# Patient Record
Sex: Female | Born: 1935 | Race: White | Hispanic: No | Smoking: Never smoker
Health system: Southern US, Community
[De-identification: ages and names within clinical notes are randomized; demographics above are authoritative.]

## PROBLEM LIST (undated history)

## (undated) DIAGNOSIS — R32 Unspecified urinary incontinence: Secondary | ICD-10-CM

## (undated) DIAGNOSIS — E039 Hypothyroidism, unspecified: Secondary | ICD-10-CM

## (undated) DIAGNOSIS — S0230XA Fracture of orbital floor, unspecified side, initial encounter for closed fracture: Secondary | ICD-10-CM

## (undated) DIAGNOSIS — T7840XA Allergy, unspecified, initial encounter: Secondary | ICD-10-CM

## (undated) DIAGNOSIS — Z8619 Personal history of other infectious and parasitic diseases: Secondary | ICD-10-CM

## (undated) DIAGNOSIS — M899 Disorder of bone, unspecified: Secondary | ICD-10-CM

## (undated) DIAGNOSIS — R011 Cardiac murmur, unspecified: Secondary | ICD-10-CM

## (undated) DIAGNOSIS — E538 Deficiency of other specified B group vitamins: Secondary | ICD-10-CM

## (undated) DIAGNOSIS — I059 Rheumatic mitral valve disease, unspecified: Secondary | ICD-10-CM

## (undated) DIAGNOSIS — H269 Unspecified cataract: Secondary | ICD-10-CM

## (undated) DIAGNOSIS — E162 Hypoglycemia, unspecified: Secondary | ICD-10-CM

## (undated) DIAGNOSIS — E785 Hyperlipidemia, unspecified: Secondary | ICD-10-CM

## (undated) DIAGNOSIS — F329 Major depressive disorder, single episode, unspecified: Secondary | ICD-10-CM

## (undated) DIAGNOSIS — L409 Psoriasis, unspecified: Secondary | ICD-10-CM

## (undated) DIAGNOSIS — K219 Gastro-esophageal reflux disease without esophagitis: Secondary | ICD-10-CM

## (undated) DIAGNOSIS — D649 Anemia, unspecified: Secondary | ICD-10-CM

## (undated) DIAGNOSIS — G454 Transient global amnesia: Secondary | ICD-10-CM

## (undated) DIAGNOSIS — I749 Embolism and thrombosis of unspecified artery: Secondary | ICD-10-CM

## (undated) DIAGNOSIS — E559 Vitamin D deficiency, unspecified: Secondary | ICD-10-CM

## (undated) DIAGNOSIS — F32A Depression, unspecified: Secondary | ICD-10-CM

## (undated) DIAGNOSIS — I639 Cerebral infarction, unspecified: Secondary | ICD-10-CM

## (undated) DIAGNOSIS — M7989 Other specified soft tissue disorders: Secondary | ICD-10-CM

## (undated) DIAGNOSIS — M1990 Unspecified osteoarthritis, unspecified site: Secondary | ICD-10-CM

## (undated) DIAGNOSIS — M797 Fibromyalgia: Secondary | ICD-10-CM

## (undated) DIAGNOSIS — D126 Benign neoplasm of colon, unspecified: Secondary | ICD-10-CM

## (undated) DIAGNOSIS — Z8719 Personal history of other diseases of the digestive system: Secondary | ICD-10-CM

## (undated) DIAGNOSIS — M949 Disorder of cartilage, unspecified: Secondary | ICD-10-CM

## (undated) DIAGNOSIS — K589 Irritable bowel syndrome without diarrhea: Secondary | ICD-10-CM

## (undated) HISTORY — PX: EYE SURGERY: SHX253

## (undated) HISTORY — DX: Hypothyroidism, unspecified: E03.9

## (undated) HISTORY — DX: Hyperlipidemia, unspecified: E78.5

## (undated) HISTORY — DX: Fibromyalgia: M79.7

## (undated) HISTORY — DX: Fracture of orbital floor, unspecified side, initial encounter for closed fracture: S02.30XA

## (undated) HISTORY — DX: Gastro-esophageal reflux disease without esophagitis: K21.9

## (undated) HISTORY — DX: Unspecified urinary incontinence: R32

## (undated) HISTORY — DX: Embolism and thrombosis of unspecified artery: I74.9

## (undated) HISTORY — DX: Personal history of other infectious and parasitic diseases: Z86.19

## (undated) HISTORY — DX: Vitamin D deficiency, unspecified: E55.9

## (undated) HISTORY — DX: Deficiency of other specified B group vitamins: E53.8

## (undated) HISTORY — PX: TONSILLECTOMY: SUR1361

## (undated) HISTORY — PX: MOUTH SURGERY: SHX715

## (undated) HISTORY — PX: APPENDECTOMY: SHX54

## (undated) HISTORY — DX: Disorder of bone, unspecified: M89.9

## (undated) HISTORY — DX: Disorder of cartilage, unspecified: M94.9

## (undated) HISTORY — PX: TYMPANOPLASTY: SHX33

## (undated) HISTORY — DX: Unspecified cataract: H26.9

## (undated) HISTORY — PX: JOINT REPLACEMENT: SHX530

## (undated) HISTORY — DX: Cardiac murmur, unspecified: R01.1

## (undated) HISTORY — DX: Benign neoplasm of colon, unspecified: D12.6

## (undated) HISTORY — DX: Allergy, unspecified, initial encounter: T78.40XA

## (undated) HISTORY — DX: Unspecified osteoarthritis, unspecified site: M19.90

## (undated) HISTORY — PX: SPINE SURGERY: SHX786

## (undated) HISTORY — DX: Irritable bowel syndrome, unspecified: K58.9

## (undated) HISTORY — PX: CERVICAL DISC SURGERY: SHX588

---

## 1968-11-26 HISTORY — PX: ABDOMINAL HYSTERECTOMY: SHX81

## 1993-11-26 HISTORY — PX: BREAST BIOPSY: SHX20

## 2000-02-15 ENCOUNTER — Encounter: Admission: RE | Admit: 2000-02-15 | Discharge: 2000-02-15 | Payer: Self-pay | Admitting: Family Medicine

## 2000-02-15 ENCOUNTER — Encounter: Payer: Self-pay | Admitting: Family Medicine

## 2000-12-27 HISTORY — PX: COLONOSCOPY: SHX174

## 2001-01-17 ENCOUNTER — Other Ambulatory Visit: Admission: RE | Admit: 2001-01-17 | Discharge: 2001-01-17 | Payer: Self-pay | Admitting: Gastroenterology

## 2001-01-17 ENCOUNTER — Encounter: Payer: Self-pay | Admitting: Gastroenterology

## 2001-01-17 ENCOUNTER — Encounter (INDEPENDENT_AMBULATORY_CARE_PROVIDER_SITE_OTHER): Payer: Self-pay | Admitting: Specialist

## 2001-02-14 ENCOUNTER — Ambulatory Visit (HOSPITAL_COMMUNITY): Admission: RE | Admit: 2001-02-14 | Discharge: 2001-02-14 | Payer: Self-pay | Admitting: Gastroenterology

## 2001-02-14 ENCOUNTER — Encounter: Payer: Self-pay | Admitting: Gastroenterology

## 2001-02-17 ENCOUNTER — Encounter: Admission: RE | Admit: 2001-02-17 | Discharge: 2001-02-17 | Payer: Self-pay | Admitting: Family Medicine

## 2001-02-17 ENCOUNTER — Encounter: Payer: Self-pay | Admitting: Family Medicine

## 2001-08-26 HISTORY — PX: US TRANSVAGINAL PELVIC MODIFIED: HXRAD721

## 2001-09-02 ENCOUNTER — Encounter: Admission: RE | Admit: 2001-09-02 | Discharge: 2001-09-02 | Payer: Self-pay | Admitting: Family Medicine

## 2001-09-02 ENCOUNTER — Encounter: Payer: Self-pay | Admitting: Family Medicine

## 2001-12-25 ENCOUNTER — Other Ambulatory Visit: Admission: RE | Admit: 2001-12-25 | Discharge: 2001-12-25 | Payer: Self-pay | Admitting: Family Medicine

## 2002-01-07 ENCOUNTER — Encounter: Payer: Self-pay | Admitting: Gastroenterology

## 2002-01-20 ENCOUNTER — Encounter: Payer: Self-pay | Admitting: Family Medicine

## 2002-01-20 ENCOUNTER — Encounter: Admission: RE | Admit: 2002-01-20 | Discharge: 2002-01-20 | Payer: Self-pay | Admitting: Family Medicine

## 2002-01-28 ENCOUNTER — Encounter: Payer: Self-pay | Admitting: Gastroenterology

## 2002-01-28 ENCOUNTER — Ambulatory Visit (HOSPITAL_COMMUNITY): Admission: RE | Admit: 2002-01-28 | Discharge: 2002-01-28 | Payer: Self-pay | Admitting: Gastroenterology

## 2002-04-28 ENCOUNTER — Encounter: Admission: RE | Admit: 2002-04-28 | Discharge: 2002-04-28 | Payer: Self-pay | Admitting: Family Medicine

## 2002-04-28 ENCOUNTER — Encounter: Payer: Self-pay | Admitting: Family Medicine

## 2002-10-20 ENCOUNTER — Encounter: Payer: Self-pay | Admitting: Family Medicine

## 2002-10-20 ENCOUNTER — Encounter: Admission: RE | Admit: 2002-10-20 | Discharge: 2002-10-20 | Payer: Self-pay | Admitting: Family Medicine

## 2002-11-26 HISTORY — PX: CHOLECYSTECTOMY: SHX55

## 2003-06-09 ENCOUNTER — Encounter: Payer: Self-pay | Admitting: Family Medicine

## 2003-06-09 ENCOUNTER — Encounter: Admission: RE | Admit: 2003-06-09 | Discharge: 2003-06-09 | Payer: Self-pay | Admitting: Family Medicine

## 2003-07-02 ENCOUNTER — Encounter: Payer: Self-pay | Admitting: Gastroenterology

## 2003-07-02 ENCOUNTER — Ambulatory Visit (HOSPITAL_COMMUNITY): Admission: RE | Admit: 2003-07-02 | Discharge: 2003-07-02 | Payer: Self-pay | Admitting: Gastroenterology

## 2003-07-06 ENCOUNTER — Ambulatory Visit (HOSPITAL_COMMUNITY): Admission: RE | Admit: 2003-07-06 | Discharge: 2003-07-06 | Payer: Self-pay | Admitting: Gastroenterology

## 2003-07-06 ENCOUNTER — Encounter: Payer: Self-pay | Admitting: Gastroenterology

## 2003-07-14 ENCOUNTER — Encounter: Payer: Self-pay | Admitting: General Surgery

## 2003-07-16 ENCOUNTER — Ambulatory Visit (HOSPITAL_COMMUNITY): Admission: RE | Admit: 2003-07-16 | Discharge: 2003-07-17 | Payer: Self-pay | Admitting: General Surgery

## 2003-07-16 ENCOUNTER — Encounter (INDEPENDENT_AMBULATORY_CARE_PROVIDER_SITE_OTHER): Payer: Self-pay | Admitting: *Deleted

## 2003-07-16 ENCOUNTER — Encounter: Payer: Self-pay | Admitting: General Surgery

## 2004-03-26 HISTORY — PX: OTHER SURGICAL HISTORY: SHX169

## 2004-03-27 ENCOUNTER — Observation Stay (HOSPITAL_COMMUNITY): Admission: RE | Admit: 2004-03-27 | Discharge: 2004-03-28 | Payer: Self-pay | Admitting: Urology

## 2004-06-23 ENCOUNTER — Encounter: Admission: RE | Admit: 2004-06-23 | Discharge: 2004-06-23 | Payer: Self-pay | Admitting: Family Medicine

## 2005-01-25 ENCOUNTER — Ambulatory Visit: Payer: Self-pay | Admitting: Family Medicine

## 2005-01-30 ENCOUNTER — Ambulatory Visit: Payer: Self-pay | Admitting: Family Medicine

## 2005-01-30 ENCOUNTER — Encounter: Admission: RE | Admit: 2005-01-30 | Discharge: 2005-01-30 | Payer: Self-pay | Admitting: Family Medicine

## 2005-02-12 ENCOUNTER — Ambulatory Visit (HOSPITAL_COMMUNITY): Admission: RE | Admit: 2005-02-12 | Discharge: 2005-02-12 | Payer: Self-pay | Admitting: Gastroenterology

## 2005-02-13 ENCOUNTER — Ambulatory Visit: Payer: Self-pay | Admitting: Family Medicine

## 2005-02-15 ENCOUNTER — Ambulatory Visit (HOSPITAL_COMMUNITY): Admission: RE | Admit: 2005-02-15 | Discharge: 2005-02-15 | Payer: Self-pay | Admitting: Gastroenterology

## 2005-05-22 ENCOUNTER — Ambulatory Visit: Payer: Self-pay | Admitting: Family Medicine

## 2005-05-25 ENCOUNTER — Ambulatory Visit: Payer: Self-pay | Admitting: Family Medicine

## 2005-06-08 ENCOUNTER — Ambulatory Visit (HOSPITAL_COMMUNITY): Admission: RE | Admit: 2005-06-08 | Discharge: 2005-06-08 | Payer: Self-pay | Admitting: Family Medicine

## 2005-06-08 ENCOUNTER — Ambulatory Visit: Payer: Self-pay | Admitting: Family Medicine

## 2005-07-03 ENCOUNTER — Encounter: Admission: RE | Admit: 2005-07-03 | Discharge: 2005-07-03 | Payer: Self-pay | Admitting: Family Medicine

## 2005-08-10 ENCOUNTER — Ambulatory Visit: Payer: Self-pay | Admitting: Family Medicine

## 2005-08-15 ENCOUNTER — Ambulatory Visit: Payer: Self-pay | Admitting: Family Medicine

## 2005-09-12 ENCOUNTER — Ambulatory Visit: Payer: Self-pay | Admitting: Family Medicine

## 2005-10-26 ENCOUNTER — Ambulatory Visit: Payer: Self-pay | Admitting: Family Medicine

## 2005-11-14 ENCOUNTER — Ambulatory Visit: Payer: Self-pay | Admitting: Family Medicine

## 2006-02-26 ENCOUNTER — Encounter: Payer: Self-pay | Admitting: Family Medicine

## 2006-02-26 ENCOUNTER — Ambulatory Visit: Payer: Self-pay | Admitting: Family Medicine

## 2006-02-26 ENCOUNTER — Other Ambulatory Visit: Admission: RE | Admit: 2006-02-26 | Discharge: 2006-02-26 | Payer: Self-pay | Admitting: Family Medicine

## 2006-04-16 ENCOUNTER — Ambulatory Visit: Payer: Self-pay | Admitting: Family Medicine

## 2006-07-25 ENCOUNTER — Encounter: Admission: RE | Admit: 2006-07-25 | Discharge: 2006-07-25 | Payer: Self-pay | Admitting: Family Medicine

## 2006-09-10 ENCOUNTER — Ambulatory Visit: Payer: Self-pay | Admitting: Family Medicine

## 2007-02-10 ENCOUNTER — Ambulatory Visit: Payer: Self-pay | Admitting: Family Medicine

## 2007-03-17 ENCOUNTER — Ambulatory Visit: Payer: Self-pay | Admitting: Family Medicine

## 2007-03-17 LAB — CONVERTED CEMR LAB: Vit D, 1,25-Dihydroxy: 31 (ref 20–57)

## 2007-03-18 ENCOUNTER — Encounter: Payer: Self-pay | Admitting: Family Medicine

## 2007-03-18 LAB — CONVERTED CEMR LAB
AST: 19 units/L (ref 0–37)
Albumin: 3.8 g/dL (ref 3.5–5.2)
BUN: 16 mg/dL (ref 6–23)
Basophils Absolute: 0 10*3/uL (ref 0.0–0.1)
Blood Glucose, Fasting: 73 mg/dL
Calcium: 8.9 mg/dL (ref 8.4–10.5)
Chloride: 104 meq/L (ref 96–112)
Creatinine, Ser: 0.8 mg/dL (ref 0.4–1.2)
Free T4: 1 ng/dL (ref 0.6–1.6)
GFR calc non Af Amer: 75 mL/min
Glucose, Bld: 73 mg/dL (ref 70–99)
Hemoglobin: 13.5 g/dL (ref 12.0–15.0)
Lymphocytes Relative: 36 % (ref 12.0–46.0)
MCHC: 35.2 g/dL (ref 30.0–36.0)
Monocytes Absolute: 3.4 10*3/uL — ABNORMAL HIGH (ref 0.2–0.7)
Neutrophils Relative %: 48 % (ref 43.0–77.0)
Platelets: 202 10*3/uL (ref 150–400)
Potassium: 4.1 meq/L (ref 3.5–5.1)
RDW: 11.9 % (ref 11.5–14.6)
Triglycerides: 126 mg/dL (ref 0–149)
VLDL: 25 mg/dL (ref 0–40)

## 2007-04-02 ENCOUNTER — Encounter: Payer: Self-pay | Admitting: Family Medicine

## 2007-07-29 ENCOUNTER — Encounter: Admission: RE | Admit: 2007-07-29 | Discharge: 2007-07-29 | Payer: Self-pay | Admitting: Family Medicine

## 2007-07-31 ENCOUNTER — Encounter (INDEPENDENT_AMBULATORY_CARE_PROVIDER_SITE_OTHER): Payer: Self-pay | Admitting: *Deleted

## 2007-08-04 ENCOUNTER — Encounter: Payer: Self-pay | Admitting: Family Medicine

## 2007-08-04 DIAGNOSIS — M1990 Unspecified osteoarthritis, unspecified site: Secondary | ICD-10-CM | POA: Insufficient documentation

## 2007-08-04 DIAGNOSIS — I749 Embolism and thrombosis of unspecified artery: Secondary | ICD-10-CM | POA: Insufficient documentation

## 2007-08-04 DIAGNOSIS — E039 Hypothyroidism, unspecified: Secondary | ICD-10-CM | POA: Insufficient documentation

## 2007-08-04 DIAGNOSIS — J45909 Unspecified asthma, uncomplicated: Secondary | ICD-10-CM | POA: Insufficient documentation

## 2007-08-04 DIAGNOSIS — IMO0001 Reserved for inherently not codable concepts without codable children: Secondary | ICD-10-CM | POA: Insufficient documentation

## 2007-08-04 DIAGNOSIS — R32 Unspecified urinary incontinence: Secondary | ICD-10-CM | POA: Insufficient documentation

## 2007-08-04 DIAGNOSIS — N952 Postmenopausal atrophic vaginitis: Secondary | ICD-10-CM | POA: Insufficient documentation

## 2007-08-04 DIAGNOSIS — E162 Hypoglycemia, unspecified: Secondary | ICD-10-CM | POA: Insufficient documentation

## 2007-08-04 DIAGNOSIS — K589 Irritable bowel syndrome without diarrhea: Secondary | ICD-10-CM | POA: Insufficient documentation

## 2007-08-06 ENCOUNTER — Ambulatory Visit: Payer: Self-pay | Admitting: Family Medicine

## 2007-09-03 ENCOUNTER — Telehealth: Payer: Self-pay | Admitting: Family Medicine

## 2007-10-06 ENCOUNTER — Telehealth: Payer: Self-pay | Admitting: Family Medicine

## 2007-10-10 ENCOUNTER — Ambulatory Visit: Payer: Self-pay | Admitting: Family Medicine

## 2007-10-24 ENCOUNTER — Ambulatory Visit: Payer: Self-pay | Admitting: Family Medicine

## 2007-11-06 ENCOUNTER — Telehealth: Payer: Self-pay | Admitting: Family Medicine

## 2008-01-21 ENCOUNTER — Telehealth: Payer: Self-pay | Admitting: Family Medicine

## 2008-01-22 ENCOUNTER — Telehealth: Payer: Self-pay | Admitting: Family Medicine

## 2008-02-20 ENCOUNTER — Encounter: Payer: Self-pay | Admitting: Family Medicine

## 2008-03-12 ENCOUNTER — Telehealth: Payer: Self-pay | Admitting: Family Medicine

## 2008-04-13 ENCOUNTER — Telehealth: Payer: Self-pay | Admitting: Family Medicine

## 2008-05-12 ENCOUNTER — Telehealth: Payer: Self-pay | Admitting: Family Medicine

## 2008-06-03 ENCOUNTER — Ambulatory Visit: Payer: Self-pay | Admitting: Family Medicine

## 2008-06-03 DIAGNOSIS — Z87898 Personal history of other specified conditions: Secondary | ICD-10-CM | POA: Insufficient documentation

## 2008-06-30 ENCOUNTER — Telehealth (INDEPENDENT_AMBULATORY_CARE_PROVIDER_SITE_OTHER): Payer: Self-pay | Admitting: *Deleted

## 2008-07-05 ENCOUNTER — Telehealth (INDEPENDENT_AMBULATORY_CARE_PROVIDER_SITE_OTHER): Payer: Self-pay | Admitting: *Deleted

## 2008-07-30 ENCOUNTER — Encounter (INDEPENDENT_AMBULATORY_CARE_PROVIDER_SITE_OTHER): Payer: Self-pay | Admitting: *Deleted

## 2008-08-25 ENCOUNTER — Encounter: Payer: Self-pay | Admitting: Family Medicine

## 2008-08-25 ENCOUNTER — Ambulatory Visit: Payer: Self-pay | Admitting: Family Medicine

## 2008-08-25 ENCOUNTER — Other Ambulatory Visit: Admission: RE | Admit: 2008-08-25 | Discharge: 2008-08-25 | Payer: Self-pay | Admitting: Family Medicine

## 2008-08-25 DIAGNOSIS — E785 Hyperlipidemia, unspecified: Secondary | ICD-10-CM | POA: Insufficient documentation

## 2008-08-25 DIAGNOSIS — M810 Age-related osteoporosis without current pathological fracture: Secondary | ICD-10-CM | POA: Insufficient documentation

## 2008-08-25 DIAGNOSIS — Z86010 Personal history of colonic polyps: Secondary | ICD-10-CM | POA: Insufficient documentation

## 2008-08-30 ENCOUNTER — Encounter: Admission: RE | Admit: 2008-08-30 | Discharge: 2008-08-30 | Payer: Self-pay | Admitting: Family Medicine

## 2008-08-31 LAB — CONVERTED CEMR LAB
ALT: 17 units/L (ref 0–35)
Alkaline Phosphatase: 60 units/L (ref 39–117)
Basophils Absolute: 0 10*3/uL (ref 0.0–0.1)
Basophils Relative: 0.4 % (ref 0.0–3.0)
Bilirubin, Direct: 0.1 mg/dL (ref 0.0–0.3)
CO2: 31 meq/L (ref 19–32)
Creatinine, Ser: 0.9 mg/dL (ref 0.4–1.2)
Direct LDL: 161.4 mg/dL
Eosinophils Relative: 1.9 % (ref 0.0–5.0)
GFR calc Af Amer: 79 mL/min
HDL: 57.5 mg/dL (ref >39.0)
Platelets: 197 10*3/uL (ref 150–400)
RBC: 4.07 M/uL (ref 3.87–5.11)
RDW: 11.9 % (ref 11.5–14.6)
Sodium: 143 meq/L (ref 135–145)
TSH: 1.61 microintl units/mL (ref 0.35–5.50)
Total Protein: 7.4 g/dL (ref 6.0–8.3)

## 2008-09-02 ENCOUNTER — Ambulatory Visit: Payer: Self-pay | Admitting: Family Medicine

## 2008-09-06 LAB — CONVERTED CEMR LAB
OCCULT 1: NEGATIVE
OCCULT 2: NEGATIVE
OCCULT 3: NEGATIVE

## 2008-09-21 ENCOUNTER — Encounter: Payer: Self-pay | Admitting: Family Medicine

## 2008-09-23 ENCOUNTER — Telehealth: Payer: Self-pay | Admitting: Family Medicine

## 2008-09-28 ENCOUNTER — Telehealth: Payer: Self-pay | Admitting: Family Medicine

## 2008-10-21 ENCOUNTER — Encounter: Payer: Self-pay | Admitting: Family Medicine

## 2008-10-29 ENCOUNTER — Ambulatory Visit: Payer: Self-pay | Admitting: Internal Medicine

## 2008-11-02 LAB — CONVERTED CEMR LAB
Albumin: 4.1 g/dL (ref 3.5–5.2)
Amylase: 72 units/L (ref 27–131)
Basophils Relative: 0.5 % (ref 0.0–3.0)
Eosinophils Absolute: 0.2 10*3/uL (ref 0.0–0.7)
Eosinophils Relative: 3.5 % (ref 0.0–5.0)
HCT: 38.9 % (ref 36.0–46.0)
Lipase: 28 units/L (ref 11.0–59.0)
Lymphocytes Relative: 41.3 % (ref 12.0–46.0)
MCHC: 34.6 g/dL (ref 30.0–36.0)
Monocytes Absolute: 0.4 10*3/uL (ref 0.1–1.0)
Neutro Abs: 2.9 10*3/uL (ref 1.4–7.7)
RBC: 4.04 M/uL (ref 3.87–5.11)
RDW: 11.7 % (ref 11.5–14.6)
Total Bilirubin: 0.7 mg/dL (ref 0.3–1.2)
Total Protein: 7.2 g/dL (ref 6.0–8.3)
WBC: 6 10*3/uL (ref 4.5–10.5)

## 2008-11-08 ENCOUNTER — Telehealth: Payer: Self-pay | Admitting: Family Medicine

## 2008-11-08 ENCOUNTER — Ambulatory Visit: Payer: Self-pay | Admitting: Family Medicine

## 2008-11-08 DIAGNOSIS — E559 Vitamin D deficiency, unspecified: Secondary | ICD-10-CM | POA: Insufficient documentation

## 2008-11-09 ENCOUNTER — Ambulatory Visit: Payer: Self-pay | Admitting: Gastroenterology

## 2008-11-09 ENCOUNTER — Encounter: Payer: Self-pay | Admitting: Family Medicine

## 2008-11-09 ENCOUNTER — Telehealth: Payer: Self-pay | Admitting: Gastroenterology

## 2008-11-09 ENCOUNTER — Encounter: Payer: Self-pay | Admitting: Gastroenterology

## 2008-11-09 LAB — CONVERTED CEMR LAB
CO2: 32 meq/L (ref 19–32)
Calcium: 8.9 mg/dL (ref 8.4–10.5)
Chloride: 102 meq/L (ref 96–112)

## 2008-11-12 ENCOUNTER — Ambulatory Visit (HOSPITAL_COMMUNITY): Admission: RE | Admit: 2008-11-12 | Discharge: 2008-11-12 | Payer: Self-pay | Admitting: Gastroenterology

## 2008-11-22 ENCOUNTER — Ambulatory Visit: Payer: Self-pay | Admitting: Gastroenterology

## 2008-11-22 ENCOUNTER — Ambulatory Visit (HOSPITAL_COMMUNITY): Admission: RE | Admit: 2008-11-22 | Discharge: 2008-11-22 | Payer: Self-pay | Admitting: Gastroenterology

## 2008-11-25 ENCOUNTER — Telehealth: Payer: Self-pay | Admitting: Family Medicine

## 2008-12-03 ENCOUNTER — Telehealth: Payer: Self-pay | Admitting: Family Medicine

## 2008-12-09 ENCOUNTER — Ambulatory Visit: Payer: Self-pay | Admitting: Gastroenterology

## 2009-02-01 ENCOUNTER — Telehealth: Payer: Self-pay | Admitting: Family Medicine

## 2009-02-14 ENCOUNTER — Encounter: Payer: Self-pay | Admitting: Family Medicine

## 2009-02-15 ENCOUNTER — Telehealth: Payer: Self-pay | Admitting: Family Medicine

## 2009-03-24 ENCOUNTER — Ambulatory Visit (HOSPITAL_BASED_OUTPATIENT_CLINIC_OR_DEPARTMENT_OTHER): Admission: RE | Admit: 2009-03-24 | Discharge: 2009-03-24 | Payer: Self-pay | Admitting: Urology

## 2009-04-06 ENCOUNTER — Encounter: Payer: Self-pay | Admitting: Family Medicine

## 2009-05-04 ENCOUNTER — Telehealth: Payer: Self-pay | Admitting: Family Medicine

## 2009-05-25 ENCOUNTER — Telehealth: Payer: Self-pay | Admitting: Family Medicine

## 2009-07-05 ENCOUNTER — Telehealth: Payer: Self-pay | Admitting: Family Medicine

## 2009-08-10 ENCOUNTER — Encounter: Payer: Self-pay | Admitting: Family Medicine

## 2009-08-29 ENCOUNTER — Ambulatory Visit: Payer: Self-pay | Admitting: Family Medicine

## 2009-08-29 DIAGNOSIS — R5383 Other fatigue: Secondary | ICD-10-CM

## 2009-08-29 DIAGNOSIS — R5381 Other malaise: Secondary | ICD-10-CM | POA: Insufficient documentation

## 2009-09-02 ENCOUNTER — Encounter: Admission: RE | Admit: 2009-09-02 | Discharge: 2009-09-02 | Payer: Self-pay | Admitting: Family Medicine

## 2009-09-02 ENCOUNTER — Encounter (INDEPENDENT_AMBULATORY_CARE_PROVIDER_SITE_OTHER): Payer: Self-pay | Admitting: *Deleted

## 2009-09-02 ENCOUNTER — Encounter: Payer: Self-pay | Admitting: Family Medicine

## 2009-09-02 LAB — CONVERTED CEMR LAB
AST: 20 units/L (ref 0–37)
Albumin: 3.8 g/dL (ref 3.5–5.2)
Basophils Relative: 0.1 % (ref 0.0–3.0)
Bilirubin, Direct: 0.1 mg/dL (ref 0.0–0.3)
CO2: 30 meq/L (ref 19–32)
Calcium: 8.6 mg/dL (ref 8.4–10.5)
Chloride: 109 meq/L (ref 96–112)
Eosinophils Absolute: 0.2 10*3/uL (ref 0.0–0.7)
Eosinophils Relative: 3.6 % (ref 0.0–5.0)
LDL Cholesterol: 106 mg/dL — ABNORMAL HIGH (ref 0–99)
Lymphocytes Relative: 53.2 % — ABNORMAL HIGH (ref 12.0–46.0)
MCHC: 33.1 g/dL (ref 30.0–36.0)
Neutro Abs: 2.3 10*3/uL (ref 1.4–7.7)
Neutrophils Relative %: 37 % — ABNORMAL LOW (ref 43.0–77.0)
Platelets: 167 10*3/uL (ref 150.0–400.0)
RBC: 3.78 M/uL — ABNORMAL LOW (ref 3.87–5.11)
Sodium: 141 meq/L (ref 135–145)
Total Bilirubin: 0.6 mg/dL (ref 0.3–1.2)
Total CHOL/HDL Ratio: 3
VLDL: 14.6 mg/dL (ref 0.0–40.0)
Vitamin B-12: 266 pg/mL (ref 211–911)

## 2009-09-05 ENCOUNTER — Encounter (INDEPENDENT_AMBULATORY_CARE_PROVIDER_SITE_OTHER): Payer: Self-pay | Admitting: *Deleted

## 2009-09-05 ENCOUNTER — Ambulatory Visit: Payer: Self-pay | Admitting: Family Medicine

## 2009-09-05 DIAGNOSIS — E538 Deficiency of other specified B group vitamins: Secondary | ICD-10-CM | POA: Insufficient documentation

## 2009-09-05 LAB — CONVERTED CEMR LAB: Vit D, 25-Hydroxy: 28 ng/mL — ABNORMAL LOW (ref 30–89)

## 2009-09-07 ENCOUNTER — Encounter: Payer: Self-pay | Admitting: Family Medicine

## 2009-09-08 ENCOUNTER — Encounter: Admission: RE | Admit: 2009-09-08 | Discharge: 2009-09-08 | Payer: Self-pay | Admitting: Family Medicine

## 2009-09-12 ENCOUNTER — Ambulatory Visit: Payer: Self-pay | Admitting: Family Medicine

## 2009-09-13 ENCOUNTER — Encounter (INDEPENDENT_AMBULATORY_CARE_PROVIDER_SITE_OTHER): Payer: Self-pay | Admitting: *Deleted

## 2009-09-19 ENCOUNTER — Ambulatory Visit: Payer: Self-pay | Admitting: Family Medicine

## 2009-09-27 ENCOUNTER — Ambulatory Visit: Payer: Self-pay | Admitting: Family Medicine

## 2009-09-29 ENCOUNTER — Encounter: Payer: Self-pay | Admitting: Family Medicine

## 2009-10-14 ENCOUNTER — Ambulatory Visit: Payer: Self-pay | Admitting: Family Medicine

## 2009-10-24 LAB — CONVERTED CEMR LAB: Vitamin B-12: 539 pg/mL (ref 211–911)

## 2009-10-31 ENCOUNTER — Telehealth: Payer: Self-pay | Admitting: Family Medicine

## 2009-11-01 ENCOUNTER — Ambulatory Visit: Payer: Self-pay | Admitting: Family Medicine

## 2009-11-03 ENCOUNTER — Telehealth: Payer: Self-pay | Admitting: Family Medicine

## 2009-11-22 ENCOUNTER — Ambulatory Visit: Payer: Self-pay | Admitting: Family Medicine

## 2009-12-12 ENCOUNTER — Telehealth: Payer: Self-pay | Admitting: Family Medicine

## 2009-12-23 ENCOUNTER — Ambulatory Visit: Payer: Self-pay | Admitting: Family Medicine

## 2010-01-13 ENCOUNTER — Telehealth: Payer: Self-pay | Admitting: Family Medicine

## 2010-01-23 ENCOUNTER — Encounter: Payer: Self-pay | Admitting: Family Medicine

## 2010-01-24 ENCOUNTER — Ambulatory Visit: Payer: Self-pay | Admitting: Family Medicine

## 2010-02-14 ENCOUNTER — Encounter: Payer: Self-pay | Admitting: Family Medicine

## 2010-02-24 ENCOUNTER — Ambulatory Visit: Payer: Self-pay | Admitting: Family Medicine

## 2010-03-06 ENCOUNTER — Telehealth: Payer: Self-pay | Admitting: Family Medicine

## 2010-03-28 ENCOUNTER — Ambulatory Visit: Payer: Self-pay | Admitting: Family Medicine

## 2010-04-12 ENCOUNTER — Ambulatory Visit: Payer: Self-pay | Admitting: Family Medicine

## 2010-04-12 DIAGNOSIS — R634 Abnormal weight loss: Secondary | ICD-10-CM | POA: Insufficient documentation

## 2010-04-13 LAB — CONVERTED CEMR LAB
ALT: 15 units/L (ref 0–35)
AST: 18 units/L (ref 0–37)
Albumin: 4.2 g/dL (ref 3.5–5.2)
Basophils Relative: 1 % (ref 0.0–3.0)
CO2: 30 meq/L (ref 19–32)
Eosinophils Absolute: 0.2 10*3/uL (ref 0.0–0.7)
Eosinophils Relative: 2.9 % (ref 0.0–5.0)
Lymphocytes Relative: 39.1 % (ref 12.0–46.0)
Lymphs Abs: 2.4 10*3/uL (ref 0.7–4.0)
MCHC: 34.4 g/dL (ref 30.0–36.0)
MCV: 98.1 fL (ref 78.0–100.0)
Monocytes Absolute: 0.2 10*3/uL (ref 0.1–1.0)
Monocytes Relative: 3.9 % (ref 3.0–12.0)
Neutro Abs: 3.2 10*3/uL (ref 1.4–7.7)
Neutrophils Relative %: 53.1 % (ref 43.0–77.0)
RBC: 4.07 M/uL (ref 3.87–5.11)
Total Protein: 6.9 g/dL (ref 6.0–8.3)
WBC: 6.1 10*3/uL (ref 4.5–10.5)

## 2010-04-14 ENCOUNTER — Ambulatory Visit: Payer: Self-pay | Admitting: Family Medicine

## 2010-05-09 ENCOUNTER — Ambulatory Visit: Payer: Self-pay | Admitting: Family Medicine

## 2010-05-09 ENCOUNTER — Encounter: Admission: RE | Admit: 2010-05-09 | Discharge: 2010-05-09 | Payer: Self-pay | Admitting: Family Medicine

## 2010-05-11 ENCOUNTER — Telehealth: Payer: Self-pay | Admitting: Family Medicine

## 2010-05-18 ENCOUNTER — Ambulatory Visit: Payer: Self-pay | Admitting: Gastroenterology

## 2010-06-08 ENCOUNTER — Ambulatory Visit: Payer: Self-pay | Admitting: Gastroenterology

## 2010-06-12 ENCOUNTER — Encounter: Payer: Self-pay | Admitting: Gastroenterology

## 2010-06-13 ENCOUNTER — Ambulatory Visit: Payer: Self-pay | Admitting: Family Medicine

## 2010-07-06 ENCOUNTER — Ambulatory Visit: Payer: Self-pay | Admitting: Family Medicine

## 2010-07-14 ENCOUNTER — Ambulatory Visit: Payer: Self-pay | Admitting: Family Medicine

## 2010-07-28 ENCOUNTER — Encounter: Payer: Self-pay | Admitting: Family Medicine

## 2010-08-03 ENCOUNTER — Encounter: Payer: Self-pay | Admitting: Family Medicine

## 2010-08-03 ENCOUNTER — Ambulatory Visit (HOSPITAL_COMMUNITY): Admission: RE | Admit: 2010-08-03 | Discharge: 2010-08-03 | Payer: Self-pay | Admitting: Obstetrics and Gynecology

## 2010-08-14 ENCOUNTER — Telehealth: Payer: Self-pay | Admitting: Family Medicine

## 2010-08-17 ENCOUNTER — Ambulatory Visit: Payer: Self-pay | Admitting: Family Medicine

## 2010-08-22 ENCOUNTER — Ambulatory Visit: Payer: Self-pay | Admitting: Family Medicine

## 2010-09-13 ENCOUNTER — Telehealth (INDEPENDENT_AMBULATORY_CARE_PROVIDER_SITE_OTHER): Payer: Self-pay | Admitting: *Deleted

## 2010-09-13 ENCOUNTER — Ambulatory Visit: Payer: Self-pay | Admitting: Family Medicine

## 2010-09-13 LAB — CONVERTED CEMR LAB
Basophils Absolute: 0 10*3/uL (ref 0.0–0.1)
Basophils Relative: 0.8 % (ref 0.0–3.0)
CO2: 31 meq/L (ref 19–32)
Chloride: 109 meq/L (ref 96–112)
Cholesterol: 193 mg/dL (ref 0–200)
Creatinine, Ser: 0.7 mg/dL (ref 0.4–1.2)
Glucose, Bld: 71 mg/dL (ref 70–99)
HDL: 63.4 mg/dL (ref >39.00)
LDL Cholesterol: 120 mg/dL — ABNORMAL HIGH (ref 0–99)
Lymphocytes Relative: 39.5 % (ref 12.0–46.0)
RDW: 12.8 % (ref 11.5–14.6)
Sodium: 145 meq/L (ref 135–145)
Total CHOL/HDL Ratio: 3
VLDL: 10 mg/dL (ref 0.0–40.0)

## 2010-09-14 LAB — CONVERTED CEMR LAB: Vit D, 25-Hydroxy: 37 ng/mL (ref 30–89)

## 2010-09-20 ENCOUNTER — Ambulatory Visit: Payer: Self-pay | Admitting: Family Medicine

## 2010-09-25 ENCOUNTER — Encounter: Admission: RE | Admit: 2010-09-25 | Discharge: 2010-09-25 | Payer: Self-pay | Admitting: Obstetrics and Gynecology

## 2010-10-09 ENCOUNTER — Telehealth: Payer: Self-pay | Admitting: Family Medicine

## 2010-10-24 ENCOUNTER — Telehealth: Payer: Self-pay | Admitting: Family Medicine

## 2010-11-08 ENCOUNTER — Telehealth: Payer: Self-pay | Admitting: Family Medicine

## 2010-12-17 ENCOUNTER — Encounter: Payer: Self-pay | Admitting: Pulmonary Disease

## 2010-12-18 ENCOUNTER — Telehealth: Payer: Self-pay | Admitting: Family Medicine

## 2010-12-28 NOTE — Assessment & Plan Note (Signed)
Summary: tower b12/rbh  Nurse Visit   Allergies: 1)  ! * Vitamin D 50,000 Iu 2)  * Detrol 3)  Pamelor 4)  Morphine 5)  * Alcohol  Medication Administration  Injection # 1:    Medication: Vit B12 1000 mcg    Diagnosis: B12 DEFICIENCY (ICD-266.2)    Route: IM    Site: L deltoid    Exp Date: 09/27/2011    Lot #: [id number]    Mfr: American Regent    Patient tolerated injection without complications    Given by: Linde Gillis CMA Duncan Dull) (Mar 28, 2010 11:36 AM)  Orders Added: 1)  Vit B12 1000 mcg [J3420] 2)  Admin of Therapeutic Inj  intramuscular or subcutaneous [19147]

## 2010-12-28 NOTE — Procedures (Signed)
Summary: Colonoscopy  Patient: Carla Kelly Note: All result statuses are Final unless otherwise noted.  Tests: (1) Colonoscopy (COL)   COL Colonoscopy           DONE     [facility]     [address]     [place], [place]  [address]           COLONOSCOPY PROCEDURE REPORT     PATIENT:  Carla Kelly, Carla Kelly  MR#:  [id number]     BIRTHDATE:  1933-01-19, 77 yrs. old  GENDER:  female     ENDOSCOPIST:  Judie Petit T. Russella Dar, MD, [facility]           PROCEDURE DATE:  06/08/2010     PROCEDURE:  Colonoscopy with snare polypectomy     ASA CLASS:  Class II     INDICATIONS:  1) hematochezia  2) weight loss  3) follow-up of     polyp, adenomatous polyp, 12/2000.     MEDICATIONS:   Fentanyl 125 mcg IV, Versed 12 mg IV     DESCRIPTION OF PROCEDURE:After the risks benefits and alternatives     of the procedure were thoroughly explained, informed consent was     obtained.  Digital rectal exam was performed and revealed no     abnormalities.  The LB [id number]-[id number] endoscope was introduced     through the anus and advanced to the cecum, which was identified     by both the appendix and ileocecal valve, limited by a tortuous     and redundant colon. Diffcult to reach cecum. he quality of the     prep was excellent, using MoviPrep. The instrument was then slowly     withdrawn as the colon was fully examined.     <<PROCEDUREIMAGES>>     FINDINGS:  A sessile polyp was found in the ascending colon. It     was 5 mm in size. Polyp was snared without cautery. Retrieval was     successful.  Two polyps were found in the proximal transverse     colon. They were 4 - 5 mm in size. Polyps were snared without     cautery. Retrieval was successful. This was otherwise a normal     examination of the colon. Retroflexed views in the rectum revealed     no abnormalities. The time to cecum =  11  minutes. The scope was     then withdrawn (time =  11  min) from the patient and the     procedure completed.        COMPLICATIONS:  None           ENDOSCOPIC IMPRESSION:     1) 5 mm sessile polyp in the ascending colon     2) 4 - 5 mm, two polyps in the proximal transverse colon           RECOMMENDATIONS:     1) Await pathology results     2) Repeat Colonoscopy in 5 years pending pathology review     3) If wt loss persists further evaluated for malabsoption and     nonGI causes of wt loss           Artisha Capri T. Russella Dar, MD, [facility]           CC: Judy Pimple, MD           n.     Rosalie DoctorJudie Petit T. Trygg Mantz at 06/08/2010 02:10 PM  Carla Kelly, Carla Kelly, [id number]  Note: An exclamation mark (!) indicates a result that was not dispersed into the flowsheet. Document Creation Date: 06/08/2010 2:11 PM _______________________________________________________________________  (1) Order result status: Final Collection or observation date-time: 06/08/2010 14:03 Requested date-time:  Receipt date-time:  Reported date-time:  Referring Physician:   Ordering Physician: Claudette Head [phone]) Specimen Source:  Source: Launa Grill Order Number: [id number] Lab site:   Appended Document: Colonoscopy     Procedures Next Due Date:    Colonoscopy: 05/2015

## 2010-12-28 NOTE — Progress Notes (Signed)
Summary: refill request for diazepam  Phone Note Refill Request Message from:  Fax from Pharmacy  Refills Requested: Medication #1:  DIAZEPAM 5 MG  TABS one by mouth three times a day as needed   Last Refilled: 09/30/2010 [facility] [address]  [phone].  Initial call taken by: Lowella Petties CMA, AAMA,  November 08, 2010 10:35 AM  Follow-up for Phone Call        I did 90 with 3 ref on 11/29?   Follow-up by: Judith Part MD,  November 08, 2010 11:22 AM  Additional Follow-up for Phone Call Additional follow up Details #1::        Spoke with Joni Reining at [facility] and they do have refills sent 09/2010.Lewanda Rife LPN  November 08, 2010 11:30 AM

## 2010-12-28 NOTE — Assessment & Plan Note (Signed)
Summary: FLU SHOT/JRR  Nurse Visit   Allergies: 1)  ! Detrol (Tolterodine Tartrate) 2)  ! Vitamin D (Ergocalciferol) (Ergocalciferol) 3)  Pamelor 4)  Morphine 5)  * Alcohol  Immunizations Administered:  Influenza Vaccine # 1:    Vaccine Type: Fluvax MCR    Site: left deltoid    Mfr: GlaxoSmithKline    Dose: 0.5 ml    Route: IM    Given by: Mervin Hack CMA (AAMA)    Exp. Date: 05/26/2011    Lot #: [id number]    VIS given: 06/20/10 version given August 22, 2010.  Flu Vaccine Consent Questions:    Do you have a history of severe allergic reactions to this vaccine? no    Any prior history of allergic reactions to egg and/or gelatin? no    Do you have a sensitivity to the preservative Thimersol? no    Do you have a past history of Guillan-Barre Syndrome? no    Do you currently have an acute febrile illness? no    Have you ever had a severe reaction to latex? no    Vaccine information given and explained to patient? yes    Are you currently pregnant? no  Orders Added: 1)  Influenza Vaccine MCR [00025]

## 2010-12-28 NOTE — Progress Notes (Signed)
Summary: hydrocodone  Phone Note Refill Request Message from:  Fax from Pharmacy on December 18, 2010 10:25 AM  Refills Requested: Medication #1:  HYDROCODONE-ACETAMINOPHEN 10-650 MG  TABS 1 by mouth two times a day as needed pain   Last Refilled: 11/08/2010 [facility] [phone]  Initial call taken by: Melody Comas,  December 18, 2010 10:25 AM  Follow-up for Phone Call        px written on EMR for call in  Follow-up by: Judith Part MD,  December 18, 2010 1:22 PM  Additional Follow-up for Phone Call Additional follow up Details #1::        [facility] as instructed. Lewanda Rife LPN  December 18, 2010 2:05 PM     Prescriptions: HYDROCODONE-ACETAMINOPHEN 10-650 MG  TABS (HYDROCODONE-ACETAMINOPHEN) 1 by mouth two times a day as needed pain  #60 x 0   Entered and Authorized by:   Judith Part MD   Signed by:   Lewanda Rife LPN on 13/24/4010   Method used:   Telephoned to ...       [facility] (retail)       [address]       [place], [place]  [address]       Ph: [phone] or [phone]       Fax: [phone]   RxID:   [id number]

## 2010-12-28 NOTE — Miscellaneous (Signed)
Summary: CONTROLLED SUBSTANCES CONTRACT  CONTROLLED [employer]ANCES CONTRACT   Imported By: Wilder Glade 07/12/2010 12:45:10  _____________________________________________________________________  External Attachment:    Type:   Image     Comment:   External Document

## 2010-12-28 NOTE — Letter (Signed)
Summary: Patient Notice- Polyp Results  [facility]  [address] [place], [place] [address]   Phone: [phone]  Fax: [phone]        June 12, 2010 MRN: [id number]    Carla Kelly [address] [place], [place]  [address]    Dear Ms. Hedglin,  I am pleased to inform you that the colon polyp(s) removed during your recent colonoscopy was (were) found to be benign (no cancer detected) upon pathologic examination.  I recommend you have a repeat colonoscopy examination in 5 years to look for recurrent polyps, as having colon polyps increases your risk for having recurrent polyps or even colon cancer in the future.  Should you develop new or worsening symptoms of abdominal pain, bowel habit changes or bleeding from the rectum or bowels, please schedule an evaluation with either your primary care physician or with me.  Continue treatment plan as outlined the day of your exam.  Please call us if you are having persistent problems or have questions about your condition that have not been fully answered at this time.  Sincerely,  Meryl Dare MD [facility]  This letter has been electronically signed by your physician.  Appended Document: Patient Notice- Polyp Results letter mailed.

## 2010-12-28 NOTE — Letter (Signed)
Summary: [facility]   Imported By: Lanelle Bal 02/28/2010 13:50:31  _____________________________________________________________________  External Attachment:    Type:   Image     Comment:   External Document

## 2010-12-28 NOTE — Progress Notes (Signed)
Summary: refill request for diazepam  Phone Note Refill Request Message from:  Fax from Pharmacy  Refills Requested: Medication #1:  DIAZEPAM 5 MG  TABS one by mouth three times a day prn   Last Refilled: 12/16/2009 [facility]axed request from [facility] [address], [phone].  Initial call taken by: Lowella Petties CMA,  March 06, 2010 8:22 AM  Follow-up for Phone Call        px written on EMR for call in  Follow-up by: Judith Part MD,  March 06, 2010 9:35 AM  Additional Follow-up for Phone Call Additional follow up Details #1::        Medication [facility]oned [facility]  [facility] as instructed. Lewanda Rife LPN  March 06, 2010 3:54 PM     New/Updated Medications: DIAZEPAM 5 MG  TABS (DIAZEPAM) one by mouth three times a day as needed Prescriptions: DIAZEPAM 5 MG  TABS (DIAZEPAM) one by mouth three times a day as needed  #90 x 3   Entered and Authorized by:   Judith Part MD   Signed by:   Lewanda Rife LPN on 56/21/3086   Method used:   Telephoned to ...       [facility]  [place] (retail)       [address]       [place], [place]  [address]       Ph: [phone] or [phone]       Fax: [phone]   RxID:   [id number]

## 2010-12-28 NOTE — Progress Notes (Signed)
Summary: refill request for albuterol  Phone Note Refill Request Message from:  Fax from Pharmacy  Refills Requested: Medication #1:  ALBUTEROL SULFATE 4 MG TABS 1 by mouth q 4 hours as needed   Last Refilled: 06/17/2009 Faxed request from [facility] [address].  Initial call taken by: Lowella Petties CMA,  December 12, 2009 3:22 PM  Follow-up for Phone Call        px written on EMR for call in  Follow-up by: Judith Part MD,  December 12, 2009 3:35 PM    New/Updated Medications: ALBUTEROL SULFATE 4 MG TABS (ALBUTEROL SULFATE) 1 by mouth q 4 hours as needed Prescriptions: ALBUTEROL SULFATE 4 MG TABS (ALBUTEROL SULFATE) 1 by mouth q 4 hours as needed  #120 x 5   Entered by:   Lowella Petties CMA   Authorized by:   Judith Part MD   Signed by:   Lowella Petties CMA on 12/12/2009   Method used:   Electronically to        [facility]  [place] (retail)       [address]       [place], [place]  [address]       Ph: [phone] or [phone]       Fax: [phone]   RxID:   [id number] ALBUTEROL SULFATE 4 MG TABS (ALBUTEROL SULFATE) 1 by mouth q 4 hours as needed  #120 x 5   Entered and Authorized by:   Judith Part MD   Signed by:   Judith Part MD on 12/12/2009   Method used:   Telephoned to ...       [facility]  [place] (retail)       [address]       [place], [place]  [address]       Ph: [phone] or [phone]       Fax: [phone]   RxID:   [id number]

## 2010-12-28 NOTE — Letter (Signed)
Summary: [facility] Instructions  [facility]  [address] [place], [place] [address]   Phone: [phone]  Fax: [phone]       Carla Kelly    1932/12/27    MRN: [id number]        Procedure Day /Date: Thursday July 14th, 2011     Arrival Time: 12:30pm     Procedure Time: 1:30pm     Location of Procedure:                    _x _   [facility]oscopy Center (4th Floor)                        PREPARATION FOR COLONOSCOPY WITH MOVIPREP   Starting 5 days prior to your procedure 06/03/10 do not eat nuts, seeds, popcorn, corn, beans, peas,  salads, or any raw vegetables.  Do not take any fiber supplements (e.g. Metamucil, Citrucel, and Benefiber).  THE DAY BEFORE YOUR PROCEDURE         DATE: 06/07/10  DAY: Wednesday  1.  Drink clear liquids the entire day-NO SOLID FOOD  2.  Do not drink anything colored red or purple.  Avoid juices with pulp.  No orange juice.  3.  Drink at least 64 oz. (8 glasses) of fluid/clear liquids during the day to prevent dehydration and help the prep work efficiently.  CLEAR LIQUIDS INCLUDE: Water Jello Ice Popsicles Tea (sugar ok, no milk/cream) Powdered fruit flavored drinks Coffee (sugar ok, no milk/cream) Gatorade Juice: apple, white grape, white cranberry  Lemonade Clear bullion, consomm, broth Carbonated beverages (any kind) Strained chicken noodle soup Hard Candy                             4.  In the morning, mix first dose of MoviPrep solution:    Empty 1 Pouch A and 1 Pouch B into the disposable container    Add lukewarm drinking water to the top line of the container. Mix to dissolve    Refrigerate (mixed solution should be used within 24 hrs)  5.  Begin drinking the prep at 5:00 p.m. The MoviPrep container is divided by 4 marks.   Every 15 minutes drink the solution down to the next mark (approximately 8 oz) until the full liter is complete.   6.  Follow completed prep with 16 oz of clear liquid of your choice  (Nothing red or purple).  Continue to drink clear liquids until bedtime.  7.  Before going to bed, mix second dose of MoviPrep solution:    Empty 1 Pouch A and 1 Pouch B into the disposable container    Add lukewarm drinking water to the top line of the container. Mix to dissolve    Refrigerate  THE DAY OF YOUR PROCEDURE      DATE: 06/08/10 DAY: Thursday  Beginning at 8:30 a.m. (5 hours before procedure):         1. Every 15 minutes, drink the solution down to the next mark (approx 8 oz) until the full liter is complete.  2. Follow completed prep with 16 oz. of clear liquid of your choice.    3. You may drink clear liquids until 11:30am (2 HOURS BEFORE PROCEDURE).   MEDICATION INSTRUCTIONS  Unless otherwise instructed, you should take regular prescription medications with a small sip of water   as early as possible the morning of your  procedure.         OTHER INSTRUCTIONS  You will need a responsible adult at least 75 years of age to accompany you and drive you home.   This person must remain in the waiting room during your procedure.  Wear loose fitting clothing that is easily removed.  Leave jewelry and other valuables at home.  However, you may wish to bring a book to read or  an iPod/MP3 player to listen to music as you wait for your procedure to start.  Remove all body piercing jewelry and leave at home.  Total time from sign-in until discharge is approximately 2-3 hours.  You should go home directly after your procedure and rest.  You can resume normal activities the  day after your procedure.  The day of your procedure you should not:   Drive   Make legal decisions   Operate machinery   Drink alcohol   Return to work  You will receive specific instructions about eating, activities and medications before you leave.    The above instructions have been reviewed and explained to me by   Estill Bamberg.     I fully understand and can verbalize these  instructions _____________________________ Date _________

## 2010-12-28 NOTE — Assessment & Plan Note (Signed)
Summary: B-12/TOWER/JRR  Nurse Visit   Allergies: 1)  ! Detrol (Tolterodine Tartrate) 2)  ! Vitamin D (Ergocalciferol) (Ergocalciferol) 3)  Pamelor 4)  Morphine 5)  * Alcohol  Medication Administration  Injection # 1:    Medication: Vit B12 1000 mcg    Diagnosis: B12 DEFICIENCY (ICD-266.2)    Route: IM    Site: L deltoid    Exp Date: 02/25/2012    Lot #: [id number]    Mfr: American Regent    Patient tolerated injection without complications    Given by: Lewanda Rife LPN (August 17, 2010 3:11 PM)  Orders Added: 1)  Admin of Therapeutic Inj  intramuscular or subcutaneous [96372] 2)  Vit B12 1000 mcg [J3420]

## 2010-12-28 NOTE — Assessment & Plan Note (Signed)
Summary: Gastroenterology  [place]  MR#:  [id number] Page #  [facility]   GASTROENTEROLOGY OFFICE NOTE  NAME:  Carla Kelly, Carla Kelly   OFFICE NO:  [id number]  DATE:  01/07/02   REQUESTING PHYSICIAN:  Dr. Roxy Manns  CHIEF COMPLAINT:  The patient is a 77 year old white female that I have previously seen for colonoscopy and colon polyps.  She did have a history of diarrhea, abdominal pain and bloating.  A descending colon polyp was removed.  Her colonoscopy was only complete to the proximal transverse colon.  Subsequent barium enema revealed a question of a polyp in the proximal transverse colon.  It was described as a 5 mm filling defect with features consistent with either a polyp or retained stool.  The remainder of the exam was felt to be unremarkable except for moderate redundancy of the colon and a high-riding cecum that extended across the midline to the left.  Some reflux into the terminal ileum was noted.  She relates the onset of right lower back pain and right inguinal region pain beginning after lifting her husband up from a fall in late December.  She has been using pain medications and heating pads with some improvement in her symptoms.  Her left lower back pain has improved more than her right lower quadrant pain.  There has been no change in bowel habits.  The pain is not related to any digestive function.  She specifically notes no melena, hematochezia, weight loss or change in stool caliber.   MEDICATIONS:  As listed on the chart, updated and reviewed.  PAST MEDICAL HISTORY:  Status post abdominal hysterectomy with a right oophorectomy.  Her left ovary remains.  History of irritable bowel syndrome.  Status post appendectomy at age 13.  Hypothyroidism.  History of depression.  History of asthma.    PHYSICAL EXAMINATION:  GENERAL:  In no acute distress.  VITAL SIGNS:  Weight 162 pounds.  Blood pressure 124/74.  Pulse 72 and regular.  HEENT:  Anicteric sclerae.  Oropharynx clear.  CHEST:   Clear to auscultation and percussion.  CARDIAC:  Regular rate and rhythm without murmurs.  ABDOMEN:  Soft with minimal right lower quadrant tenderness that extends into the right inguinal region.  The tenderness is located specifically at the appendectomy incision.  No hernias are appreciated.  Normoactive bowel sounds.  No palpable organomegaly or masses.  RECTAL:  Examination deferred.  BACK:  Exam reveals tenderness in the right lumbar paraspinal region.  No costovertebral angle tenderness.  ASSESSMENT/PLAN:  1.   Prior history of an adenomatous colon polyp.  Possible polyp in the proximal transverse colon noted on barium enema.  Given her prior difficulties with colonoscopy likely due to her redundant colon, we will proceed with an air contrasted barium enema.  If the filling defect persists, will need colonoscopy.   2.   Right-sided back pain and right lower quadrant abdominal pain with right inguinal pain.  These symptoms appear to be musculoskeletal in etiology and I defer further treatment to Dr. Milinda Antis.     Venita Lick. Russella Dar, M.D., F.A.C.G.  [id number] cc:  Dr. Milinda Antis D:  01/07/02; T:  ; Job # [id number]

## 2010-12-28 NOTE — Progress Notes (Signed)
----   Converted from flag ---- ---- 09/12/2010 9:51 PM, Judith Part MD wrote: please check lipid/ast/alt/renal/ cbc with diff/ tsh / vit d for 272, 244.9, gerd   ---- 09/12/2010 11:37 AM, Liane Comber CMA (AAMA) wrote: Lab orders please! Good Morning! This pt is scheduled for cpx labs tomorrow, which labs to draw and dx codes to use? Thanks Tasha ------------------------------

## 2010-12-28 NOTE — Consult Note (Signed)
Summary: [facility]   Imported By: Sherian Rein 08/12/2010 08:41:53  _____________________________________________________________________  External Attachment:    Type:   Image     Comment:   External Document

## 2010-12-28 NOTE — Assessment & Plan Note (Signed)
Summary: CPX/JRR   Vital Signs:  Patient profile:   77 year old female Height:      66 inches Weight:      135.50 pounds BMI:     21.95 Temp:     97.9 degrees F oral Pulse rate:   80 / minute Pulse rhythm:   regular BP sitting:   116 / 60  (left arm) Cuff size:   regular  Vitals Entered By: Lewanda Rife LPN (September 20, 2010 10:36 AM) CC: check up    History of Present Illness: here for check up of chronic med problems and also to disc health mt list   worse asthma this week  environmental and weather   wt is stable  good bp 116/60  thyroid-- tsh is theraputic feels like that is just fine  is on generic for synthroid   lipids up a bit with good hdl and LDL 120 from 106 last year  has been eating too much ice cream and eggs to try to gain weight  is gaining at home   partial hyst past pap 09 nl  saw Dr Vincente Poli to check ovary (did some pancreas tests? ) -- and they were fine  no symptoms and not sexually active  no more ovarian cysts    osteopenia stable 10/10 dexa ca and D-- has missed her vit D lately - since she is traveling  D level 37 now -- improving    colonosc 7/11- will have 5 y f/u for polyps  mam 10/10- has it already scheduled monday  self exam - no lumps  Td 04  flu shot utd ptx 09    Allergies: 1)  ! Detrol (Tolterodine Tartrate) 2)  ! Vitamin D (Ergocalciferol) (Ergocalciferol) 3)  Pamelor 4)  Morphine 5)  * Alcohol  Past History:  Past Medical History: Last updated: 05/18/2010 Asthma Hypothyroidism Osteoarthritis IBS fibromyalgia hx of herpes zoster  hyperlipidemia  Adenomatous Colon Polyps 12/2000  GYN -- Grewal Urol-- Patsi Sears  ortho- Magnus Ivan  gastro--[facility]  Past Surgical History: Last updated: 12/09/2008 Hysterectomy- fibroid, endometriosis (1970) 2 cervical disk repairs Colonoscopy- polyps, BE- polyps (12/2000) Recurrent shingles Pelvic US- small left ovarian cysts (08/2001) Dexa- normal (1999),  slight hip  osteopenia (12/2001) Dexa- no significant change (05/2003),  osteopenia (06/2005) Gallstones (06/2003),  Abd [place]- neg (06/2003) Cholecystectomy (07/2003) Bladder tack up (03/2004) Dexa- osteopenia- stable (06/2007) CT abd/pelvis 12/09-  mild biliary dilatation  Family History: Last updated: 12-05-2008 Father: deceased age 30- gunshot wound Mother: deceased- MI, several strokes Siblings: several- 1 brother with lupus, sister with benign brain tumor daughter asthma  brother with heart problems  Family History of Colon Cancer:1st cousin  Social History: Last updated: 08/29/2009 Marital Status: widowed Children: 4 Occupation: retired non smoker avoids alcohol- is allergic to it  Patient gets regular exercise. granson lives with her  Risk Factors: Exercise: yes (2008/12/05)  Risk Factors: Smoking Status: never (08/04/2007)  Review of Systems General:  Complains of fatigue; denies loss of appetite, malaise, and weight loss; from busy schedule  eating more so stopped loosing wt . Eyes:  Denies blurring and eye irritation. Resp:  Complains of cough and wheezing; denies pleuritic, shortness of breath, and sputum productive. GI:  Denies abdominal pain, bloody stools, and change in bowel habits. GU:  Denies dysuria and urinary frequency. MS:  Complains of joint pain; denies joint redness and joint swelling; knees and hip are bothering her with arthritis - shots i the past. Derm:  Denies itching, lesion(s),  poor wound healing, and rash. Neuro:  Denies numbness and tingling. Psych:  always very stressed with her family. Endo:  Denies cold intolerance, excessive thirst, excessive urination, and heat intolerance. Heme:  Denies abnormal bruising and bleeding.  Physical Exam  General:  Well-developed,well-nourished,in no acute distress; alert,appropriate and cooperative throughout examination Head:  normocephalic, atraumatic, and no abnormalities observed.   Eyes:  vision grossly  intact, pupils equal, pupils round, and pupils reactive to light.  no conjunctival pallor, injection or icterus  Ears:  R ear normal and L ear normal.   Nose:  no nasal discharge.   Mouth:  pharynx pink and moist.   Neck:  supple with full rom and no masses or thyromegally, no JVD or carotid bruit  Chest Wall:  No deformities, masses, or tenderness noted. Breasts:  No mass, nodules, thickening, tenderness, bulging, retraction, inflamation, nipple discharge or skin changes noted.   Lungs:  Normal respiratory effort, chest expands symmetrically. Lungs are clear to auscultation, no crackles or wheezes. no rhonchi or wheeze on forced exp pt is coughing a bit and clearing her throat  Heart:  Regular rate and rhythm; no murmurs, rubs,  or bruits. Abdomen:  Bowel sounds positive,abdomen soft and non-tender without masses, organomegaly or hernias noted. no renal bruits  Msk:  No deformity or scoliosis noted of thoracic or lumbar spine.   Pulses:  R and L carotid,radial,femoral,dorsalis pedis and posterior tibial pulses are full and equal bilaterally Extremities:  No clubbing, cyanosis, edema, or deformity noted with normal full range of motion of all joints.   Neurologic:  sensation intact to light touch, gait normal, and DTRs symmetrical and normal.  no tremor Skin:  Intact without suspicious lesions or rashes Cervical Nodes:  No lymphadenopathy noted Axillary Nodes:  No palpable lymphadenopathy Inguinal Nodes:  No significant adenopathy Psych:  normal affect, talkative and pleasant    Impression & Recommendations:  Problem # 1:  OTHER SCREENING MAMMOGRAM (ICD-V76.12) Assessment Comment Only pt has sched monday exam done- no abnormalities   Problem # 2:  UNSPECIFIED VITAMIN D DEFICIENCY (ICD-268.9) Assessment: Unchanged D is better- reminded not to skip doses even when traveling disc imp to bone and overall health  Problem # 3:  HYPOTHYROIDISM (ICD-244.9) Assessment: Unchanged  no  clinical changes (wt is normalizing) and tsh is theraputic no changes  Her updated medication list for this problem includes:    Synthroid 88 Mcg Tabs (Levothyroxine sodium) ..... One by mouth once daily  Labs Reviewed: TSH: 1.74 (09/13/2010)    HgBA1c: 5.4 (04/12/2010) Chol: 193 (09/13/2010)   HDL: 63.40 (09/13/2010)   LDL: 120 (09/13/2010)   TG: 50.0 (09/13/2010)  Orders: Prescription Created Electronically [phone])  Problem # 4:  OSTEOPENIA (ICD-733.90) Assessment: Unchanged this was stable last year  disc ca and D and exercise re check dexa in a year  Her updated medication list for this problem includes:    Calcium Carbonate 1500 Mg Tabs (Calcium carbonate) .Marland Kitchen... Take 1 tablet by mouth once a day  Problem # 5:  HYPERLIPIDEMIA (ICD-272.4) Assessment: Deteriorated  lipids are up wit effort to inc calories  disc limiting sat fats  will continue to monitor rev labs with pt   Labs Reviewed: SGOT: 16 (09/13/2010)   SGPT: 9 (09/13/2010)   HDL:63.40 (09/13/2010), 52.80 (08/29/2009)  LDL:120 (09/13/2010), 106 (08/29/2009)  Chol:193 (09/13/2010), 173 (08/29/2009)  Trig:50.0 (09/13/2010), 73.0 (08/29/2009)  Problem # 6:  ASTHMA (ICD-493.90) Assessment: Unchanged nl exam today may be coming down with a cold -  some post nasal drip will monitor utd meds Her updated medication list for this problem includes:    Albuterol Sulfate 4 Mg Tabs (Albuterol sulfate) .Marland Kitchen... 1 by mouth q 4 hours as needed    Ventolin Hfa 108 (90 Base) Mcg/act Aers (Albuterol sulfate) .Marland Kitchen... 2 puffs q 4 hours as needed  Complete Medication List: 1)  Albuterol Sulfate 4 Mg Tabs (Albuterol sulfate) .Marland Kitchen.. 1 by mouth q 4 hours as needed 2)  Synthroid 88 Mcg Tabs (Levothyroxine sodium) .... One by mouth once daily 3)  Diazepam 5 Mg Tabs (Diazepam) .... One by mouth three times a day as needed 4)  Hydrocodone-acetaminophen 10-650 Mg Tabs (Hydrocodone-acetaminophen) .Marland Kitchen.. 1 by mouth two times a day as needed pain 5)   Ventolin Hfa 108 (90 Base) Mcg/act Aers (Albuterol sulfate) .... 2 puffs q 4 hours as needed 6)  Calcium Carbonate 1500 Mg Tabs (Calcium carbonate) .... Take 1 tablet by mouth once a day 7)  Lasix 40 Mg Tabs (Furosemide) .Marland Kitchen.. 1 by mouth once daily as needed 8)  Multivitamins Tabs (Multiple vitamin) .... Otc as directed. 9)  Cyanocobalamin 1000 Mcg/ml Soln (Cyanocobalamin) .... Inject 1 ml once monthly 10)  Aspirin 325 Mg Tabs (Aspirin) .... One tablet by mouth daily as needed  Other Orders: Vit B12 1000 mcg (J3420) Admin of Therapeutic Inj  intramuscular or subcutaneous ([address])  Patient Instructions: 1)  try to work on healthy habits  2)  good job with weight gain - keep up your calories  3)  I hope your stress improves soon  4)  no change in medicines  5)  get your mammogram as planned  Prescriptions: HYDROCODONE-ACETAMINOPHEN 10-650 MG  TABS (HYDROCODONE-ACETAMINOPHEN) 1 by mouth two times a day as needed pain  #60 x 0   Entered and Authorized by:   Judith Part MD   Signed by:   Judith Part MD on 09/20/2010   Method used:   Print then Give to Patient   RxID:   [id number] SYNTHROID 88 MCG  TABS (LEVOTHYROXINE SODIUM) one by mouth once daily  #30 x 11   Entered and Authorized by:   Judith Part MD   Signed by:   Judith Part MD on 09/20/2010   Method used:   [facility]        [facility]  [place] [id number]       [address]       [place], [place]  [address]       Ph: [phone] or [phone]       Fax: [phone]   RxID:   [id number]    Medication Administration  Injection # 1:    Medication: Vit B12 1000 mcg    Diagnosis: B12 DEFICIENCY (ICD-266.2)    Route: IM    Site: R deltoid    Exp Date: 02/25/2012    Lot #: [id number]    Mfr: American Regent    Patient tolerated injection without complications    Given by: Lewanda Rife LPN (September 20, 2010 10:44 AM)  Orders Added: 1)  Vit B12 1000 mcg [J3420] 2)  Admin of Therapeutic Inj   intramuscular or subcutaneous [[address]] 3)  Est. Patient Level IV [33295] 4)  Prescription Created Electronically [J8841]    Current Allergies (reviewed today): ! DETROL (TOLTERODINE TARTRATE) ! VITAMIN D (ERGOCALCIFEROL) (ERGOCALCIFEROL) PAMELOR MORPHINE * ALCOHOL

## 2010-12-28 NOTE — Letter (Signed)
Summary: [name]: Immunoassay Fecal Occult Blood (iFOB) Order Form  [facility]  [address] [place], [place] [address]   Phone: [phone]  Fax: [phone]      [facility]: Immunoassay Fecal Occult Blood (iFOB) Order Form   Apr 12, 2010 MRN: [id number]   Carla Kelly 24-Nov-1933   Physicican Name:_____Tower ____________________  Diagnosis Code:____________V76.51______________      Judith Part MD

## 2010-12-28 NOTE — Progress Notes (Signed)
Summary: vicodin  Phone Note Refill Request Call back at Home Phone [phone] Message from:  Scriptline on August 14, 2010 8:46 AM  Refills Requested: Medication #1:  HYDROCODONE-ACETAMINOPHEN 10-650 MG  TABS 1 by mouth two times a day as needed pain   Supply Requested: 1 month   [facility]: [facility] [place] [phone]   Method Requested: Telephone to Pharmacy  Follow-up for Phone Call        px written on EMR for call in  Follow-up by: Judith Part MD,  August 14, 2010 10:14 AM    New/Updated Medications: HYDROCODONE-ACETAMINOPHEN 10-650 MG  TABS (HYDROCODONE-ACETAMINOPHEN) 1 by mouth two times a day as needed pain Prescriptions: HYDROCODONE-ACETAMINOPHEN 10-650 MG  TABS (HYDROCODONE-ACETAMINOPHEN) 1 by mouth two times a day as needed pain  #60 x 0   Entered and Authorized by:   Judith Part MD   Signed by:   Benny Lennert CMA (AAMA) on 08/14/2010   Method used:   Telephoned to ...       [facility]  [place]       [address]       [place], [place]  [address]       Ph: [phone] or [phone]       Fax: [phone]   RxID:   [id number]   Appended Document: vicodin rx called to pharmacy.Consuello Masse CMA

## 2010-12-28 NOTE — Assessment & Plan Note (Signed)
Summary: TOWER B12/RBH  Nurse Visit   Allergies: 1)  ! * Vitamin D 50,000 Iu 2)  * Detrol 3)  Pamelor 4)  Morphine 5)  * Alcohol  Medication Administration  Injection # 1:    Medication: Vit B12 1000 mcg    Diagnosis: B12 DEFICIENCY (ICD-266.2)    Route: IM    Site: R deltoid    Exp Date: 09/27/2011    Lot #: [id number]    Mfr: American Regent    Patient tolerated injection without complications    Given by: Lewanda Rife LPN (February 24, 5642 10:59 AM)  Orders Added: 1)  Admin of Therapeutic Inj  intramuscular or subcutaneous [[address]] 2)  Vit B12 1000 mcg [J3420]

## 2010-12-28 NOTE — Progress Notes (Signed)
Summary: hydrocodone  Phone Note Refill Request Message from:  Fax from Pharmacy on November 08, 2010 2:47 PM  Refills Requested: Medication #1:  HYDROCODONE-ACETAMINOPHEN 10-650 MG  TABS 1 by mouth two times a day as needed pain   Last Refilled: 09/30/2010 [facility] [phone].   Initial call taken by: Melody Comas,  November 08, 2010 2:48 PM  Follow-up for Phone Call        px written on EMR for call in  Follow-up by: Judith Part MD,  November 08, 2010 3:36 PM  Additional Follow-up for Phone Call Additional follow up Details #1::        [facility] as instructed. Lewanda Rife LPN  November 08, 2010 4:47 PM     New/Updated Medications: HYDROCODONE-ACETAMINOPHEN 10-650 MG  TABS (HYDROCODONE-ACETAMINOPHEN) 1 by mouth two times a day as needed pain Prescriptions: HYDROCODONE-ACETAMINOPHEN 10-650 MG  TABS (HYDROCODONE-ACETAMINOPHEN) 1 by mouth two times a day as needed pain  #60 x 0   Entered and Authorized by:   Judith Part MD   Signed by:   Lewanda Rife LPN on 69/62/9528   Method used:   Telephoned to ...       [facility]  [place]ail[facility]       [address]       [place], [place]  [address]       Ph: [phone] or [phone]       Fax: [phone]   RxID:   [id number]

## 2010-12-28 NOTE — Assessment & Plan Note (Signed)
Summary: DISCUSS WEIGHT LOSS   Vital Signs:  Patient profile:   77 year old female Height:      66 inches Weight:      134 pounds BMI:     21.71 Temp:     98.1 degrees F oral Pulse rate:   76 / minute Pulse rhythm:   regular BP sitting:   104 / 70  (left arm) Cuff size:   regular  Vitals Entered By: Lewanda Rife LPN (July 06, 2010 10:26 AM) CC: discuss weight loss   History of Present Illness: is here to continue disc of wt loss (see visit from may) wt is down 1 more lb since june (4 lb since may)  cxr neg stool card neg colonosc found several adenomatous polyps with f/u rec in 5 years  labs were all ok    hip is still hurting - spinal inj helped some - will be getting   cat died at 22 years old -- last week  ? feline leukemia   is eating more and more and drinking protien drinks too  stopped fish oil because she eats more fish  is no more active  got a little anxious when her cat died   GI doc per pt did not think she had malabsorbtion problems  no other symptoms at all   still has L ovary-- pain in that area looks like from her back  not on any estrogen at all at this point  has been over a year since she saw her   nl mam last oct   pt has fam hx of heart dz no cardiac symptoms at all- but she is worried that she needs eval at some point wants to figure out the wt loss first       Allergies: 1)  ! Detrol (Tolterodine Tartrate) 2)  ! Vitamin D (Ergocalciferol) (Ergocalciferol) 3)  Pamelor 4)  Morphine 5)  * Alcohol  Past History:  Past Medical History: Last updated: 05/18/2010 Asthma Hypothyroidism Osteoarthritis IBS fibromyalgia hx of herpes zoster  hyperlipidemia  Adenomatous Colon Polyps 12/2000  GYN -- Grewal Urol-- Patsi Sears  ortho- Magnus Ivan  gastro--[facility]  Past Surgical History: Last updated: 12/09/2008 Hysterectomy- fibroid, endometriosis (1970) 2 cervical disk repairs Colonoscopy- polyps, BE- polyps (12/2000) Recurrent  shingles Pelvic US- small left ovarian cysts (08/2001) Dexa- normal (1999),  slight hip osteopenia (12/2001) Dexa- no significant change (05/2003),  osteopenia (06/2005) Gallstones (06/2003),  Abd [place]- neg (06/2003) Cholecystectomy (07/2003) Bladder tack up (03/2004) Dexa- osteopenia- stable (06/2007) CT abd/pelvis 12/09-  mild biliary dilatation  Family History: Last updated: December 06, 2008 Father: deceased age 10- gunshot wound Mother: deceased- MI, several strokes Siblings: several- 1 brother with lupus, sister with benign brain tumor daughter asthma  brother with heart problems  Family History of Colon Cancer:1st cousin  Social History: Last updated: 08/29/2009 Marital Status: widowed Children: 4 Occupation: retired non smoker avoids alcohol- is allergic to it  Patient gets regular exercise. granson lives with her  Risk Factors: Exercise: yes (2008/12/06)  Risk Factors: Smoking Status: never (08/04/2007)  Review of Systems General:  Denies chills, fever, loss of appetite, and malaise. Eyes:  Denies blurring and eye irritation. CV:  Denies chest pain or discomfort and lightheadness. Resp:  Denies cough, shortness of breath, and wheezing. GI:  Denies abdominal pain, change in bowel habits, and indigestion. GU:  Denies abnormal vaginal bleeding, discharge, dysuria, hematuria, and urinary frequency. MS:  Complains of joint pain and stiffness; denies joint redness, joint swelling, cramps,  and muscle weakness. Derm:  Denies lesion(s), poor wound healing, and rash. Neuro:  Denies numbness and tingling. Psych:  Denies anxiety and depression. Endo:  Denies cold intolerance, excessive thirst, excessive urination, and heat intolerance. Heme:  Denies abnormal bruising and bleeding.  Physical Exam  General:  Well-developed,well-nourished,in no acute distress; alert,appropriate and cooperative throughout examination Head:  normocephalic, atraumatic, and no abnormalities observed.    Eyes:  vision grossly intact, pupils equal, pupils round, and pupils reactive to light.  no conjunctival pallor, injection or icterus  Nose:  no nasal discharge.   Mouth:  pharynx pink and moist.   Neck:  supple with full rom and no masses or thyromegally, no JVD or carotid bruit  Chest Wall:  No deformities, masses, or tenderness noted. Lungs:  Normal respiratory effort, chest expands symmetrically. Lungs are clear to auscultation, no crackles or wheezes. Heart:  Regular rate and rhythm; no murmurs, rubs,  or bruits. Abdomen:  Bowel sounds positive,abdomen soft and non-tender without masses, organomegaly or hernias noted. Msk:  no LS tenderness or cva tenderness full rom LS  some bilat perilumbar tenderness  nl rom hips  no acute joint changes Pulses:  R and L carotid,radial,femoral,dorsalis pedis and posterior tibial pulses are full and equal bilaterally Extremities:  No clubbing, cyanosis, edema, or deformity noted with normal full range of motion of all joints.   Neurologic:  sensation intact to light touch, gait normal, and DTRs symmetrical and normal.  no tremor Skin:  Intact without suspicious lesions or rashes Cervical Nodes:  No lymphadenopathy noted Inguinal Nodes:  No significant adenopathy Psych:  normal affect, talkative and pleasant    Impression & Recommendations:  Problem # 1:  WEIGHT LOSS, ABNORMAL (ICD-783.21) Assessment Unchanged  ongoing wt loss with neg workup so far--rev this in detail with pt today no signs of malabsorbtion and neg colonosc nl cxr and labs ref for yearly gyn eval ref to endo for further eval   Orders: Gynecologic Referral (Gyn) Endocrinology Referral (Endocrine)  Problem # 2:  CORONARY ARTERY DISEASE, FAMILY HX (ICD-V17.3) Assessment: Unchanged pt states her mother died of heart dz and this worries her  disc risk factors  when wt loss w/u is done would like to see cardiologist for general eval visit  will arrange that when above is  done  Complete Medication List: 1)  Albuterol Sulfate 4 Mg Tabs (Albuterol sulfate) .Marland Kitchen.. 1 by mouth q 4 hours as needed 2)  Synthroid 88 Mcg Tabs (Levothyroxine sodium) .... One by mouth once daily 3)  Diazepam 5 Mg Tabs (Diazepam) .... One by mouth three times a day as needed 4)  Hydrocodone-acetaminophen 10-650 Mg Tabs (Hydrocodone-acetaminophen) .Marland Kitchen.. 1 by mouth two times a day as needed pain 5)  Ventolin Hfa 108 (90 Base) Mcg/act Aers (Albuterol sulfate) .... 2 puffs q 4 hours as needed 6)  Fish Oil Oil (Fish oil) .... Take 1-2 caps by mouth once daily 7)  Calcium Carbonate 1500 Mg Tabs (Calcium carbonate) .... Take 1 tablet by mouth once a day 8)  Lasix 40 Mg Tabs (Furosemide) .Marland Kitchen.. 1 by mouth once daily as needed 9)  Vitamin B-1 250 Mg Tabs (Thiamine hcl) .... Otc as directed. 10)  Multivitamins Tabs (Multiple vitamin) .... Otc as directed. 11)  Cyanocobalamin 1000 Mcg/ml Soln (Cyanocobalamin) .... Inject 1 ml once monthly 12)  Vitamin B-12 500 Mcg Tabs (Cyanocobalamin) .... Take 1 tablet by mouth once a day  Patient Instructions: 1)  we will do gyn referral and endocrine  referral at check out  2)  no change in medicine  3)  continue your regular meals and meal supplements   Current Allergies (reviewed today): ! DETROL (TOLTERODINE TARTRATE) ! VITAMIN D (ERGOCALCIFEROL) (ERGOCALCIFEROL) PAMELOR MORPHINE * ALCOHOL

## 2010-12-28 NOTE — Letter (Signed)
Summary: [facility]   Imported By: Lanelle Bal 01/26/2010 12:07:46  _____________________________________________________________________  External Attachment:    Type:   Image     Comment:   External Document

## 2010-12-28 NOTE — Progress Notes (Signed)
Summary: refill request for vicodin  Phone Note Refill Request Message from:  Fax from Pharmacy  Refills Requested: Medication #1:  HYDROCODONE-ACETAMINOPHEN 10-650 MG  TABS 1 by mouth two times a day as needed pain   Last Refilled: 03/13/2010 [facility] [address], [phone].  Initial call taken by: Lowella Petties CMA,  May 11, 2010 11:30 AM  Follow-up for Phone Call        px written on EMR for call in  Follow-up by: Judith Part MD,  May 11, 2010 12:57 PM  Additional Follow-up for Phone Call Additional follow up Details #1::        [facility]. Lewanda Rife LPN  May 11, 2010 1:08 PM     New/Updated Medications: HYDROCODONE-ACETAMINOPHEN 10-650 MG  TABS (HYDROCODONE-ACETAMINOPHEN) 1 by mouth two times a day as needed pain Prescriptions: HYDROCODONE-ACETAMINOPHEN 10-650 MG  TABS (HYDROCODONE-ACETAMINOPHEN) 1 by mouth two times a day as needed pain  #60 x 1   Entered and Authorized by:   Judith Part MD   Signed by:   Lewanda Rife LPN on 45/40/9811   Method used:   Telephoned to ...       [facility]  [place] (retail)       [address]       [place], [place]  [address]       Ph: [phone] or [phone]       Fax: [phone]   RxID:   [id number]

## 2010-12-28 NOTE — Assessment & Plan Note (Signed)
Summary: B-12 shot  Nurse Visit   Allergies: 1)  ! * Vitamin D 50,000 Iu 2)  * Detrol 3)  Pamelor 4)  Morphine 5)  * Alcohol  Medication Administration  Injection # 1:    Medication: Vit B12 1000 mcg    Diagnosis: B12 DEFICIENCY (ICD-266.2)    Route: IM    Site: L deltoid    Exp Date: 08/27/2011    Lot #: [id number]    Mfr: American Regent    Patient tolerated injection without complications    Given by: Lewanda Rife LPN (January 24, 4097 12:22 PM)  Orders Added: 1)  Vit B12 1000 mcg [J3420] 2)  Admin of Therapeutic Inj  intramuscular or subcutaneous [11914]

## 2010-12-28 NOTE — Progress Notes (Signed)
Summary: diazepam   Phone Note Refill Request Message from:  Fax from Pharmacy on October 24, 2010 8:53 AM  Refills Requested: Medication #1:  DIAZEPAM 5 MG  TABS one by mouth three times a day as needed   Last Refilled: 08/10/2010 [facility] [phone].   Initial call taken by: Melody Comas,  October 24, 2010 8:53 AM  Follow-up for Phone Call        px written on EMR for call in  Follow-up by: Judith Part MD,  October 24, 2010 10:00 AM  Additional Follow-up for Phone Call Additional follow up Details #1::        Medication phoned to [facility]  pharmacy as instructed. Lewanda Rife LPN  October 24, 2010 10:13 AM     New/Updated Medications: DIAZEPAM 5 MG  TABS (DIAZEPAM) one by mouth three times a day as needed Prescriptions: DIAZEPAM 5 MG  TABS (DIAZEPAM) one by mouth three times a day as needed  #90 x 3   Entered and Authorized by:   Judith Part MD   Signed by:   Lewanda Rife LPN on 45/40/9811   Method used:   Telephoned to ...       [facility] [facility]ail)       [address]       [place], [place]  [address]       Ph: [phone] or [phone]       Fax: [phone]   RxID:   [id number]

## 2010-12-28 NOTE — Assessment & Plan Note (Signed)
Summary: b12 shot/dlo  Nurse Visit   Allergies: 1)  ! * Vitamin D 50,000 Iu 2)  * Detrol 3)  Pamelor 4)  Morphine 5)  * Alcohol  Medication Administration  Injection # 1:    Medication: Vit B12 1000 mcg    Diagnosis: B12 DEFICIENCY (ICD-266.2)    Route: IM    Site: R deltoid    Exp Date: 08/2011    Lot #: [id number]    Mfr: American Regent    Patient tolerated injection without complications    Given by: Lowella Petties CMA (December 23, 2009 11:31 AM)  Orders Added: 1)  Vit B12 1000 mcg [J3420] 2)  Admin of Therapeutic Inj  intramuscular or subcutaneous [[id number]]   Medication Administration  Injection # 1:    Medication: Vit B12 1000 mcg    Diagnosis: B12 DEFICIENCY (ICD-266.2)    Route: IM    Site: R deltoid    Exp Date: 08/2011    Lot #: [id number]    Mfr: American Regent    Patient tolerated injection without complications    Given by: Lowella Petties CMA (December 23, 2009 11:31 AM)  Orders Added: 1)  Vit B12 1000 mcg [J3420] 2)  Admin of Therapeutic Inj  intramuscular or subcutaneous [[id number]]

## 2010-12-28 NOTE — Assessment & Plan Note (Signed)
Summary: weight loss & hx of colon polyps/yf   History of Present Illness Visit Type: consult Primary GI MD: Elie Goody MD [facility] Primary Provider: Roxy Manns, MD Requesting Provider: Roxy Manns, MD Chief Complaint: pt has lost 23 pounds since last September.  She has had 3 episodes of blood in her stool in last 10 months, 2 episodes occured with diarrhea History of Present Illness:   This is a 77 year old female, who relates a steady weight loss since September 2010. She notes a 23 pound weight loss, which has been gradual and without a change in appetite or diet.  She has had 3 episodes of small amounts of bright red blood with bowel movements. Over the past 10 months 2 episodes occurred with the transient episodes of diarrhea. She has history of adenomatous colon polyps diagnosed in February 2002, and did not return for a recommended surveillance colonoscopy in 2007. She was also apparently seen by Dr. Matthias Hughs who recommended a colonoscopy in 01/2008, but she did not schedule. She underwent a CT scan in December 2009 for right upper quadrant pain that showed mild chronic intra-and extrahepatic biliary dilatation post cholecystectomy.  A recent chemistry panel, CBC, TSH, iFOB and chest x-ray were all unremarkable.   GI Review of Systems    Reports weight loss.      Denies abdominal pain, acid reflux, belching, bloating, chest pain, dysphagia with liquids, dysphagia with solids, heartburn, loss of appetite, nausea, vomiting, vomiting blood, and  weight gain.      Reports rectal bleeding.     Denies anal fissure, black tarry stools, change in bowel habit, constipation, diarrhea, diverticulosis, fecal incontinence, heme positive stool, hemorrhoids, irritable bowel syndrome, jaundice, light color stool, liver problems, and  rectal pain.   Current Medications (verified): 1)  Albuterol Sulfate 4 Mg Tabs (Albuterol Sulfate) .Marland Kitchen.. 1 By Mouth Q 4 Hours As Needed 2)  Synthroid 88 Mcg  Tabs  (Levothyroxine Sodium) .... One By Mouth Once Daily 3)  Diazepam 5 Mg  Tabs (Diazepam) .... One By Mouth Three Times A Day As Needed 4)  Hydrocodone-Acetaminophen 10-650 Mg  Tabs (Hydrocodone-Acetaminophen) .Marland Kitchen.. 1 By Mouth Two Times A Day As Needed Pain 5)  Ventolin Hfa 108 (90 Base) Mcg/act  Aers (Albuterol Sulfate) .... 2 Puffs Q 4 Hours As Needed 6)  Fish Oil  Oil (Fish Oil) .... Take 1-2 Caps By Mouth Once Daily 7)  Calcium Carbonate 1500 Mg Tabs (Calcium Carbonate) .... Take 1 Tablet By Mouth Once A Day 8)  Lasix 40 Mg Tabs (Furosemide) .Marland Kitchen.. 1 By Mouth Once Daily As Needed 9)  Vitamin B-1 250 Mg  Tabs (Thiamine Hcl) .... Otc As Directed. 10)  Multivitamins   Tabs (Multiple Vitamin) .... Otc As Directed. 11)  Cyanocobalamin 1000 Mcg/ml Soln (Cyanocobalamin) .... Inject 1 Ml Once Monthly 12)  Vitamin B-12 500 Mcg Tabs (Cyanocobalamin) .... Take 1 Tablet By Mouth Once A Day  Allergies: 1)  ! Detrol (Tolterodine Tartrate) 2)  ! Vitamin D (Ergocalciferol) (Ergocalciferol) 3)  Pamelor 4)  Morphine 5)  * Alcohol  Past History:  Past Medical History: Asthma Hypothyroidism Osteoarthritis IBS fibromyalgia hx of herpes zoster  hyperlipidemia  Adenomatous Colon Polyps 12/2000  GYN -- Grewal Urol-- Patsi Sears  ortho- Magnus Ivan  gastro--[facility]  Past Surgical History: Reviewed history from 12/09/2008 and no changes required. Hysterectomy- fibroid, endometriosis (1970) 2 cervical disk repairs Colonoscopy- polyps, BE- polyps (12/2000) Recurrent shingles Pelvic US- small left ovarian cysts (08/2001) Dexa- normal (1999),  slight hip  osteopenia (12/2001) Dexa- no significant change (05/2003),  osteopenia (06/2005) Gallstones (06/2003),  Abd [place]- neg (06/2003) Cholecystectomy (07/2003) Bladder tack up (03/2004) Dexa- osteopenia- stable (06/2007) CT abd/pelvis 12/09-  mild biliary dilatation  Family History: Reviewed history from 11/09/2008 and no changes required. Father: deceased  age 31- gunshot wound Mother: deceased- MI, several strokes Siblings: several- 1 brother with lupus, sister with benign brain tumor daughter asthma  brother with heart problems  Family History of Colon Cancer:1st cousin  Social History: Reviewed history from 08/29/2009 and no changes required. Marital Status: widowed Children: 4 Occupation: retired non smoker avoids alcohol- is allergic to it  Patient gets regular exercise. granson lives with her  Review of Systems       The pertinent positives and negatives are noted as above and in the HPI. All other ROS were reviewed and were negative.   Vital Signs:  Patient profile:   77 year old female Height:      66 inches Weight:      135 pounds BMI:     21.87 Pulse rate:   88 / minute Pulse rhythm:   regular BP sitting:   110 / 66  (left arm) Cuff size:   regular  Vitals Entered By: Francee Piccolo CMA Duncan Dull) (May 18, 2010 3:07 PM)  Physical Exam  General:  Well developed, well nourished, no acute distress. Head:  Normocephalic and atraumatic. Eyes:  PERRLA, no icterus. Ears:  Normal auditory acuity. Mouth:  No deformity or lesions, dentition normal. Neck:  Supple; no masses or thyromegaly. Lungs:  Clear throughout to auscultation. Heart:  Regular rate and rhythm; no murmurs, rubs,  or bruits. Abdomen:  Soft, nontender and nondistended. No masses, hepatosplenomegaly or hernias noted. Normal bowel sounds. Rectal:  deferred until time of colonoscopy.   Msk:  Symmetrical with no gross deformities. Normal posture. Pulses:  Normal pulses noted. Extremities:  No clubbing, cyanosis, edema or deformities noted. Neurologic:  Alert and  oriented x4;  grossly normal neurologically. Cervical Nodes:  No significant cervical adenopathy. Inguinal Nodes:  No significant inguinal adenopathy. Psych:  Alert and cooperative. Normal mood and affect.  Impression & Recommendations:  Problem # 1:  WEIGHT LOSS, ABNORMAL  (ICD-783.21) Steady weight loss without a clear etiology. Rule out occult neoplasm, malabsorption. Prior history of adenomatous colon polyps and intermittent small-volume hematochezia. Will proceed with colonoscopy for further evaluation. The risks, benefits and alternatives to colonoscopy with possible biopsy and possible polypectomy were discussed with the patient and they consent to proceed. The procedure will be scheduled electively.  Orders: Colonoscopy (Colon)  Problem # 2:  BLOOD IN STOOL (ICD-578.1) See problem #1. Orders: Colonoscopy (Colon)  Problem # 3:  COLONIC POLYPS, HX OF (ICD-V12.72) History of adenomatous colon polyps. Prior colonoscopy completed to the transverse colon, and barium enema required.  Patient Instructions: 1)  Pick up your prep from your pharmacy.  2)  Colonoscopy brochure given.  3)  Copy sent to : Roxy Manns, MD 4)  The medication list was reviewed and reconciled.  All changed / newly prescribed medications were explained.  A complete medication list was provided to the patient / caregiver.  Prescriptions: MOVIPREP 100 GM  SOLR (PEG-KCL-NACL-NASULF-NA ASC-C) As per prep instructions.  #1 x 0   Entered by:   Christie Nottingham CMA (AAMA)   Authorized by:   Meryl Dare MD [facility]   Signed by:   Meryl Dare MD [facility] on 05/18/2010   Method used:   [facility] ([facility]ail)       [address]       [place], [place]  [address]       Ph: [phone] or [phone]       Fax: [phone]   RxID:   [id number]

## 2010-12-28 NOTE — Assessment & Plan Note (Signed)
Summary: B-12 INJ/TOWER/CLE  Nurse Visit   Allergies: 1)  ! * Vitamin D 50,000 Iu 2)  * Detrol 3)  Pamelor 4)  Morphine 5)  * Alcohol  Medication Administration  Injection # 1:    Medication: Vit B12 1000 mcg    Diagnosis: B12 DEFICIENCY (ICD-266.2)    Route: IM    Site: R deltoid    Exp Date: 10/2011    Lot #: [id number]    Mfr: American Regent    Patient tolerated injection without complications    Given by: Lowella Petties CMA (May 09, 2010 9:51 AM)  Orders Added: 1)  Vit B12 1000 mcg [J3420] 2)  Admin of Therapeutic Inj  intramuscular or subcutaneous [[id number]]   Medication Administration  Injection # 1:    Medication: Vit B12 1000 mcg    Diagnosis: B12 DEFICIENCY (ICD-266.2)    Route: IM    Site: R deltoid    Exp Date: 10/2011    Lot #: [id number]    Mfr: American Regent    Patient tolerated injection without complications    Given by: Lowella Petties CMA (May 09, 2010 9:51 AM)  Orders Added: 1)  Vit B12 1000 mcg [J3420] 2)  Admin of Therapeutic Inj  intramuscular or subcutaneous [[id number]]

## 2010-12-28 NOTE — Assessment & Plan Note (Signed)
Summary: B-12  Nurse Visit   Allergies: 1)  ! Detrol (Tolterodine Tartrate) 2)  ! Vitamin D (Ergocalciferol) (Ergocalciferol) 3)  Pamelor 4)  Morphine 5)  * Alcohol  Medication Administration  Injection # 1:    Medication: Vit B12 1000 mcg    Diagnosis: B12 DEFICIENCY (ICD-266.2)    Route: IM    Site: L deltoid    Exp Date: 10/2011    Lot #: [id number]    Mfr: American Regent    Patient tolerated injection without complications    Given by: Lowella Petties CMA (June 13, 2010 1:08 PM)  Orders Added: 1)  Vit B12 1000 mcg [J3420] 2)  Admin of Therapeutic Inj  intramuscular or subcutaneous [96372]   Medication Administration  Injection # 1:    Medication: Vit B12 1000 mcg    Diagnosis: B12 DEFICIENCY (ICD-266.2)    Route: IM    Site: L deltoid    Exp Date: 10/2011    Lot #: [id number]    Mfr: American Regent    Patient tolerated injection without complications    Given by: Lowella Petties CMA (June 13, 2010 1:08 PM)  Orders Added: 1)  Vit B12 1000 mcg [J3420] 2)  Admin of Therapeutic Inj  intramuscular or subcutaneous [[id number]]

## 2010-12-28 NOTE — Assessment & Plan Note (Signed)
Summary: WEIGHT LOSS/DLO   Vital Signs:  Patient profile:   77 year old female Height:      66 inches Weight:      139.25 pounds BMI:     22.56 Temp:     98.1 degrees F oral Pulse rate:   80 / minute Pulse rhythm:   regular BP sitting:   120 / 70  (left arm) Cuff size:   regular  Vitals Entered By: Lewanda Rife LPN (Apr 12, 2010 3:29 PM) CC: weight loss   History of Present Illness: here for wt loss wt is down 19 lb from last visit today top wt was 160 in 09- now is 139  has not been trying to loose wt at all - in fact eats very well   appetite- is fine  in 1971-- lost a lot of wt with nl eating and never new why  had low sugar at that time  is worried about DM now   is feeling a little dizzy at times  this is how she felt back in 1970s  is more thirsty than usual  drinks constantly  so also urinates a lot -- also incontinence issues    no inc in exercise   thyroid stable in fall - 10/10  B12 level nl in jan   colonosc 02 - did not want to do another-- wants to wait on that   grandaughter has DM1 daughter has gestational DM   is having back/ disc problems causing hip pain  did spinal epidural in march- much better now    Allergies: 1)  ! * Vitamin D 50,000 Iu 2)  * Detrol 3)  Pamelor 4)  Morphine 5)  * Alcohol  Past History:  Past Medical History: Last updated: 08/29/2009 Asthma Hypothyroidism Osteoarthritis IBS fibromyalgia hx of herpes zoster  hyperlipidemia   GYN -- Grewal Urol-- Patsi Sears  orthoMagnus Ivan   Past Surgical History: Last updated: 12/09/2008 Hysterectomy- fibroid, endometriosis (1970) 2 cervical disk repairs Colonoscopy- polyps, BE- polyps (12/2000) Recurrent shingles Pelvic US- small left ovarian cysts (08/2001) Dexa- normal (1999),  slight hip osteopenia (12/2001) Dexa- no significant change (05/2003),  osteopenia (06/2005) Gallstones (06/2003),  Abd [place]- neg (06/2003) Cholecystectomy (07/2003) Bladder tack up  (03/2004) Dexa- osteopenia- stable (06/2007) CT abd/pelvis 12/09-  mild biliary dilatation  Family History: Last updated: Nov 14, 2008 Father: deceased age 21- gunshot wound Mother: deceased- MI, several strokes Siblings: several- 1 brother with lupus, sister with benign brain tumor daughter asthma  brother with heart problems  Family History of Colon Cancer:1st cousin  Social History: Last updated: 08/29/2009 Marital Status: widowed Children: 4 Occupation: retired non smoker avoids alcohol- is allergic to it  Patient gets regular exercise. granson lives with her  Risk Factors: Exercise: yes (11-14-2008)  Risk Factors: Smoking Status: never (08/04/2007)  Review of Systems General:  Complains of fatigue; denies fever, loss of appetite, and malaise. Eyes:  Denies blurring and eye irritation. CV:  Denies chest pain or discomfort, lightheadness, palpitations, and shortness of breath with exertion. Resp:  Denies cough, shortness of breath, and wheezing. GI:  Denies abdominal pain, diarrhea, and indigestion. GU:  Denies abnormal vaginal bleeding, hematuria, and urinary frequency. MS:  Complains of low back pain; denies joint redness, joint swelling, muscle aches, and cramps. Derm:  Denies itching, lesion(s), poor wound healing, and rash. Neuro:  Denies numbness and tingling. Psych:  Denies anxiety and depression. Endo:  Denies excessive thirst and excessive urination. Heme:  Denies abnormal bruising and bleeding.  Physical Exam  General:  Well-developed,well-nourished,in no acute distress; alert,appropriate and cooperative throughout examination Head:  normocephalic, atraumatic, and no abnormalities observed.   Eyes:  vision grossly intact, pupils equal, pupils round, and pupils reactive to light.  no conjunctival pallor, injection or icterus  Mouth:  pharynx pink and moist.   Neck:  supple with full rom and no masses or thyromegally, no JVD or carotid bruit  Chest Wall:  No  deformities, masses, or tenderness noted. Lungs:  Normal respiratory effort, chest expands symmetrically. Lungs are clear to auscultation, no crackles or wheezes. Heart:  Regular rate and rhythm; no murmurs, rubs,  or bruits. Abdomen:  Bowel sounds positive,abdomen soft and non-tender without masses, organomegaly or hernias noted. Msk:  No deformity or scoliosis noted of thoracic or lumbar spine.  no acute joint changes good rom spine  Pulses:  R and L carotid,radial,femoral,dorsalis pedis and posterior tibial pulses are full and equal bilaterally Extremities:  No clubbing, cyanosis, edema, or deformity noted with normal full range of motion of all joints.   Neurologic:  sensation intact to light touch, gait normal, and DTRs symmetrical and normal.   Skin:  Intact without suspicious lesions or rashes Cervical Nodes:  No lymphadenopathy noted Axillary Nodes:  No palpable lymphadenopathy Inguinal Nodes:  No significant adenopathy Psych:  normal affect, talkative and pleasant    Impression & Recommendations:  Problem # 1:  WEIGHT LOSS, ABNORMAL (ICD-783.21) Assessment New  without particular reason or change in appetite or food intake or exercise  19 lb since fall  pt is resistant to colonosc- given stool card  labs today - some thirst and urination- disc r/o DM  disc imp of regular meals with protien  Orders: Venipuncture (16109) TLB-Renal Function Panel (80069-RENAL) TLB-CBC Platelet - w/Differential (85025-CBCD) TLB-Hepatic/Liver Function Pnl (80076-HEPATIC) TLB-TSH (Thyroid Stimulating Hormone) (84443-TSH) TLB-A1C / Hgb A1C (Glycohemoglobin) (83036-A1C)  Problem # 2:  FATIGUE (ICD-780.79) Assessment: Unchanged see above - mild  ? if related  lab today Orders: Venipuncture (60454) TLB-Renal Function Panel (80069-RENAL) TLB-CBC Platelet - w/Differential (85025-CBCD) TLB-Hepatic/Liver Function Pnl (80076-HEPATIC) TLB-TSH (Thyroid Stimulating Hormone) (84443-TSH)  Problem  # 3:  PERSONAL HX COLONIC POLYPS (ICD-V12.72) Assessment: Unchanged stool card given if no cause given for wt loss -may need to consider colonosc   Problem # 4:  Hx of HYPOGLYCEMIA (ICD-251.2) Assessment: Improved in past - not lately  wants to check for DM check sugar and AIC  Orders: Venipuncture (09811) TLB-Renal Function Panel (80069-RENAL) TLB-CBC Platelet - w/Differential (85025-CBCD) TLB-Hepatic/Liver Function Pnl (80076-HEPATIC) TLB-TSH (Thyroid Stimulating Hormone) (84443-TSH) TLB-A1C / Hgb A1C (Glycohemoglobin) (83036-A1C)  Problem # 5:  HYPOTHYROIDISM (ICD-244.9) Assessment: Unchanged  stable tsh in fall re check in light of wt loss no changes in dose lately Her updated medication list for this problem includes:    Synthroid 88 Mcg Tabs (Levothyroxine sodium) ..... One by mouth once daily  Orders: Venipuncture (91478) TLB-Renal Function Panel (80069-RENAL) TLB-CBC Platelet - w/Differential (85025-CBCD) TLB-Hepatic/Liver Function Pnl (80076-HEPATIC) TLB-TSH (Thyroid Stimulating Hormone) (84443-TSH)  Labs Reviewed: TSH: 1.55 (08/29/2009)    Chol: 173 (08/29/2009)   HDL: 52.80 (08/29/2009)   LDL: 106 (08/29/2009)   TG: 73.0 (08/29/2009)  Complete Medication List: 1)  Albuterol Sulfate 4 Mg Tabs (Albuterol sulfate) .Marland Kitchen.. 1 by mouth q 4 hours as needed 2)  Synthroid 88 Mcg Tabs (Levothyroxine sodium) .... One by mouth once daily 3)  Diazepam 5 Mg Tabs (Diazepam) .... One by mouth three times a day as needed 4)  Hydrocodone-acetaminophen 10-650  Mg Tabs (Hydrocodone-acetaminophen) .Marland Kitchen.. 1 by mouth two times a day as needed pain 5)  Ventolin Hfa 108 (90 Base) Mcg/act Aers (Albuterol sulfate) .... 2 puffs q 4 hours as needed 6)  Fish Oil Caps  .... Daily 7)  Calcium  .... Daily 8)  Lasix 40 Mg Tabs (Furosemide) .Marland Kitchen.. 1 by mouth once daily as needed 9)  Vitamin B-1 250 Mg Tabs (Thiamine hcl) .... Otc as directed. 10)  Multivitamins Tabs (Multiple vitamin) .... Otc as  directed.  Patient Instructions: 1)  we are doing labs today for weight loss 2)  try to eat regular meals with protien  3)  also do stool card please  4)  will make further plan when labsreturn   Current Allergies (reviewed today): ! * VITAMIN D 50,000 IU * DETROL PAMELOR MORPHINE * ALCOHOL

## 2010-12-28 NOTE — Progress Notes (Signed)
Summary: Rx Hydrocodone  Phone Note Refill Request Call back at [phone] Message [facility]:  [facility] on January 13, 2010 10:45 AM  Refills Requested: Medication #1:  HYDROCODONE-ACETAMINOPHEN 10-650 MG  TABS 1 by mouth two times a day as needed pain   Last Refilled: 12/12/2009 Received faxed refill request, please advise   Method Requested: Telephone to Pharmacy Initial call taken by: Linde Gillis CMA Duncan Dull),  January 13, 2010 10:45 AM  Follow-up for Phone Call        px written on EMR for call in  Follow-up by: Judith Part MD,  January 13, 2010 11:23 AM  Additional Follow-up for Phone Call Additional follow up Details #1::        Medication phoned to pharmacy.  Additional Follow-up by: Delilah Shan CMA Duncan Dull),  January 13, 2010 12:41 PM    New/Updated Medications: HYDROCODONE-ACETAMINOPHEN 10-650 MG  TABS (HYDROCODONE-ACETAMINOPHEN) 1 by mouth two times a day as needed pain Prescriptions: HYDROCODONE-ACETAMINOPHEN 10-650 MG  TABS (HYDROCODONE-ACETAMINOPHEN) 1 by mouth two times a day as needed pain  #60 x 1   Entered and Authorized by:   Judith Part MD   Signed by:   Delilah Shan CMA (AAMA) on 01/13/2010   Method used:   Telephoned to ...       [facility] [facility]ail)       [address]       [place], [place]  [address]       Ph: [phone] or [phone]       Fax: [phone]   RxID:   [id number]

## 2010-12-28 NOTE — Assessment & Plan Note (Signed)
Summary: B-12  Nurse Visit   Allergies: 1)  ! Detrol (Tolterodine Tartrate) 2)  ! Vitamin D (Ergocalciferol) (Ergocalciferol) 3)  Pamelor 4)  Morphine 5)  * Alcohol  Medication Administration  Injection # 1:    Medication: Vit B12 1000 mcg    Diagnosis: B12 DEFICIENCY (ICD-266.2)    Route: IM    Site: R deltoid    Exp Date: 10/27/2011    Lot #: [id number]    Mfr: American Regent    Patient tolerated injection without complications    Given by: Sydell Axon LPN (July 14, 2010 10:57 AM)  Orders Added: 1)  Vit B12 1000 mcg [J3420] 2)  Admin of Therapeutic Inj  intramuscular or subcutaneous [96372]   Medication Administration  Injection # 1:    Medication: Vit B12 1000 mcg    Diagnosis: B12 DEFICIENCY (ICD-266.2)    Route: IM    Site: R deltoid    Exp Date: 10/27/2011    Lot #: [id number]    Mfr: American Regent    Patient tolerated injection without complications    Given by: Sydell Axon LPN (July 14, 2010 10:57 AM)  Orders Added: 1)  Vit B12 1000 mcg [J3420] 2)  Admin of Therapeutic Inj  intramuscular or subcutaneous [08657]

## 2010-12-28 NOTE — Progress Notes (Signed)
Summary: wants brand only synthroid  Phone Note Call from Patient   Caller: Patient Call For: Carla Kelly Action Taken: Patient advised to go to ER Summary of Call: Pt states she was switched to generic synthroid about 2 months ago and has noticed that her hair is falling out and she has been having hot flashes.  She wants [place] to change her script at [facility] [address] [place] [facility] for brand name only. Initial call taken by: Lowella Petties CMA, AAMA,  October 09, 2010 3:49 PM  Follow-up for Phone Call        that is fine with me px written on EMR for call in  Follow-up by: Carla Kelly,  October 09, 2010 4:09 PM   New Allergies: * GENERIC SYNTHROID New/Updated Medications: SYNTHROID 88 MCG TABS (LEVOTHYROXINE SODIUM) BRAND NAME ONLY Take 1 tablet by mouth once a day New Allergies: * GENERIC SYNTHROIDPrescriptions: SYNTHROID 88 MCG TABS (LEVOTHYROXINE SODIUM) BRAND NAME ONLY Take 1 tablet by mouth once a day  #30 x 12   Entered by:   Delilah Shan CMA (AAMA)   Authorized by:   Carla Kelly   Signed by:   Delilah Shan CMA (AAMA) on 10/09/2010   Method used:   Electronically [facility]        [facility] (retail)       [address]       [place], [place]  [address]       Ph: [phone] or [phone]       Fax: [phone]   RxID:   [id number]

## 2011-01-22 ENCOUNTER — Telehealth: Payer: Self-pay | Admitting: Family Medicine

## 2011-01-24 ENCOUNTER — Telehealth: Payer: Self-pay | Admitting: Family Medicine

## 2011-02-01 NOTE — Progress Notes (Signed)
Summary: Albuterol Sulfate 4mg   Phone Note Refill Request Call back at [phone] Message from:  [facility] on January 24, 2011 4:48 PM  Refills Requested: Medication #1:  ALBUTEROL SULFATE 4 MG TABS 1 by mouth q 4 hours as needed   Last Refilled: 09/19/2010 [facility] request refill on Albuteral sulfate 4mg  tabs Last refill 09/19/10.Please advise.    Method Requested: Electronic Initial call taken by: Lewanda Rife LPN,  January 24, 2011 4:48 PM  Follow-up for Phone Call        px written on EMR for call in  Follow-up by: Judith Part MD,  January 24, 2011 5:16 PM  Additional Follow-up for Phone Call Additional follow up Details #1::        med sent electronically to [facility] as instructed.Lewanda Rife LPN  January 24, 2011 5:21 PM     New/Updated Medications: ALBUTEROL SULFATE 4 MG TABS (ALBUTEROL SULFATE) 1 by mouth q 4 hours as needed Prescriptions: ALBUTEROL SULFATE 4 MG TABS (ALBUTEROL SULFATE) 1 by mouth q 4 hours as needed  #120 x 5   Entered by:   Lewanda Rife LPN   Authorized by:   Judith Part MD   Signed by:   Lewanda Rife LPN on 11/91/4782   Method used:   Electronically to        [facility] (retail)       [address]       [place], [place]  [address]       Ph: [phone] or [phone]       Fax: [phone]   RxID:   [id number] ALBUTEROL SULFATE 4 MG TABS (ALBUTEROL SULFATE) 1 by mouth q 4 hours as needed  #120 x 5   Entered and Authorized by:   Judith Part MD   Signed by:   Judith Part MD on 01/24/2011   Method used:   Telephoned to ...       [facility] (retail)       [address]       [place], [place]  [address]       Ph: [phone] or [phone]       Fax: [phone]   RxID:   [id number]

## 2011-02-01 NOTE — Progress Notes (Signed)
Summary: hydrocodone  Phone Note Refill Request Message from:  Fax from Pharmacy on January 22, 2011 10:29 AM  Refills Requested: Medication #1:  HYDROCODONE-ACETAMINOPHEN 10-650 MG  TABS 1 by mouth two times a day as needed pain   Last Refilled: 12/18/2010 [facility]fill request [facility] [phone]  Initial call taken by: Melody Comas,  January 22, 2011 10:30 AM  Follow-up for Phone Call        px written on EMR for call in  Follow-up by: Judith Part MD,  January 22, 2011 10:34 AM  Additional Follow-up for Phone Call Additional follow up Details #1::        [facility], advised pt. Additional Follow-up by: Lowella Petties CMA, AAMA,  January 22, 2011 11:42 AM    Prescriptions: HYDROCODONE-ACETAMINOPHEN 10-650 MG  TABS (HYDROCODONE-ACETAMINOPHEN) 1 by mouth two times a day as needed pain  #60 x 3   Entered and Authorized by:   Judith Part MD   Signed by:   Judith Part MD on 01/22/2011   Method used:   Telephoned to ...       [facility] (retail)       [address]       [place], [place]  [address]       Ph: [phone] or [phone]       Fax: [phone]   RxID:   [id number]

## 2011-03-07 LAB — POCT I-STAT 4, (NA,K, GLUC, HGB,HCT)
Glucose, Bld: 85 mg/dL (ref 70–99)
HCT: 41 % (ref 36.0–46.0)
Hemoglobin: 13.9 g/dL (ref 12.0–15.0)

## 2011-04-10 NOTE — Assessment & Plan Note (Signed)
[facility]                                 ON-CALL NOTE   NAME:Carla Kelly, Carla Kelly                           MRN:          [id number]  DATE:01/20/2008                            DOB:          1933/07/13    Phone number [phone].   PRIMARY:  Dr. Milinda Antis.   SUBJECTIVE:  Carla Kelly states that she swallowed a small piece of a  plastic container for some type of vegetables.  She states that she did  not have any pain with it going down, but does have an upset stomach  now.  She is unsure what to do   ASSESSMENT/PLAN:  Suggested watchful waiting.  If she has severe  abdominal pain go to the emergency room.  I instructed her to drink lots  of fluids but not to use any laxatives or anything to try to make move  through her faster.  Per her description, item is fairly small and will  likely pass without any difficulties.     Kerby Nora, MD  Electronically Signed    AB/MedQ  DD: 01/20/2008  DT: 01/21/2008  Job #: [id number]

## 2011-04-10 NOTE — Op Note (Signed)
Carla Kelly, Carla Kelly                  ACCOUNT NO.:  [id number]   MEDICAL RECORD NO.:  [id number]          PATIENT TYPE:  AMB   LOCATION:  [facility]                         FACILITY:  [facility]   PHYSICIAN:  Sigmund I. Patsi Sears, M.D.DATE OF BIRTH:  11-12-1933   DATE OF PROCEDURE:  03/24/2009  DATE OF DISCHARGE:                               OPERATIVE REPORT   PREOPERATIVE DIAGNOSIS:  Intrinsic sphincter deficiency with urinary  incontinence.   POSTOPERATIVE DIAGNOSIS:  Intrinsic sphincter deficiency with urinary  incontinence plus vaginal epithelial tag.   OPERATIONS:  Vaginal inspection, exam under anesthesia, excision of  vaginal tag, injection of urethra with Macroplastique.   SURGEON:  Sigmund I. Patsi Sears, M.D.   ANESTHESIA:  General LMA.   PREPARATION:  After appropriate preanesthesia, the patient was brought  to the operating room and placed on the operating room table in dorsal  supine position where general LMA anesthesia was introduced.  She was  then replaced in dorsal lithotomy position where the pubis was prepped  with Betadine solution and draped in the usual fashion.   REVIEW OF HISTORY:  [name] is a 77 year old female, with 2-3 pad  incontinence per day.  She is status post Mentor transobturator sling in  2005, and noticed gradual restart of incontinence in 2006.  She believes  she has a vaginal bulge.  The patient has urodynamics showing a leak  point pressure of 56 cm of water.  She is now for exam under anesthesia,  possible cystocele repair and Macroplastique injection.   PROCEDURE IN DETAIL:  Vaginal inspection was accomplished and it shows  hypertrophic vascularized tissue in the left vaginal anterior vaginal  floor.  This is excised, and the base is cauterized.  Remaining  inspection reveals that there is no evidence of extrusion of the Mentor  transobturator tape.  She has good pelvic floor support.  I do not see a  cystocele, other then a small grade 1  relaxation of the posterior  bladder base.  There is no enterocele or rectocele noted.  She does have  a significant atrophic vaginitis.  Following this, cystoscope was  placed, and the bladder appears within normal limits with no evidence of  bladder stone, tumor or diverticular formation.  Because of the  patient's low leak point pressure and incontinence her urethra is  injected with Macroplastique, two vials.  There is excellent closure of  the urethral submucosa.  The patient was then awakened and taken to the  recovery room in good condition.      Sigmund I. Patsi Sears, M.D.  Electronically Signed    SIT/MEDQ  D:  03/24/2009  T:  03/24/2009  Job:  [id number]

## 2011-04-13 NOTE — Op Note (Signed)
NAMEOLUWATOSIN, BRACY                            ACCOUNT NO.:  [id number]   MEDICAL RECORD NO.:  [id number]                   PATIENT TYPE:  OIB   LOCATION:  5707                                 FACILITY:  [facility]   PHYSICIAN:  Jimmye Norman III, M.D.               DATE OF BIRTH:  1933-08-11   DATE OF PROCEDURE:  07/16/2003  DATE OF DISCHARGE:                                 OPERATIVE REPORT   PREOPERATIVE DIAGNOSIS:  Symptomatic cholelithiasis and dilated intrahepatic  ducts.   POSTOPERATIVE DIAGNOSIS:  Symptomatic cholelithiasis and dilated  intrahepatic ducts, with possible left hepatic ductal stricture.   PROCEDURE:  Laparoscopic cholecystectomy with intraoperative cholangiogram.   SURGEON:  Jimmye Norman, M.D.   ASSISTANT:  Magnus Ivan, R.N.F.A.   ANESTHESIA:  General endotracheal.   ESTIMATED BLOOD LOSS:  Less than 30 mL.   COMPLICATIONS:  None.   CONDITION:  Good.   INDICATIONS FOR OPERATION:  The patient is a 77 year old female who has  abdominal pain and known stones from her KUB and also ultrasound, also has  dilated intrahepatic ducts but normal liver function tests.  She comes in  for elective laparoscopic cholecystectomy with cholangiogram.   FINDINGS:  The patient has some abnormal scarring above the gallbladder bed  on initial examination.  The gallbladder itself was not acutely inflamed,  but there were several adhesions of omentum and periduodenal tissue around  the inferior and posterior portion of the liver.  Cholangiogram showed  initially no flow into the duodenum; however, up on a delay it emptied well  without Glucagon administration.  There was no evidence of a common duct  stone.  At the intake of farthest-most left intrahepatic biliary duct, there  appeared to be a stricture as it entered into an accessory left hepatic  duct. There was no other further stricturing, and the proximal portion of  that appeared to be somewhat strictured.  It is unknown  what the etiology  is, and no direct visualization of that was possible at the time of surgery.   INDICATION FOR OPERATION:  The patient is a 77 year old with abdominal pain  and known stones, who comes in for an elective laparoscopic cholecystectomy.   OPERATION:  The patient was taken to the operating room and placed on the  table in the supine position.  After an adequate endotracheal anesthetic was  administered, she was prepped and draped in the usual sterile manner  exposing the midline and the right upper quadrant.   Because the patient had previous surgery including a hysterectomy and  appendectomy, it was elected to do an open Hasson entrance.  We made a  supraumbilical curvilinear incision using a #11 blade, taken down to the  midline fascia.  We grasped this fascia with a Kocher clamp and then with  two Kocher clamps attached to the fascia, we were able to make a small  entrance into the fascia using a #11 blade and then subsequently enlarge it  using a Kelly clamp, which we were able to push bluntly into the peritoneal  cavity.  We could use our finger to dissect away any attachments and then  subsequently put in a pursestring suture of 0 Vicryl, which allowed [place] to  secure the Hasson cannula, which was subsequently passed into the peritoneal  cavity with minimal difficulty.  With the Hasson in place we insufflated the  abdomen up to a maximum intra-abdominal pressure of 15 mmHg.  This was done  without difficulty.  We then were able to pass the laparoscope with the  attached camera and light source through the Hasson cannula into the  peritoneal cavity and observed two right costal margin 5 mm cannulas being  passed into the peritoneal cavity under direct vision.  A subxiphoid 11/12  mm cannula was passed also into the peritoneal cavity.  We could immediately  see the scarring of the pericholecystic hepatic tissue just above the  gallbladder fossa.  A picture of this is left  on the chart.   We grasped the gallbladder using a ratcheted grasper through the lateralmost  5 mm cannula and then subsequently dissected out the peritoneal attachments  to the posterior lateral and posterior inferior aspect of the liver.  This  was done with minimal difficulty and then subsequently we retracted the  gallbladder toward the anterior abdominal wall and dissected out the  peritoneum overlying the triangle of Calot and the hepatoduodenal triangle.  This allowed exposure of the cystic duct and cystic artery.  The artery  appeared to course directly and parallel to the cystic duct.  We endoclipped  it proximally and distally and then transected it, dissecting out the cystic  duct.  Once we had the cystic duct adequately dissected out, we placed a  clip along the gallbladder side, then made a cholecystodochotomy using the  endoscopic scissors.  The bile came out of this under pressure but appeared  to be clear bile, and no stones were with it.  We subsequently passed a  Reddick catheter into the cholecystodochotomy, securing it in place with an  Endoclip.  It was through this that we did a cholangiogram, which was  described previously.  We subsequently removed the cholangiocatheter,  endoclipped the distal portion of the cystic duct with triple Endoclips, and  then subsequently transected the duct.  We then dissected the gallbladder  out of its bed with minimal difficulty and no entrance into the gallbladder  using an Endo-Hook and also a spatula dissector.  Electrocautery was used to  attain adequate hemostasis.  Once we had it completely freed, we used an  EndoCatch bag to pull it out through the supraumbilical fascial site and  then closed the site using the pursestring suture which had been left in  place.  We irrigated with 2 L of warm saline solution in the gallbladder  fossa.  It was clear at the time of closure.  We aspirated above the liver edge.  We removed all  cannulas and cut off the gas.   The skin was closed using a running subcuticular stitch of 4-0 Vicryl at all  sites and 0.25% Marcaine with epinephrine was injected at all sites.  Sterile dressings were applied.  All needle counts, sponge counts, and  instrument counts were correct.  Kathrin Ruddy, M.D.    JW/MEDQ  D:  07/16/2003  T:  07/17/2003  Job:  [id number]   cc:   Bernette Redbird, M.D.  [address]  [place], [place] [address]  Fax: [phone]   Idamae Schuller A. Milinda Antis, M.D. [facility]

## 2011-04-13 NOTE — Op Note (Signed)
Carla Kelly, Carla Kelly                            ACCOUNT NO.:  [id number]   MEDICAL RECORD NO.:  [id number]                   PATIENT TYPE:  OBV   LOCATION:  0369                                 FACILITY:  [facility]   PHYSICIAN:  Sigmund I. Patsi Sears, M.D.         DATE OF BIRTH:  [birth year]   DATE OF PROCEDURE:  03/27/2004  DATE OF DISCHARGE:                                 OPERATIVE REPORT   PREOPERATIVE DIAGNOSIS:  Urinary incontinence.   POSTOPERATIVE DIAGNOSIS:  Urinary incontinence.   OPERATION:  Mentor Transvaginal obturator sling.   SURGEON:  Sigmund I. Patsi Sears, M.D.   ASSISTANT:  Terri Piedra, N.P.   REVIEW OF HISTORY:  This patient is a 78 year old female with a history of  anterior vaginal vault repair in 1989 for incontinence, and status post  hysterectomy in 1970.  The patient complains of urinary leakage, with  cough/sneeze, and without awareness.  She wears 4 pads a day, and wears a  pad at nighttime to prevent leakage.  She is para 4,3,1.  She has a Valsalva  leak point pressure measured at approximately 0 with 300 mL capacity and a  stable bladder.   The patient originally went to have a possible left oophorectomy, but is  scheduled today for pubovaginal sling only.   DESCRIPTION OF PROCEDURE:  After appropriate preoperative anesthesia, the  patient was brought to the operating room and placed on the operating table  in the dorsal supine position where general endotracheal anesthesia was  introduced.  She was then replaced in the dorsal lithotomy position where  the pubis was prepped with Betadine solution and draped in the usual  fashion.   PROCEDURE IN DETAIL:  In the dorsal lithotomy position, evaluation showed  that the patient had moderate to severe thinning of the anterior vaginal  wall, with scarring within from previous anterior vaginal vault repair.  Six  mL of Marcaine 0.25% plain was injected into the anterior pubovaginal and  cervical arch tissue,  and a 2 cm incision is made in the midline over the  urethra.  The subcutaneous tissue is then dissected bilaterally to the level  of the retropubic space.  Two separate stab wounds are then made 5 cm  lateral to the clitoris, and using the transobturator instrumentation, the  transobturator tape was placed.  Cystoscopy revealed no evidence of bladder  interference.  The bladder was redrained, and the wound was closed in two  full layers with 3-0 Vicryl interrupted sutures.  The patient was given 15  mg of IV Toradol at the end of the case.  She had previously been given a  B&O suppository.  She was awakened and taken to the recovery room in good  condition.  Packing was placed, with estrogen.  The patient was awakened and  taken to the recovery room in good condition.  Sigmund I. Patsi Sears, M.D.   SIT/MEDQ  D:  03/27/2004  T:  03/27/2004  Job:  [id number]   cc:   Marne A. Milinda Antis, M.D. [facility]

## 2011-06-11 ENCOUNTER — Other Ambulatory Visit: Payer: Self-pay | Admitting: *Deleted

## 2011-06-11 MED ORDER — HYDROCODONE-ACETAMINOPHEN 10-650 MG PO TABS: 1.0000 | ORAL_TABLET | Freq: Two times a day (BID) | ORAL | Status: AC

## 2011-06-11 NOTE — Telephone Encounter (Signed)
Px written for call in   

## 2011-06-11 NOTE — Telephone Encounter (Signed)
Medication phoned to [facility] as instructed.  

## 2011-07-18 ENCOUNTER — Other Ambulatory Visit: Payer: Self-pay

## 2011-07-18 MED ORDER — HYDROCODONE-ACETAMINOPHEN 10-650 MG PO TABS: 1.0000 | ORAL_TABLET | Freq: Two times a day (BID) | ORAL | Status: DC | PRN

## 2011-07-18 NOTE — Telephone Encounter (Signed)
[facility] faxed refill request for Hydrocodone-Acetaminophen 10-650 mg taking 1 tablet by mouth twice daily as needed for pain. Unable to reach pt but pharmacist thinks pt is out of med. Since Dr Milinda Antis is out of office and is not on computer today please advise.

## 2011-07-18 NOTE — Telephone Encounter (Signed)
Medication phoned to [facility] as instructed.  

## 2011-07-18 NOTE — Telephone Encounter (Signed)
Please call in and have further rxs come through Dr. Milinda Antis.  Thanks.

## 2011-07-24 ENCOUNTER — Other Ambulatory Visit: Payer: Self-pay

## 2011-07-24 MED ORDER — DIAZEPAM 5 MG PO TABS: 5.0000 mg | ORAL_TABLET | Freq: Three times a day (TID) | ORAL | Status: DC | PRN

## 2011-07-24 NOTE — Telephone Encounter (Signed)
[facility]ed refill request for Diazepam 5mg  quantity requested #90. Please advise. Pt already scheduled for CPX with Dr Milinda Antis on 09/24/11.

## 2011-07-24 NOTE — Telephone Encounter (Signed)
Medication phoned to [facility] as instructed.  

## 2011-07-24 NOTE — Telephone Encounter (Signed)
Px written for call in   

## 2011-08-06 ENCOUNTER — Other Ambulatory Visit: Payer: Self-pay

## 2011-08-06 MED ORDER — HYDROCODONE-ACETAMINOPHEN 10-650 MG PO TABS: 1.0000 | ORAL_TABLET | Freq: Two times a day (BID) | ORAL | Status: DC | PRN

## 2011-08-06 NOTE — Telephone Encounter (Signed)
[facility] faxed refill request Hydrocodone Acetaminophen 10-650 mg. Spoke with pt and she has enough med to last til end of week.Please advise.

## 2011-08-06 NOTE — Telephone Encounter (Signed)
Medication phoned to [facility] as instructed.  

## 2011-08-22 ENCOUNTER — Other Ambulatory Visit: Payer: Self-pay | Admitting: Family Medicine

## 2011-08-22 DIAGNOSIS — Z1231 Encounter for screening mammogram for malignant neoplasm of breast: Secondary | ICD-10-CM

## 2011-08-30 ENCOUNTER — Ambulatory Visit (INDEPENDENT_AMBULATORY_CARE_PROVIDER_SITE_OTHER): Payer: Medicare Other

## 2011-08-30 DIAGNOSIS — Z23 Encounter for immunization: Secondary | ICD-10-CM

## 2011-08-31 LAB — CREATININE, SERUM
Creatinine, Ser: 0.71 mg/dL (ref 0.4–1.2)
GFR calc non Af Amer: >60 mL/min (ref >60)

## 2011-09-10 ENCOUNTER — Other Ambulatory Visit: Payer: Self-pay | Admitting: *Deleted

## 2011-09-10 MED ORDER — HYDROCODONE-ACETAMINOPHEN 10-650 MG PO TABS: 1.0000 | ORAL_TABLET | Freq: Two times a day (BID) | ORAL | Status: DC | PRN

## 2011-09-10 NOTE — Telephone Encounter (Signed)
Last refill 08/06/2011. 

## 2011-09-10 NOTE — Telephone Encounter (Signed)
Medication phoned to [facility] as instructed.  

## 2011-09-10 NOTE — Telephone Encounter (Signed)
Px written for call in   

## 2011-09-11 ENCOUNTER — Other Ambulatory Visit: Payer: Self-pay

## 2011-09-11 NOTE — Telephone Encounter (Signed)
I think I did this yesterday

## 2011-09-11 NOTE — Telephone Encounter (Signed)
[facility] faxed refill request for Hydrocodone APAP 10/650 mg. Last filled 08/06/11. Pt already has CPX scheduled 09/24/11.Please advise.

## 2011-09-17 ENCOUNTER — Other Ambulatory Visit (INDEPENDENT_AMBULATORY_CARE_PROVIDER_SITE_OTHER): Payer: Medicare Other

## 2011-09-17 ENCOUNTER — Telehealth: Payer: Self-pay | Admitting: Family Medicine

## 2011-09-17 DIAGNOSIS — E559 Vitamin D deficiency, unspecified: Secondary | ICD-10-CM

## 2011-09-17 DIAGNOSIS — E538 Deficiency of other specified B group vitamins: Secondary | ICD-10-CM

## 2011-09-17 DIAGNOSIS — R5383 Other fatigue: Secondary | ICD-10-CM

## 2011-09-17 DIAGNOSIS — M949 Disorder of cartilage, unspecified: Secondary | ICD-10-CM

## 2011-09-17 DIAGNOSIS — R5381 Other malaise: Secondary | ICD-10-CM

## 2011-09-17 DIAGNOSIS — E785 Hyperlipidemia, unspecified: Secondary | ICD-10-CM

## 2011-09-17 DIAGNOSIS — E039 Hypothyroidism, unspecified: Secondary | ICD-10-CM

## 2011-09-17 DIAGNOSIS — M899 Disorder of bone, unspecified: Secondary | ICD-10-CM

## 2011-09-17 DIAGNOSIS — Z87898 Personal history of other specified conditions: Secondary | ICD-10-CM

## 2011-09-17 LAB — CBC WITH DIFFERENTIAL/PLATELET
Basophils Absolute: 0 10*3/uL (ref 0.0–0.1)
Basophils Relative: 0.7 % (ref 0.0–3.0)
Eosinophils Absolute: 0.2 10*3/uL (ref 0.0–0.7)
MCHC: 33.9 g/dL (ref 30.0–36.0)
MCV: 96.2 fl (ref 78.0–100.0)
Monocytes Absolute: 0.3 10*3/uL (ref 0.1–1.0)
Neutro Abs: 1.6 10*3/uL (ref 1.4–7.7)
Neutrophils Relative %: 33.2 % — ABNORMAL LOW (ref 43.0–77.0)
RBC: 3.71 Mil/uL — ABNORMAL LOW (ref 3.87–5.11)
RDW: 13.3 % (ref 11.5–14.6)

## 2011-09-17 LAB — LIPID PANEL
HDL: 73.6 mg/dL (ref >39.00)
Total CHOL/HDL Ratio: 3
Triglycerides: 69 mg/dL (ref 0.0–149.0)

## 2011-09-17 LAB — COMPREHENSIVE METABOLIC PANEL
ALT: 18 U/L (ref 0–35)
AST: 26 U/L (ref 0–37)
Alkaline Phosphatase: 56 U/L (ref 39–117)
BUN: 15 mg/dL (ref 6–23)
Creatinine, Ser: 0.9 mg/dL (ref 0.4–1.2)
Potassium: 4 mEq/L (ref 3.5–5.1)

## 2011-09-17 LAB — TSH: TSH: 1.15 u[IU]/mL (ref 0.35–5.50)

## 2011-09-17 NOTE — Telephone Encounter (Signed)
Message copied by Judy Pimple on Mon Sep 17, 2011  7:39 AM ------      Message from: Carla Kelly      Created: Wed Sep 12, 2011  8:05 AM      Regarding: Cpx labs Mon 10/22       Please order  future cpx labs for pt's upcomming lab appt.      Thanks      Rodney Booze

## 2011-09-21 ENCOUNTER — Encounter: Payer: Self-pay | Admitting: Family Medicine

## 2011-09-24 ENCOUNTER — Ambulatory Visit (INDEPENDENT_AMBULATORY_CARE_PROVIDER_SITE_OTHER): Payer: Medicare Other | Admitting: Family Medicine

## 2011-09-24 ENCOUNTER — Encounter: Payer: Self-pay | Admitting: Family Medicine

## 2011-09-24 VITALS — BP 118/60 | HR 76 | Temp 98.0°F | Ht 67.0 in | Wt 147.0 lb

## 2011-09-24 DIAGNOSIS — M899 Disorder of bone, unspecified: Secondary | ICD-10-CM

## 2011-09-24 DIAGNOSIS — M949 Disorder of cartilage, unspecified: Secondary | ICD-10-CM

## 2011-09-24 DIAGNOSIS — E039 Hypothyroidism, unspecified: Secondary | ICD-10-CM

## 2011-09-24 DIAGNOSIS — E538 Deficiency of other specified B group vitamins: Secondary | ICD-10-CM

## 2011-09-24 DIAGNOSIS — E785 Hyperlipidemia, unspecified: Secondary | ICD-10-CM

## 2011-09-24 DIAGNOSIS — E162 Hypoglycemia, unspecified: Secondary | ICD-10-CM

## 2011-09-24 DIAGNOSIS — E559 Vitamin D deficiency, unspecified: Secondary | ICD-10-CM

## 2011-09-24 MED ORDER — LEVOTHYROXINE SODIUM 88 MCG PO TABS: 88.0000 ug | ORAL_TABLET | Freq: Every day | ORAL | Status: DC

## 2011-09-24 MED ORDER — DIAZEPAM 5 MG PO TABS: 5.0000 mg | ORAL_TABLET | Freq: Three times a day (TID) | ORAL | Status: DC | PRN

## 2011-09-24 NOTE — Patient Instructions (Signed)
If you are interested in a shingles vaccine - please check about insurance coverage and then call us for a px to send to your pharmacy We will schedule your bone density test at check out  Continue calcium and vitamin d and exercise for bone health  I'm glad your weight is up No change in medicines

## 2011-09-24 NOTE — Progress Notes (Signed)
Subjective:    Patient ID: Carla Kelly, female    DOB: 12/15/1932, 77 y.o.   MRN: [id number]  HPI Here for annual check up of chronic medical problems and to review health mt list Has been doing pretty well overall  She is working a bit now  Her family moved out -- finally back at home and more peaceful  bp 110/60 - good  Wt is up 12 lb- which is great with bmi of 23 Is feeling better about that -- is eating 5 times per day and supplementing with boost   Tot hyst in past- still has her L ovary  Hx of atrophic vag change No gyn symptoms  Has chronic incontinence problems - and sees urologist for that (Dr Patsi Sears)  Lipids- LDL 128 with good HDL Lab Results  Component Value Date   CHOL 212* 09/17/2011   CHOL 193 09/13/2010   CHOL 173 08/29/2009   Lab Results  Component Value Date   HDL 73.60 09/17/2011   HDL 63.40 09/13/2010   HDL 52.80 08/29/2009   Lab Results  Component Value Date   LDLCALC 120* 09/13/2010   LDLCALC 106* 08/29/2009   Lab Results  Component Value Date   TRIG 69.0 09/17/2011   TRIG 50.0 09/13/2010   TRIG 73.0 08/29/2009   Lab Results  Component Value Date   CHOLHDL 3 09/17/2011   CHOLHDL 3 09/13/2010   CHOLHDL 3 08/29/2009   Lab Results  Component Value Date   LDLDIRECT 128.3 09/17/2011   LDLDIRECT 161.4 08/25/2008   LDLDIRECT 134.8 03/18/2007   overall improved-is eating healthy foods and avoiding sat fats (despite eating more )    B12 level is theraputic 367 Energy level is better with supplementation  Zoster status- has had shingles several times  She has not checked her ins coverage yet    Td 04  colonosc 7/11 - due 5 years for polyps  Pneumovax 09 Had her flu shot  Mam 11/11 normal - has it set up already Self exam - no lumps or changes   Osteopenia Ca and D dexa 10/10-- due for her 2 year check and wants to set that up - would like to go to the breast center  Vit D tx at 47  No fractures or pain  No loss of  ht  Hypothyroid-no recent dose change Lab Results  Component Value Date   TSH 1.15 09/17/2011   theraputic and with no clinical changes   Patient Active Problem List  Diagnoses  . HYPOTHYROIDISM  . HYPOGLYCEMIA  . B12 DEFICIENCY  . UNSPECIFIED VITAMIN D DEFICIENCY  . HYPERLIPIDEMIA  . EMBOLISM  . ASTHMA  . IRRITABLE BOWEL SYNDROME  . VAGINITIS, ATROPHIC  . Osteoarth NOS-Unspec  . FIBROMYALGIA  . OSTEOPENIA  . FATIGUE  . WEIGHT LOSS, ABNORMAL  . URINARY INCONTINENCE  . Personal History of Colonic Polyps  . HERPES ZOSTER, HX OF   Past Medical History  Diagnosis Date  . Asthma   . Hypothyroidism   . Osteoarthritis   . IBS (irritable bowel syndrome)   . Fibromyalgia   . History of herpes zoster   . HLD (hyperlipidemia)   . Adenomatous colon polyp   . Other B-complex deficiencies   . Osteoarthrosis, unspecified whether generalized or localized, unspecified site   . Embolism and thrombosis of unspecified site   . Disorder of bone and cartilage, unspecified   . Unspecified vitamin D deficiency   . Unspecified urinary incontinence    Past  Surgical History  Procedure Date  . Abdominal hysterectomy 1970    fibroids; endometriosis  . Cervical disc surgery     x 2  . Colonoscopy 2/02    polyps, BE-polyps  . US transvaginal pelvic modified 10/02    small left ovarian cyst  . Cholecystectomy 9/04  . Bladder tack 5/05   History  Substance Use Topics  . Smoking status: Never Smoker   . Smokeless tobacco: Never Used  . Alcohol Use: No     "is allergic"   Family History  Problem Relation Age of Onset  . Heart attack Mother   . Stroke Mother     several  . Lupus Brother   . Other Sister     benign brain tumor  . Asthma Daughter   . Other Brother     heart problems  . Colon cancer      1st cousin   Allergies  Allergen Reactions  . Ergocalciferol     REACTION: GI side eff  . Morphine     REACTION: ? reaction  . Nortriptyline Hcl     REACTION: ?  reaction  . Tolterodine Tartrate     REACTION: ? reaction   Current Outpatient Prescriptions on File Prior to Visit  Medication Sig Dispense Refill  . albuterol (PROVENTIL HFA;VENTOLIN HFA) 108 (90 BASE) MCG/ACT inhaler Inhale 2 puffs into the lungs every 4 (four) hours as needed.        Marland Kitchen albuterol (PROVENTIL) 4 MG tablet Take 4 mg by mouth every 4 (four) hours as needed.        Marland Kitchen aspirin 325 MG tablet Take 325 mg by mouth daily as needed.        . Calcium 1500 MG tablet Take 1,500 mg by mouth daily.        . furosemide (LASIX) 40 MG tablet Take 40 mg by mouth daily as needed.        Marland Kitchen HYDROcodone-acetaminophen (LORCET) 10-650 MG per tablet Take 1 tablet by mouth 2 (two) times daily as needed.  60 tablet  3  . Multiple Vitamin (MULTIVITAMIN) tablet Take 1 tablet by mouth daily.        Marland Kitchen DISCONTD: levothyroxine (SYNTHROID, LEVOTHROID) 88 MCG tablet Take 88 mcg by mouth daily.            Review of Systems Review of Systems  Constitutional: Negative for fever, appetite change, fatigue and unexpected weight change.  Eyes: Negative for pain and visual disturbance.  Respiratory: Negative for cough and shortness of breath.   Cardiovascular: Negative for cp or palpitations    Gastrointestinal: Negative for nausea, diarrhea and constipation.  Genitourinary: Negative for urgency and frequency.  Skin: Negative for pallor or rash   Neurological: Negative for weakness, light-headedness, numbness and headaches.  Hematological: Negative for adenopathy. Does not bruise/bleed easily.  Psychiatric/Behavioral: Negative for dysphoric mood. The patient is not nervous/anxious.  - she does stay very stressed         Objective:   Physical Exam  Constitutional: She appears well-developed and well-nourished. No distress.  HENT:  Head: Normocephalic and atraumatic.  Right Ear: External ear normal.  Left Ear: External ear normal.  Nose: Nose normal.  Mouth/Throat: Oropharynx is clear and moist.  Eyes:  Conjunctivae and EOM are normal. Pupils are equal, round, and reactive to light. No scleral icterus.  Neck: Normal range of motion. Neck supple. No JVD present. Carotid bruit is not present. No thyromegaly present.  Cardiovascular: Normal rate, regular  rhythm, normal heart sounds and intact distal pulses.  Exam reveals no gallop.   Pulmonary/Chest: Effort normal and breath sounds normal. No respiratory distress. She has no wheezes.  Abdominal: Soft. Bowel sounds are normal. She exhibits no distension, no abdominal bruit and no mass. There is no tenderness.  Genitourinary: No breast swelling, tenderness, discharge or bleeding.  Musculoskeletal: Normal range of motion. She exhibits no edema and no tenderness.  Lymphadenopathy:    She has no cervical adenopathy.  Neurological: She is alert. She has normal reflexes. No cranial nerve deficit. Coordination normal.  Skin: Skin is warm and dry. No rash noted. No erythema. No pallor.  Psychiatric: She has a normal mood and affect.          Assessment & Plan:

## 2011-09-24 NOTE — Assessment & Plan Note (Signed)
Level in nl range with supplementation  Better energy level

## 2011-09-24 NOTE — Assessment & Plan Note (Signed)
theraputic tsh and no clinical changes No change in dose Refilled

## 2011-09-24 NOTE — Assessment & Plan Note (Signed)
No problems now with more frequent meals

## 2011-09-24 NOTE — Assessment & Plan Note (Signed)
Stable with diet control Disc goals for cholesterol Rev low sat fat diet

## 2011-09-24 NOTE — Assessment & Plan Note (Signed)
Nl D level with current supplementation Disc imp of that to bone and general health

## 2011-09-24 NOTE — Assessment & Plan Note (Signed)
Due for 2 y dexa Will schedule that Vit D level is ok  Rev ca and D, exercise and safety

## 2011-09-27 ENCOUNTER — Ambulatory Visit
Admission: RE | Admit: 2011-09-27 | Discharge: 2011-09-27 | Disposition: A | Discharge status: Home / Self Care | Payer: Medicare Other | Source: Ambulatory Visit | Attending: Family Medicine | Admitting: Family Medicine

## 2011-09-27 DIAGNOSIS — Z1231 Encounter for screening mammogram for malignant neoplasm of breast: Secondary | ICD-10-CM

## 2011-09-27 DIAGNOSIS — M899 Disorder of bone, unspecified: Secondary | ICD-10-CM

## 2011-09-27 DIAGNOSIS — M949 Disorder of cartilage, unspecified: Secondary | ICD-10-CM

## 2011-10-04 ENCOUNTER — Encounter: Payer: Self-pay | Admitting: *Deleted

## 2011-10-16 ENCOUNTER — Encounter: Payer: Self-pay | Admitting: Family Medicine

## 2011-12-04 ENCOUNTER — Other Ambulatory Visit: Payer: Self-pay | Admitting: *Deleted

## 2011-12-04 MED ORDER — DIAZEPAM 5 MG PO TABS: 5.0000 mg | ORAL_TABLET | Freq: Three times a day (TID) | ORAL | Status: DC | PRN

## 2011-12-04 NOTE — Telephone Encounter (Signed)
Px written for call in   

## 2011-12-04 NOTE — Telephone Encounter (Signed)
Medication phoned to [facility] as instructed.  

## 2011-12-04 NOTE — Telephone Encounter (Signed)
[facility] faxed refill request Diazepam 5 mg. Last filled 09/27/11. Pt last seen 09-24-11.

## 2011-12-14 ENCOUNTER — Ambulatory Visit (INDEPENDENT_AMBULATORY_CARE_PROVIDER_SITE_OTHER): Payer: Medicare Other | Admitting: Family Medicine

## 2011-12-14 ENCOUNTER — Encounter: Payer: Self-pay | Admitting: Family Medicine

## 2011-12-14 VITALS — BP 140/62 | HR 80 | Temp 97.7°F | Wt 150.2 lb

## 2011-12-14 DIAGNOSIS — M25519 Pain in unspecified shoulder: Secondary | ICD-10-CM

## 2011-12-14 DIAGNOSIS — M25511 Pain in right shoulder: Secondary | ICD-10-CM | POA: Insufficient documentation

## 2011-12-14 MED ORDER — MELOXICAM 15 MG PO TABS: 15.0000 mg | ORAL_TABLET | Freq: Every day | ORAL | Status: DC

## 2011-12-14 NOTE — Patient Instructions (Addendum)
Ice your shoulder for 10 minutes at a time  Do the finger crawl on the wall exercise to keep it from stiffening up  Try mobic daily for 2 weeks maximum- if GI upset , stop it  If in 1-2 weeks you are worse or not improved , call to get appt with Dr Patsy Lager here

## 2011-12-14 NOTE — Assessment & Plan Note (Signed)
Suspect bursitis- less likely rotator cuff tear rom is acutally fair Will tx conservatively with passive rom exercise and also mobic if tolerated If not imp 1-2 wk will f/u for appt with sports med to consider ? Injection if it would help

## 2011-12-14 NOTE — Progress Notes (Signed)
Subjective:    Patient ID: Carla Kelly, female    DOB: [birth year], 78 y.o.   MRN: [id number]  HPI R shoulder is hurting - since end of December Nagging and worse and avoiding use of it  Suspects bursitis  Anterior and lateral  Really hurts to internally rotate and pain - to abduct over 90 deg Dull pain  Shoots down to middle 3 fingers   (hx of ruptured disc in neck)   ? Injury-- had carried some heavy boxes and putting up shelves   Can take nsaids  No numbness or weakness   Patient Active Problem List  Diagnoses  . HYPOTHYROIDISM  . HYPOGLYCEMIA  . B12 DEFICIENCY  . UNSPECIFIED VITAMIN D DEFICIENCY  . HYPERLIPIDEMIA  . EMBOLISM  . ASTHMA  . IRRITABLE BOWEL SYNDROME  . VAGINITIS, ATROPHIC  . Osteoarth NOS-Unspec  . FIBROMYALGIA  . OSTEOPENIA  . FATIGUE  . URINARY INCONTINENCE  . Personal History of Colonic Polyps  . HERPES ZOSTER, HX OF  . Right shoulder pain   Past Medical History  Diagnosis Date  . Asthma   . Hypothyroidism   . Osteoarthritis   . IBS (irritable bowel syndrome)   . Fibromyalgia   . History of herpes zoster   . HLD (hyperlipidemia)   . Adenomatous colon polyp   . Other B-complex deficiencies   . Osteoarthrosis, unspecified whether generalized or localized, unspecified site   . Embolism and thrombosis of unspecified site   . Disorder of bone and cartilage, unspecified   . Unspecified vitamin D deficiency   . Unspecified urinary incontinence    Past Surgical History  Procedure Date  . Abdominal hysterectomy 1970    fibroids; endometriosis  . Cervical disc surgery     x 2  . Colonoscopy 2/02    polyps, BE-polyps  . US transvaginal pelvic modified 10/02    small left ovarian cyst  . Cholecystectomy 9/04  . Bladder tack 5/05   History  Substance Use Topics  . Smoking status: Never Smoker   . Smokeless tobacco: Never Used  . Alcohol Use: No     "is allergic"   Family History  Problem Relation Age of Onset  . Heart attack Mother    . Stroke Mother     several  . Lupus Brother   . Other Sister     benign brain tumor  . Asthma Daughter   . Other Brother     heart problems  . Colon cancer      1st cousin   Allergies  Allergen Reactions  . Ergocalciferol     REACTION: GI side eff  . Morphine     REACTION: ? reaction  . Nortriptyline Hcl     REACTION: ? reaction  . Tolterodine Tartrate     REACTION: ? reaction   Current Outpatient Prescriptions on File Prior to Visit  Medication Sig Dispense Refill  . albuterol (PROVENTIL HFA;VENTOLIN HFA) 108 (90 BASE) MCG/ACT inhaler Inhale 2 puffs into the lungs every 4 (four) hours as needed.        Marland Kitchen albuterol (PROVENTIL) 4 MG tablet Take 4 mg by mouth every 4 (four) hours as needed.        Marland Kitchen aspirin 325 MG tablet Take 325 mg by mouth daily as needed.        . Calcium 1500 MG tablet Take 1,500 mg by mouth daily.        . diazepam (VALIUM) 5 MG tablet Take 1 tablet (  5 mg total) by mouth 3 (three) times daily as needed.  90 tablet  0  . diclofenac (FLECTOR) 1.3 % PTCH Place 1 patch onto the skin as needed.        . furosemide (LASIX) 40 MG tablet Take 40 mg by mouth daily as needed.        Marland Kitchen HYDROcodone-acetaminophen (LORCET) 10-650 MG per tablet Take 1 tablet by mouth 2 (two) times daily as needed.  60 tablet  3  . levothyroxine (SYNTHROID, LEVOTHROID) 88 MCG tablet Take 1 tablet (88 mcg total) by mouth daily.  30 tablet  11  . Multiple Vitamin (MULTIVITAMIN) tablet Take 1 tablet by mouth daily.            Review of Systems Review of Systems  Constitutional: Negative for fever, appetite change, fatigue and unexpected weight change.  Eyes: Negative for pain and visual disturbance.  Respiratory: Negative for cough and shortness of breath.   Cardiovascular: Negative for cp or palpitations    Gastrointestinal: Negative for nausea, diarrhea and constipation.  Genitourinary: Negative for urgency and frequency.  Skin: Negative for pallor or rash   MSK pos for shoulder  pain  Neurological: Negative for weakness, light-headedness, numbness and headaches.  Hematological: Negative for adenopathy. Does not bruise/bleed easily.  Psychiatric/Behavioral: Negative for dysphoric mood. The patient is not nervous/anxious.          Objective:   Physical Exam  Constitutional: She appears well-developed and well-nourished. No distress.  Eyes: Conjunctivae and EOM are normal. Pupils are equal, round, and reactive to light.  Neck: Normal range of motion. Neck supple. No JVD present. No thyromegaly present.  Cardiovascular: Normal rate and regular rhythm.   Pulmonary/Chest: Effort normal and breath sounds normal.  Musculoskeletal: She exhibits tenderness. She exhibits no edema.       Right shoulder: She exhibits decreased range of motion, tenderness and bony tenderness. She exhibits no swelling, no effusion, no crepitus, no deformity, no spasm, normal pulse and normal strength.       Pain to abduct over 90 deg Pos hawking/ neer tests  Pain on ext rotation  Fair rom  Tender over acromion  Lymphadenopathy:    She has no cervical adenopathy.  Neurological: She is alert. She has normal strength and normal reflexes. No sensory deficit.  Skin: Skin is warm and dry. No erythema.  Psychiatric: She has a normal mood and affect.          Assessment & Plan:

## 2012-01-09 ENCOUNTER — Other Ambulatory Visit: Payer: Self-pay | Admitting: Family Medicine

## 2012-01-09 NOTE — Telephone Encounter (Signed)
Will refill electronically  

## 2012-02-04 ENCOUNTER — Other Ambulatory Visit: Payer: Self-pay | Admitting: *Deleted

## 2012-02-04 MED ORDER — DIAZEPAM 5 MG PO TABS: 5.0000 mg | ORAL_TABLET | Freq: Three times a day (TID) | ORAL | Status: DC | PRN

## 2012-02-04 NOTE — Telephone Encounter (Signed)
Px written for call in   

## 2012-02-04 NOTE — Telephone Encounter (Signed)
Received faxed refill request from pharmacy for Diazepam. Is it okay to refill medication?

## 2012-02-05 NOTE — Telephone Encounter (Signed)
Medication phoned to [facility] as instructed.  

## 2012-02-08 ENCOUNTER — Other Ambulatory Visit: Payer: Self-pay

## 2012-02-08 MED ORDER — HYDROCODONE-ACETAMINOPHEN 10-650 MG PO TABS: 1.0000 | ORAL_TABLET | Freq: Two times a day (BID) | ORAL | Status: DC | PRN

## 2012-02-08 NOTE — Telephone Encounter (Signed)
Medication phoned to [facility] pharmacy as instructed.  

## 2012-02-08 NOTE — Telephone Encounter (Signed)
Px written for call in   

## 2012-02-08 NOTE — Telephone Encounter (Signed)
[facility] faxed refill Hydrocodone APAP 10-650 mg. Pt last seen 12/14/11.Please advise.

## 2012-02-12 ENCOUNTER — Other Ambulatory Visit: Payer: Self-pay | Admitting: *Deleted

## 2012-02-12 MED ORDER — ALBUTEROL SULFATE 4 MG PO TABS: 4.0000 mg | ORAL_TABLET | ORAL | Status: DC | PRN

## 2012-03-11 ENCOUNTER — Encounter: Payer: Self-pay | Admitting: Family Medicine

## 2012-03-11 ENCOUNTER — Ambulatory Visit (INDEPENDENT_AMBULATORY_CARE_PROVIDER_SITE_OTHER): Payer: Medicare Other | Admitting: Family Medicine

## 2012-03-11 VITALS — BP 122/70 | HR 86 | Temp 97.7°F | Ht 66.75 in | Wt 152.2 lb

## 2012-03-11 DIAGNOSIS — R609 Edema, unspecified: Secondary | ICD-10-CM

## 2012-03-11 DIAGNOSIS — R600 Localized edema: Secondary | ICD-10-CM | POA: Insufficient documentation

## 2012-03-11 DIAGNOSIS — R32 Unspecified urinary incontinence: Secondary | ICD-10-CM

## 2012-03-11 MED ORDER — ESTRADIOL 0.1 MG/GM VA CREA: TOPICAL_CREAM | VAGINAL | Status: DC

## 2012-03-11 NOTE — Progress Notes (Signed)
Subjective:    Patient ID: Carla Kelly, female    DOB: [birth year], 78 y.o.   MRN: [id number]  HPI Here to disc urinary incontinence and swelling  She also had question about sugar balance in her body  She falls asleep easily after eating something sugary  And her ears ring after that  Does have hx of hypoglycemia   Lab Results  Component Value Date   TSH 1.15 09/17/2011     Chemistry      Component Value Date/Time   NA 144 09/17/2011 0858   K 4.0 09/17/2011 0858   CL 106 09/17/2011 0858   CO2 30 09/17/2011 0858   BUN 15 09/17/2011 0858   CREATININE 0.9 09/17/2011 0858      Component Value Date/Time   CALCIUM 9.0 09/17/2011 0858   ALKPHOS 56 09/17/2011 0858   AST 26 09/17/2011 0858   ALT 18 09/17/2011 0858   BILITOT 0.6 09/17/2011 0858      bp is ok  Wt is stable  Urinary incontinence Had bladder tack in 05- , and then something done since then (Dr Patsi Sears) , and also had microplastique ? Injected -- only worked for 2 weeks  She never followed up because she had other issues since then  Has both stress and urge- leaks all the time  Hx of atrophic vaginitis hyst in past  On lasix  No symptoms of uti   Joint swelling  Uses flector patch- only when knee flares up-not all the time  No other nsaid products  Last week when it got hot - her ankles and feet swell  No sob or cp or PND or orthopnea  Has support hose if she needs them   Patient Active Problem List  Diagnoses  . HYPOTHYROIDISM  . HYPOGLYCEMIA  . B12 DEFICIENCY  . UNSPECIFIED VITAMIN D DEFICIENCY  . HYPERLIPIDEMIA  . EMBOLISM  . ASTHMA  . IRRITABLE BOWEL SYNDROME  . VAGINITIS, ATROPHIC  . Osteoarth NOS-Unspec  . FIBROMYALGIA  . OSTEOPENIA  . FATIGUE  . URINARY INCONTINENCE  . Personal History of Colonic Polyps  . HERPES ZOSTER, HX OF  . Right shoulder pain  . Pedal edema   Past Medical History  Diagnosis Date  . Asthma   . Hypothyroidism   . Osteoarthritis   . IBS (irritable bowel  syndrome)   . Fibromyalgia   . History of herpes zoster   . HLD (hyperlipidemia)   . Adenomatous colon polyp   . Other B-complex deficiencies   . Osteoarthrosis, unspecified whether generalized or localized, unspecified site   . Embolism and thrombosis of unspecified site   . Disorder of bone and cartilage, unspecified   . Unspecified vitamin D deficiency   . Unspecified urinary incontinence    Past Surgical History  Procedure Date  . Abdominal hysterectomy 1970    fibroids; endometriosis  . Cervical disc surgery     x 2  . Colonoscopy 2/02    polyps, BE-polyps  . US transvaginal pelvic modified 10/02    small left ovarian cyst  . Cholecystectomy 9/04  . Bladder tack 5/05   History  Substance Use Topics  . Smoking status: Never Smoker   . Smokeless tobacco: Never Used  . Alcohol Use: No     "is allergic"   Family History  Problem Relation Age of Onset  . Heart attack Mother   . Stroke Mother     several  . Lupus Brother   . Other Sister  benign brain tumor  . Asthma Daughter   . Other Brother     heart problems  . Colon cancer      1st cousin   Allergies  Allergen Reactions  . Ergocalciferol     REACTION: GI side eff  . Morphine     REACTION: ? reaction  . Nortriptyline Hcl     REACTION: ? reaction  . Tolterodine Tartrate     REACTION: ? reaction   Current Outpatient Prescriptions on File Prior to Visit  Medication Sig Dispense Refill  . albuterol (PROVENTIL HFA;VENTOLIN HFA) 108 (90 BASE) MCG/ACT inhaler Inhale 2 puffs into the lungs every 4 (four) hours as needed.        Marland Kitchen albuterol (PROVENTIL) 4 MG tablet Take 1 tablet (4 mg total) by mouth every 4 (four) hours as needed.  120 tablet  5  . aspirin 325 MG tablet Take 325 mg by mouth daily as needed.        . Calcium 1500 MG tablet Take 1,500 mg by mouth daily.        . diazepam (VALIUM) 5 MG tablet Take 1 tablet (5 mg total) by mouth 3 (three) times daily as needed.  90 tablet  0  . diclofenac  (FLECTOR) 1.3 % PTCH Place 1 patch onto the skin as needed.        . furosemide (LASIX) 40 MG tablet Take 40 mg by mouth daily as needed.        Marland Kitchen HYDROcodone-acetaminophen (LORCET) 10-650 MG per tablet Take 1 tablet by mouth 2 (two) times daily as needed.  60 tablet  3  . levothyroxine (SYNTHROID, LEVOTHROID) 88 MCG tablet Take 1 tablet (88 mcg total) by mouth daily.  30 tablet  11  . Multiple Vitamin (MULTIVITAMIN) tablet Take 1 tablet by mouth daily.             Review of Systems Review of Systems  Constitutional: Negative for fever, appetite change, fatigue and unexpected weight change.  Eyes: Negative for pain and visual disturbance.  Respiratory: Negative for cough and shortness of breath.  no PND or orthopnea pos for pedal edema  Cardiovascular: Negative for cp or palpitations    Gastrointestinal: Negative for nausea, diarrhea and constipation.  Genitourinary:pos for urgency and frequency. and incontinence , neg for blood in urine or dysuria  Skin: Negative for pallor or rash   Neurological: Negative for weakness, light-headedness, numbness and headaches.  Hematological: Negative for adenopathy. Does not bruise/bleed easily.  Psychiatric/Behavioral: Negative for dysphoric mood. The patient is not nervous/anxious.          Objective:   Physical Exam  Constitutional: She appears well-developed and well-nourished. No distress.  HENT:  Head: Normocephalic and atraumatic.  Mouth/Throat: Oropharynx is clear and moist.  Eyes: Conjunctivae and EOM are normal. Pupils are equal, round, and reactive to light. No scleral icterus.  Neck: Normal range of motion. Neck supple. No JVD present. Carotid bruit is not present. No thyromegaly present.  Cardiovascular: Normal rate, regular rhythm, normal heart sounds and intact distal pulses.  Exam reveals no gallop.   Pulmonary/Chest: Effort normal and breath sounds normal. No respiratory distress. She has no wheezes.       No crackles     Abdominal: Soft. Bowel sounds are normal. She exhibits no distension and no abdominal bruit.  Musculoskeletal: She exhibits edema. She exhibits no tenderness.       Trace edema tops of feet and ankles  No tenderness No color  change  Feet are warm and well perfused  Lymphadenopathy:    She has no cervical adenopathy.  Neurological: She is alert. She has normal reflexes. No cranial nerve deficit. She exhibits normal muscle tone. Coordination normal.  Skin: Skin is warm and dry. No rash noted. No erythema. No pallor.  Psychiatric: She has a normal mood and affect.          Assessment & Plan:

## 2012-03-11 NOTE — Patient Instructions (Signed)
Use the estrace cream on urethral area - small amount daily  We will refer you to urology at check out  Use lasix when needed Wear support stockings when needed also  Update me if no improvement  Avoid salt as much as possible  Elevate legs when sitting when able to

## 2012-03-11 NOTE — Assessment & Plan Note (Signed)
Ongoing/ dependent/ worse in hot weather from venous insuff  No cardiac red flags Will use lasix prn and supp hose  See avs for lifestyle change Continue to follow

## 2012-03-11 NOTE — Assessment & Plan Note (Signed)
No imp with several surg/ proceedures Will try estrace cream daily  urol ref for f/u  Enc kegel exercises No s/s infx

## 2012-04-07 ENCOUNTER — Other Ambulatory Visit: Payer: Self-pay | Admitting: *Deleted

## 2012-04-07 MED ORDER — FUROSEMIDE 40 MG PO TABS: 40.0000 mg | ORAL_TABLET | Freq: Every day | ORAL | Status: DC | PRN

## 2012-04-07 MED ORDER — DIAZEPAM 5 MG PO TABS: 5.0000 mg | ORAL_TABLET | Freq: Three times a day (TID) | ORAL | Status: DC | PRN

## 2012-04-07 NOTE — Telephone Encounter (Signed)
Faxed refill request   

## 2012-04-07 NOTE — Telephone Encounter (Signed)
Px written for call in   

## 2012-04-07 NOTE — Telephone Encounter (Signed)
[facility]

## 2012-06-09 ENCOUNTER — Other Ambulatory Visit: Payer: Self-pay

## 2012-06-09 MED ORDER — DIAZEPAM 5 MG PO TABS: 5.0000 mg | ORAL_TABLET | Freq: Three times a day (TID) | ORAL | Status: DC | PRN

## 2012-06-09 NOTE — Telephone Encounter (Signed)
Px written for call in   

## 2012-06-09 NOTE — Telephone Encounter (Signed)
Ok to refill 

## 2012-06-10 NOTE — Telephone Encounter (Signed)
Medication phoned to pharmacy.  

## 2012-06-30 ENCOUNTER — Other Ambulatory Visit: Payer: Self-pay | Admitting: *Deleted

## 2012-06-30 MED ORDER — HYDROCODONE-ACETAMINOPHEN 10-650 MG PO TABS: 1.0000 | ORAL_TABLET | Freq: Two times a day (BID) | ORAL | Status: DC | PRN

## 2012-06-30 NOTE — Telephone Encounter (Signed)
Px written for call in   

## 2012-06-30 NOTE — Telephone Encounter (Signed)
Rx called to pharmacy as instructed. 

## 2012-08-01 ENCOUNTER — Telehealth: Payer: Self-pay

## 2012-08-01 MED ORDER — FUROSEMIDE 40 MG PO TABS: 40.0000 mg | ORAL_TABLET | Freq: Two times a day (BID) | ORAL | Status: DC | PRN

## 2012-08-01 NOTE — Telephone Encounter (Signed)
Pt has refills on Furosemide but when gets warmer weather pt has to take furosemide 40 mg one tab twice a day due to leg swelling. Pt request new rx Furosemide with twice a day instructions sent to [facility].Please advise.

## 2012-08-01 NOTE — Telephone Encounter (Signed)
Will refill electronically  With bid dosing her K may go down - so need to be careful about that  F/u with me in 1-3 weeks to check on swelling and check her potassium level thankd

## 2012-08-01 NOTE — Telephone Encounter (Signed)
Patient notified

## 2012-08-19 ENCOUNTER — Encounter: Payer: Self-pay | Admitting: Family Medicine

## 2012-08-19 ENCOUNTER — Ambulatory Visit (INDEPENDENT_AMBULATORY_CARE_PROVIDER_SITE_OTHER): Payer: Medicare Other | Admitting: Family Medicine

## 2012-08-19 VITALS — BP 122/62 | HR 80 | Temp 98.1°F | Ht 67.5 in | Wt 147.2 lb

## 2012-08-19 DIAGNOSIS — Z23 Encounter for immunization: Secondary | ICD-10-CM

## 2012-08-19 DIAGNOSIS — R600 Localized edema: Secondary | ICD-10-CM

## 2012-08-19 DIAGNOSIS — Z1231 Encounter for screening mammogram for malignant neoplasm of breast: Secondary | ICD-10-CM | POA: Insufficient documentation

## 2012-08-19 DIAGNOSIS — R609 Edema, unspecified: Secondary | ICD-10-CM

## 2012-08-19 LAB — BASIC METABOLIC PANEL
BUN: 18 mg/dL (ref 6–23)
Chloride: 99 mEq/L (ref 96–112)
GFR: 62.04 mL/min (ref >60.00)
Potassium: 4.3 mEq/L (ref 3.5–5.1)
Sodium: 145 mEq/L (ref 135–145)

## 2012-08-19 NOTE — Progress Notes (Signed)
Subjective:    Patient ID: Carla Kelly, female    DOB: 25-May-1933, 78 y.o.   MRN: [id number]  HPI Here for f/u of chronic health problems  Is working now just 2 days , and prn depending on her family's schedule  Feeling ok overall   Last visit dies urinary issues Started estrace cream and ref to urology Estrace cream does help  Sees Dr Patsi Sears put her on a new med - new one myrbetriq 52- glad this is helping  Has a new great grand baby- excited   Wt is down 5 lb and bmi is 22  Flu shot- will get that today   mammo due in nov-needs [facility] No lumps on self exam  Edema- still there- always worse on R leg (has worse veins in that time)  Lasix- is taking 2 a day off and on - it does help quite a bit on the days she needs it No tingling or numbness    Hypothyroid Lab Results  Component Value Date   TSH 1.15 09/17/2011     Patient Active Problem List  Diagnosis  . HYPOTHYROIDISM  . HYPOGLYCEMIA  . B12 DEFICIENCY  . UNSPECIFIED VITAMIN D DEFICIENCY  . HYPERLIPIDEMIA  . EMBOLISM  . ASTHMA  . IRRITABLE BOWEL SYNDROME  . VAGINITIS, ATROPHIC  . Osteoarth NOS-Unspec  . FIBROMYALGIA  . OSTEOPENIA  . FATIGUE  . URINARY INCONTINENCE  . Personal History of Colonic Polyps  . HERPES ZOSTER, HX OF  . Right shoulder pain  . Pedal edema   Past Medical History  Diagnosis Date  . Asthma   . Hypothyroidism   . Osteoarthritis   . IBS (irritable bowel syndrome)   . Fibromyalgia   . History of herpes zoster   . HLD (hyperlipidemia)   . Adenomatous colon polyp   . Other B-complex deficiencies   . Osteoarthrosis, unspecified whether generalized or localized, unspecified site   . Embolism and thrombosis of unspecified site   . Disorder of bone and cartilage, unspecified   . Unspecified vitamin D deficiency   . Unspecified urinary incontinence    Past Surgical History  Procedure Date  . Abdominal hysterectomy 1970    fibroids;  endometriosis  . Cervical disc surgery     x 2  . Colonoscopy 2/02    polyps, BE-polyps  . US transvaginal pelvic modified 10/02    small left ovarian cyst  . Cholecystectomy 9/04  . Bladder tack 5/05   History  Substance Use Topics  . Smoking status: Never Smoker   . Smokeless tobacco: Never Used  . Alcohol Use: No     "is allergic"   Family History  Problem Relation Age of Onset  . Heart attack Mother   . Stroke Mother     several  . Lupus Brother   . Other Sister     benign brain tumor  . Asthma Daughter   . Other Brother     heart problems  . Colon cancer      1st cousin   Allergies  Allergen Reactions  . Ergocalciferol     REACTION: GI side eff  . Morphine     REACTION: ? reaction  . Nortriptyline Hcl     REACTION: ? reaction  . Tolterodine Tartrate     REACTION: ? reaction   Current Outpatient Prescriptions on File Prior to Visit  Medication Sig Dispense Refill  . albuterol (PROVENTIL HFA;VENTOLIN HFA) 108 (90 BASE)  MCG/ACT inhaler Inhale 2 puffs into the lungs every 4 (four) hours as needed.        Marland Kitchen albuterol (PROVENTIL) 4 MG tablet Take 1 tablet (4 mg total) by mouth every 4 (four) hours as needed.  120 tablet  5  . aspirin 325 MG tablet Take 325 mg by mouth daily as needed.        . Calcium 1500 MG tablet Take 1,500 mg by mouth daily.        . cetirizine (ZYRTEC) 10 MG tablet Take 10 mg by mouth as needed.      . diazepam (VALIUM) 5 MG tablet Take 1 tablet (5 mg total) by mouth 3 (three) times daily as needed.  90 tablet  1  . diclofenac (FLECTOR) 1.3 % PTCH Place 1 patch onto the skin as needed.        Marland Kitchen estradiol (ESTRACE VAGINAL) 0.1 MG/GM vaginal cream Use dime sized amount on urethral area once daily  42.5 g  3  . furosemide (LASIX) 40 MG tablet Take 1 tablet (40 mg total) by mouth 2 (two) times daily as needed. For leg swelling  60 tablet  5  . HYDROcodone-acetaminophen (LORCET) 10-650 MG per tablet Take 1 tablet by mouth 2 (two) times daily as  needed.  60 tablet  3  . levothyroxine (SYNTHROID, LEVOTHROID) 88 MCG tablet Take 1 tablet (88 mcg total) by mouth daily.  30 tablet  11  . Multiple Vitamin (MULTIVITAMIN) tablet Take 1 tablet by mouth daily.        Marland Kitchen MYRBETRIQ 50 MG TB24 Take 1 capsule by mouth daily.         Review of Systems Review of Systems  Constitutional: Negative for fever, appetite change, fatigue and unexpected weight change.  Eyes: Negative for pain and visual disturbance.  Respiratory: Negative for cough and shortness of breath.   Cardiovascular: Negative for cp or palpitations   pos for pedal edema and varicose veins  Gastrointestinal: Negative for nausea, diarrhea and constipation.  Genitourinary: Negative for urgency and frequency.  Skin: Negative for pallor or rash   Neurological: Negative for weakness, light-headedness, numbness and headaches.  Hematological: Negative for adenopathy. Does not bruise/bleed easily.  Psychiatric/Behavioral: Negative for dysphoric mood. The patient is not nervous/anxious.         Objective:   Physical Exam  Constitutional: She appears well-developed and well-nourished. No distress.  HENT:  Head: Normocephalic and atraumatic.  Eyes: Conjunctivae normal and EOM are normal. Pupils are equal, round, and reactive to light. No scleral icterus.  Neck: Normal range of motion. Neck supple. No JVD present.  Cardiovascular: Normal rate and regular rhythm.  Exam reveals no gallop.   Pulmonary/Chest: Effort normal and breath sounds normal. No respiratory distress. She has no wheezes.       No crackles  Abdominal: Soft. Bowel sounds are normal. She exhibits no distension. There is no tenderness.  Musculoskeletal: She exhibits edema. She exhibits no tenderness.       Mild - trace edema in ankles Compressible varicosities noted  No tenderness or palp cords  Lymphadenopathy:    She has no cervical adenopathy.  Neurological: She is alert. She has normal reflexes.  Skin: Skin is warm  and dry. No rash noted. No erythema. No pallor.  Psychiatric: She has a normal mood and affect.          Assessment & Plan:

## 2012-08-19 NOTE — Assessment & Plan Note (Signed)
Doing better with prn lasix Lab today for basic panel  Recommend supp hose when able

## 2012-08-19 NOTE — Patient Instructions (Addendum)
Flu shot today  Will also check your electrolytes to make sure the lasix does not cause any problems  Stop up front to get your mammogram referral on the way out

## 2012-08-19 NOTE — Assessment & Plan Note (Signed)
Scheduled that today Will do breast exam at next visit

## 2012-08-20 ENCOUNTER — Encounter: Payer: Self-pay | Admitting: *Deleted

## 2012-10-01 ENCOUNTER — Ambulatory Visit: Payer: Medicare Other

## 2012-10-06 ENCOUNTER — Other Ambulatory Visit: Payer: Self-pay | Admitting: Family Medicine

## 2012-10-07 NOTE — Telephone Encounter (Signed)
Px written for call in   

## 2012-10-07 NOTE — Telephone Encounter (Signed)
Ok to refill 

## 2012-10-07 NOTE — Telephone Encounter (Signed)
Rx called in as prescribed 

## 2012-10-08 ENCOUNTER — Other Ambulatory Visit: Payer: Self-pay | Admitting: *Deleted

## 2012-10-08 MED ORDER — LEVOTHYROXINE SODIUM 88 MCG PO TABS: 88.0000 ug | ORAL_TABLET | Freq: Every day | ORAL | Status: DC

## 2012-10-08 NOTE — Telephone Encounter (Signed)
Per the refill protocol, I can't refill this med because pt has not had a tsh with the past year. Is it ok with you to refill med or does she need a f/u.

## 2012-10-08 NOTE — Telephone Encounter (Signed)
I will see her in jan- please refil until that appt, will get labs for that, thanks

## 2012-10-16 ENCOUNTER — Ambulatory Visit: Payer: Self-pay | Admitting: Ophthalmology

## 2012-10-26 HISTORY — PX: CATARACT EXTRACTION: SUR2

## 2012-10-28 ENCOUNTER — Ambulatory Visit: Payer: Self-pay | Admitting: Ophthalmology

## 2012-11-12 ENCOUNTER — Other Ambulatory Visit: Payer: Self-pay

## 2012-11-12 MED ORDER — HYDROCODONE-ACETAMINOPHEN 10-650 MG PO TABS: 1.0000 | ORAL_TABLET | Freq: Two times a day (BID) | ORAL | Status: DC | PRN

## 2012-11-12 NOTE — Telephone Encounter (Signed)
Pt left v/m request refill hydrocodone-apap to [facility]. Pt has enough med to last until 11/24/12 but pt does not want to run out due to holiday.Please advise. Pt request call back.

## 2012-11-12 NOTE — Telephone Encounter (Signed)
Rx called in as prescribed Left voicemail letting pt know Rx was called into pharm.

## 2012-11-12 NOTE — Telephone Encounter (Signed)
Px written for call in   

## 2012-12-01 ENCOUNTER — Ambulatory Visit
Admission: RE | Admit: 2012-12-01 | Discharge: 2012-12-01 | Disposition: A | Discharge status: Home / Self Care | Payer: Medicare Other | Source: Ambulatory Visit | Attending: Family Medicine | Admitting: Family Medicine

## 2012-12-01 DIAGNOSIS — Z1231 Encounter for screening mammogram for malignant neoplasm of breast: Secondary | ICD-10-CM

## 2012-12-02 ENCOUNTER — Encounter: Payer: Self-pay | Admitting: *Deleted

## 2012-12-03 ENCOUNTER — Telehealth: Payer: Self-pay | Admitting: Family Medicine

## 2012-12-03 DIAGNOSIS — R600 Localized edema: Secondary | ICD-10-CM

## 2012-12-03 DIAGNOSIS — E538 Deficiency of other specified B group vitamins: Secondary | ICD-10-CM

## 2012-12-03 DIAGNOSIS — E039 Hypothyroidism, unspecified: Secondary | ICD-10-CM

## 2012-12-03 DIAGNOSIS — E559 Vitamin D deficiency, unspecified: Secondary | ICD-10-CM

## 2012-12-03 DIAGNOSIS — R5381 Other malaise: Secondary | ICD-10-CM

## 2012-12-03 DIAGNOSIS — M899 Disorder of bone, unspecified: Secondary | ICD-10-CM

## 2012-12-03 DIAGNOSIS — M949 Disorder of cartilage, unspecified: Secondary | ICD-10-CM

## 2012-12-03 DIAGNOSIS — E785 Hyperlipidemia, unspecified: Secondary | ICD-10-CM

## 2012-12-03 NOTE — Telephone Encounter (Signed)
Message copied by Judy Pimple on Wed Dec 03, 2012  5:49 PM ------      Message from: Baldomero Lamy      Created: Tue Dec 02, 2012  1:09 PM      Regarding: Cpx labs Thurs 1/9       Please order  future cpx labs for pt's upcoming lab appt.      Thanks      Rodney Booze

## 2012-12-04 ENCOUNTER — Other Ambulatory Visit (INDEPENDENT_AMBULATORY_CARE_PROVIDER_SITE_OTHER): Payer: Medicare Other

## 2012-12-04 DIAGNOSIS — M899 Disorder of bone, unspecified: Secondary | ICD-10-CM

## 2012-12-04 DIAGNOSIS — R5381 Other malaise: Secondary | ICD-10-CM

## 2012-12-04 DIAGNOSIS — R5383 Other fatigue: Secondary | ICD-10-CM

## 2012-12-04 DIAGNOSIS — M949 Disorder of cartilage, unspecified: Secondary | ICD-10-CM

## 2012-12-04 DIAGNOSIS — E785 Hyperlipidemia, unspecified: Secondary | ICD-10-CM

## 2012-12-04 DIAGNOSIS — R609 Edema, unspecified: Secondary | ICD-10-CM

## 2012-12-04 DIAGNOSIS — E039 Hypothyroidism, unspecified: Secondary | ICD-10-CM

## 2012-12-04 DIAGNOSIS — R600 Localized edema: Secondary | ICD-10-CM

## 2012-12-04 DIAGNOSIS — E559 Vitamin D deficiency, unspecified: Secondary | ICD-10-CM

## 2012-12-04 DIAGNOSIS — E538 Deficiency of other specified B group vitamins: Secondary | ICD-10-CM

## 2012-12-04 LAB — VITAMIN B12: Vitamin B-12: 345 pg/mL (ref 211–911)

## 2012-12-04 LAB — COMPREHENSIVE METABOLIC PANEL
ALT: 19 U/L (ref 0–35)
AST: 26 U/L (ref 0–37)
Alkaline Phosphatase: 58 U/L (ref 39–117)
BUN: 16 mg/dL (ref 6–23)
Creatinine, Ser: 0.9 mg/dL (ref 0.4–1.2)
Total Bilirubin: 0.7 mg/dL (ref 0.3–1.2)

## 2012-12-04 LAB — CBC WITH DIFFERENTIAL/PLATELET
Basophils Absolute: 0 10*3/uL (ref 0.0–0.1)
Basophils Relative: 0.7 % (ref 0.0–3.0)
Eosinophils Absolute: 0.4 10*3/uL (ref 0.0–0.7)
Hemoglobin: 12.7 g/dL (ref 12.0–15.0)
Lymphocytes Relative: 52.1 % — ABNORMAL HIGH (ref 12.0–46.0)
MCHC: 33.3 g/dL (ref 30.0–36.0)
MCV: 93.9 fl (ref 78.0–100.0)
Monocytes Absolute: 0.4 10*3/uL (ref 0.1–1.0)
Neutro Abs: 1.9 10*3/uL (ref 1.4–7.7)
RBC: 4.07 Mil/uL (ref 3.87–5.11)
RDW: 12.7 % (ref 11.5–14.6)

## 2012-12-04 LAB — LIPID PANEL
HDL: 64.6 mg/dL (ref >39.00)
Total CHOL/HDL Ratio: 3
Triglycerides: 85 mg/dL (ref 0.0–149.0)
VLDL: 17 mg/dL (ref 0.0–40.0)

## 2012-12-04 LAB — TSH: TSH: 2.17 u[IU]/mL (ref 0.35–5.50)

## 2012-12-12 ENCOUNTER — Ambulatory Visit (INDEPENDENT_AMBULATORY_CARE_PROVIDER_SITE_OTHER): Payer: Medicare Other | Admitting: Family Medicine

## 2012-12-12 ENCOUNTER — Encounter: Payer: Self-pay | Admitting: Family Medicine

## 2012-12-12 VITALS — BP 122/62 | HR 86 | Temp 98.3°F | Ht 66.5 in | Wt 150.2 lb

## 2012-12-12 DIAGNOSIS — E785 Hyperlipidemia, unspecified: Secondary | ICD-10-CM

## 2012-12-12 DIAGNOSIS — Z23 Encounter for immunization: Secondary | ICD-10-CM

## 2012-12-12 DIAGNOSIS — E039 Hypothyroidism, unspecified: Secondary | ICD-10-CM

## 2012-12-12 DIAGNOSIS — E538 Deficiency of other specified B group vitamins: Secondary | ICD-10-CM

## 2012-12-12 DIAGNOSIS — E559 Vitamin D deficiency, unspecified: Secondary | ICD-10-CM

## 2012-12-12 DIAGNOSIS — M899 Disorder of bone, unspecified: Secondary | ICD-10-CM

## 2012-12-12 DIAGNOSIS — M949 Disorder of cartilage, unspecified: Secondary | ICD-10-CM

## 2012-12-12 MED ORDER — LEVOTHYROXINE SODIUM 88 MCG PO TABS: 88.0000 ug | ORAL_TABLET | Freq: Every day | ORAL | Status: DC

## 2012-12-12 NOTE — Assessment & Plan Note (Signed)
Hypothyroidism  Pt has no clinical changes No change in energy level/ hair or skin/ edema and no tremor Lab Results  Component Value Date   TSH 2.17 12/04/2012    Refilled med- DAW synthroid

## 2012-12-12 NOTE — Assessment & Plan Note (Signed)
D level 40 at current suppl - disc imp to bone and overall health

## 2012-12-12 NOTE — Assessment & Plan Note (Signed)
On current supplement - level is stable  Oral suppl

## 2012-12-12 NOTE — Progress Notes (Signed)
Subjective:    Patient ID: Carla Kelly, female    DOB: 01-12-1933, 79 y.o.   MRN: [id number]  HPI Here for check up of chronic medical conditions and to review health mt list   Doing pretty well overall Urinary incontinence still bothers her - sees Dr Patsi Sears and was on med - that she thought raised her bp - was not helping much  Estrace cream helps more than anything   Wt is up 3 lb with bmi of 23 Is eating a healthy diet and stays very active  She really watches her salt intake   Zoster status-not had the vaccine and is not ready for it   Td was 4/04 - wants to get that today    mammo 1/14- she has very dense breasts  Self exam-no lumps or changes  Had hysterectomy-no gyn symptoms at all   colonosc 7/11- 10 year recall  dexa 12/12- osteopenia  Ca and D D level is ok at 40 No falls or fractures in the past year     Hypothyroidism  Pt has no clinical changes No change in energy level/ hair or skin/ edema and no tremor Lab Results  Component Value Date   TSH 2.17 12/04/2012     Hyperlipidemia  Lab Results  Component Value Date   CHOL 202* 12/04/2012   CHOL 212* 09/17/2011   CHOL 193 09/13/2010   Lab Results  Component Value Date   HDL 64.60 12/04/2012   HDL 73.60 09/17/2011   HDL 63.40 09/13/2010   Lab Results  Component Value Date   LDLCALC 120* 09/13/2010   LDLCALC 106* 08/29/2009   Lab Results  Component Value Date   TRIG 85.0 12/04/2012   TRIG 69.0 09/17/2011   TRIG 50.0 09/13/2010   Lab Results  Component Value Date   CHOLHDL 3 12/04/2012   CHOLHDL 3 09/17/2011   CHOLHDL 3 09/13/2010   Lab Results  Component Value Date   LDLDIRECT 111.0 12/04/2012   LDLDIRECT 128.3 09/17/2011   LDLDIRECT 161.4 08/25/2008   pretty good overall  Eats healthy nuts and a very low fat diet   B 12 def- level is ok today Lab Results  Component Value Date   VITAMINB12 345 12/04/2012     Had cataract surgery in dec -went well   Nl mood   Patient Active Problem  List  Diagnosis  . HYPOTHYROIDISM  . HYPOGLYCEMIA  . B12 DEFICIENCY  . UNSPECIFIED VITAMIN D DEFICIENCY  . HYPERLIPIDEMIA  . EMBOLISM  . ASTHMA  . IRRITABLE BOWEL SYNDROME  . VAGINITIS, ATROPHIC  . Osteoarth NOS-Unspec  . FIBROMYALGIA  . OSTEOPENIA  . FATIGUE  . URINARY INCONTINENCE  . Personal History of Colonic Polyps  . HERPES ZOSTER, HX OF  . Right shoulder pain  . Pedal edema  . Other screening mammogram   Past Medical History  Diagnosis Date  . Asthma   . Hypothyroidism   . Osteoarthritis   . IBS (irritable bowel syndrome)   . Fibromyalgia   . History of herpes zoster   . HLD (hyperlipidemia)   . Adenomatous colon polyp   . Other B-complex deficiencies   . Osteoarthrosis, unspecified whether generalized or localized, unspecified site   . Embolism and thrombosis of unspecified site   . Disorder of bone and cartilage, unspecified   . Unspecified vitamin D deficiency   . Unspecified urinary incontinence    Past Surgical History  Procedure Date  . Abdominal hysterectomy 1970    fibroids;  endometriosis  . Cervical disc surgery     x 2  . Colonoscopy 2/02    polyps, BE-polyps  . US transvaginal pelvic modified 10/02    small left ovarian cyst  . Cholecystectomy 9/04  . Bladder tack 5/05   History  Substance Use Topics  . Smoking status: Never Smoker   . Smokeless tobacco: Never Used  . Alcohol Use: No     Comment: "is allergic"   Family History  Problem Relation Age of Onset  . Heart attack Mother   . Stroke Mother     several  . Lupus Brother   . Other Sister     benign brain tumor  . Asthma Daughter   . Other Brother     heart problems  . Colon cancer      1st cousin   Allergies  Allergen Reactions  . Ergocalciferol     REACTION: GI side eff  . Morphine     REACTION: ? reaction  . Nortriptyline Hcl     REACTION: ? reaction  . Tolterodine Tartrate     REACTION: ? reaction   Current Outpatient Prescriptions on File Prior to Visit    Medication Sig Dispense Refill  . albuterol (PROVENTIL HFA;VENTOLIN HFA) 108 (90 BASE) MCG/ACT inhaler Inhale 2 puffs into the lungs every 4 (four) hours as needed.        Marland Kitchen albuterol (PROVENTIL) 4 MG tablet Take 1 tablet (4 mg total) by mouth every 4 (four) hours as needed.  120 tablet  5  . aspirin 325 MG tablet Take 325 mg by mouth daily as needed.        . Calcium 1500 MG tablet Take 1,500 mg by mouth daily.        . cetirizine (ZYRTEC) 10 MG tablet Take 10 mg by mouth as needed.      . diazepam (VALIUM) 5 MG tablet TAKE 1 TABLET BY MOUTH 3 TIMES A DAY AS NEEDED  90 tablet  1  . diclofenac (FLECTOR) 1.3 % PTCH Place 1 patch onto the skin as needed.        Marland Kitchen estradiol (ESTRACE VAGINAL) 0.1 MG/GM vaginal cream Use dime sized amount on urethral area once daily  42.5 g  3  . furosemide (LASIX) 40 MG tablet Take 1 tablet (40 mg total) by mouth 2 (two) times daily as needed. For leg swelling  60 tablet  5  . HYDROcodone-acetaminophen (LORCET) 10-650 MG per tablet Take 1 tablet by mouth 2 (two) times daily as needed.  60 tablet  3  . levothyroxine (SYNTHROID, LEVOTHROID) 88 MCG tablet Take 1 tablet (88 mcg total) by mouth daily.  30 tablet  2  . Multiple Vitamin (MULTIVITAMIN) tablet Take 1 tablet by mouth daily.            Review of Systems Review of Systems  Constitutional: Negative for fever, appetite change, fatigue and unexpected weight change.  Eyes: Negative for pain and visual disturbance.  Respiratory: Negative for cough and shortness of breath.   Cardiovascular: Negative for cp or palpitations    Gastrointestinal: Negative for nausea, diarrhea and constipation.  Genitourinary: Negative for urgency and frequency.  Skin: Negative for pallor or rash   Neurological: Negative for weakness, light-headedness, numbness and headaches.  Hematological: Negative for adenopathy. Does not bruise/bleed easily.  Psychiatric/Behavioral: Negative for dysphoric mood. The patient is not  nervous/anxious.         Objective:   Physical Exam  Constitutional: She appears  well-developed and well-nourished. No distress.  HENT:  Head: Normocephalic and atraumatic.  Right Ear: External ear normal.  Left Ear: External ear normal.  Nose: Nose normal.  Mouth/Throat: Oropharynx is clear and moist. No oropharyngeal exudate.  Eyes: Conjunctivae normal and EOM are normal. Pupils are equal, round, and reactive to light. Right eye exhibits no discharge. Left eye exhibits no discharge. No scleral icterus.  Neck: Normal range of motion. Neck supple. No JVD present. Carotid bruit is not present. No thyromegaly present.  Cardiovascular: Normal rate, regular rhythm, normal heart sounds and intact distal pulses.  Exam reveals no gallop.   Pulmonary/Chest: Effort normal and breath sounds normal. No respiratory distress. She has no wheezes. She exhibits no tenderness.  Abdominal: Soft. Bowel sounds are normal. She exhibits no distension, no abdominal bruit and no mass. There is no tenderness.  Genitourinary: No breast swelling, tenderness, discharge or bleeding.       Breast exam: No mass, nodules, thickening, tenderness, bulging, retraction, inflamation, nipple discharge or skin changes noted.  No axillary or clavicular LA.  Chaperoned exam.    Musculoskeletal: She exhibits no edema and no tenderness.  Lymphadenopathy:    She has no cervical adenopathy.  Neurological: She is alert. She has normal reflexes. No cranial nerve deficit. She exhibits normal muscle tone. Coordination normal.  Skin: Skin is warm and dry. No rash noted. No erythema. No pallor.  Psychiatric: She has a normal mood and affect.          Assessment & Plan:

## 2012-12-12 NOTE — Assessment & Plan Note (Signed)
Disc goals for lipids and reasons to control them Rev labs with pt Rev low sat fat diet in detail  Stable to slt improved

## 2012-12-12 NOTE — Patient Instructions (Addendum)
Keep taking good care of yourself  Tetanus shot today  Keep taking calcium and vitamin D

## 2012-12-12 NOTE — Assessment & Plan Note (Signed)
Rev most recent dexa - from last year - will continue to obs No falls or new fx  On ca and D D level ok  Enc continued exercise

## 2013-01-09 ENCOUNTER — Other Ambulatory Visit: Payer: Self-pay | Admitting: Family Medicine

## 2013-01-13 ENCOUNTER — Telehealth: Payer: Self-pay

## 2013-01-13 NOTE — Telephone Encounter (Signed)
Left voicemail requesting pt to call office 

## 2013-01-13 NOTE — Telephone Encounter (Signed)
Thanks - let me know exactly how she is taking it - ie: 1/2 pill how often so I can arrange dosing of an alt med, thanks

## 2013-01-13 NOTE — Telephone Encounter (Signed)
Pt takes 1/2 pill 3-4 times daily, takes 1 whole pill if pain is unbearable but not often.

## 2013-01-13 NOTE — Telephone Encounter (Signed)
I changed it to hydrocodone apap 10-325 with inst to take 1/2 pill up to every 6 hours prn pain with 60 pills no refil to call in  Tell her it is just the tylenol component that has changed thanks

## 2013-01-13 NOTE — Telephone Encounter (Signed)
Pt left v/m pt received letter and [facility] said hydrocodone apap 10/650 is no longer available. Pt still has 2 weeks of med left. Pt said she breaks the 10/650 in half when she takes med. Pt wants to let Dr Milinda Antis know so she will have time to order substitute med to [facility].Please advise. Pt request call back.

## 2013-01-14 MED ORDER — HYDROCODONE-ACETAMINOPHEN 10-325 MG PO TABS: 0.5000 | ORAL_TABLET | Freq: Four times a day (QID) | ORAL | Status: DC | PRN

## 2013-01-14 NOTE — Telephone Encounter (Signed)
Rx called in as prescribed an pt notified

## 2013-01-29 ENCOUNTER — Other Ambulatory Visit: Payer: Self-pay | Admitting: Family Medicine

## 2013-01-31 NOTE — Telephone Encounter (Signed)
OK to refill

## 2013-02-01 NOTE — Telephone Encounter (Signed)
Px written for call in   

## 2013-02-02 NOTE — Telephone Encounter (Signed)
Rx called in as prescribed 

## 2013-02-16 ENCOUNTER — Ambulatory Visit (INDEPENDENT_AMBULATORY_CARE_PROVIDER_SITE_OTHER): Payer: Medicare Other | Admitting: Family Medicine

## 2013-02-16 ENCOUNTER — Encounter: Payer: Self-pay | Admitting: Family Medicine

## 2013-02-16 VITALS — BP 116/72 | HR 75 | Temp 98.1°F | Ht 66.5 in | Wt 153.0 lb

## 2013-02-16 DIAGNOSIS — L408 Other psoriasis: Secondary | ICD-10-CM

## 2013-02-16 DIAGNOSIS — L409 Psoriasis, unspecified: Secondary | ICD-10-CM | POA: Insufficient documentation

## 2013-02-16 MED ORDER — FLUOCINOLONE ACETONIDE 0.01 % EX OIL: TOPICAL_OIL | Freq: Three times a day (TID) | CUTANEOUS | Status: DC

## 2013-02-16 NOTE — Progress Notes (Signed)
Subjective:    Patient ID: Carla Kelly, female    DOB: [birth year], 79 y.o.   MRN: [id number]  HPI Here for her psoriasis  Mother and daughter had it  She herself had it when she was younger- had not had to deal with it in a while  When she was in her 27s-30s she was on methotrexate and it helped   Her fingernails pit also- that is normal for her   Itchy spot in her R side of lower scalp- started in the past several weeks  She put some cortisone cream on it   Is interested in derma smoothe- her daughter uses it   Patient Active Problem List  Diagnosis  . HYPOTHYROIDISM  . HYPOGLYCEMIA  . B12 DEFICIENCY  . UNSPECIFIED VITAMIN D DEFICIENCY  . HYPERLIPIDEMIA  . EMBOLISM  . ASTHMA  . IRRITABLE BOWEL SYNDROME  . VAGINITIS, ATROPHIC  . Osteoarth NOS-Unspec  . FIBROMYALGIA  . OSTEOPENIA  . URINARY INCONTINENCE  . Personal History of Colonic Polyps  . HERPES ZOSTER, HX OF  . Pedal edema  . Other screening mammogram   Past Medical History  Diagnosis Date  . Asthma   . Hypothyroidism   . Osteoarthritis   . IBS (irritable bowel syndrome)   . Fibromyalgia   . History of herpes zoster   . HLD (hyperlipidemia)   . Adenomatous colon polyp   . Other B-complex deficiencies   . Osteoarthrosis, unspecified whether generalized or localized, unspecified site   . Embolism and thrombosis of unspecified site   . Disorder of bone and cartilage, unspecified   . Unspecified vitamin D deficiency   . Unspecified urinary incontinence    Past Surgical History  Procedure Laterality Date  . Abdominal hysterectomy  1970    fibroids; endometriosis  . Cervical disc surgery      x 2  . Colonoscopy  2/02    polyps, BE-polyps  . US transvaginal pelvic modified  10/02    small left ovarian cyst  . Cholecystectomy  9/04  . Bladder tack  5/05  . Cataract extraction  12/13    Dr Druscilla Brownie   History  Substance Use Topics  . Smoking status: Never Smoker   . Smokeless tobacco: Never Used  .  Alcohol Use: No     Comment: "is allergic"   Family History  Problem Relation Age of Onset  . Heart attack Mother   . Stroke Mother     several  . Lupus Brother   . Other Sister     benign brain tumor  . Asthma Daughter   . Other Brother     heart problems  . Colon cancer      1st cousin   Allergies  Allergen Reactions  . Ergocalciferol     REACTION: GI side eff  . Morphine     REACTION: ? reaction  . Nortriptyline Hcl     REACTION: ? reaction  . Tolterodine Tartrate     REACTION: ? reaction   Current Outpatient Prescriptions on File Prior to Visit  Medication Sig Dispense Refill  . albuterol (PROVENTIL HFA;VENTOLIN HFA) 108 (90 BASE) MCG/ACT inhaler Inhale 2 puffs into the lungs every 4 (four) hours as needed.        Marland Kitchen albuterol (PROVENTIL) 4 MG tablet Take 1 tablet (4 mg total) by mouth every 4 (four) hours as needed.  120 tablet  5  . aspirin 325 MG tablet Take 325 mg by mouth daily as needed.        Marland Kitchen  Calcium 1500 MG tablet Take 1,500 mg by mouth daily.        . cetirizine (ZYRTEC) 10 MG tablet Take 10 mg by mouth as needed.      . diazepam (VALIUM) 5 MG tablet TAKE 1 TAB THREE TIMES DAILY AS NEEDED  90 tablet  1  . diclofenac (FLECTOR) 1.3 % PTCH Place 1 patch onto the skin as needed.        Marland Kitchen estradiol (ESTRACE VAGINAL) 0.1 MG/GM vaginal cream Use dime sized amount on urethral area once daily  42.5 g  3  . furosemide (LASIX) 40 MG tablet Take 1 tablet (40 mg total) by mouth 2 (two) times daily as needed. For leg swelling  60 tablet  5  . HYDROcodone-acetaminophen (NORCO) 10-325 MG per tablet Take 0.5 tablets by mouth every 6 (six) hours as needed for pain.  60 tablet  0  . levothyroxine (SYNTHROID, LEVOTHROID) 88 MCG tablet Take 1 tablet (88 mcg total) by mouth daily.  30 tablet  11  . Multiple Vitamin (MULTIVITAMIN) tablet Take 1 tablet by mouth daily.        Marland Kitchen SYNTHROID 88 MCG tablet TAKE 1 TABLET (88 MCG TOTAL) BY MOUTH DAILY.  30 tablet  5   No current  facility-administered medications on file prior to visit.     Review of Systems Review of Systems  Constitutional: Negative for fever, appetite change, fatigue and unexpected weight change.  Eyes: Negative for pain and visual disturbance.  Respiratory: Negative for cough and shortness of breath.   Cardiovascular: Negative for cp or palpitations    Gastrointestinal: Negative for nausea, diarrhea and constipation.  Genitourinary: Negative for urgency and frequency.  Skin: Negative for pallor and pos for rash   Neurological: Negative for weakness, light-headedness, numbness and headaches.  Hematological: Negative for adenopathy. Does not bruise/bleed easily.  Psychiatric/Behavioral: Negative for dysphoric mood. The patient is not nervous/anxious.         Objective:   Physical Exam  Constitutional: She appears well-developed and well-nourished. No distress.  HENT:  Head: Normocephalic and atraumatic.  Mouth/Throat: Oropharynx is clear and moist.  Eyes: Conjunctivae and EOM are normal. Pupils are equal, round, and reactive to light. No scleral icterus.  Neck: Normal range of motion. Neck supple.  Cardiovascular: Normal rate and regular rhythm.   Musculoskeletal: She exhibits no edema and no tenderness.  Lymphadenopathy:    She has no cervical adenopathy.  Neurological: She is alert.  Skin: Skin is warm and dry. Rash noted. There is erythema.  Erythematous raised plaque on L posterior scalp 2-3 cm in diameter  No signs of infection   Psychiatric: She has a normal mood and affect.          Assessment & Plan:

## 2013-02-16 NOTE — Assessment & Plan Note (Signed)
Mild -confined to small area of L post scalp/no arthritis  Will try derma smoothe oil- either locally 1-3 times daily or under shower cap for 4 hours  Pt will update me  She has been on MTX in past for this - but doubt that will be needed unless this becomes much worse  Given handout and inst not to scratch

## 2013-02-16 NOTE — Patient Instructions (Addendum)
Use the derma smoothe 1-3 times a day to affected area of psoriasis - and if area gets large you can instead apply to wetted scalp and cover with shower cap for 4 hours  Keep me posted if not improved  Try not to scratch

## 2013-02-20 ENCOUNTER — Other Ambulatory Visit: Payer: Self-pay | Admitting: Family Medicine

## 2013-02-20 NOTE — Telephone Encounter (Signed)
Ok to refill 

## 2013-02-20 NOTE — Telephone Encounter (Signed)
Rx called in as prescribed 

## 2013-02-20 NOTE — Telephone Encounter (Signed)
Px written for call in   

## 2013-03-16 ENCOUNTER — Encounter: Payer: Self-pay | Admitting: Family Medicine

## 2013-03-16 ENCOUNTER — Ambulatory Visit (INDEPENDENT_AMBULATORY_CARE_PROVIDER_SITE_OTHER): Payer: Medicare Other | Admitting: Family Medicine

## 2013-03-16 DIAGNOSIS — L270 Generalized skin eruption due to drugs and medicaments taken internally: Secondary | ICD-10-CM

## 2013-03-16 DIAGNOSIS — I10 Essential (primary) hypertension: Secondary | ICD-10-CM

## 2013-03-16 MED ORDER — MOMETASONE FUROATE 0.1 % EX CREA: TOPICAL_CREAM | Freq: Every day | CUTANEOUS | Status: DC

## 2013-03-16 MED ORDER — TRAMADOL HCL 50 MG PO TABS: 50.0000 mg | ORAL_TABLET | Freq: Three times a day (TID) | ORAL | Status: DC | PRN

## 2013-03-16 NOTE — Progress Notes (Signed)
Subjective:    Patient ID: Carla Kelly, female    DOB: [birth year], 79 y.o.   MRN: [id number]  HPI Here with a bad rash  Started over the weekend  On back and side- both sides of her body (not shingles) Is intensely itchy   Did not get into poison ivy that she knows of  Did mow the yard Her pain med did change -norco 10-325  Patient Active Problem List  Diagnosis  . HYPOTHYROIDISM  . HYPOGLYCEMIA  . B12 DEFICIENCY  . UNSPECIFIED VITAMIN D DEFICIENCY  . HYPERLIPIDEMIA  . EMBOLISM  . ASTHMA  . IRRITABLE BOWEL SYNDROME  . VAGINITIS, ATROPHIC  . Osteoarth NOS-Unspec  . FIBROMYALGIA  . OSTEOPENIA  . URINARY INCONTINENCE  . Personal History of Colonic Polyps  . HERPES ZOSTER, HX OF  . Pedal edema  . Other screening mammogram  . Psoriasis   Past Medical History  Diagnosis Date  . Asthma   . Hypothyroidism   . Osteoarthritis   . IBS (irritable bowel syndrome)   . Fibromyalgia   . History of herpes zoster   . HLD (hyperlipidemia)   . Adenomatous colon polyp   . Other B-complex deficiencies   . Osteoarthrosis, unspecified whether generalized or localized, unspecified site   . Embolism and thrombosis of unspecified site   . Disorder of bone and cartilage, unspecified   . Unspecified vitamin D deficiency   . Unspecified urinary incontinence    Past Surgical History  Procedure Laterality Date  . Abdominal hysterectomy  1970    fibroids; endometriosis  . Cervical disc surgery      x 2  . Colonoscopy  2/02    polyps, BE-polyps  . US transvaginal pelvic modified  10/02    small left ovarian cyst  . Cholecystectomy  9/04  . Bladder tack  5/05  . Cataract extraction  12/13    Dr Druscilla Brownie   History  Substance Use Topics  . Smoking status: Never Smoker   . Smokeless tobacco: Never Used  . Alcohol Use: No     Comment: "is allergic"   Family History  Problem Relation Age of Onset  . Heart attack Mother   . Stroke Mother     several  . Lupus Brother   . Other  Sister     benign brain tumor  . Asthma Daughter   . Other Brother     heart problems  . Colon cancer      1st cousin   Allergies  Allergen Reactions  . Ergocalciferol     REACTION: GI side eff  . Morphine     REACTION: ? reaction  . Nortriptyline Hcl     REACTION: ? reaction  . Tolterodine Tartrate     REACTION: ? reaction   Current Outpatient Prescriptions on File Prior to Visit  Medication Sig Dispense Refill  . albuterol (PROVENTIL HFA;VENTOLIN HFA) 108 (90 BASE) MCG/ACT inhaler Inhale 2 puffs into the lungs every 4 (four) hours as needed.        Marland Kitchen albuterol (PROVENTIL) 4 MG tablet Take 1 tablet (4 mg total) by mouth every 4 (four) hours as needed.  120 tablet  5  . aspirin 325 MG tablet Take 325 mg by mouth daily as needed.        . Calcium 1500 MG tablet Take 1,500 mg by mouth daily.        . cetirizine (ZYRTEC) 10 MG tablet Take 10 mg by mouth as needed.      Marland Kitchen  diazepam (VALIUM) 5 MG tablet TAKE 1 TAB THREE TIMES DAILY AS NEEDED  90 tablet  1  . diclofenac (FLECTOR) 1.3 % PTCH Place 1 patch onto the skin as needed.        Marland Kitchen estradiol (ESTRACE VAGINAL) 0.1 MG/GM vaginal cream Use dime sized amount on urethral area once daily  42.5 g  3  . fluocinolone (DERMA-SMOOTHE/FS SCALP) 0.01 % external oil Apply topically 3 (three) times daily.  120 mL  1  . furosemide (LASIX) 40 MG tablet Take 1 tablet (40 mg total) by mouth 2 (two) times daily as needed. For leg swelling  60 tablet  5  . HYDROcodone-acetaminophen (NORCO) 10-325 MG per tablet TAKE ONE-HALF TABLET BY MOUTH EVERY 6 HOURS AS NEEDED  60 tablet  3  . levothyroxine (SYNTHROID, LEVOTHROID) 88 MCG tablet Take 1 tablet (88 mcg total) by mouth daily.  30 tablet  11  . Multiple Vitamin (MULTIVITAMIN) tablet Take 1 tablet by mouth daily.        Marland Kitchen SYNTHROID 88 MCG tablet TAKE 1 TABLET (88 MCG TOTAL) BY MOUTH DAILY.  30 tablet  5   No current facility-administered medications on file prior to visit.      Review of  Systems Review of Systems  Constitutional: Negative for fever, appetite change, fatigue and unexpected weight change.  Eyes: Negative for pain and visual disturbance.  Respiratory: Negative for cough and shortness of breath.   Cardiovascular: Negative for cp or palpitations    Gastrointestinal: Negative for nausea, diarrhea and constipation.  Genitourinary: Negative for urgency and frequency.  Skin: Negative for pallor and pos for itching/ rash neg for tick bite or other insect bite  MSK neg for joint swelling  Neurological: Negative for weakness, light-headedness, numbness and headaches.  Hematological: Negative for adenopathy. Does not bruise/bleed easily.  Psychiatric/Behavioral: Negative for dysphoric mood. The patient is not nervous/anxious.         Objective:   Physical Exam  Constitutional: She appears well-developed and well-nourished. No distress.  HENT:  Head: Normocephalic and atraumatic.  Mouth/Throat: Oropharynx is clear and moist.  Eyes: Conjunctivae and EOM are normal. Pupils are equal, round, and reactive to light. Right eye exhibits no discharge. Left eye exhibits no discharge. No scleral icterus.  Neck: Normal range of motion. Neck supple.  Cardiovascular: Normal rate and regular rhythm.   Pulmonary/Chest: Effort normal and breath sounds normal. She has no wheezes.  Musculoskeletal: She exhibits no edema.  Lymphadenopathy:    She has no cervical adenopathy.  Neurological: She is alert. She has normal reflexes.  Skin: Skin is warm and dry. Rash noted. There is erythema.  Patchy erythematous rash that is maculopapular over upper 1/2 of back and R side of neck Large area of whelp over R forearm   Few excoriations noted   Psychiatric: She has a normal mood and affect.          Assessment & Plan:

## 2013-03-16 NOTE — Patient Instructions (Addendum)
You may be allergic to norco   (but I do want you to look for poison ivy or oak in your yard)  Hold norco  and let's see if this gets better  Try tramadol for pain instead  - watch out for sedation or dizziness  Keep cool (hot conditions make itch worse) Take zyrtec 10 mg every day - antihistamine for itch - over the counter  If not improved in several days- call so we can start some prednisone  In the meantime- cortisone cream over the counter is fine If you develop any shortness of breath or wheezing - seek care immediately and let us know

## 2013-03-16 NOTE — Assessment & Plan Note (Signed)
Suspect norco is the cause - but pt is going to double check her yard for poison ivy or oak as well  Will hold norco Try tramadol instead for pain Elocon cream prn  Zyrtec Call if no improvement in several days

## 2013-03-18 ENCOUNTER — Telehealth: Payer: Self-pay

## 2013-03-18 MED ORDER — PREDNISONE 10 MG PO TABS: ORAL_TABLET | ORAL | Status: DC

## 2013-03-18 NOTE — Telephone Encounter (Signed)
Pt notified Rx sent to pharmacy and to let us know if no improvement so Dr. Milinda Antis can refer her to derm.

## 2013-03-18 NOTE — Telephone Encounter (Signed)
I sent prednisone to the pharmacy If no improvement will ref to derm

## 2013-03-18 NOTE — Telephone Encounter (Signed)
Pt seen 03/16/13 and rash is no better but has not spread anymore since seen; pt did stop Norco and has been using cortisone cream. No SOB or wheezing. Pt request prednisone called to [facility] and pt request call back when sent to pharmacy.Please advise.

## 2013-04-10 ENCOUNTER — Other Ambulatory Visit: Payer: Self-pay | Admitting: Family Medicine

## 2013-04-23 ENCOUNTER — Other Ambulatory Visit: Payer: Self-pay | Admitting: Family Medicine

## 2013-05-12 ENCOUNTER — Other Ambulatory Visit: Payer: Self-pay | Admitting: Family Medicine

## 2013-05-13 NOTE — Telephone Encounter (Signed)
Electronic refill request, please advise  

## 2013-05-13 NOTE — Telephone Encounter (Signed)
Px written for call in   

## 2013-05-13 NOTE — Telephone Encounter (Signed)
Rx called in as prescribed 

## 2013-05-26 ENCOUNTER — Other Ambulatory Visit: Payer: Self-pay | Admitting: Family Medicine

## 2013-05-26 NOTE — Telephone Encounter (Signed)
Electronic refill request, please advise  

## 2013-05-26 NOTE — Telephone Encounter (Signed)
Please give her 3 refills on each

## 2013-05-27 NOTE — Telephone Encounter (Signed)
done

## 2013-06-04 ENCOUNTER — Encounter: Payer: Self-pay | Admitting: *Deleted

## 2013-06-05 ENCOUNTER — Ambulatory Visit (INDEPENDENT_AMBULATORY_CARE_PROVIDER_SITE_OTHER): Payer: Self-pay | Admitting: Family Medicine

## 2013-06-05 ENCOUNTER — Encounter: Payer: Self-pay | Admitting: Family Medicine

## 2013-06-05 ENCOUNTER — Ambulatory Visit: Payer: Medicare Other | Admitting: Family Medicine

## 2013-06-05 VITALS — BP 110/60 | HR 102 | Temp 98.1°F | Ht 66.5 in | Wt 152.8 lb

## 2013-06-05 DIAGNOSIS — L408 Other psoriasis: Secondary | ICD-10-CM

## 2013-06-05 DIAGNOSIS — L409 Psoriasis, unspecified: Secondary | ICD-10-CM

## 2013-06-05 MED ORDER — MOMETASONE FUROATE 0.1 % EX CREA: TOPICAL_CREAM | Freq: Every day | CUTANEOUS | Status: DC

## 2013-06-05 NOTE — Assessment & Plan Note (Signed)
Improvement on limbs but stubborn on lower scalp Adv to continue dermasmoothe and try sulfate free shampoo Also elocon prn  Ref to derm for further eval and tx  Pt on mtx in the past-wishes to avoid that if possible

## 2013-06-05 NOTE — Patient Instructions (Addendum)
We will do a dermatologist referral when you check out  Continue current medicines

## 2013-06-05 NOTE — Progress Notes (Signed)
  Subjective:    Patient ID: Carla Kelly, female    DOB: 03/15/1933, 79 y.o.   MRN: [id number]  HPI Here for psoriasis  Thinks she may need derm ref and wants to see Dr Margo Aye  Some areas on body are imp- derma smoothe oil - and wrapped arms in saran One small area on her L lower leg and r shoulder  Area on scalp is worse - and is itchy , has atarax for that   Used to see Dr Orson Aloe   No particular triggers Was better for 4 years   Washes hair with pantene - is alcohol based and she will change to sulfate free shampoo   Takes tramadol 1-2 per day - is working ok for chronic pain  She has to update controlled tox today  occ hydrocodone- but not for a while    Review of Systems Review of Systems  Constitutional: Negative for fever, appetite change, fatigue and unexpected weight change.  Eyes: Negative for pain and visual disturbance.  Respiratory: Negative for cough and shortness of breath.   Cardiovascular: Negative for cp or palpitations    Gastrointestinal: Negative for nausea, diarrhea and constipation.  Genitourinary: Negative for urgency and frequency.  Skin: Negative for pallor and pos for rash that sometimes itches Neurological: Negative for weakness, light-headedness, numbness and headaches.  Hematological: Negative for adenopathy. Does not bruise/bleed easily.  Psychiatric/Behavioral: Negative for dysphoric mood. The patient is not nervous/anxious.         Objective:   Physical Exam  Constitutional: She appears well-developed and well-nourished. No distress.  HENT:  Head: Normocephalic and atraumatic.  Right Ear: External ear normal.  Left Ear: External ear normal.  Nose: Nose normal.  Mouth/Throat: Oropharynx is clear and moist.  Eyes: Conjunctivae and EOM are normal. Pupils are equal, round, and reactive to light. Right eye exhibits no discharge. Left eye exhibits no discharge. No scleral icterus.  Neck: Normal range of motion. Neck supple. Carotid bruit is  not present.  Cardiovascular: Normal rate and regular rhythm.   Musculoskeletal: She exhibits no edema.  Lymphadenopathy:    She has no cervical adenopathy.  Neurological: She is alert.  Skin: Skin is warm and dry. Rash noted. There is erythema. No pallor.  Very small patches of erythema on arms that are improved  Post scalp- some flaking plaques without excoriation as well as papules  Psychiatric: She has a normal mood and affect.          Assessment & Plan:

## 2013-06-17 ENCOUNTER — Encounter: Payer: Self-pay | Admitting: Family Medicine

## 2013-07-10 ENCOUNTER — Other Ambulatory Visit: Payer: Self-pay | Admitting: Family Medicine

## 2013-07-29 ENCOUNTER — Telehealth: Payer: Self-pay | Admitting: Family Medicine

## 2013-07-29 NOTE — Telephone Encounter (Signed)
Pt left vm asking if Dr. Milinda Antis thought it was ok for her to take 1/2 tab hydrocodone PRN for knee pain.  She said that a few months back, she broke out into a rash and it was thought that she was allergic to the medication but she is not.

## 2013-07-29 NOTE — Telephone Encounter (Signed)
If she is 100% sure she is not allergic to it and feels she really needs it -that is ok with me -do not mix with other pain medications  Please remove from allergy list

## 2013-07-30 NOTE — Telephone Encounter (Signed)
Pt notified of Dr. Royden Purl recommendations, med removed from allergy list

## 2013-08-28 ENCOUNTER — Other Ambulatory Visit: Payer: Self-pay | Admitting: Family Medicine

## 2013-08-28 NOTE — Telephone Encounter (Signed)
Px written for call in   

## 2013-08-28 NOTE — Telephone Encounter (Signed)
Rx called in as prescribed 

## 2013-08-28 NOTE — Telephone Encounter (Signed)
Electronic refill request, please advise  

## 2013-09-01 ENCOUNTER — Ambulatory Visit (INDEPENDENT_AMBULATORY_CARE_PROVIDER_SITE_OTHER): Payer: Medicare Other

## 2013-09-01 DIAGNOSIS — Z23 Encounter for immunization: Secondary | ICD-10-CM

## 2013-11-06 ENCOUNTER — Other Ambulatory Visit: Payer: Self-pay

## 2013-11-06 DIAGNOSIS — Z1231 Encounter for screening mammogram for malignant neoplasm of breast: Secondary | ICD-10-CM

## 2013-12-07 ENCOUNTER — Telehealth: Payer: Self-pay | Admitting: Family Medicine

## 2013-12-07 DIAGNOSIS — E559 Vitamin D deficiency, unspecified: Secondary | ICD-10-CM

## 2013-12-07 DIAGNOSIS — M949 Disorder of cartilage, unspecified: Secondary | ICD-10-CM

## 2013-12-07 DIAGNOSIS — E039 Hypothyroidism, unspecified: Secondary | ICD-10-CM

## 2013-12-07 DIAGNOSIS — E785 Hyperlipidemia, unspecified: Secondary | ICD-10-CM

## 2013-12-07 DIAGNOSIS — Z0000 Encounter for general adult medical examination without abnormal findings: Secondary | ICD-10-CM | POA: Insufficient documentation

## 2013-12-07 DIAGNOSIS — E538 Deficiency of other specified B group vitamins: Secondary | ICD-10-CM

## 2013-12-07 DIAGNOSIS — M899 Disorder of bone, unspecified: Secondary | ICD-10-CM

## 2013-12-07 NOTE — Telephone Encounter (Signed)
Message copied by Abner Greenspan on Mon Dec 07, 2013  7:28 PM ------      Message from: Ellamae Sia      Created: Fri Nov 27, 2013 10:00 AM      Regarding: Lab orders for Tuesday, 1.13.15       Patient is scheduled for CPX labs, please order future labs, Thanks , Terri       ------

## 2013-12-08 ENCOUNTER — Other Ambulatory Visit (INDEPENDENT_AMBULATORY_CARE_PROVIDER_SITE_OTHER): Payer: Medicare Other

## 2013-12-08 DIAGNOSIS — E538 Deficiency of other specified B group vitamins: Secondary | ICD-10-CM

## 2013-12-08 DIAGNOSIS — M899 Disorder of bone, unspecified: Secondary | ICD-10-CM

## 2013-12-08 DIAGNOSIS — E785 Hyperlipidemia, unspecified: Secondary | ICD-10-CM

## 2013-12-08 DIAGNOSIS — M949 Disorder of cartilage, unspecified: Secondary | ICD-10-CM

## 2013-12-08 DIAGNOSIS — E559 Vitamin D deficiency, unspecified: Secondary | ICD-10-CM

## 2013-12-08 DIAGNOSIS — E162 Hypoglycemia, unspecified: Secondary | ICD-10-CM

## 2013-12-08 DIAGNOSIS — Z0000 Encounter for general adult medical examination without abnormal findings: Secondary | ICD-10-CM

## 2013-12-08 DIAGNOSIS — E039 Hypothyroidism, unspecified: Secondary | ICD-10-CM

## 2013-12-08 LAB — COMPREHENSIVE METABOLIC PANEL
ALBUMIN: 3.8 g/dL (ref 3.5–5.2)
ALK PHOS: 63 U/L (ref 39–117)
ALT: 13 U/L (ref 0–35)
AST: 17 U/L (ref 0–37)
BUN: 11 mg/dL (ref 6–23)
CALCIUM: 8.9 mg/dL (ref 8.4–10.5)
CHLORIDE: 109 meq/L (ref 96–112)
CO2: 29 mEq/L (ref 19–32)
Creatinine, Ser: 0.8 mg/dL (ref 0.4–1.2)
GFR: 73.56 mL/min (ref >60.00)
Glucose, Bld: 78 mg/dL (ref 70–99)
POTASSIUM: 3.8 meq/L (ref 3.5–5.1)
SODIUM: 143 meq/L (ref 135–145)
Total Bilirubin: 0.4 mg/dL (ref 0.3–1.2)
Total Protein: 6.8 g/dL (ref 6.0–8.3)

## 2013-12-08 LAB — VITAMIN B12: Vitamin B-12: 476 pg/mL (ref 211–911)

## 2013-12-08 LAB — CBC WITH DIFFERENTIAL/PLATELET
BASOS PCT: 0.4 % (ref 0.0–3.0)
Basophils Absolute: 0 10*3/uL (ref 0.0–0.1)
EOS ABS: 0.4 10*3/uL (ref 0.0–0.7)
Eosinophils Relative: 7.3 % — ABNORMAL HIGH (ref 0.0–5.0)
HEMATOCRIT: 38.3 % (ref 36.0–46.0)
HEMOGLOBIN: 12.7 g/dL (ref 12.0–15.0)
LYMPHS PCT: 54.5 % — AB (ref 12.0–46.0)
Lymphs Abs: 3.2 10*3/uL (ref 0.7–4.0)
MCHC: 33.2 g/dL (ref 30.0–36.0)
MCV: 92.4 fl (ref 78.0–100.0)
Monocytes Absolute: 0.3 10*3/uL (ref 0.1–1.0)
Monocytes Relative: 4.9 % (ref 3.0–12.0)
Neutro Abs: 1.9 10*3/uL (ref 1.4–7.7)
Neutrophils Relative %: 32.9 % — ABNORMAL LOW (ref 43.0–77.0)
Platelets: 201 10*3/uL (ref 150.0–400.0)
RBC: 4.14 Mil/uL (ref 3.87–5.11)
RDW: 13.2 % (ref 11.5–14.6)
WBC: 5.9 10*3/uL (ref 4.5–10.5)

## 2013-12-08 LAB — LIPID PANEL
Cholesterol: 234 mg/dL — ABNORMAL HIGH (ref 0–200)
HDL: 66.7 mg/dL (ref >39.00)
TRIGLYCERIDES: 86 mg/dL (ref 0.0–149.0)
Total CHOL/HDL Ratio: 4
VLDL: 17.2 mg/dL (ref 0.0–40.0)

## 2013-12-08 LAB — TSH: TSH: 1.97 u[IU]/mL (ref 0.35–5.50)

## 2013-12-08 LAB — LDL CHOLESTEROL, DIRECT: LDL DIRECT: 149.5 mg/dL

## 2013-12-09 LAB — VITAMIN D 25 HYDROXY (VIT D DEFICIENCY, FRACTURES): VIT D 25 HYDROXY: 43 ng/mL (ref 30–89)

## 2013-12-10 ENCOUNTER — Ambulatory Visit
Admission: RE | Admit: 2013-12-10 | Discharge: 2013-12-10 | Disposition: A | Discharge status: Home / Self Care | Payer: Medicare Other | Source: Ambulatory Visit

## 2013-12-10 DIAGNOSIS — Z1231 Encounter for screening mammogram for malignant neoplasm of breast: Secondary | ICD-10-CM

## 2013-12-15 ENCOUNTER — Ambulatory Visit (INDEPENDENT_AMBULATORY_CARE_PROVIDER_SITE_OTHER): Payer: Medicare Other | Admitting: Family Medicine

## 2013-12-15 ENCOUNTER — Encounter: Payer: Self-pay | Admitting: Family Medicine

## 2013-12-15 ENCOUNTER — Other Ambulatory Visit: Payer: Self-pay | Admitting: Family Medicine

## 2013-12-15 VITALS — BP 130/80 | HR 72 | Temp 98.4°F | Ht 66.75 in | Wt 157.5 lb

## 2013-12-15 DIAGNOSIS — M899 Disorder of bone, unspecified: Secondary | ICD-10-CM

## 2013-12-15 DIAGNOSIS — M949 Disorder of cartilage, unspecified: Secondary | ICD-10-CM

## 2013-12-15 DIAGNOSIS — E559 Vitamin D deficiency, unspecified: Secondary | ICD-10-CM

## 2013-12-15 DIAGNOSIS — E785 Hyperlipidemia, unspecified: Secondary | ICD-10-CM

## 2013-12-15 DIAGNOSIS — E039 Hypothyroidism, unspecified: Secondary | ICD-10-CM

## 2013-12-15 DIAGNOSIS — E538 Deficiency of other specified B group vitamins: Secondary | ICD-10-CM

## 2013-12-15 DIAGNOSIS — Z0000 Encounter for general adult medical examination without abnormal findings: Secondary | ICD-10-CM

## 2013-12-15 MED ORDER — SYNTHROID 88 MCG PO TABS: 88.0000 ug | ORAL_TABLET | Freq: Every day | ORAL | Status: DC

## 2013-12-15 NOTE — Patient Instructions (Signed)
Blood pressure is better on 2nd check today  Keep discussing options for psoriasis with your dermatologist  Take care of yourself Avoid red meat/ fried foods/ egg yolks/ fatty breakfast meats/ butter, cheese and high fat dairy/ and shellfish   Get back to exercise    Fat and Cholesterol Control Diet Fat and cholesterol levels in your blood and organs are influenced by your diet. High levels of fat and cholesterol may lead to diseases of the heart, small and large blood vessels, gallbladder, liver, and pancreas. CONTROLLING FAT AND CHOLESTEROL WITH DIET Although exercise and lifestyle factors are important, your diet is key. That is because certain foods are known to raise cholesterol and others to lower it. The goal is to balance foods for their effect on cholesterol and more importantly, to replace saturated and trans fat with other types of fat, such as monounsaturated fat, polyunsaturated fat, and omega-3 fatty acids. On average, a person should consume no more than 15 to 17 g of saturated fat daily. Saturated and trans fats are considered "bad" fats, and they will raise LDL cholesterol. Saturated fats are primarily found in animal products such as meats, butter, and cream. However, that does not mean you need to give up all your favorite foods. Today, there are good tasting, low-fat, low-cholesterol substitutes for most of the things you like to eat. Choose low-fat or nonfat alternatives. Choose round or loin cuts of red meat. These types of cuts are lowest in fat and cholesterol. Chicken (without the skin), fish, veal, and ground [place] breast are great choices. Eliminate fatty meats, such as hot dogs and salami. Even shellfish have little or no saturated fat. Have a 3 oz (85 g) portion when you eat lean meat, poultry, or fish. Trans fats are also called "partially hydrogenated oils." They are oils that have been scientifically manipulated so that they are solid at room temperature resulting in a  longer shelf life and improved taste and texture of foods in which they are added. Trans fats are found in stick margarine, some tub margarines, cookies, crackers, and baked goods.  When baking and cooking, oils are a great substitute for butter. The monounsaturated oils are especially beneficial since it is believed they lower LDL and raise HDL. The oils you should avoid entirely are saturated tropical oils, such as coconut and palm.  Remember to eat a lot from food groups that are naturally free of saturated and trans fat, including fish, fruit, vegetables, beans, grains (barley, rice, couscous, bulgur wheat), and pasta (without cream sauces).  IDENTIFYING FOODS THAT LOWER FAT AND CHOLESTEROL  Soluble fiber may lower your cholesterol. This type of fiber is found in fruits such as apples, vegetables such as broccoli, potatoes, and carrots, legumes such as beans, peas, and lentils, and grains such as barley. Foods fortified with plant sterols (phytosterol) may also lower cholesterol. You should eat at least 2 g per day of these foods for a cholesterol lowering effect.  Read package labels to identify low-saturated fats, trans fat free, and low-fat foods at the supermarket. Select cheeses that have only 2 to 3 g saturated fat per ounce. Use a heart-healthy tub margarine that is free of trans fats or partially hydrogenated oil. When buying baked goods (cookies, crackers), avoid partially hydrogenated oils. Breads and muffins should be made from whole grains (whole-wheat or whole oat flour, instead of "flour" or "enriched flour"). Buy non-creamy canned soups with reduced salt and no added fats.  FOOD PREPARATION TECHNIQUES  Never  deep-fry. If you must fry, either stir-fry, which uses very little fat, or use non-stick cooking sprays. When possible, broil, bake, or roast meats, and steam vegetables. Instead of putting butter or margarine on vegetables, use lemon and herbs, applesauce, and cinnamon (for squash and  sweet potatoes). Use nonfat yogurt, salsa, and low-fat dressings for salads.  LOW-SATURATED FAT / LOW-FAT FOOD SUBSTITUTES Meats / Saturated Fat (g)  Avoid: Steak, marbled (3 oz/85 g) / 11 g  Choose: Steak, lean (3 oz/85 g) / 4 g  Avoid: Hamburger (3 oz/85 g) / 7 g  Choose: Hamburger, lean (3 oz/85 g) / 5 g  Avoid: Ham (3 oz/85 g) / 6 g  Choose: Ham, lean cut (3 oz/85 g) / 2.4 g  Avoid: Chicken, with skin, dark meat (3 oz/85 g) / 4 g  Choose: Chicken, skin removed, dark meat (3 oz/85 g) / 2 g  Avoid: Chicken, with skin, light meat (3 oz/85 g) / 2.5 g  Choose: Chicken, skin removed, light meat (3 oz/85 g) / 1 g Dairy / Saturated Fat (g)  Avoid: Whole milk (1 cup) / 5 g  Choose: Low-fat milk, 2% (1 cup) / 3 g  Choose: Low-fat milk, 1% (1 cup) / 1.5 g  Choose: Skim milk (1 cup) / 0.3 g  Avoid: Hard cheese (1 oz/28 g) / 6 g  Choose: Skim milk cheese (1 oz/28 g) / 2 to 3 g  Avoid: Cottage cheese, 4% fat (1 cup) / 6.5 g  Choose: Low-fat cottage cheese, 1% fat (1 cup) / 1.5 g  Avoid: Ice cream (1 cup) / 9 g  Choose: Sherbet (1 cup) / 2.5 g  Choose: Nonfat frozen yogurt (1 cup) / 0.3 g  Choose: Frozen fruit bar / trace  Avoid: Whipped cream (1 tbs) / 3.5 g  Choose: Nondairy whipped topping (1 tbs) / 1 g Condiments / Saturated Fat (g)  Avoid: Mayonnaise (1 tbs) / 2 g  Choose: Low-fat mayonnaise (1 tbs) / 1 g  Avoid: Butter (1 tbs) / 7 g  Choose: Extra light margarine (1 tbs) / 1 g  Avoid: Coconut oil (1 tbs) / 11.8 g  Choose: Olive oil (1 tbs) / 1.8 g  Choose: Corn oil (1 tbs) / 1.7 g  Choose: Safflower oil (1 tbs) / 1.2 g  Choose: Sunflower oil (1 tbs) / 1.4 g  Choose: Soybean oil (1 tbs) / 2.4 g  Choose: Canola oil (1 tbs) / 1 g Document Released: 11/12/2005 Document Revised: 03/09/2013 Document Reviewed: 05/03/2011 [employer] Patient Information 2014 [place], [place]. Fat and Cholesterol Control Diet Fat and cholesterol levels in your blood and  organs are influenced by your diet. High levels of fat and cholesterol may lead to diseases of the heart, small and large blood vessels, gallbladder, liver, and pancreas. CONTROLLING FAT AND CHOLESTEROL WITH DIET Although exercise and lifestyle factors are important, your diet is key. That is because certain foods are known to raise cholesterol and others to lower it. The goal is to balance foods for their effect on cholesterol and more importantly, to replace saturated and trans fat with other types of fat, such as monounsaturated fat, polyunsaturated fat, and omega-3 fatty acids. On average, a person should consume no more than 15 to 17 g of saturated fat daily. Saturated and trans fats are considered "bad" fats, and they will raise LDL cholesterol. Saturated fats are primarily found in animal products such as meats, butter, and cream. However, that does not mean you  need to give up all your favorite foods. Today, there are good tasting, low-fat, low-cholesterol substitutes for most of the things you like to eat. Choose low-fat or nonfat alternatives. Choose round or loin cuts of red meat. These types of cuts are lowest in fat and cholesterol. Chicken (without the skin), fish, veal, and ground [place] breast are great choices. Eliminate fatty meats, such as hot dogs and salami. Even shellfish have little or no saturated fat. Have a 3 oz (85 g) portion when you eat lean meat, poultry, or fish. Trans fats are also called "partially hydrogenated oils." They are oils that have been scientifically manipulated so that they are solid at room temperature resulting in a longer shelf life and improved taste and texture of foods in which they are added. Trans fats are found in stick margarine, some tub margarines, cookies, crackers, and baked goods.  When baking and cooking, oils are a great substitute for butter. The monounsaturated oils are especially beneficial since it is believed they lower LDL and raise HDL. The  oils you should avoid entirely are saturated tropical oils, such as coconut and palm.  Remember to eat a lot from food groups that are naturally free of saturated and trans fat, including fish, fruit, vegetables, beans, grains (barley, rice, couscous, bulgur wheat), and pasta (without cream sauces).  IDENTIFYING FOODS THAT LOWER FAT AND CHOLESTEROL  Soluble fiber may lower your cholesterol. This type of fiber is found in fruits such as apples, vegetables such as broccoli, potatoes, and carrots, legumes such as beans, peas, and lentils, and grains such as barley. Foods fortified with plant sterols (phytosterol) may also lower cholesterol. You should eat at least 2 g per day of these foods for a cholesterol lowering effect.  Read package labels to identify low-saturated fats, trans fat free, and low-fat foods at the supermarket. Select cheeses that have only 2 to 3 g saturated fat per ounce. Use a heart-healthy tub margarine that is free of trans fats or partially hydrogenated oil. When buying baked goods (cookies, crackers), avoid partially hydrogenated oils. Breads and muffins should be made from whole grains (whole-wheat or whole oat flour, instead of "flour" or "enriched flour"). Buy non-creamy canned soups with reduced salt and no added fats.  FOOD PREPARATION TECHNIQUES  Never deep-fry. If you must fry, either stir-fry, which uses very little fat, or use non-stick cooking sprays. When possible, broil, bake, or roast meats, and steam vegetables. Instead of putting butter or margarine on vegetables, use lemon and herbs, applesauce, and cinnamon (for squash and sweet potatoes). Use nonfat yogurt, salsa, and low-fat dressings for salads.  LOW-SATURATED FAT / LOW-FAT FOOD SUBSTITUTES Meats / Saturated Fat (g)  Avoid: Steak, marbled (3 oz/85 g) / 11 g  Choose: Steak, lean (3 oz/85 g) / 4 g  Avoid: Hamburger (3 oz/85 g) / 7 g  Choose: Hamburger, lean (3 oz/85 g) / 5 g  Avoid: Ham (3 oz/85 g) / 6  g  Choose: Ham, lean cut (3 oz/85 g) / 2.4 g  Avoid: Chicken, with skin, dark meat (3 oz/85 g) / 4 g  Choose: Chicken, skin removed, dark meat (3 oz/85 g) / 2 g  Avoid: Chicken, with skin, light meat (3 oz/85 g) / 2.5 g  Choose: Chicken, skin removed, light meat (3 oz/85 g) / 1 g Dairy / Saturated Fat (g)  Avoid: Whole milk (1 cup) / 5 g  Choose: Low-fat milk, 2% (1 cup) / 3 g  Choose: Low-fat milk,  1% (1 cup) / 1.5 g  Choose: Skim milk (1 cup) / 0.3 g  Avoid: Hard cheese (1 oz/28 g) / 6 g  Choose: Skim milk cheese (1 oz/28 g) / 2 to 3 g  Avoid: [place], 4% fat (1 cup) / 6.5 g  Choose: Low-fat cottage cheese, 1% fat (1 cup) / 1.5 g  Avoid: Ice cream (1 cup) / 9 g  Choose: Sherbet (1 cup) / 2.5 g  Choose: Nonfat frozen yogurt (1 cup) / 0.3 g  Choose: Frozen fruit bar / trace  Avoid: Whipped cream (1 tbs) / 3.5 g  Choose: Nondairy whipped topping (1 tbs) / 1 g Condiments / Saturated Fat (g)  Avoid: Mayonnaise (1 tbs) / 2 g  Choose: Low-fat mayonnaise (1 tbs) / 1 g  Avoid: Butter (1 tbs) / 7 g  Choose: Extra light margarine (1 tbs) / 1 g  Avoid: Coconut oil (1 tbs) / 11.8 g  Choose: Olive oil (1 tbs) / 1.8 g  Choose: Corn oil (1 tbs) / 1.7 g  Choose: Safflower oil (1 tbs) / 1.2 g  Choose: Sunflower oil (1 tbs) / 1.4 g  Choose: Soybean oil (1 tbs) / 2.4 g  Choose: Canola oil (1 tbs) / 1 g Document Released: 11/12/2005 Document Revised: 03/09/2013 Document Reviewed: 05/03/2011 [employer] Patient Information 2014 [place], [place].

## 2013-12-15 NOTE — Telephone Encounter (Signed)
Electronic refill request, please advise  

## 2013-12-15 NOTE — Progress Notes (Signed)
Pre-visit discussion using our clinic review tool. No additional management support is needed unless otherwise documented below in the visit note.  

## 2013-12-15 NOTE — Telephone Encounter (Signed)
I saw her today and she says she is no longer taking it -thanks

## 2013-12-15 NOTE — Progress Notes (Signed)
Subjective:    Patient ID: Carla Kelly, female    DOB: 1933/02/23, 80 y.o.   MRN: [id number]  HPI I have personally reviewed the Medicare Annual Wellness questionnaire and have noted 1. The patient's medical and social history 2. Their use of alcohol, tobacco or illicit drugs 3. Their current medications and supplements 4. The patient's functional ability including ADL's, fall risks, home safety risks and hearing or visual             impairment. 5. Diet and physical activities 6. Evidence for depression or mood disorders  The patients weight, height, BMI have been recorded in the chart and visual acuity is per eye clinic.  I have made referrals, counseling and provided education to the patient based review of the above and I have provided the pt with a written personalized care plan for preventive services.  Psoriasis is still giving her a lot of problems- makes her miserable  Sees dermatology  Never gets totally better   See scanned forms.  Routine anticipatory guidance given to patient.  See health maintenance. Flu vaccine 10/14 Shingles - not interested in vaccine/declines  PNA 9/04 vaccine Tetanus 1/14 vaccine  Colonoscopy 7/11 hx of polyps- pt thinks she grad to a 10 year recall  Breast cancer screening 1/15 mammogram-per pt it was normal Self exam -no lumps  Advance directive-she does not have a living will set up yet  Cognitive function addressed- see scanned forms- and if abnormal then additional documentation follows. -good memory overall   PMH and SH reviewed  Meds, vitals, and allergies reviewed.   ROS: See HPI.  Otherwise negative.    Wt is up 5 lb with bmi of 24-always higher after the holidays   bp is up today BP Readings from Last 3 Encounters:  12/15/13 156/82  06/05/13 110/60  03/16/13 124/68    Osteopenia  12/12 dexa D level is stable at 43  B12 def Lab Results  Component Value Date   VITAMINB12 476 12/08/2013   Hypothyroidism  Pt has no  clinical changes No change in energy level/ hair or skin/ edema and no tremor Lab Results  Component Value Date   TSH 1.97 12/08/2013     Hyperlipidemia Lab Results  Component Value Date   CHOL 234* 12/08/2013   CHOL 202* 12/04/2012   CHOL 212* 09/17/2011   Lab Results  Component Value Date   HDL 66.70 12/08/2013   HDL 64.60 12/04/2012   HDL 73.60 09/17/2011   Lab Results  Component Value Date   LDLCALC 120* 09/13/2010   LDLCALC 106* 08/29/2009   Lab Results  Component Value Date   TRIG 86.0 12/08/2013   TRIG 85.0 12/04/2012   TRIG 69.0 09/17/2011   Lab Results  Component Value Date   CHOLHDL 4 12/08/2013   CHOLHDL 3 12/04/2012   CHOLHDL 3 09/17/2011   Lab Results  Component Value Date   LDLDIRECT 149.5 12/08/2013   LDLDIRECT 111.0 12/04/2012   LDLDIRECT 128.3 09/17/2011   LDL is up - "from the holidays" Ate way too much fats / junk and sweets Knows what to watch   Lab Results  Component Value Date   WBC 5.9 12/08/2013   HGB 12.7 12/08/2013   HCT 38.3 12/08/2013   MCV 92.4 12/08/2013   PLT 201.0 12/08/2013      Patient Active Problem List   Diagnosis Date Noted  . Encounter for Medicare annual wellness exam 12/07/2013  . Allergic drug rash 03/16/2013  .  Psoriasis 02/16/2013  . Other screening mammogram 08/19/2012  . Pedal edema 03/11/2012  . B12 DEFICIENCY 09/05/2009  . UNSPECIFIED VITAMIN D DEFICIENCY 11/08/2008  . HYPERLIPIDEMIA 08/25/2008  . OSTEOPENIA 08/25/2008  . Personal History of Colonic Polyps 08/25/2008  . HERPES ZOSTER, HX OF 06/03/2008  . HYPOTHYROIDISM 08/04/2007  . HYPOGLYCEMIA 08/04/2007  . EMBOLISM 08/04/2007  . ASTHMA 08/04/2007  . IRRITABLE BOWEL SYNDROME 08/04/2007  . VAGINITIS, ATROPHIC 08/04/2007  . Osteoarth NOS-Unspec 08/04/2007  . FIBROMYALGIA 08/04/2007  . URINARY INCONTINENCE 08/04/2007   Past Medical History  Diagnosis Date  . Asthma   . Hypothyroidism   . Osteoarthritis   . IBS (irritable bowel syndrome)   . Fibromyalgia   .  History of herpes zoster   . HLD (hyperlipidemia)   . Adenomatous colon polyp   . Other B-complex deficiencies   . Osteoarthrosis, unspecified whether generalized or localized, unspecified site   . Embolism and thrombosis of unspecified site   . Disorder of bone and cartilage, unspecified   . Unspecified vitamin D deficiency   . Unspecified urinary incontinence    Past Surgical History  Procedure Laterality Date  . Abdominal hysterectomy  1970    fibroids; endometriosis  . Cervical disc surgery      x 2  . Colonoscopy  2/02    polyps, BE-polyps  . US transvaginal pelvic modified  10/02    small left ovarian cyst  . Cholecystectomy  9/04  . Bladder tack  5/05  . Cataract extraction  12/13    Dr George Ina   History  Substance Use Topics  . Smoking status: Never Smoker   . Smokeless tobacco: Never Used  . Alcohol Use: No     Comment: "is allergic"   Family History  Problem Relation Age of Onset  . Heart attack Mother   . Stroke Mother     several  . Lupus Brother   . Other Sister     benign brain tumor  . Asthma Daughter   . Other Brother     heart problems  . Colon cancer      1st cousin   Allergies  Allergen Reactions  . Ergocalciferol     REACTION: GI side eff  . Morphine     REACTION: ? reaction  . Nortriptyline Hcl     REACTION: ? reaction  . Tolterodine Tartrate     REACTION: ? reaction   Current Outpatient Prescriptions on File Prior to Visit  Medication Sig Dispense Refill  . albuterol (PROVENTIL HFA;VENTOLIN HFA) 108 (90 BASE) MCG/ACT inhaler Inhale 2 puffs into the lungs every 4 (four) hours as needed.        Marland Kitchen albuterol (PROVENTIL) 4 MG tablet TAKE 1 TABLET (4 MG TOTAL) BY MOUTH EVERY 4 (FOUR) HOURS AS NEEDED.  120 tablet  5  . aspirin 325 MG tablet Take 325 mg by mouth daily as needed.        . Calcium 1500 MG tablet Take 1,500 mg by mouth daily.        . cetirizine (ZYRTEC) 10 MG tablet Take 10 mg by mouth as needed.      . diazepam (VALIUM) 5  MG tablet TAKE 1 TABLET BY MOUTH 3 TIMES DAILY  90 tablet  1  . diclofenac (FLECTOR) 1.3 % PTCH Place 1 patch onto the skin as needed.        Marland Kitchen ESTRACE VAGINAL 0.1 MG/GM vaginal cream USE DIME SIZED AMOUNT ON URETHRAL AREA ONCE DAILY  42.5 g  3  . fluocinolone (DERMA-SMOOTHE/FS SCALP) 0.01 % external oil Apply topically 3 (three) times daily.  120 mL  1  . furosemide (LASIX) 40 MG tablet Take 1 tablet (40 mg total) by mouth 2 (two) times daily as needed. For leg swelling  60 tablet  5  . hydrocortisone 1 % ointment Apply topically as needed.      . hydrOXYzine (ATARAX/VISTARIL) 10 MG tablet Take 10 mg by mouth every 8 (eight) hours as needed for itching.      . mometasone (ELOCON) 0.1 % cream Apply topically daily. As needed  45 g  2  . Multiple Vitamin (MULTIVITAMIN) tablet Take 1 tablet by mouth daily.         No current facility-administered medications on file prior to visit.    Review of Systems Review of Systems  Constitutional: Negative for fever, appetite change, fatigue and unexpected weight change.  Eyes: Negative for pain and visual disturbance.  Respiratory: Negative for cough and shortness of breath.   Cardiovascular: Negative for cp or palpitations    Gastrointestinal: Negative for nausea, diarrhea and constipation.  Genitourinary: Negative for urgency and frequency.  Skin: Negative for pallor and pos for rash/ psoriasis Neurological: Negative for weakness, light-headedness, numbness and headaches.  Hematological: Negative for adenopathy. Does not bruise/bleed easily.  Psychiatric/Behavioral: Negative for dysphoric mood. The patient is not nervous/anxious.         Objective:   Physical Exam  Constitutional: She appears well-developed and well-nourished. No distress.  HENT:  Head: Normocephalic and atraumatic.  Right Ear: External ear normal.  Left Ear: External ear normal.  Mouth/Throat: Oropharynx is clear and moist.  Eyes: Conjunctivae and EOM are normal. Pupils are  equal, round, and reactive to light. No scleral icterus.  Neck: Normal range of motion. Neck supple. No JVD present. Carotid bruit is not present. No thyromegaly present.  Cardiovascular: Normal rate, regular rhythm, normal heart sounds and intact distal pulses.  Exam reveals no gallop.   Pulmonary/Chest: Effort normal and breath sounds normal. No respiratory distress. She has no wheezes. She exhibits no tenderness.  Abdominal: Soft. Bowel sounds are normal. She exhibits no distension, no abdominal bruit and no mass. There is no tenderness.  Genitourinary: No breast swelling, tenderness, discharge or bleeding.  Breast exam: No mass, nodules, thickening, tenderness, bulging, retraction, inflamation, nipple discharge or skin changes noted.  No axillary or clavicular LA.      Musculoskeletal: Normal range of motion. She exhibits no edema and no tenderness.  Lymphadenopathy:    She has no cervical adenopathy.  Neurological: She is alert. She has normal reflexes. No cranial nerve deficit. She exhibits normal muscle tone. Coordination normal.  Skin: Skin is warm and dry. Rash noted. There is erythema. No pallor.  Area of psoriasis post scalp and L arm today  Psychiatric: She has a normal mood and affect.          Assessment & Plan:

## 2013-12-16 NOTE — Assessment & Plan Note (Signed)
Hypothyroidism  Pt has no clinical changes No change in energy level/ hair or skin/ edema and no tremor Lab Results  Component Value Date   TSH 1.97 12/08/2013

## 2013-12-16 NOTE — Assessment & Plan Note (Signed)
Disc goals for lipids and reasons to control them LDL is up - per pt from holiday eating  Rev labs with pt Rev low sat fat diet in detail  Given a diet handout for cholesterol Will continue to monitor

## 2013-12-16 NOTE — Assessment & Plan Note (Signed)
Lab Results  Component Value Date   [id number] 12/08/2013   Ok on current regimen

## 2013-12-16 NOTE — Assessment & Plan Note (Signed)
Pt not ready to schedule f/u dexa yet No fragility fx Disc need for calcium/ vitamin D/ wt bearing exercise and bone density test every 2 y to monitor Disc safety/ fracture risk in detail

## 2013-12-16 NOTE — Assessment & Plan Note (Signed)
Reviewed health habits including diet and exercise and skin cancer prevention Reviewed appropriate screening tests for age  Also reviewed health mt list, fam hx and immunization status , as well as social and family history   See HPI Labs reviewed  

## 2013-12-16 NOTE — Assessment & Plan Note (Signed)
D level 53 with current supplementation  Disc imp to bone and overall health

## 2013-12-18 ENCOUNTER — Encounter: Payer: Self-pay | Admitting: Family Medicine

## 2014-01-08 ENCOUNTER — Other Ambulatory Visit: Payer: Self-pay | Admitting: Family Medicine

## 2014-01-08 MED ORDER — TRAMADOL HCL 50 MG PO TABS
50.0000 mg | ORAL_TABLET | Freq: Three times a day (TID) | ORAL | Status: DC | PRN
Start: 2014-01-08 — End: 2014-04-09

## 2014-01-08 NOTE — Telephone Encounter (Signed)
The sig on the valium it to take 1 tab TID prn. Called pt and she is taking 1 tab every night to help sleep and then a 2nd tab during the day as needed so it wasn't a 36month supply with her last refill.   Pt is also requesting a refill of tramadol, pt said she can't take codeine/hydrocodone anymore so is requesting that you refill her tramadol, please advise

## 2014-01-08 NOTE — Telephone Encounter (Signed)
Electronic refill request, please advise  

## 2014-01-08 NOTE — Telephone Encounter (Signed)
Is this req too early -- 89 with 1 refill 4 mo ago?

## 2014-01-08 NOTE — Telephone Encounter (Signed)
Px written for call in   

## 2014-01-09 ENCOUNTER — Other Ambulatory Visit: Payer: Self-pay | Admitting: Family Medicine

## 2014-01-11 NOTE — Telephone Encounter (Signed)
Rxs called in as prescribed  

## 2014-01-11 NOTE — Telephone Encounter (Signed)
Electronic refill request, please advise  

## 2014-01-11 NOTE — Telephone Encounter (Signed)
Chart says valium was refilled 3 d ago ? Please refill tramadol times one

## 2014-01-24 DIAGNOSIS — G454 Transient global amnesia: Secondary | ICD-10-CM

## 2014-01-24 HISTORY — DX: Transient global amnesia: G45.4

## 2014-02-20 ENCOUNTER — Emergency Department (HOSPITAL_COMMUNITY): Payer: Medicare Other

## 2014-02-20 ENCOUNTER — Encounter (HOSPITAL_COMMUNITY): Payer: Self-pay | Admitting: Emergency Medicine

## 2014-02-20 ENCOUNTER — Observation Stay (HOSPITAL_COMMUNITY)
Admission: EM | Admit: 2014-02-20 | Discharge: 2014-02-21 | Disposition: A | Discharge status: Home / Self Care | Payer: Medicare Other | Attending: Internal Medicine | Admitting: Internal Medicine

## 2014-02-20 DIAGNOSIS — E039 Hypothyroidism, unspecified: Secondary | ICD-10-CM | POA: Insufficient documentation

## 2014-02-20 DIAGNOSIS — R51 Headache: Secondary | ICD-10-CM | POA: Insufficient documentation

## 2014-02-20 DIAGNOSIS — R32 Unspecified urinary incontinence: Secondary | ICD-10-CM | POA: Insufficient documentation

## 2014-02-20 DIAGNOSIS — I639 Cerebral infarction, unspecified: Secondary | ICD-10-CM

## 2014-02-20 DIAGNOSIS — Z86718 Personal history of other venous thrombosis and embolism: Secondary | ICD-10-CM | POA: Insufficient documentation

## 2014-02-20 DIAGNOSIS — I6529 Occlusion and stenosis of unspecified carotid artery: Secondary | ICD-10-CM | POA: Insufficient documentation

## 2014-02-20 DIAGNOSIS — Z7982 Long term (current) use of aspirin: Secondary | ICD-10-CM | POA: Insufficient documentation

## 2014-02-20 DIAGNOSIS — R412 Retrograde amnesia: Secondary | ICD-10-CM

## 2014-02-20 DIAGNOSIS — G454 Transient global amnesia: Principal | ICD-10-CM | POA: Insufficient documentation

## 2014-02-20 DIAGNOSIS — R413 Other amnesia: Secondary | ICD-10-CM

## 2014-02-20 DIAGNOSIS — IMO0001 Reserved for inherently not codable concepts without codable children: Secondary | ICD-10-CM | POA: Insufficient documentation

## 2014-02-20 DIAGNOSIS — M1990 Unspecified osteoarthritis, unspecified site: Secondary | ICD-10-CM | POA: Insufficient documentation

## 2014-02-20 DIAGNOSIS — I658 Occlusion and stenosis of other precerebral arteries: Secondary | ICD-10-CM | POA: Insufficient documentation

## 2014-02-20 DIAGNOSIS — J45909 Unspecified asthma, uncomplicated: Secondary | ICD-10-CM | POA: Insufficient documentation

## 2014-02-20 DIAGNOSIS — E559 Vitamin D deficiency, unspecified: Secondary | ICD-10-CM | POA: Insufficient documentation

## 2014-02-20 DIAGNOSIS — E785 Hyperlipidemia, unspecified: Secondary | ICD-10-CM | POA: Insufficient documentation

## 2014-02-20 DIAGNOSIS — E162 Hypoglycemia, unspecified: Secondary | ICD-10-CM | POA: Insufficient documentation

## 2014-02-20 LAB — CBC WITH DIFFERENTIAL/PLATELET
BASOS PCT: 0 % (ref 0–1)
Basophils Absolute: 0 10*3/uL (ref 0.0–0.1)
EOS PCT: 3 % (ref 0–5)
Eosinophils Absolute: 0.2 10*3/uL (ref 0.0–0.7)
HCT: 37.9 % (ref 36.0–46.0)
Hemoglobin: 13.3 g/dL (ref 12.0–15.0)
Lymphocytes Relative: 46 % (ref 12–46)
Lymphs Abs: 2.3 10*3/uL (ref 0.7–4.0)
MCH: 31.7 pg (ref 26.0–34.0)
MCHC: 35.1 g/dL (ref 30.0–36.0)
MCV: 90.2 fL (ref 78.0–100.0)
Monocytes Absolute: 0.2 10*3/uL (ref 0.1–1.0)
Monocytes Relative: 4 % (ref 3–12)
Neutro Abs: 2.3 10*3/uL (ref 1.7–7.7)
Neutrophils Relative %: 47 % (ref 43–77)
PLATELETS: 159 10*3/uL (ref 150–400)
RBC: 4.2 MIL/uL (ref 3.87–5.11)
RDW: 12.5 % (ref 11.5–15.5)
WBC: 5 10*3/uL (ref 4.0–10.5)

## 2014-02-20 LAB — BASIC METABOLIC PANEL
BUN: 14 mg/dL (ref 6–23)
CO2: 26 mEq/L (ref 19–32)
Calcium: 9.4 mg/dL (ref 8.4–10.5)
Chloride: 101 mEq/L (ref 96–112)
Creatinine, Ser: 0.66 mg/dL (ref 0.50–1.10)
GFR calc Af Amer: >90 mL/min (ref >90)
GFR, EST NON AFRICAN AMERICAN: 82 mL/min — AB (ref >90)
Glucose, Bld: 82 mg/dL (ref 70–99)
Potassium: 4 mEq/L (ref 3.7–5.3)
SODIUM: 139 meq/L (ref 137–147)

## 2014-02-20 LAB — URINALYSIS, ROUTINE W REFLEX MICROSCOPIC
Bilirubin Urine: NEGATIVE
Glucose, UA: NEGATIVE mg/dL
Hgb urine dipstick: NEGATIVE
Ketones, ur: NEGATIVE mg/dL
Leukocytes, UA: NEGATIVE
Nitrite: NEGATIVE
Protein, ur: NEGATIVE mg/dL
Specific Gravity, Urine: 1.011 (ref 1.005–1.030)
UROBILINOGEN UA: 0.2 mg/dL (ref 0.0–1.0)
pH: 7.5 (ref 5.0–8.0)

## 2014-02-20 LAB — RAPID URINE DRUG SCREEN, HOSP PERFORMED
AMPHETAMINES: NOT DETECTED
Barbiturates: NOT DETECTED
Benzodiazepines: NOT DETECTED
Cocaine: NOT DETECTED
OPIATES: NOT DETECTED
TETRAHYDROCANNABINOL: NOT DETECTED

## 2014-02-20 LAB — GLUCOSE, CAPILLARY: GLUCOSE-CAPILLARY: 98 mg/dL (ref 70–99)

## 2014-02-20 MED ORDER — LEVOTHYROXINE SODIUM 88 MCG PO TABS
88.0000 ug | ORAL_TABLET | Freq: Every day | ORAL | Status: DC
  Administered 2014-02-21: 88 ug via ORAL
  Filled 2014-02-20 (×2): qty 1

## 2014-02-20 MED ORDER — DIAZEPAM 5 MG PO TABS
5.0000 mg | ORAL_TABLET | Freq: Every day | ORAL | Status: DC
  Administered 2014-02-20: 5 mg via ORAL
  Filled 2014-02-20: qty 1

## 2014-02-20 MED ORDER — ASPIRIN 325 MG PO TABS
325.0000 mg | ORAL_TABLET | Freq: Every day | ORAL | Status: DC
  Administered 2014-02-21: 325 mg via ORAL
  Filled 2014-02-20: qty 1

## 2014-02-20 MED ORDER — ASPIRIN 81 MG PO CHEW
324.0000 mg | CHEWABLE_TABLET | Freq: Once | ORAL | Status: AC
  Administered 2014-02-20: 324 mg via ORAL
  Filled 2014-02-20: qty 4

## 2014-02-20 MED ORDER — GADOBENATE DIMEGLUMINE 529 MG/ML IV SOLN
15.0000 mL | Freq: Once | INTRAVENOUS | Status: AC | PRN
  Administered 2014-02-20: 15 mL via INTRAVENOUS

## 2014-02-20 MED ORDER — CALCIUM CARBONATE 1250 (500 CA) MG PO TABS
3.0000 | ORAL_TABLET | Freq: Every day | ORAL | Status: DC
  Administered 2014-02-21: 1500 mg via ORAL
  Filled 2014-02-20: qty 3

## 2014-02-20 MED ORDER — FUROSEMIDE 40 MG PO TABS
40.0000 mg | ORAL_TABLET | Freq: Every day | ORAL | Status: DC | PRN
  Filled 2014-02-20: qty 1

## 2014-02-20 MED ORDER — HYDROXYZINE HCL 10 MG PO TABS
10.0000 mg | ORAL_TABLET | Freq: Three times a day (TID) | ORAL | Status: DC | PRN
  Filled 2014-02-20: qty 1

## 2014-02-20 MED ORDER — ENOXAPARIN SODIUM 40 MG/0.4ML ~~LOC~~ SOLN
40.0000 mg | SUBCUTANEOUS | Status: DC
  Administered 2014-02-20: 40 mg via SUBCUTANEOUS
  Filled 2014-02-20 (×2): qty 0.4

## 2014-02-20 MED ORDER — TRAMADOL HCL 50 MG PO TABS: 50.0000 mg | ORAL_TABLET | Freq: Three times a day (TID) | ORAL | Status: DC | PRN

## 2014-02-20 MED ORDER — ALBUTEROL SULFATE HFA 108 (90 BASE) MCG/ACT IN AERS: 2.0000 | INHALATION_SPRAY | RESPIRATORY_TRACT | Status: DC | PRN

## 2014-02-20 MED ORDER — ADULT MULTIVITAMIN W/MINERALS CH
1.0000 | ORAL_TABLET | Freq: Every day | ORAL | Status: DC
  Administered 2014-02-21: 1 via ORAL
  Filled 2014-02-20: qty 1

## 2014-02-20 MED ORDER — ESTRADIOL 0.1 MG/GM VA CREA
1.0000 | TOPICAL_CREAM | Freq: Every day | VAGINAL | Status: DC
  Filled 2014-02-20: qty 42.5

## 2014-02-20 MED ORDER — ONDANSETRON HCL 4 MG/2ML IJ SOLN
4.0000 mg | Freq: Once | INTRAMUSCULAR | Status: AC
  Administered 2014-02-20: 4 mg via INTRAVENOUS
  Filled 2014-02-20: qty 2

## 2014-02-20 MED ORDER — ALBUTEROL SULFATE (2.5 MG/3ML) 0.083% IN NEBU: 2.5000 mg | INHALATION_SOLUTION | RESPIRATORY_TRACT | Status: DC | PRN

## 2014-02-20 NOTE — H&P (Signed)
[facility] History and Physical  Carla Kelly [id number] DOB: 1933/05/04 DOA: 02/20/2014  Referring physician: EDP PCP: Loura Pardon, MD  Specialists:   Chief Complaint:  Loss of Memory  HPI: Carla Kelly is a 80 y.o. female who was sent to the ED via EMS after she had been confused and unable to recall where her car was or recognize people around her.  Her last memory was around 11 AM when she had driven herself to her hair appointment, and afterward she did not remember anything until after 2 pm.   Her beautician called her family when she began to behave differently, and her Grand-Daughter advised the beautician to call 911, because something was not right.   The patient was evaluated in the ED and underwent an MRI/MRA of the brain which was negative for acute findings.   At the time of my interview with the patient her memory before and after the 2 hour gap had returned.   She reports having frontal headaches more often in the past 2 months and taking increased Aspirin which does relieve the headache.  She also reports having a long history of hypoglycemia, however when EMS arrived her glucose level was found to be 98.   She denies ever having any similar episodes.      Review of Systems:  Constitutional: No Weight Loss, No Weight Gain, Night Sweats, Fevers, Chills, Fatigue, or Generalized Weakness HEENT:  +Headaches, Difficulty Swallowing,Tooth/Dental Problems,Sore Throat,  No Sneezing, Rhinitis, Ear Ache, Nasal Congestion, or Post Nasal Drip,  Cardio-vascular:  No Chest pain, Orthopnea, PND, Edema in lower extremities, Anasarca, Dizziness, Palpitations  Resp: No Dyspnea, No DOE, No Cough, No Hemoptysis, No Wheezing.    GI: No Heartburn, Indigestion, Abdominal Pain, Nausea, Vomiting, Diarrhea, Change in Bowel Habits,  Loss of Appetite  GU: No Dysuria, Change in Color of Urine, No Urgency or Frequency.  No flank pain.  Musculoskeletal:  +Joint Pain or  No Joint Swelling.  No  Decreased Range of Motion. No Back Pain.  Neurologic: No Syncope, No Seizures, Muscle Weakness, Paresthesia, Vision Disturbance or Loss, No Diplopia, No Vertigo, No Difficulty Walking,  Skin: No Rash or Lesions. Psych: No Change in Mood or Affect. No Depression or Anxiety. No Hallucinations   Past Medical History  Diagnosis Date  . Asthma   . Hypothyroidism   . Osteoarthritis   . IBS (irritable bowel syndrome)   . Fibromyalgia   . History of herpes zoster   . HLD (hyperlipidemia)   . Adenomatous colon polyp   . Other B-complex deficiencies   . Osteoarthrosis, unspecified whether generalized or localized, unspecified site   . Embolism and thrombosis of unspecified site   . Disorder of bone and cartilage, unspecified   . Unspecified vitamin D deficiency   . Unspecified urinary incontinence      Past Surgical History  Procedure Laterality Date  . Abdominal hysterectomy  1970    fibroids; endometriosis  . Cervical disc surgery      x 2  . Colonoscopy  2/02    polyps, BE-polyps  . US transvaginal pelvic modified  10/02    small left ovarian cyst  . Cholecystectomy  9/04  . Bladder tack  5/05  . Cataract extraction  12/13    Dr George Ina                               Right eye   Prior  to Admission medications   Medication Sig Start Date End Date Taking? Authorizing Provider  albuterol (PROVENTIL) 4 MG tablet Take 4 mg by mouth 3 (three) times daily.   Yes Historical Provider, MD  aspirin 325 MG tablet Take 325 mg by mouth daily.    Yes Historical Provider, MD  Calcium 1500 MG tablet Take 1,500 mg by mouth daily.     Yes Historical Provider, MD  clobetasol (TEMOVATE) 0.05 % external solution Apply 1 application topically daily as needed (for psoriasis).   Yes Historical Provider, MD  diazepam (VALIUM) 5 MG tablet Take 5 mg by mouth at bedtime.   Yes Historical Provider, MD  diclofenac (FLECTOR) 1.3 % PTCH Place 1 patch onto the skin as needed (for pain).    Yes Historical  Provider, MD  estradiol (ESTRACE) 0.1 MG/GM vaginal cream Place 1 Applicatorful vaginally at bedtime.   Yes Historical Provider, MD  fluocinolone (DERMA-SMOOTHE/FS SCALP) 0.01 % external oil Apply topically 3 (three) times daily. 02/16/13  Yes Abner Greenspan, MD  furosemide (LASIX) 40 MG tablet Take 40 mg by mouth daily as needed for edema.   Yes Historical Provider, MD  hydrOXYzine (ATARAX/VISTARIL) 10 MG tablet Take 10 mg by mouth every 8 (eight) hours as needed for itching.   Yes Historical Provider, MD  mometasone (ELOCON) 0.1 % ointment Apply 1 application topically daily as needed (for psoriasis).   Yes Historical Provider, MD  Multiple Vitamin (MULTIVITAMIN) tablet Take 1 tablet by mouth daily.     Yes Historical Provider, MD  SYNTHROID 88 MCG tablet Take 1 tablet (88 mcg total) by mouth daily before breakfast. 12/15/13  Yes Abner Greenspan, MD  traMADol (ULTRAM) 50 MG tablet Take 1 tablet (50 mg total) by mouth every 8 (eight) hours as needed. 01/08/14  Yes Abner Greenspan, MD  triamcinolone (KENALOG) topical spray Apply 1 g topically 2 (two) times daily as needed (for psoriasis).    Yes Historical Provider, MD  albuterol (PROVENTIL HFA;VENTOLIN HFA) 108 (90 BASE) MCG/ACT inhaler Inhale 2 puffs into the lungs every 4 (four) hours as needed.      Historical Provider, MD      Allergies  Allergen Reactions  . Alcohol-Sulfur [Sulfur] Shortness Of Breath and Swelling  . Ergocalciferol Other (See Comments)    REACTION: GI side effects  . Morphine Nausea And Vomiting  . Tolterodine Tartrate Other (See Comments)    REACTION: ? reaction  . Myrbetriq [Mirabegron] Other (See Comments)    unknown  . Nortriptyline Hcl Other (See Comments)    unknown     Social History:  reports that she has never smoked. She has never used smokeless tobacco. She reports that she does not drink alcohol or use illicit drugs.     Family History  Problem Relation Age of Onset  . Heart attack Mother   . Stroke  Mother     several  . Lupus Brother   . Other Sister     benign brain tumor  . Asthma Daughter   . Other Brother     heart problems  . Colon cancer      1st cousin       Physical Exam:  GEN: Pleasant Well Nourished and Well Developed  80 y.o.Caucasian  female  examined  and in no acute distress; cooperative with exam Filed Vitals:   02/20/14 1530 02/20/14 1545 02/20/14 1600 02/20/14 1819  BP: 166/61 174/78 160/52 145/74  Pulse: 79 78 75 77  Temp:  TempSrc:      Resp: 19 16 19 15   SpO2: 92% 99% 99% 100%   Blood pressure 145/74, pulse 77, temperature 98 F (36.7 C), temperature source Oral, resp. rate 15, SpO2 100.00%. PSYCH: She is alert and oriented x4; does not appear anxious does not appear depressed; affect is normal HEENT: Normocephalic and Atraumatic, Mucous membranes pink; PERRLA; EOM intact; Fundi:  Benign;  No scleral icterus, Nares: Patent, Oropharynx: Clear, Fair Dentition, Neck:  FROM, no cervical lymphadenopathy nor thyromegaly or carotid bruit; no JVD; Breasts:: Not examined CHEST WALL: No tenderness CHEST: Normal respiration, clear to auscultation bilaterally HEART: Regular rate and rhythm; no murmurs rubs or gallops BACK: No kyphosis or scoliosis; no CVA tenderness ABDOMEN: Positive Bowel Sounds, soft non-tender; no masses, no organomegaly. Rectal Exam: Not done EXTREMITIES: No cyanosis, clubbing or edema; no ulcerations. Genitalia: not examined PULSES: 2+ and symmetric SKIN: Normal hydration no rash or ulceration CNS:   Mental Status:  Alert, oriented, thought content appropriate. Speech fluent without evidence of aphasia. Able to follow 3 step commands without difficulty. In No obvious pain.  Cranial Nerves:  II - XII  INTACT Motor: Function Intact,  Strength 5/5 Throughout Tone and bulk:normal tone throughout; no atrophy noted  Sensory: Pinprick and light touch intact throughout, bilaterally  Deep Tendon Reflexes: 2+ and symmetric throughout   Plantars/ Babinski:  Normal   Cerebellar: Finger to nose with or without difficulty.  Gait: Observed, Steady  Vascular: pulses palpable throughout    Labs on Admission:  Basic Metabolic Panel:  Recent Labs Lab 02/20/14 1605  NA 139  K 4.0  CL 101  CO2 26  GLUCOSE 82  BUN 14  CREATININE 0.66  CALCIUM 9.4   Liver Function Tests: No results found for this basename: AST, ALT, ALKPHOS, BILITOT, PROT, ALBUMIN,  in the last 168 hours No results found for this basename: LIPASE, AMYLASE,  in the last 168 hours No results found for this basename: AMMONIA,  in the last 168 hours CBC:  Recent Labs Lab 02/20/14 1605  WBC 5.0  NEUTROABS 2.3  HGB 13.3  HCT 37.9  MCV 90.2  PLT 159   Cardiac Enzymes: No results found for this basename: CKTOTAL, CKMB, CKMBINDEX, TROPONINI,  in the last 168 hours  BNP (last 3 results) No results found for this basename: PROBNP,  in the last 8760 hours CBG: No results found for this basename: GLUCAP,  in the last 168 hours  Radiological Exams on Admission: Mr Shirlee Latch Wo Contrast  02/20/2014   CLINICAL DATA:  80 year old female with syncopal episode, left side neck pain, right side vision loss. Initial encounter.  EXAM: MRI HEAD WITHOUT AND WITH CONTRAST  MRA HEAD WITHOUT CONTRAST  MRA NECK WITHOUT AND WITH CONTRAST  TECHNIQUE: Multiplanar, multiecho pulse sequences of the brain and surrounding structures were obtained without and with intravenous contrast. Angiographic images of the Circle of Willis were obtained using MRA technique without intravenous contrast. Angiographic images of the neck were obtained using MRA technique without and with intravenous contrast. Carotid stenosis measurements (when applicable) are obtained utilizing NASCET criteria, using the distal internal carotid diameter as the denominator.  CONTRAST:  67mL MULTIHANCE GADOBENATE DIMEGLUMINE 529 MG/ML IV SOLN  COMPARISON:  None.  FINDINGS: MRI HEAD FINDINGS  Major intracranial  vascular flow voids are preserved, the distal left vertebral artery appear dominant.  No restricted diffusion to suggest acute infarction. No midline shift, mass effect, evidence of mass lesion, ventriculomegaly, extra-axial collection or acute intracranial  hemorrhage. Cervicomedullary junction and pituitary are within normal limits. Cerebral volume is within normal limits for age. Negative visualized cervical spine. Normal bone marrow signal. Pearline Cables and white matter signal is within normal limits for age throughout the brain. No abnormal enhancement identified.  Postoperative changes to the right globe. Visible internal auditory structures appear normal. Trace inferior right mastoid fluid. Negative visualized nasopharynx. Minor paranasal sinus mucosal thickening. Visualized scalp soft tissues are within normal limits.  MRA HEAD FINDINGS  Antegrade flow in the posterior circulation. Dominant distal left vertebral artery. Normal PICA origins. The distal right vertebral artery functionally terminates in PICA. The left supplies the basilar. Mildly tortuous but otherwise normal basilar artery. Normal SCA and PCA origins. Diminutive posterior communicating arteries. Normal bilateral PCA branches.  Antegrade flow in both ICA siphons. Mild irregularity of the cavernous ICAs compatible with atherosclerosis. No ICA siphon stenosis. Ophthalmic and posterior communicating artery origins are within normal limits. Patent carotid termini.  Normal ACA origins. Mildly dominant right ACA A1 segment. Normal anterior communicating artery. Prominent median artery of the corpus callosum. Visualized ACA branches are within normal limits. Normal MCA origins. Normal visualized bilateral MCA branches.  MRA NECK FINDINGS  Precontrast time-of-flight images demonstrate antegrade flow in both carotid and vertebral arteries throughout the neck. The left vertebral artery appears mildly dominant. Both carotid bifurcations appear normal.   Post-contrast MRA images reveal a 3 vessel arch configuration with normal great vessel origins.  Normal right CCA. Normal right carotid bifurcation. Normal cervical right ICA.  Normal left CCA. Widely patent left carotid bifurcation. There is mild irregularity and filling defect in the posterior left ICA bulb compatible with atherosclerosis, but nose stenosis results. Otherwise negative cervical left ICA.  No proximal subclavian artery stenosis. Both vertebral artery origins appear normal. The left vertebral artery is dominant throughout the neck. No cervical vertebral artery stenosis identified. The non dominant right vertebral artery appears to functionally terminate in PICA.  IMPRESSION: 1. No acute intracranial abnormality. Normal for age non contrast MRI appearance of the brain. 2. Normal for age MRA head and neck; mild left ICA bulb and bilateral ICA siphon atherosclerosis.   Electronically Signed   By: Lars Pinks M.D.   On: 02/20/2014 18:43   Mr Angiogram Neck W Wo Contrast  02/20/2014   CLINICAL DATA:  80 year old female with syncopal episode, left side neck pain, right side vision loss. Initial encounter.  EXAM: MRI HEAD WITHOUT AND WITH CONTRAST  MRA HEAD WITHOUT CONTRAST  MRA NECK WITHOUT AND WITH CONTRAST  TECHNIQUE: Multiplanar, multiecho pulse sequences of the brain and surrounding structures were obtained without and with intravenous contrast. Angiographic images of the Circle of Willis were obtained using MRA technique without intravenous contrast. Angiographic images of the neck were obtained using MRA technique without and with intravenous contrast. Carotid stenosis measurements (when applicable) are obtained utilizing NASCET criteria, using the distal internal carotid diameter as the denominator.  CONTRAST:  65mL MULTIHANCE GADOBENATE DIMEGLUMINE 529 MG/ML IV SOLN  COMPARISON:  None.  FINDINGS: MRI HEAD FINDINGS  Major intracranial vascular flow voids are preserved, the distal left vertebral  artery appear dominant.  No restricted diffusion to suggest acute infarction. No midline shift, mass effect, evidence of mass lesion, ventriculomegaly, extra-axial collection or acute intracranial hemorrhage. Cervicomedullary junction and pituitary are within normal limits. Cerebral volume is within normal limits for age. Negative visualized cervical spine. Normal bone marrow signal. Pearline Cables and white matter signal is within normal limits for age throughout the brain. No abnormal  enhancement identified.  Postoperative changes to the right globe. Visible internal auditory structures appear normal. Trace inferior right mastoid fluid. Negative visualized nasopharynx. Minor paranasal sinus mucosal thickening. Visualized scalp soft tissues are within normal limits.  MRA HEAD FINDINGS  Antegrade flow in the posterior circulation. Dominant distal left vertebral artery. Normal PICA origins. The distal right vertebral artery functionally terminates in PICA. The left supplies the basilar. Mildly tortuous but otherwise normal basilar artery. Normal SCA and PCA origins. Diminutive posterior communicating arteries. Normal bilateral PCA branches.  Antegrade flow in both ICA siphons. Mild irregularity of the cavernous ICAs compatible with atherosclerosis. No ICA siphon stenosis. Ophthalmic and posterior communicating artery origins are within normal limits. Patent carotid termini.  Normal ACA origins. Mildly dominant right ACA A1 segment. Normal anterior communicating artery. Prominent median artery of the corpus callosum. Visualized ACA branches are within normal limits. Normal MCA origins. Normal visualized bilateral MCA branches.  MRA NECK FINDINGS  Precontrast time-of-flight images demonstrate antegrade flow in both carotid and vertebral arteries throughout the neck. The left vertebral artery appears mildly dominant. Both carotid bifurcations appear normal.  Post-contrast MRA images reveal a 3 vessel arch configuration with  normal great vessel origins.  Normal right CCA. Normal right carotid bifurcation. Normal cervical right ICA.  Normal left CCA. Widely patent left carotid bifurcation. There is mild irregularity and filling defect in the posterior left ICA bulb compatible with atherosclerosis, but nose stenosis results. Otherwise negative cervical left ICA.  No proximal subclavian artery stenosis. Both vertebral artery origins appear normal. The left vertebral artery is dominant throughout the neck. No cervical vertebral artery stenosis identified. The non dominant right vertebral artery appears to functionally terminate in PICA.  IMPRESSION: 1. No acute intracranial abnormality. Normal for age non contrast MRI appearance of the brain. 2. Normal for age MRA head and neck; mild left ICA bulb and bilateral ICA siphon atherosclerosis.   Electronically Signed   By: Lars Pinks M.D.   On: 02/20/2014 18:43   Mr Jeri Cos MG Contrast  02/20/2014   CLINICAL DATA:  80 year old female with syncopal episode, left side neck pain, right side vision loss. Initial encounter.  EXAM: MRI HEAD WITHOUT AND WITH CONTRAST  MRA HEAD WITHOUT CONTRAST  MRA NECK WITHOUT AND WITH CONTRAST  TECHNIQUE: Multiplanar, multiecho pulse sequences of the brain and surrounding structures were obtained without and with intravenous contrast. Angiographic images of the Circle of Willis were obtained using MRA technique without intravenous contrast. Angiographic images of the neck were obtained using MRA technique without and with intravenous contrast. Carotid stenosis measurements (when applicable) are obtained utilizing NASCET criteria, using the distal internal carotid diameter as the denominator.  CONTRAST:  44mL MULTIHANCE GADOBENATE DIMEGLUMINE 529 MG/ML IV SOLN  COMPARISON:  None.  FINDINGS: MRI HEAD FINDINGS  Major intracranial vascular flow voids are preserved, the distal left vertebral artery appear dominant.  No restricted diffusion to suggest acute infarction.  No midline shift, mass effect, evidence of mass lesion, ventriculomegaly, extra-axial collection or acute intracranial hemorrhage. Cervicomedullary junction and pituitary are within normal limits. Cerebral volume is within normal limits for age. Negative visualized cervical spine. Normal bone marrow signal. Pearline Cables and white matter signal is within normal limits for age throughout the brain. No abnormal enhancement identified.  Postoperative changes to the right globe. Visible internal auditory structures appear normal. Trace inferior right mastoid fluid. Negative visualized nasopharynx. Minor paranasal sinus mucosal thickening. Visualized scalp soft tissues are within normal limits.  MRA HEAD FINDINGS  Antegrade  flow in the posterior circulation. Dominant distal left vertebral artery. Normal PICA origins. The distal right vertebral artery functionally terminates in PICA. The left supplies the basilar. Mildly tortuous but otherwise normal basilar artery. Normal SCA and PCA origins. Diminutive posterior communicating arteries. Normal bilateral PCA branches.  Antegrade flow in both ICA siphons. Mild irregularity of the cavernous ICAs compatible with atherosclerosis. No ICA siphon stenosis. Ophthalmic and posterior communicating artery origins are within normal limits. Patent carotid termini.  Normal ACA origins. Mildly dominant right ACA A1 segment. Normal anterior communicating artery. Prominent median artery of the corpus callosum. Visualized ACA branches are within normal limits. Normal MCA origins. Normal visualized bilateral MCA branches.  MRA NECK FINDINGS  Precontrast time-of-flight images demonstrate antegrade flow in both carotid and vertebral arteries throughout the neck. The left vertebral artery appears mildly dominant. Both carotid bifurcations appear normal.  Post-contrast MRA images reveal a 3 vessel arch configuration with normal great vessel origins.  Normal right CCA. Normal right carotid bifurcation.  Normal cervical right ICA.  Normal left CCA. Widely patent left carotid bifurcation. There is mild irregularity and filling defect in the posterior left ICA bulb compatible with atherosclerosis, but nose stenosis results. Otherwise negative cervical left ICA.  No proximal subclavian artery stenosis. Both vertebral artery origins appear normal. The left vertebral artery is dominant throughout the neck. No cervical vertebral artery stenosis identified. The non dominant right vertebral artery appears to functionally terminate in PICA.  IMPRESSION: 1. No acute intracranial abnormality. Normal for age non contrast MRI appearance of the brain. 2. Normal for age MRA head and neck; mild left ICA bulb and bilateral ICA siphon atherosclerosis.   Electronically Signed   By: Lars Pinks M.D.   On: 02/20/2014 18:43      EKG: Independently reviewed.     Assessment/Plan:   80 y.o. female with  Principal Problem:   Transient global amnesia Active Problems:   HYPOTHYROIDISM   HYPOGLYCEMIA   Unspecified vitamin D deficiency   HYPERLIPIDEMIA   ASTHMA   FIBROMYALGIA     1.   Transient Global Anemia-   Symptoms resolved, ? TIA, Admit for further workup,  Neuro Checks , Check Fasting lipids in AM and TSH.  Monitor Glucose levels.  Carotid US and 2D ECHO in AM,  Neuro consult.  2.    Hypothyroid-  Check TSH level and continue Levothyroxine.    3.    Hypoglycemia- long hx,  Monitor Glucose levels.     4.    Vit D Deficiency- continue Vit D supplement.    5.    Hyperlipidemia-  On No Cholesterol Meds currently,  Check Lipids in AM.    6.    Asthma - stable, continue PRN Rescue Inhaler.    7.    Fibromyalgia-   On tramadol PRN for pain.     8.  DVT prophylaxis with Lovenox.        Code Status:  FULL CODE  Family Communication:  Family  At Bedside Disposition Plan:  Observation        Time spent:  [address] [facility] Pager [phone]  If 7PM-7AM, please contact  night-coverage [email or web address] Password [facility] 02/20/2014, 8:12 PM

## 2014-02-20 NOTE — ED Notes (Signed)
Patient to MRI.

## 2014-02-20 NOTE — ED Notes (Signed)
Report called to [facility], RN 4N.

## 2014-02-20 NOTE — ED Notes (Signed)
Pt returned from MRI °

## 2014-02-20 NOTE — ED Notes (Signed)
Patient stated she vomited in MRI. Patient returned from MRI with crackers and ginger ale.

## 2014-02-20 NOTE — ED Provider Notes (Signed)
CSN: [id number]     Arrival date & time 02/20/14  1504 History   First MD Initiated Contact with Patient 02/20/14 1506     Chief Complaint  Patient presents with  . Altered Mental Status      HPI  Patient presents with amnesia. She was in her normal state of health. Her last time no normal was 08 100 this morning. She spoke with her daughter and son both on the phone. She drove herself to beauty parlor. While there, one of the stylists who knows her well called her son. She was having repetitive speech. She kept speaking book that she stated the stylus needed to obtain. She cannot remember where her car was, how she had gotten there, or the color of her car.  Her son and daughter presented there. She was transported to the emergency room by ambulance. On arrival in the emergency room she does not remember her son and daughter being up to beauty salon. She de Public house manager. She's had numbness or weakness of the extremities. She states her right vision seemed "funny". She's does not describe visual field cut or blurring. She also described a vague discomfort in her left anterior neck. No recent fall or injury or trauma. He has never had a stroke or TIA symptoms in the past.  Past Medical History  Diagnosis Date  . Asthma   . Hypothyroidism   . Osteoarthritis   . IBS (irritable bowel syndrome)   . Fibromyalgia   . History of herpes zoster   . HLD (hyperlipidemia)   . Adenomatous colon polyp   . Other B-complex deficiencies   . Osteoarthrosis, unspecified whether generalized or localized, unspecified site   . Embolism and thrombosis of unspecified site   . Disorder of bone and cartilage, unspecified   . Unspecified vitamin D deficiency   . Unspecified urinary incontinence    Past Surgical History  Procedure Laterality Date  . Abdominal hysterectomy  1970    fibroids; endometriosis  . Cervical disc surgery      x 2  . Colonoscopy  2/02    polyps, BE-polyps  . US transvaginal pelvic  modified  10/02    small left ovarian cyst  . Cholecystectomy  9/04  . Bladder tack  5/05  . Cataract extraction  12/13    Dr George Ina   Family History  Problem Relation Age of Onset  . Heart attack Mother   . Stroke Mother     several  . Lupus Brother   . Other Sister     benign brain tumor  . Asthma Daughter   . Other Brother     heart problems  . Colon cancer      1st cousin   History  Substance Use Topics  . Smoking status: Never Smoker   . Smokeless tobacco: Never Used  . Alcohol Use: No     Comment: "is allergic"   OB History   Grav Para Term Preterm Abortions TAB SAB Ect Mult Living                 Review of Systems  Constitutional: Negative for fever, chills, diaphoresis, appetite change and fatigue.  HENT: Negative for mouth sores, sore throat and trouble swallowing.   Eyes: Positive for visual disturbance.  Respiratory: Negative for cough, chest tightness, shortness of breath and wheezing.   Cardiovascular: Negative for chest pain.  Gastrointestinal: Negative for nausea, vomiting, abdominal pain, diarrhea and abdominal distention.  Endocrine: Negative for  polydipsia, polyphagia and polyuria.  Genitourinary: Negative for dysuria, frequency and hematuria.  Musculoskeletal: Negative for gait problem.  Skin: Negative for color change, pallor and rash.  Neurological: Negative for dizziness, syncope, light-headedness and headaches.       Amnesia  Hematological: Does not bruise/bleed easily.  Psychiatric/Behavioral: Negative for behavioral problems and confusion.      Allergies  Alcohol-sulfur; Ergocalciferol; Morphine; Tolterodine tartrate; Myrbetriq; and Nortriptyline hcl  Home Medications   Current Outpatient Rx  Name  Route  Sig  Dispense  Refill  . albuterol (PROVENTIL) 4 MG tablet   Oral   Take 4 mg by mouth 3 (three) times daily.         Marland Kitchen aspirin 325 MG tablet   Oral   Take 325 mg by mouth daily.          . Calcium 1500 MG tablet    Oral   Take 1,500 mg by mouth daily.           . clobetasol (TEMOVATE) 0.05 % external solution   Topical   Apply 1 application topically daily as needed (for psoriasis).         . diazepam (VALIUM) 5 MG tablet   Oral   Take 5 mg by mouth at bedtime.         . diclofenac (FLECTOR) 1.3 % PTCH   Transdermal   Place 1 patch onto the skin as needed (for pain).          Marland Kitchen estradiol (ESTRACE) 0.1 MG/GM vaginal cream   Vaginal   Place 1 Applicatorful vaginally at bedtime.         . fluocinolone (DERMA-SMOOTHE/FS SCALP) 0.01 % external oil   Topical   Apply topically 3 (three) times daily.   120 mL   1   . furosemide (LASIX) 40 MG tablet   Oral   Take 40 mg by mouth daily as needed for edema.         . hydrOXYzine (ATARAX/VISTARIL) 10 MG tablet   Oral   Take 10 mg by mouth every 8 (eight) hours as needed for itching.         . mometasone (ELOCON) 0.1 % ointment   Topical   Apply 1 application topically daily as needed (for psoriasis).         . Multiple Vitamin (MULTIVITAMIN) tablet   Oral   Take 1 tablet by mouth daily.           Marland Kitchen SYNTHROID 88 MCG tablet   Oral   Take 1 tablet (88 mcg total) by mouth daily before breakfast.   30 tablet   11     Dispense as written.   . traMADol (ULTRAM) 50 MG tablet   Oral   Take 1 tablet (50 mg total) by mouth every 8 (eight) hours as needed.   60 tablet   0   . triamcinolone (KENALOG) topical spray   Topical   Apply 1 g topically 2 (two) times daily as needed (for psoriasis).          Marland Kitchen albuterol (PROVENTIL HFA;VENTOLIN HFA) 108 (90 BASE) MCG/ACT inhaler   Inhalation   Inhale 2 puffs into the lungs every 4 (four) hours as needed.            BP 145/74  Pulse 77  Temp(Src) 98 F (36.7 C) (Oral)  Resp 15  SpO2 100% Physical Exam  Constitutional: She is oriented to person, place, and time. She appears well-developed and  well-nourished. No distress.  HENT:  Head: Normocephalic.  Eyes: Conjunctivae  are normal. Pupils are equal, round, and reactive to light. No scleral icterus.  Neck: Normal range of motion. Neck supple. No thyromegaly present.  Cardiovascular: Normal rate and regular rhythm.  Exam reveals no gallop and no friction rub.   No murmur heard. Pulmonary/Chest: Effort normal and breath sounds normal. No respiratory distress. She has no wheezes. She has no rales.  Abdominal: Soft. Bowel sounds are normal. She exhibits no distension. There is no tenderness. There is no rebound.  Musculoskeletal: Normal range of motion.  Neurological: She is alert and oriented to person, place, and time.  Normal symmetric cranial nerve exam. No pronator drift. Normal extremity strength. No carotid bruits. She does not recall driving to the salon. She's not recall being at the salon. She remembers driving to the salon, and exiting shoe murmur she is in an ambulance coming to the emergency room. She can recall her name and address her husbands past medical history and her family history  Skin: Skin is warm and dry. No rash noted.  Psychiatric: She has a normal mood and affect. Her behavior is normal.    ED Course  Procedures (including critical care time) Labs Review Labs Reviewed  BASIC METABOLIC PANEL - Abnormal; Notable for the following:    GFR calc non Af Amer 82 (*)    All other components within normal limits  URINE CULTURE  CBC WITH DIFFERENTIAL  URINALYSIS, ROUTINE W REFLEX MICROSCOPIC   Imaging Review Mr Virgel Paling Wo Contrast  02/20/2014   CLINICAL DATA:  80 year old female with syncopal episode, left side neck pain, right side vision loss. Initial encounter.  EXAM: MRI HEAD WITHOUT AND WITH CONTRAST  MRA HEAD WITHOUT CONTRAST  MRA NECK WITHOUT AND WITH CONTRAST  TECHNIQUE: Multiplanar, multiecho pulse sequences of the brain and surrounding structures were obtained without and with intravenous contrast. Angiographic images of the Circle of Willis were obtained using MRA technique without  intravenous contrast. Angiographic images of the neck were obtained using MRA technique without and with intravenous contrast. Carotid stenosis measurements (when applicable) are obtained utilizing NASCET criteria, using the distal internal carotid diameter as the denominator.  CONTRAST:  6mL MULTIHANCE GADOBENATE DIMEGLUMINE 529 MG/ML IV SOLN  COMPARISON:  None.  FINDINGS: MRI HEAD FINDINGS  Major intracranial vascular flow voids are preserved, the distal left vertebral artery appear dominant.  No restricted diffusion to suggest acute infarction. No midline shift, mass effect, evidence of mass lesion, ventriculomegaly, extra-axial collection or acute intracranial hemorrhage. Cervicomedullary junction and pituitary are within normal limits. Cerebral volume is within normal limits for age. Negative visualized cervical spine. Normal bone marrow signal. Pearline Cables and white matter signal is within normal limits for age throughout the brain. No abnormal enhancement identified.  Postoperative changes to the right globe. Visible internal auditory structures appear normal. Trace inferior right mastoid fluid. Negative visualized nasopharynx. Minor paranasal sinus mucosal thickening. Visualized scalp soft tissues are within normal limits.  MRA HEAD FINDINGS  Antegrade flow in the posterior circulation. Dominant distal left vertebral artery. Normal PICA origins. The distal right vertebral artery functionally terminates in PICA. The left supplies the basilar. Mildly tortuous but otherwise normal basilar artery. Normal SCA and PCA origins. Diminutive posterior communicating arteries. Normal bilateral PCA branches.  Antegrade flow in both ICA siphons. Mild irregularity of the cavernous ICAs compatible with atherosclerosis. No ICA siphon stenosis. Ophthalmic and posterior communicating artery origins are within normal limits. Patent carotid termini.  Normal ACA origins. Mildly dominant right ACA A1 segment. Normal anterior  communicating artery. Prominent median artery of the corpus callosum. Visualized ACA branches are within normal limits. Normal MCA origins. Normal visualized bilateral MCA branches.  MRA NECK FINDINGS  Precontrast time-of-flight images demonstrate antegrade flow in both carotid and vertebral arteries throughout the neck. The left vertebral artery appears mildly dominant. Both carotid bifurcations appear normal.  Post-contrast MRA images reveal a 3 vessel arch configuration with normal great vessel origins.  Normal right CCA. Normal right carotid bifurcation. Normal cervical right ICA.  Normal left CCA. Widely patent left carotid bifurcation. There is mild irregularity and filling defect in the posterior left ICA bulb compatible with atherosclerosis, but nose stenosis results. Otherwise negative cervical left ICA.  No proximal subclavian artery stenosis. Both vertebral artery origins appear normal. The left vertebral artery is dominant throughout the neck. No cervical vertebral artery stenosis identified. The non dominant right vertebral artery appears to functionally terminate in PICA.  IMPRESSION: 1. No acute intracranial abnormality. Normal for age non contrast MRI appearance of the brain. 2. Normal for age MRA head and neck; mild left ICA bulb and bilateral ICA siphon atherosclerosis.   Electronically Signed   By: Lars Pinks M.D.   On: 02/20/2014 18:43   Mr Angiogram Neck W Wo Contrast  02/20/2014   CLINICAL DATA:  80 year old female with syncopal episode, left side neck pain, right side vision loss. Initial encounter.  EXAM: MRI HEAD WITHOUT AND WITH CONTRAST  MRA HEAD WITHOUT CONTRAST  MRA NECK WITHOUT AND WITH CONTRAST  TECHNIQUE: Multiplanar, multiecho pulse sequences of the brain and surrounding structures were obtained without and with intravenous contrast. Angiographic images of the Circle of Willis were obtained using MRA technique without intravenous contrast. Angiographic images of the neck were  obtained using MRA technique without and with intravenous contrast. Carotid stenosis measurements (when applicable) are obtained utilizing NASCET criteria, using the distal internal carotid diameter as the denominator.  CONTRAST:  10mL MULTIHANCE GADOBENATE DIMEGLUMINE 529 MG/ML IV SOLN  COMPARISON:  None.  FINDINGS: MRI HEAD FINDINGS  Major intracranial vascular flow voids are preserved, the distal left vertebral artery appear dominant.  No restricted diffusion to suggest acute infarction. No midline shift, mass effect, evidence of mass lesion, ventriculomegaly, extra-axial collection or acute intracranial hemorrhage. Cervicomedullary junction and pituitary are within normal limits. Cerebral volume is within normal limits for age. Negative visualized cervical spine. Normal bone marrow signal. Pearline Cables and white matter signal is within normal limits for age throughout the brain. No abnormal enhancement identified.  Postoperative changes to the right globe. Visible internal auditory structures appear normal. Trace inferior right mastoid fluid. Negative visualized nasopharynx. Minor paranasal sinus mucosal thickening. Visualized scalp soft tissues are within normal limits.  MRA HEAD FINDINGS  Antegrade flow in the posterior circulation. Dominant distal left vertebral artery. Normal PICA origins. The distal right vertebral artery functionally terminates in PICA. The left supplies the basilar. Mildly tortuous but otherwise normal basilar artery. Normal SCA and PCA origins. Diminutive posterior communicating arteries. Normal bilateral PCA branches.  Antegrade flow in both ICA siphons. Mild irregularity of the cavernous ICAs compatible with atherosclerosis. No ICA siphon stenosis. Ophthalmic and posterior communicating artery origins are within normal limits. Patent carotid termini.  Normal ACA origins. Mildly dominant right ACA A1 segment. Normal anterior communicating artery. Prominent median artery of the corpus callosum.  Visualized ACA branches are within normal limits. Normal MCA origins. Normal visualized bilateral MCA branches.  MRA NECK FINDINGS  Precontrast time-of-flight images demonstrate antegrade flow in both carotid and vertebral arteries throughout the neck. The left vertebral artery appears mildly dominant. Both carotid bifurcations appear normal.  Post-contrast MRA images reveal a 3 vessel arch configuration with normal great vessel origins.  Normal right CCA. Normal right carotid bifurcation. Normal cervical right ICA.  Normal left CCA. Widely patent left carotid bifurcation. There is mild irregularity and filling defect in the posterior left ICA bulb compatible with atherosclerosis, but nose stenosis results. Otherwise negative cervical left ICA.  No proximal subclavian artery stenosis. Both vertebral artery origins appear normal. The left vertebral artery is dominant throughout the neck. No cervical vertebral artery stenosis identified. The non dominant right vertebral artery appears to functionally terminate in PICA.  IMPRESSION: 1. No acute intracranial abnormality. Normal for age non contrast MRI appearance of the brain. 2. Normal for age MRA head and neck; mild left ICA bulb and bilateral ICA siphon atherosclerosis.   Electronically Signed   By: Lars Pinks M.D.   On: 02/20/2014 18:43   Mr Jeri Cos [id number] Contrast  02/20/2014   CLINICAL DATA:  80 year old female with syncopal episode, left side neck pain, right side vision loss. Initial encounter.  EXAM: MRI HEAD WITHOUT AND WITH CONTRAST  MRA HEAD WITHOUT CONTRAST  MRA NECK WITHOUT AND WITH CONTRAST  TECHNIQUE: Multiplanar, multiecho pulse sequences of the brain and surrounding structures were obtained without and with intravenous contrast. Angiographic images of the Circle of Willis were obtained using MRA technique without intravenous contrast. Angiographic images of the neck were obtained using MRA technique without and with intravenous contrast. Carotid stenosis  measurements (when applicable) are obtained utilizing NASCET criteria, using the distal internal carotid diameter as the denominator.  CONTRAST:  63mL MULTIHANCE GADOBENATE DIMEGLUMINE 529 MG/ML IV SOLN  COMPARISON:  None.  FINDINGS: MRI HEAD FINDINGS  Major intracranial vascular flow voids are preserved, the distal left vertebral artery appear dominant.  No restricted diffusion to suggest acute infarction. No midline shift, mass effect, evidence of mass lesion, ventriculomegaly, extra-axial collection or acute intracranial hemorrhage. Cervicomedullary junction and pituitary are within normal limits. Cerebral volume is within normal limits for age. Negative visualized cervical spine. Normal bone marrow signal. Pearline Cables and white matter signal is within normal limits for age throughout the brain. No abnormal enhancement identified.  Postoperative changes to the right globe. Visible internal auditory structures appear normal. Trace inferior right mastoid fluid. Negative visualized nasopharynx. Minor paranasal sinus mucosal thickening. Visualized scalp soft tissues are within normal limits.  MRA HEAD FINDINGS  Antegrade flow in the posterior circulation. Dominant distal left vertebral artery. Normal PICA origins. The distal right vertebral artery functionally terminates in PICA. The left supplies the basilar. Mildly tortuous but otherwise normal basilar artery. Normal SCA and PCA origins. Diminutive posterior communicating arteries. Normal bilateral PCA branches.  Antegrade flow in both ICA siphons. Mild irregularity of the cavernous ICAs compatible with atherosclerosis. No ICA siphon stenosis. Ophthalmic and posterior communicating artery origins are within normal limits. Patent carotid termini.  Normal ACA origins. Mildly dominant right ACA A1 segment. Normal anterior communicating artery. Prominent median artery of the corpus callosum. Visualized ACA branches are within normal limits. Normal MCA origins. Normal  visualized bilateral MCA branches.  MRA NECK FINDINGS  Precontrast time-of-flight images demonstrate antegrade flow in both carotid and vertebral arteries throughout the neck. The left vertebral artery appears mildly dominant. Both carotid bifurcations appear normal.  Post-contrast MRA images reveal a 3 vessel arch configuration with normal great vessel origins.  Normal right CCA. Normal right carotid bifurcation. Normal cervical right ICA.  Normal left CCA. Widely patent left carotid bifurcation. There is mild irregularity and filling defect in the posterior left ICA bulb compatible with atherosclerosis, but nose stenosis results. Otherwise negative cervical left ICA.  No proximal subclavian artery stenosis. Both vertebral artery origins appear normal. The left vertebral artery is dominant throughout the neck. No cervical vertebral artery stenosis identified. The non dominant right vertebral artery appears to functionally terminate in PICA.  IMPRESSION: 1. No acute intracranial abnormality. Normal for age non contrast MRI appearance of the brain. 2. Normal for age MRA head and neck; mild left ICA bulb and bilateral ICA siphon atherosclerosis.   Electronically Signed   By: Lars Pinks M.D.   On: 02/20/2014 18:43     EKG Interpretation None      MDM   Final diagnoses:  CVA (cerebral infarction)  Amnesia (retrograde)    Patient presents from the beauty parlor after having her hair shampooed with an extended into the front sink. She became symptomatic with amnesia while later. She has normal MRI, cranial MRA, and cervical MRA. No dissection.  She has not resolved yet. This may ultimately be transient global amnesia. This time diagnosis is CVA with acute amnesia. I discussed case with Dr.Camilo upon patient's arrival. Placed a call to the hospitalist regarding admission. Patient's mother had a stroke. Patient has never had cardiovascular or cerebrovascular symptoms or events in the past.  In the  differential would be a low flow state from her position. There is similar to description of "beauty parlor strokes". She does not have carotid bruits. She has improved some of her remote memory her memory of today is still absent.    Tanna Furry, MD 02/20/14 [phone]

## 2014-02-20 NOTE — ED Notes (Signed)
Patient presents to ED via EMS from home with family. Family states patient has been confused today since noon.

## 2014-02-20 NOTE — ED Notes (Signed)
Dinner tray ordered for pt per request

## 2014-02-20 NOTE — ED Notes (Signed)
Transport to room nn23

## 2014-02-21 DIAGNOSIS — R413 Other amnesia: Secondary | ICD-10-CM

## 2014-02-21 DIAGNOSIS — G459 Transient cerebral ischemic attack, unspecified: Secondary | ICD-10-CM

## 2014-02-21 LAB — LIPID PANEL
CHOL/HDL RATIO: 3.1 ratio
Cholesterol: 182 mg/dL (ref 0–200)
HDL: 58 mg/dL (ref >39)
LDL Cholesterol: 110 mg/dL — ABNORMAL HIGH (ref 0–99)
Triglycerides: 68 mg/dL (ref <150)
VLDL: 14 mg/dL (ref 0–40)

## 2014-02-21 LAB — URINE CULTURE: Colony Count: 15000

## 2014-02-21 LAB — GLUCOSE, CAPILLARY
Glucose-Capillary: 75 mg/dL (ref 70–99)
Glucose-Capillary: 81 mg/dL (ref 70–99)

## 2014-02-21 LAB — HEMOGLOBIN A1C
HEMOGLOBIN A1C: 5.5 % (ref <5.7)
MEAN PLASMA GLUCOSE: 111 mg/dL (ref <117)

## 2014-02-21 NOTE — Progress Notes (Signed)
VASCULAR LAB PRELIMINARY  PRELIMINARY  PRELIMINARY  PRELIMINARY  Carotid Dopplers completed.    Preliminary report:  1-39% ICA stenosis.  Vertebral artery flow is antegrade.  Eugine Bubb, RVT 02/21/2014, 11:10 AM

## 2014-02-21 NOTE — Progress Notes (Signed)
Utilization review completed.  P.J. Jamyra Zweig,RN,BSN Case Manager 

## 2014-02-21 NOTE — Discharge Summary (Signed)
Physician Discharge Summary  Carla Kelly OJJ:[id number] DOB: July 29, 1933 DOA: 02/20/2014  PCP: Loura Pardon, MD  Admit date: 02/20/2014 Discharge date: 02/21/2014  Time spent: 35 minutes  Recommendations for Outpatient Follow-up:  1. Please followup on patient's neurologic status on hospital follow-up, presented with amnesia which I suspect secondary to transient global amnesia. Amnesia resolving during this hospitalization  Discharge Diagnoses:  Principal Problem:   Transient global amnesia Active Problems:   HYPOTHYROIDISM   HYPOGLYCEMIA   Unspecified vitamin D deficiency   HYPERLIPIDEMIA   ASTHMA   FIBROMYALGIA   Discharge Condition: Stable/improved  Diet recommendation: Heart healthy diet  Filed Weights   02/20/14 2116  Weight: 70.534 kg (155 lb 8 oz)    History of present illness:  Carla Kelly is a 80 y.o. female who was sent to the ED via EMS after she had been confused and unable to recall where her car was or recognize people around her. Her last memory was around 11 AM when she had driven herself to her hair appointment, and afterward she did not remember anything until after 2 pm. Her beautician called her family when she began to behave differently, and her Grand-Daughter advised the beautician to call 911, because something was not right. The patient was evaluated in the ED and underwent an MRI/MRA of the brain which was negative for acute findings. At the time of my interview with the patient her memory before and after the 2 hour gap had returned. She reports having frontal headaches more often in the past 2 months and taking increased Aspirin which does relieve the headache. She also reports having a long history of hypoglycemia, however when EMS arrived her glucose level was found to be 98. She denies ever having any similar episodes.    Hospital Course:  Patient is a pleasant 80 year old female with a past medical history of asthma, fibromyalgia, arthritis, admitted  to the medicine service on 02/12/2014. She was in her usual state health until the day of admission when she developed anterograde amnesia. She could remember being at the beauty parlor, or events that occurred during a four-hour period. Long-term memory was intact however. On presentation she had a nonfocal neurologic examination. She had an MRI of brain on 02/20/2014 which do not reveal acute intracranial Melody. Radiology reporting normal MRI for age. She also had a normal MRA of head and neck. Her symptoms resolved within 24 hours. By the following morning she was functioning at her baseline. This was confirmed by her daughter who was present at bedside. She is awake alert and oriented x3 and had remember being at the beauty [employer] the day before. Patient was discharged in stable condition on 02/21/2014    Discharge Exam: Filed Vitals:   02/21/14 0947  BP: 126/54  Pulse: 80  Temp: 97.6 F (36.4 C)  Resp: 18    General: Patient is no acute distress, awake alert and oriented x3, states feeling much better today Cardiovascular: Regular rate and rhythm normal S1-S2 Respiratory: Lungs are clear to auscultation bilaterally no wheezing rhonchi  Abdomen: Soft nontender nondistended positive bowel sounds Neurologic: Patient having a nonfocal neurologic examination, she is awake alert and oriented x3  Discharge Instructions      Discharge Orders   Future Orders Complete By Expires   Call MD for:  extreme fatigue  As directed    Call MD for:  persistant dizziness or light-headedness  As directed    Call MD for:  severe uncontrolled pain  As directed    Call MD for:  temperature >100.4  As directed    Diet - low sodium heart healthy  As directed    Increase activity slowly  As directed        Medication List         albuterol 4 MG tablet  Commonly known as:  PROVENTIL  Take 4 mg by mouth 3 (three) times daily.     albuterol 108 (90 BASE) MCG/ACT inhaler  Commonly known as:   PROVENTIL HFA;VENTOLIN HFA  Inhale 2 puffs into the lungs every 4 (four) hours as needed.     aspirin 325 MG tablet  Take 325 mg by mouth daily.     Calcium 1500 MG tablet  Take 1,500 mg by mouth daily.     clobetasol 0.05 % external solution  Commonly known as:  TEMOVATE  Apply 1 application topically daily as needed (for psoriasis).     diazepam 5 MG tablet  Commonly known as:  VALIUM  Take 5 mg by mouth at bedtime.     diclofenac 1.3 % Ptch  Commonly known as:  FLECTOR  Place 1 patch onto the skin as needed (for pain).     estradiol 0.1 MG/GM vaginal cream  Commonly known as:  ESTRACE  Place 1 Applicatorful vaginally at bedtime.     fluocinolone 0.01 % external oil  Commonly known as:  DERMA-SMOOTHE/FS SCALP  Apply topically 3 (three) times daily.     furosemide 40 MG tablet  Commonly known as:  LASIX  Take 40 mg by mouth daily as needed for edema.     hydrOXYzine 10 MG tablet  Commonly known as:  ATARAX/VISTARIL  Take 10 mg by mouth every 8 (eight) hours as needed for itching.     mometasone 0.1 % ointment  Commonly known as:  ELOCON  Apply 1 application topically daily as needed (for psoriasis).     multivitamin tablet  Take 1 tablet by mouth daily.     SYNTHROID 88 MCG tablet  Generic drug:  levothyroxine  Take 1 tablet (88 mcg total) by mouth daily before breakfast.     traMADol 50 MG tablet  Commonly known as:  ULTRAM  Take 1 tablet (50 mg total) by mouth every 8 (eight) hours as needed.     triamcinolone topical spray  Commonly known as:  KENALOG  Apply 1 g topically 2 (two) times daily as needed (for psoriasis).       Allergies  Allergen Reactions  . Alcohol-Sulfur [Sulfur] Shortness Of Breath and Swelling  . Ergocalciferol Other (See Comments)    REACTION: GI side effects  . Morphine Nausea And Vomiting  . Tolterodine Tartrate Other (See Comments)    REACTION: ? reaction  . Myrbetriq [Mirabegron] Other (See Comments)    unknown  .  Nortriptyline Hcl Other (See Comments)    unknown   Follow-up Information   Follow up with Loura Pardon, MD In 2 weeks.   Specialties:  Family Medicine, Radiology   Contact information:   [address] [place] [address] [phone]        The results of significant diagnostics from this hospitalization (including imaging, microbiology, ancillary and laboratory) are listed below for reference.    Significant Diagnostic Studies: Mr Virgel Paling Wo Contrast  02/20/2014   CLINICAL DATA:  80 year old female with syncopal episode, left side neck pain, right side vision loss. Initial encounter.  EXAM: MRI HEAD WITHOUT  AND WITH CONTRAST  MRA HEAD WITHOUT CONTRAST  MRA NECK WITHOUT AND WITH CONTRAST  TECHNIQUE: Multiplanar, multiecho pulse sequences of the brain and surrounding structures were obtained without and with intravenous contrast. Angiographic images of the Circle of Willis were obtained using MRA technique without intravenous contrast. Angiographic images of the neck were obtained using MRA technique without and with intravenous contrast. Carotid stenosis measurements (when applicable) are obtained utilizing NASCET criteria, using the distal internal carotid diameter as the denominator.  CONTRAST:  6mL MULTIHANCE GADOBENATE DIMEGLUMINE 529 MG/ML IV SOLN  COMPARISON:  None.  FINDINGS: MRI HEAD FINDINGS  Major intracranial vascular flow voids are preserved, the distal left vertebral artery appear dominant.  No restricted diffusion to suggest acute infarction. No midline shift, mass effect, evidence of mass lesion, ventriculomegaly, extra-axial collection or acute intracranial hemorrhage. Cervicomedullary junction and pituitary are within normal limits. Cerebral volume is within normal limits for age. Negative visualized cervical spine. Normal bone marrow signal. Pearline Cables and white matter signal is within normal limits for age throughout the brain. No abnormal  enhancement identified.  Postoperative changes to the right globe. Visible internal auditory structures appear normal. Trace inferior right mastoid fluid. Negative visualized nasopharynx. Minor paranasal sinus mucosal thickening. Visualized scalp soft tissues are within normal limits.  MRA HEAD FINDINGS  Antegrade flow in the posterior circulation. Dominant distal left vertebral artery. Normal PICA origins. The distal right vertebral artery functionally terminates in PICA. The left supplies the basilar. Mildly tortuous but otherwise normal basilar artery. Normal SCA and PCA origins. Diminutive posterior communicating arteries. Normal bilateral PCA branches.  Antegrade flow in both ICA siphons. Mild irregularity of the cavernous ICAs compatible with atherosclerosis. No ICA siphon stenosis. Ophthalmic and posterior communicating artery origins are within normal limits. Patent carotid termini.  Normal ACA origins. Mildly dominant right ACA A1 segment. Normal anterior communicating artery. Prominent median artery of the corpus callosum. Visualized ACA branches are within normal limits. Normal MCA origins. Normal visualized bilateral MCA branches.  MRA NECK FINDINGS  Precontrast time-of-flight images demonstrate antegrade flow in both carotid and vertebral arteries throughout the neck. The left vertebral artery appears mildly dominant. Both carotid bifurcations appear normal.  Post-contrast MRA images reveal a 3 vessel arch configuration with normal great vessel origins.  Normal right CCA. Normal right carotid bifurcation. Normal cervical right ICA.  Normal left CCA. Widely patent left carotid bifurcation. There is mild irregularity and filling defect in the posterior left ICA bulb compatible with atherosclerosis, but nose stenosis results. Otherwise negative cervical left ICA.  No proximal subclavian artery stenosis. Both vertebral artery origins appear normal. The left vertebral artery is dominant throughout the neck.  No cervical vertebral artery stenosis identified. The non dominant right vertebral artery appears to functionally terminate in PICA.  IMPRESSION: 1. No acute intracranial abnormality. Normal for age non contrast MRI appearance of the brain. 2. Normal for age MRA head and neck; mild left ICA bulb and bilateral ICA siphon atherosclerosis.   Electronically Signed   By: Lars Pinks M.D.   On: 02/20/2014 18:43   Mr Angiogram Neck W Wo Contrast  02/20/2014   CLINICAL DATA:  80 year old female with syncopal episode, left side neck pain, right side vision loss. Initial encounter.  EXAM: MRI HEAD WITHOUT AND WITH CONTRAST  MRA HEAD WITHOUT CONTRAST  MRA NECK WITHOUT AND WITH CONTRAST  TECHNIQUE: Multiplanar, multiecho pulse sequences of the brain and surrounding structures were obtained without and with intravenous contrast. Angiographic images of the Circle of Willis  were obtained using MRA technique without intravenous contrast. Angiographic images of the neck were obtained using MRA technique without and with intravenous contrast. Carotid stenosis measurements (when applicable) are obtained utilizing NASCET criteria, using the distal internal carotid diameter as the denominator.  CONTRAST:  65mL MULTIHANCE GADOBENATE DIMEGLUMINE 529 MG/ML IV SOLN  COMPARISON:  None.  FINDINGS: MRI HEAD FINDINGS  Major intracranial vascular flow voids are preserved, the distal left vertebral artery appear dominant.  No restricted diffusion to suggest acute infarction. No midline shift, mass effect, evidence of mass lesion, ventriculomegaly, extra-axial collection or acute intracranial hemorrhage. Cervicomedullary junction and pituitary are within normal limits. Cerebral volume is within normal limits for age. Negative visualized cervical spine. Normal bone marrow signal. Pear[place] Cables and white matter signal is within normal limits for age throughout the brain. No abnormal enhancement identified.  Postoperative changes to the right globe. Visible  internal auditory structures appear normal. Trace inferior right mastoid fluid. Negative visualized nasopharynx. Minor paranasal sinus mucosal thickening. Visualized scalp soft tissues are within normal limits.  MRA HEAD FINDINGS  Antegrade flow in the posterior circulation. Dominant distal left vertebral artery. Normal PICA origins. The distal right vertebral artery functionally terminates in PICA. The left supplies the basilar. Mildly tortuous but otherwise normal basilar artery. Normal SCA and PCA origins. Diminutive posterior communicating arteries. Normal bilateral PCA branches.  Antegrade flow in both ICA siphons. Mild irregularity of the cavernous ICAs compatible with atherosclerosis. No ICA siphon stenosis. Ophthalmic and posterior communicating artery origins are within normal limits. Patent carotid termini.  Normal ACA origins. Mildly dominant right ACA A1 segment. Normal anterior communicating artery. Prominent median artery of the corpus callosum. Visualized ACA branches are within normal limits. Normal MCA origins. Normal visualized bilateral MCA branches.  MRA NECK FINDINGS  Precontrast time-of-flight images demonstrate antegrade flow in both carotid and vertebral arteries throughout the neck. The left vertebral artery appears mildly dominant. Both carotid bifurcations appear normal.  Post-contrast MRA images reveal a 3 vessel arch configuration with normal great vessel origins.  Normal right CCA. Normal right carotid bifurcation. Normal cervical right ICA.  Normal left CCA. Widely patent left carotid bifurcation. There is mild irregularity and filling defect in the posterior left ICA bulb compatible with atherosclerosis, but nose stenosis results. Otherwise negative cervical left ICA.  No proximal subclavian artery stenosis. Both vertebral artery origins appear normal. The left vertebral artery is dominant throughout the neck. No cervical vertebral artery stenosis identified. The non dominant right  vertebral artery appears to functionally terminate in PICA.  IMPRESSION: 1. No acute intracranial abnormality. Normal for age non contrast MRI appearance of the brain. 2. Normal for age MRA head and neck; mild left ICA bulb and bilateral ICA siphon atherosclerosis.   Electronically Signed   By: Lars Pinks M.D.   On: 02/20/2014 18:43   Mr Jeri Cos RS Contrast  02/20/2014   CLINICAL DATA:  80 year old female with syncopal episode, left side neck pain, right side vision loss. Initial encounter.  EXAM: MRI HEAD WITHOUT AND WITH CONTRAST  MRA HEAD WITHOUT CONTRAST  MRA NECK WITHOUT AND WITH CONTRAST  TECHNIQUE: Multiplanar, multiecho pulse sequences of the brain and surrounding structures were obtained without and with intravenous contrast. Angiographic images of the Circle of Willis were obtained using MRA technique without intravenous contrast. Angiographic images of the neck were obtained using MRA technique without and with intravenous contrast. Carotid stenosis measurements (when applicable) are obtained utilizing NASCET criteria, using the distal internal carotid diameter as the denominator.  CONTRAST:  22mL MULTIHANCE GADOBENATE DIMEGLUMINE 529 MG/ML IV SOLN  COMPARISON:  None.  FINDINGS: MRI HEAD FINDINGS  Major intracranial vascular flow voids are preserved, the distal left vertebral artery appear dominant.  No restricted diffusion to suggest acute infarction. No midline shift, mass effect, evidence of mass lesion, ventriculomegaly, extra-axial collection or acute intracranial hemorrhage. Cervicomedullary junction and pituitary are within normal limits. Cerebral volume is within normal limits for age. Negative visualized cervical spine. Normal bone marrow signal. Pearline Cables and white matter signal is within normal limits for age throughout the brain. No abnormal enhancement identified.  Postoperative changes to the right globe. Visible internal auditory structures appear normal. Trace inferior right mastoid fluid.  Negative visualized nasopharynx. Minor paranasal sinus mucosal thickening. Visualized scalp soft tissues are within normal limits.  MRA HEAD FINDINGS  Antegrade flow in the posterior circulation. Dominant distal left vertebral artery. Normal PICA origins. The distal right vertebral artery functionally terminates in PICA. The left supplies the basilar. Mildly tortuous but otherwise normal basilar artery. Normal SCA and PCA origins. Diminutive posterior communicating arteries. Normal bilateral PCA branches.  Antegrade flow in both ICA siphons. Mild irregularity of the cavernous ICAs compatible with atherosclerosis. No ICA siphon stenosis. Ophthalmic and posterior communicating artery origins are within normal limits. Patent carotid termini.  Normal ACA origins. Mildly dominant right ACA A1 segment. Normal anterior communicating artery. Prominent median artery of the corpus callosum. Visualized ACA branches are within normal limits. Normal MCA origins. Normal visualized bilateral MCA branches.  MRA NECK FINDINGS  Precontrast time-of-flight images demonstrate antegrade flow in both carotid and vertebral arteries throughout the neck. The left vertebral artery appears mildly dominant. Both carotid bifurcations appear normal.  Post-contrast MRA images reveal a 3 vessel arch configuration with normal great vessel origins.  Normal right CCA. Normal right carotid bifurcation. Normal cervical right ICA.  Normal left CCA. Widely patent left carotid bifurcation. There is mild irregularity and filling defect in the posterior left ICA bulb compatible with atherosclerosis, but nose stenosis results. Otherwise negative cervical left ICA.  No proximal subclavian artery stenosis. Both vertebral artery origins appear normal. The left vertebral artery is dominant throughout the neck. No cervical vertebral artery stenosis identified. The non dominant right vertebral artery appears to functionally terminate in PICA.  IMPRESSION: 1. No  acute intracranial abnormality. Normal for age non contrast MRI appearance of the brain. 2. Normal for age MRA head and neck; mild left ICA bulb and bilateral ICA siphon atherosclerosis.   Electronically Signed   By: Lars Pinks M.D.   On: 02/20/2014 18:43    Microbiology: No results found for this or any previous visit (from the past 240 hour(s)).   Labs: Basic Metabolic Panel:  Recent Labs Lab 02/20/14 1605  NA 139  K 4.0  CL 101  CO2 26  GLUCOSE 82  BUN 14  CREATININE 0.66  CALCIUM 9.4   Liver Function Tests: No results found for this basename: AST, ALT, ALKPHOS, BILITOT, PROT, ALBUMIN,  in the last 168 hours No results found for this basename: LIPASE, AMYLASE,  in the last 168 hours No results found for this basename: AMMONIA,  in the last 168 hours CBC:  Recent Labs Lab 02/20/14 1605  WBC 5.0  NEUTROABS 2.3  HGB 13.3  HCT 37.9  MCV 90.2  PLT 159   Cardiac Enzymes: No results found for this basename: CKTOTAL, CKMB, CKMBINDEX, TROPONINI,  in the last 168 hours BNP: BNP (last 3 results) No results found for this basename:  PROBNP,  in the last 8760 hours CBG:  Recent Labs Lab 02/20/14 2357 02/21/14 0642 02/21/14 0756  GLUCAP 98 75 81       Signed:  Jeymi Hepp  [facility] 02/21/2014, 12:04 PM

## 2014-02-22 LAB — GLUCOSE, CAPILLARY: Glucose-Capillary: 77 mg/dL (ref 70–99)

## 2014-02-24 ENCOUNTER — Ambulatory Visit (INDEPENDENT_AMBULATORY_CARE_PROVIDER_SITE_OTHER): Payer: Medicare Other | Admitting: Family Medicine

## 2014-02-24 ENCOUNTER — Encounter: Payer: Self-pay | Admitting: Family Medicine

## 2014-02-24 VITALS — BP 120/74 | HR 89 | Temp 98.1°F | Ht 66.75 in | Wt 155.5 lb

## 2014-02-24 DIAGNOSIS — G454 Transient global amnesia: Secondary | ICD-10-CM

## 2014-02-24 NOTE — Progress Notes (Signed)
Subjective:    Patient ID: Carla Kelly, female    DOB: 08/10/1933, 80 y.o.   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 If consistent with head position/ head in sink/ tight cape or permanent chemicals that day    Dx with transient global amnesia  MRI brain nl  MRA head and neck neg  No CVa found or persistent symptoms / no physical symptoms  Her symptoms resolved within several hours  Labs ok -no hypoglycemia - and nl blood sugar throughout the hosp stay Lab Results  Component Value Date   WBC 5.0 02/20/2014   HGB 13.3 02/20/2014   HCT 37.9 02/20/2014   MCV 90.2 02/20/2014   PLT 159 02/20/2014      Chemistry      Component Value Date/Time   NA 139 02/20/2014 1605   K 4.0 02/20/2014 1605   CL 101 02/20/2014 1605   CO2 26 02/20/2014 1605   BUN 14 02/20/2014 1605   CREATININE 0.66 02/20/2014 1605      Component Value Date/Time   CALCIUM 9.4 02/20/2014 1605   ALKPHOS 63 12/08/2013 0751   AST 17 12/08/2013 0751   ALT 13 12/08/2013 0751   BILITOT 0.4 12/08/2013 0751       Review of Systems  Constitutional: Negative for fever, appetite change, fatigue and unexpected weight change.  Eyes: Negative for pain and visual disturbance.  Respiratory: Negative for cough and shortness of breath.   Cardiovascular: Negative for cp or palpitations    Gastrointestinal: Negative for nausea, diarrhea and constipation.  Genitourinary: Negative for urgency and frequency.  Skin: Negative for pallor or rash   Neurological: Negative for weakness, light-headedness, numbness and headaches.  Hematological: Negative for adenopathy. Does not bruise/bleed easily.  Psychiatric/Behavioral: Negative for dysphoric mood. The patient is  not nervous/anxious.       Review of Systems     Objective:   Physical Exam  Constitutional: She appears well-developed and well-nourished. No distress.  HENT:  Head: Normocephalic and atraumatic.  Right Ear: External ear normal.  Left Ear: External ear normal.  Nose: Nose normal.  Mouth/Throat: Oropharynx is clear and moist.  Eyes: Conjunctivae and EOM are normal. Pupils are equal, round, and reactive to light. Right eye exhibits no discharge. Left eye exhibits no discharge. No scleral icterus.  Neck: Normal range of motion. Neck supple. No JVD present. No thyromegaly present.  Cardiovascular: Normal rate, regular rhythm, normal heart sounds and intact distal pulses.  Exam reveals no gallop.   Pulmonary/Chest: Effort normal and breath sounds normal. No respiratory distress. She has no wheezes. She has no rales.  Abdominal: Soft. Bowel sounds are normal. She exhibits no distension and no mass. There is no tenderness.  Musculoskeletal: She exhibits no edema and no tenderness.  Lymphadenopathy:    She has no cervical adenopathy.  Neurological: She is alert. She has normal reflexes. She displays no atrophy and no tremor. No cranial nerve deficit or sensory deficit. She exhibits normal muscle tone. Coordination and gait normal.  No cerebellar signs   Skin: Skin is warm and dry. No rash noted. No erythema. No pallor.  Psychiatric: She has a  normal mood and affect. Her speech is normal and behavior is normal. Judgment and thought content normal. Cognition and memory are normal. Cognition and memory are not impaired.  Nl affect-back to her normal self and very mentally sharp          Assessment & Plan:

## 2014-02-24 NOTE — Assessment & Plan Note (Signed)
Rev hosp records and studies in detail Reassuring work up Disc TGA and given handout on it  If any recurrent symptoms -pt aware to call 911 and alert [place]  Nl exam now-I feel she is safe to drive She will continue asa 325 mg daily as long as she tolerates it

## 2014-02-24 NOTE — Patient Instructions (Signed)
I'm glad you are feeling better  Your hospital records indicate transient global amnesia  Let's watch you closely for any memory problems or weakness/numbness/ speech problems Call 911 for any new episodes  I feel you are safe to drive at this time

## 2014-02-24 NOTE — Progress Notes (Signed)
Pre visit review using our clinic review tool, if applicable. No additional management support is needed unless otherwise documented below in the visit note. 

## 2014-04-09 ENCOUNTER — Other Ambulatory Visit: Payer: Self-pay | Admitting: Family Medicine

## 2014-04-09 NOTE — Telephone Encounter (Signed)
Px written for call in   

## 2014-04-09 NOTE — Telephone Encounter (Signed)
Electronic refill request, please advise  

## 2014-04-09 NOTE — Telephone Encounter (Signed)
Rx called in as prescribed 

## 2014-06-10 ENCOUNTER — Other Ambulatory Visit: Payer: Self-pay | Admitting: *Deleted

## 2014-06-10 MED ORDER — SYNTHROID 88 MCG PO TABS: 88.0000 ug | ORAL_TABLET | Freq: Every day | ORAL | Status: DC

## 2014-06-10 NOTE — Telephone Encounter (Signed)
Pt request 90 day supply, done

## 2014-06-15 ENCOUNTER — Other Ambulatory Visit: Payer: Self-pay | Admitting: Family Medicine

## 2014-06-15 NOTE — Telephone Encounter (Signed)
Px written for call in   

## 2014-06-15 NOTE — Telephone Encounter (Signed)
Electronic refill request, please advise  

## 2014-06-15 NOTE — Telephone Encounter (Signed)
Rx called in as prescribed 

## 2014-07-26 ENCOUNTER — Other Ambulatory Visit: Payer: Self-pay | Admitting: Family Medicine

## 2014-07-26 NOTE — Telephone Encounter (Signed)
Electronic refill request, please advise  

## 2014-07-26 NOTE — Telephone Encounter (Signed)
Px written for call in   

## 2014-07-26 NOTE — Telephone Encounter (Signed)
Rx called in as prescribed 

## 2014-08-04 ENCOUNTER — Other Ambulatory Visit (HOSPITAL_COMMUNITY): Payer: Self-pay | Admitting: Orthopaedic Surgery

## 2014-08-04 ENCOUNTER — Encounter (HOSPITAL_COMMUNITY): Payer: Self-pay | Admitting: Pharmacy Technician

## 2014-08-04 NOTE — Progress Notes (Signed)
Please put orders in Epic surgery 08-13-14 pre op 08-06-14 Thanks

## 2014-08-06 ENCOUNTER — Ambulatory Visit (HOSPITAL_COMMUNITY)
Admission: RE | Admit: 2014-08-06 | Discharge: 2014-08-06 | Disposition: A | Discharge status: Home / Self Care | Payer: Medicare Other | Source: Ambulatory Visit | Attending: Anesthesiology | Admitting: Anesthesiology

## 2014-08-06 ENCOUNTER — Encounter (HOSPITAL_COMMUNITY)
Admission: RE | Admit: 2014-08-06 | Discharge: 2014-08-06 | Disposition: A | Discharge status: Home / Self Care | Payer: Medicare Other | Source: Ambulatory Visit | Attending: Orthopaedic Surgery | Admitting: Orthopaedic Surgery

## 2014-08-06 ENCOUNTER — Encounter (HOSPITAL_COMMUNITY): Payer: Self-pay

## 2014-08-06 DIAGNOSIS — M1710 Unilateral primary osteoarthritis, unspecified knee: Secondary | ICD-10-CM | POA: Insufficient documentation

## 2014-08-06 DIAGNOSIS — Z01818 Encounter for other preprocedural examination: Secondary | ICD-10-CM | POA: Insufficient documentation

## 2014-08-06 DIAGNOSIS — IMO0002 Reserved for concepts with insufficient information to code with codable children: Secondary | ICD-10-CM

## 2014-08-06 HISTORY — DX: Rheumatic mitral valve disease, unspecified: I05.9

## 2014-08-06 HISTORY — DX: Psoriasis, unspecified: L40.9

## 2014-08-06 HISTORY — DX: Hypoglycemia, unspecified: E16.2

## 2014-08-06 HISTORY — DX: Other specified soft tissue disorders: M79.89

## 2014-08-06 HISTORY — DX: Transient global amnesia: G45.4

## 2014-08-06 LAB — CBC
HEMATOCRIT: 39.5 % (ref 36.0–46.0)
Hemoglobin: 13.2 g/dL (ref 12.0–15.0)
MCH: 30.3 pg (ref 26.0–34.0)
MCHC: 33.4 g/dL (ref 30.0–36.0)
MCV: 90.6 fL (ref 78.0–100.0)
PLATELETS: 173 10*3/uL (ref 150–400)
RBC: 4.36 MIL/uL (ref 3.87–5.11)
RDW: 12.4 % (ref 11.5–15.5)
WBC: 5 10*3/uL (ref 4.0–10.5)

## 2014-08-06 LAB — BASIC METABOLIC PANEL
Anion gap: 12 (ref 5–15)
BUN: 17 mg/dL (ref 6–23)
CO2: 25 mEq/L (ref 19–32)
Calcium: 9.5 mg/dL (ref 8.4–10.5)
Chloride: 103 mEq/L (ref 96–112)
Creatinine, Ser: 0.67 mg/dL (ref 0.50–1.10)
GFR calc Af Amer: >90 mL/min (ref >90)
GFR, EST NON AFRICAN AMERICAN: 81 mL/min — AB (ref >90)
GLUCOSE: 85 mg/dL (ref 70–99)
POTASSIUM: 4.3 meq/L (ref 3.7–5.3)
Sodium: 140 mEq/L (ref 137–147)

## 2014-08-06 LAB — SURGICAL PCR SCREEN
MRSA, PCR: NEGATIVE
Staphylococcus aureus: NEGATIVE

## 2014-08-06 LAB — URINALYSIS, ROUTINE W REFLEX MICROSCOPIC
BILIRUBIN URINE: NEGATIVE
GLUCOSE, UA: NEGATIVE mg/dL
HGB URINE DIPSTICK: NEGATIVE
KETONES UR: NEGATIVE mg/dL
Leukocytes, UA: NEGATIVE
Nitrite: NEGATIVE
PH: 7.5 (ref 5.0–8.0)
Protein, ur: NEGATIVE mg/dL
SPECIFIC GRAVITY, URINE: 1.013 (ref 1.005–1.030)
Urobilinogen, UA: 0.2 mg/dL (ref 0.0–1.0)

## 2014-08-06 LAB — APTT: aPTT: 32 seconds (ref 24–37)

## 2014-08-06 LAB — PROTIME-INR
INR: 1.02 (ref 0.00–1.49)
PROTHROMBIN TIME: 13.4 s (ref 11.6–15.2)

## 2014-08-06 NOTE — Progress Notes (Signed)
EKG 02/20/14 on EPIC

## 2014-08-06 NOTE — Patient Instructions (Addendum)
22 Carla Kelly  08/06/2014   Your procedure is scheduled on: Friday 08/13/14  Report to [facility] at 05:30 AM.  Call this number if you have problems the morning of surgery [phone]: [phone]   Remember: Please bring inhaler on day of surgery.    Do not eat food or drink liquids After Midnight.     Take these medicines the morning of surgery with A SIP OF WATER: synthroid, valium if needed   Do not wear jewelry, make-up or nail polish.  Do not wear lotions, powders, or perfumes. You may wear deodorant.  Do not shave 48 hours prior to surgery. Men may shave face and neck.  Do not bring valuables to the hospital.  Contacts, dentures or bridgework may not be worn into surgery.  Leave suitcase in the car. After surgery it may be brought to your room.  For patients admitted to the hospital, checkout time is 11:00 AM the day of discharge.    Please read over the following fact sheets that you were given: MRSA Information  Paulette Blanch, RN  pre op nurse call if needed [phone]    [facility] - Preparing for Surgery Before surgery, you can play an important role.  Because skin is not sterile, your skin needs to be as free of germs as possible.  You can reduce the number of germs on your skin by washing with CHG (chlorahexidine gluconate) soap before surgery.  CHG is an antiseptic cleaner which kills germs and bonds with the skin to continue killing germs even after washing. Please DO NOT use if you have an allergy to CHG or antibacterial soaps.  If your skin becomes reddened/irritated stop using the CHG and inform your nurse when you arrive at Short Stay. Do not shave (including legs and underarms) for at least 48 hours prior to the first CHG shower.  You may shave your face/neck. Please follow these instructions carefully:  1.  Shower with CHG Soap the night before surgery and the  morning of Surgery.  2.  If you choose to wash your hair, wash your hair first as usual  with your  normal  shampoo.  3.  After you shampoo, rinse your hair and body thoroughly to remove the  shampoo.                            4.  Use CHG as you would any other liquid soap.  You can apply chg directly  to the skin and wash                       Gently with a scrungie or clean washcloth.  5.  Apply the CHG Soap to your body ONLY FROM THE NECK DOWN.   Do not use on face/ open                           Wound or open sores. Avoid contact with eyes, ears mouth and genitals (private parts).                       Wash face,  Genitals (private parts) with your normal soap.             6.  Wash thoroughly, paying special attention to the area where your surgery  will be performed.  7.  Thoroughly rinse  your body with warm water from the neck down.  8.  DO NOT shower/wash with your normal soap after using and rinsing off  the CHG Soap.                9.  Pat yourself dry with a clean towel.            10.  Wear clean pajamas.            11.  Place clean sheets on your bed the night of your first shower and do not  sleep with pets. Day of Surgery : Do not apply any lotions the morning of surgery.  Please wear clean clothes to the hospital/surgery center.  FAILURE TO FOLLOW THESE INSTRUCTIONS MAY RESULT IN THE CANCELLATION OF YOUR SURGERY PATIENT SIGNATURE_________________________________  NURSE SIGNATURE__________________________________  ________________________________________________________________________  WHAT IS A BLOOD TRANSFUSION? Blood Transfusion Information  A transfusion is the replacement of blood or some of its parts. Blood is made up of multiple cells which provide different functions.  Red blood cells carry oxygen and are used for blood loss replacement.  White blood cells fight against infection.  Platelets control bleeding.  Plasma helps clot blood.  Other blood products are available for specialized needs, such as hemophilia or other clotting  disorders. BEFORE THE TRANSFUSION  Who gives blood for transfusions?   Healthy volunteers who are fully evaluated to make sure their blood is safe. This is blood bank blood. Transfusion therapy is the safest it has ever been in the practice of medicine. Before blood is taken from a donor, a complete history is taken to make sure that person has no history of diseases nor engages in risky social behavior (examples are intravenous drug use or sexual activity with multiple partners). The donor's travel history is screened to minimize risk of transmitting infections, such as malaria. The donated blood is tested for signs of infectious diseases, such as HIV and hepatitis. The blood is then tested to be sure it is compatible with you in order to minimize the chance of a transfusion reaction. If you or a relative donates blood, this is often done in anticipation of surgery and is not appropriate for emergency situations. It takes many days to process the donated blood. RISKS AND COMPLICATIONS Although transfusion therapy is very safe and saves many lives, the main dangers of transfusion include:   Getting an infectious disease.  Developing a transfusion reaction. This is an allergic reaction to something in the blood you were given. Every precaution is taken to prevent this. The decision to have a blood transfusion has been considered carefully by your caregiver before blood is given. Blood is not given unless the benefits outweigh the risks. AFTER THE TRANSFUSION  Right after receiving a blood transfusion, you will usually feel much better and more energetic. This is especially true if your red blood cells have gotten low (anemic). The transfusion raises the level of the red blood cells which carry oxygen, and this usually causes an energy increase.  The nurse administering the transfusion will monitor you carefully for complications. HOME CARE INSTRUCTIONS  No special instructions are needed after a  transfusion. You may find your energy is better. Speak with your caregiver about any limitations on activity for underlying diseases you may have. SEEK MEDICAL CARE IF:   Your condition is not improving after your transfusion.  You develop redness or irritation at the intravenous (IV) site. SEEK IMMEDIATE MEDICAL CARE IF:  Any of the following  symptoms occur over the next 12 hours:  Shaking chills.  You have a temperature by mouth above 102 F (38.9 C), not controlled by medicine.  Chest, back, or muscle pain.  People around you feel you are not acting correctly or are confused.  Shortness of breath or difficulty breathing.  Dizziness and fainting.  You get a rash or develop hives.  You have a decrease in urine output.  Your urine turns a dark color or changes to pink, red, or brown. Any of the following symptoms occur over the next 10 days:  You have a temperature by mouth above 102 F (38.9 C), not controlled by medicine.  Shortness of breath.  Weakness after normal activity.  The white part of the eye turns yellow (jaundice).  You have a decrease in the amount of urine or are urinating less often.  Your urine turns a dark color or changes to pink, red, or brown. Document Released: 11/09/2000 Document Revised: 02/04/2012 Document Reviewed: 06/28/2008 [facility] Patient Information 2014 [place], [place].  _______________________________________________________________________

## 2014-08-09 ENCOUNTER — Telehealth: Payer: Self-pay | Admitting: Family Medicine

## 2014-08-09 NOTE — Telephone Encounter (Signed)
Done and in IN box 

## 2014-08-09 NOTE — Telephone Encounter (Signed)
Pt brought in parking placard to be filled out. Would like it back asap :) Put in inbox

## 2014-08-09 NOTE — Telephone Encounter (Signed)
Form in your inbox 

## 2014-08-10 NOTE — Telephone Encounter (Signed)
Daughter notified form ready for pick up 

## 2014-08-13 ENCOUNTER — Encounter (HOSPITAL_COMMUNITY): Payer: Medicare Other | Admitting: Anesthesiology

## 2014-08-13 ENCOUNTER — Inpatient Hospital Stay (HOSPITAL_COMMUNITY)
Admission: RE | Admit: 2014-08-13 | Discharge: 2014-08-16 | DRG: 470 | Disposition: A | Discharge status: Skilled Nursing Facility | Payer: Medicare Other | Source: Ambulatory Visit | Attending: Orthopaedic Surgery | Admitting: Orthopaedic Surgery

## 2014-08-13 ENCOUNTER — Inpatient Hospital Stay (HOSPITAL_COMMUNITY): Payer: Medicare Other

## 2014-08-13 ENCOUNTER — Inpatient Hospital Stay (HOSPITAL_COMMUNITY): Payer: Medicare Other | Admitting: Anesthesiology

## 2014-08-13 ENCOUNTER — Encounter (HOSPITAL_COMMUNITY)
Admission: RE | Disposition: A | Discharge status: Skilled Nursing Facility | Payer: Self-pay | Source: Ambulatory Visit | Attending: Orthopaedic Surgery

## 2014-08-13 ENCOUNTER — Encounter (HOSPITAL_COMMUNITY): Payer: Self-pay | Admitting: *Deleted

## 2014-08-13 DIAGNOSIS — E785 Hyperlipidemia, unspecified: Secondary | ICD-10-CM | POA: Diagnosis present

## 2014-08-13 DIAGNOSIS — Z888 Allergy status to other drugs, medicaments and biological substances status: Secondary | ICD-10-CM | POA: Diagnosis not present

## 2014-08-13 DIAGNOSIS — K589 Irritable bowel syndrome without diarrhea: Secondary | ICD-10-CM | POA: Diagnosis present

## 2014-08-13 DIAGNOSIS — Z79899 Other long term (current) drug therapy: Secondary | ICD-10-CM | POA: Diagnosis not present

## 2014-08-13 DIAGNOSIS — M1710 Unilateral primary osteoarthritis, unspecified knee: Secondary | ICD-10-CM | POA: Diagnosis present

## 2014-08-13 DIAGNOSIS — Z800 Family history of malignant neoplasm of digestive organs: Secondary | ICD-10-CM | POA: Diagnosis not present

## 2014-08-13 DIAGNOSIS — Z825 Family history of asthma and other chronic lower respiratory diseases: Secondary | ICD-10-CM | POA: Diagnosis not present

## 2014-08-13 DIAGNOSIS — M25469 Effusion, unspecified knee: Secondary | ICD-10-CM | POA: Diagnosis present

## 2014-08-13 DIAGNOSIS — J45909 Unspecified asthma, uncomplicated: Secondary | ICD-10-CM | POA: Diagnosis present

## 2014-08-13 DIAGNOSIS — M899 Disorder of bone, unspecified: Secondary | ICD-10-CM | POA: Diagnosis present

## 2014-08-13 DIAGNOSIS — Z86718 Personal history of other venous thrombosis and embolism: Secondary | ICD-10-CM | POA: Diagnosis not present

## 2014-08-13 DIAGNOSIS — IMO0001 Reserved for inherently not codable concepts without codable children: Secondary | ICD-10-CM | POA: Diagnosis present

## 2014-08-13 DIAGNOSIS — M949 Disorder of cartilage, unspecified: Secondary | ICD-10-CM

## 2014-08-13 DIAGNOSIS — Z885 Allergy status to narcotic agent status: Secondary | ICD-10-CM | POA: Diagnosis not present

## 2014-08-13 DIAGNOSIS — I059 Rheumatic mitral valve disease, unspecified: Secondary | ICD-10-CM | POA: Diagnosis present

## 2014-08-13 DIAGNOSIS — Z823 Family history of stroke: Secondary | ICD-10-CM

## 2014-08-13 DIAGNOSIS — Z882 Allergy status to sulfonamides status: Secondary | ICD-10-CM | POA: Diagnosis not present

## 2014-08-13 DIAGNOSIS — M1711 Unilateral primary osteoarthritis, right knee: Secondary | ICD-10-CM

## 2014-08-13 DIAGNOSIS — Z8249 Family history of ischemic heart disease and other diseases of the circulatory system: Secondary | ICD-10-CM

## 2014-08-13 DIAGNOSIS — Z860100 Personal history of colon polyps, unspecified: Secondary | ICD-10-CM

## 2014-08-13 DIAGNOSIS — M898X9 Other specified disorders of bone, unspecified site: Secondary | ICD-10-CM | POA: Diagnosis present

## 2014-08-13 DIAGNOSIS — E039 Hypothyroidism, unspecified: Secondary | ICD-10-CM | POA: Diagnosis present

## 2014-08-13 DIAGNOSIS — D62 Acute posthemorrhagic anemia: Secondary | ICD-10-CM | POA: Diagnosis not present

## 2014-08-13 DIAGNOSIS — Z9849 Cataract extraction status, unspecified eye: Secondary | ICD-10-CM

## 2014-08-13 DIAGNOSIS — M25569 Pain in unspecified knee: Secondary | ICD-10-CM | POA: Diagnosis present

## 2014-08-13 DIAGNOSIS — Z7982 Long term (current) use of aspirin: Secondary | ICD-10-CM

## 2014-08-13 DIAGNOSIS — Z96651 Presence of right artificial knee joint: Secondary | ICD-10-CM

## 2014-08-13 HISTORY — PX: TOTAL KNEE ARTHROPLASTY: SHX125

## 2014-08-13 LAB — BASIC METABOLIC PANEL
ANION GAP: 10 (ref 5–15)
BUN: 20 mg/dL (ref 6–23)
CHLORIDE: 108 meq/L (ref 96–112)
CO2: 25 mEq/L (ref 19–32)
Calcium: 8.8 mg/dL (ref 8.4–10.5)
Creatinine, Ser: 0.75 mg/dL (ref 0.50–1.10)
GFR calc Af Amer: >90 mL/min (ref >90)
GFR calc non Af Amer: 78 mL/min — ABNORMAL LOW (ref >90)
Glucose, Bld: 95 mg/dL (ref 70–99)
POTASSIUM: 3.7 meq/L (ref 3.7–5.3)
Sodium: 143 mEq/L (ref 137–147)

## 2014-08-13 LAB — TYPE AND SCREEN
ABO/RH(D): A POS
ANTIBODY SCREEN: NEGATIVE

## 2014-08-13 LAB — ABO/RH: ABO/RH(D): A POS

## 2014-08-13 SURGERY — ARTHROPLASTY, KNEE, TOTAL
Anesthesia: Spinal | Site: Knee | Laterality: Right

## 2014-08-13 MED ORDER — PROPOFOL 10 MG/ML IV BOLUS
INTRAVENOUS | Status: AC
  Filled 2014-08-13: qty 20

## 2014-08-13 MED ORDER — DEXAMETHASONE SODIUM PHOSPHATE 10 MG/ML IJ SOLN
INTRAMUSCULAR | Status: DC | PRN
  Administered 2014-08-13: 5 mg via INTRAVENOUS

## 2014-08-13 MED ORDER — EPHEDRINE SULFATE 50 MG/ML IJ SOLN
INTRAMUSCULAR | Status: DC | PRN
  Administered 2014-08-13: 10 mg via INTRAVENOUS
  Administered 2014-08-13 (×2): 5 mg via INTRAVENOUS

## 2014-08-13 MED ORDER — RIVAROXABAN 10 MG PO TABS
10.0000 mg | ORAL_TABLET | Freq: Every day | ORAL | Status: DC
  Administered 2014-08-14 – 2014-08-16 (×3): 10 mg via ORAL
  Filled 2014-08-13 (×5): qty 1

## 2014-08-13 MED ORDER — EPHEDRINE SULFATE 50 MG/ML IJ SOLN
INTRAMUSCULAR | Status: AC
  Filled 2014-08-13: qty 1

## 2014-08-13 MED ORDER — LACTATED RINGERS IV SOLN
INTRAVENOUS | Status: DC | PRN
  Administered 2014-08-13 (×2): via INTRAVENOUS

## 2014-08-13 MED ORDER — DIPHENHYDRAMINE HCL 12.5 MG/5ML PO ELIX: 12.5000 mg | ORAL_SOLUTION | ORAL | Status: DC | PRN

## 2014-08-13 MED ORDER — METOPROLOL TARTRATE 1 MG/ML IV SOLN
INTRAVENOUS | Status: DC | PRN
  Administered 2014-08-13 (×2): 1 mg via INTRAVENOUS

## 2014-08-13 MED ORDER — LACTATED RINGERS IV SOLN: INTRAVENOUS | Status: DC

## 2014-08-13 MED ORDER — FUROSEMIDE 40 MG PO TABS
40.0000 mg | ORAL_TABLET | Freq: Every day | ORAL | Status: DC | PRN
  Filled 2014-08-13: qty 1

## 2014-08-13 MED ORDER — ONDANSETRON HCL 4 MG/2ML IJ SOLN
INTRAMUSCULAR | Status: DC | PRN
  Administered 2014-08-13: 4 mg via INTRAVENOUS

## 2014-08-13 MED ORDER — DIAZEPAM 2 MG PO TABS
5.0000 mg | ORAL_TABLET | Freq: Two times a day (BID) | ORAL | Status: DC | PRN
  Administered 2014-08-13: 6 mg via ORAL
  Filled 2014-08-13: qty 3

## 2014-08-13 MED ORDER — BUPIVACAINE IN DEXTROSE 0.75-8.25 % IT SOLN
INTRATHECAL | Status: DC | PRN
  Administered 2014-08-13: 1.8 mL via INTRATHECAL

## 2014-08-13 MED ORDER — METOPROLOL TARTRATE 1 MG/ML IV SOLN
INTRAVENOUS | Status: AC
  Filled 2014-08-13: qty 5

## 2014-08-13 MED ORDER — HYDROMORPHONE HCL 1 MG/ML IJ SOLN
INTRAMUSCULAR | Status: AC
  Filled 2014-08-13: qty 1

## 2014-08-13 MED ORDER — ALBUTEROL SULFATE 4 MG PO TABS: 4.0000 mg | ORAL_TABLET | Freq: Every day | ORAL | Status: DC | PRN

## 2014-08-13 MED ORDER — ACETAMINOPHEN 650 MG RE SUPP
650.0000 mg | Freq: Four times a day (QID) | RECTAL | Status: DC | PRN
  Filled 2014-08-13: qty 1

## 2014-08-13 MED ORDER — LIDOCAINE HCL (CARDIAC) 20 MG/ML IV SOLN
INTRAVENOUS | Status: AC
  Filled 2014-08-13: qty 5

## 2014-08-13 MED ORDER — MENTHOL 3 MG MT LOZG
1.0000 | LOZENGE | OROMUCOSAL | Status: DC | PRN
  Filled 2014-08-13: qty 9

## 2014-08-13 MED ORDER — CEFAZOLIN SODIUM-DEXTROSE 2-3 GM-% IV SOLR
2.0000 g | INTRAVENOUS | Status: AC
  Administered 2014-08-13: 2 g via INTRAVENOUS

## 2014-08-13 MED ORDER — LEVOTHYROXINE SODIUM 88 MCG PO TABS
88.0000 ug | ORAL_TABLET | Freq: Every day | ORAL | Status: DC
  Administered 2014-08-14 – 2014-08-16 (×3): 88 ug via ORAL
  Filled 2014-08-13 (×4): qty 1

## 2014-08-13 MED ORDER — MIDAZOLAM HCL 5 MG/5ML IJ SOLN
INTRAMUSCULAR | Status: DC | PRN
  Administered 2014-08-13: 2 mg via INTRAVENOUS

## 2014-08-13 MED ORDER — TRANEXAMIC ACID 100 MG/ML IV SOLN
1000.0000 mg | INTRAVENOUS | Status: AC
  Administered 2014-08-13: 1000 mg via INTRAVENOUS
  Filled 2014-08-13: qty 10

## 2014-08-13 MED ORDER — FENTANYL CITRATE 0.05 MG/ML IJ SOLN
INTRAMUSCULAR | Status: AC
  Filled 2014-08-13: qty 2

## 2014-08-13 MED ORDER — SODIUM CHLORIDE 0.9 % IR SOLN
Status: DC | PRN
  Administered 2014-08-13: 2000 mL

## 2014-08-13 MED ORDER — 0.9 % SODIUM CHLORIDE (POUR BTL) OPTIME
TOPICAL | Status: DC | PRN
  Administered 2014-08-13: 1000 mL

## 2014-08-13 MED ORDER — ATROPINE SULFATE 0.4 MG/ML IJ SOLN
INTRAMUSCULAR | Status: AC
Start: 2014-08-13 — End: 2014-08-13
  Filled 2014-08-13: qty 1

## 2014-08-13 MED ORDER — CEFAZOLIN SODIUM 1-5 GM-% IV SOLN
1.0000 g | Freq: Four times a day (QID) | INTRAVENOUS | Status: AC
  Administered 2014-08-13 (×2): 1 g via INTRAVENOUS
  Filled 2014-08-13 (×2): qty 50

## 2014-08-13 MED ORDER — MEPERIDINE HCL 50 MG/ML IJ SOLN: 6.2500 mg | INTRAMUSCULAR | Status: DC | PRN

## 2014-08-13 MED ORDER — PROMETHAZINE HCL 25 MG/ML IJ SOLN: 6.2500 mg | INTRAMUSCULAR | Status: DC | PRN

## 2014-08-13 MED ORDER — DEXAMETHASONE SODIUM PHOSPHATE 10 MG/ML IJ SOLN
INTRAMUSCULAR | Status: AC
  Filled 2014-08-13: qty 1

## 2014-08-13 MED ORDER — ZOLPIDEM TARTRATE 5 MG PO TABS: 5.0000 mg | ORAL_TABLET | Freq: Every evening | ORAL | Status: DC | PRN

## 2014-08-13 MED ORDER — METOCLOPRAMIDE HCL 5 MG PO TABS
5.0000 mg | ORAL_TABLET | Freq: Three times a day (TID) | ORAL | Status: DC | PRN
  Filled 2014-08-13: qty 2

## 2014-08-13 MED ORDER — TRAMADOL HCL 50 MG PO TABS: 100.0000 mg | ORAL_TABLET | Freq: Four times a day (QID) | ORAL | Status: DC | PRN

## 2014-08-13 MED ORDER — PHENOL 1.4 % MT LIQD
1.0000 | OROMUCOSAL | Status: DC | PRN
  Filled 2014-08-13: qty 177

## 2014-08-13 MED ORDER — DEXTROSE 5 % IV SOLN
500.0000 mg | Freq: Four times a day (QID) | INTRAVENOUS | Status: DC | PRN
  Administered 2014-08-13: 500 mg via INTRAVENOUS
  Filled 2014-08-13: qty 5

## 2014-08-13 MED ORDER — FENTANYL CITRATE 0.05 MG/ML IJ SOLN
INTRAMUSCULAR | Status: DC | PRN
  Administered 2014-08-13: 100 ug via INTRAVENOUS

## 2014-08-13 MED ORDER — HYDROMORPHONE HCL 1 MG/ML IJ SOLN
0.2500 mg | INTRAMUSCULAR | Status: DC | PRN
  Administered 2014-08-13 (×2): 0.5 mg via INTRAVENOUS
  Administered 2014-08-13: 0.25 mg via INTRAVENOUS
  Administered 2014-08-13: 0.5 mg via INTRAVENOUS
  Administered 2014-08-13: 0.25 mg via INTRAVENOUS

## 2014-08-13 MED ORDER — PROPOFOL INFUSION 10 MG/ML OPTIME
INTRAVENOUS | Status: DC | PRN
  Administered 2014-08-13: 75 ug/kg/min via INTRAVENOUS

## 2014-08-13 MED ORDER — DOCUSATE SODIUM 100 MG PO CAPS
100.0000 mg | ORAL_CAPSULE | Freq: Two times a day (BID) | ORAL | Status: DC
  Administered 2014-08-13 – 2014-08-16 (×6): 100 mg via ORAL
  Filled 2014-08-13 (×3): qty 1

## 2014-08-13 MED ORDER — CALCIUM CARBONATE ANTACID 500 MG PO CHEW
1500.0000 mg | CHEWABLE_TABLET | Freq: Every morning | ORAL | Status: DC
  Administered 2014-08-14 – 2014-08-16 (×3): 1500 mg via ORAL
  Filled 2014-08-13 (×3): qty 3

## 2014-08-13 MED ORDER — ESMOLOL HCL 10 MG/ML IV SOLN
INTRAVENOUS | Status: DC | PRN
  Administered 2014-08-13 (×2): 20 mg via INTRAVENOUS
  Administered 2014-08-13: 10 mg via INTRAVENOUS

## 2014-08-13 MED ORDER — ONDANSETRON HCL 4 MG/2ML IJ SOLN
INTRAMUSCULAR | Status: AC
  Filled 2014-08-13: qty 2

## 2014-08-13 MED ORDER — ONDANSETRON HCL 4 MG/2ML IJ SOLN: 4.0000 mg | Freq: Four times a day (QID) | INTRAMUSCULAR | Status: DC | PRN

## 2014-08-13 MED ORDER — HYDROMORPHONE HCL 1 MG/ML IJ SOLN
1.0000 mg | INTRAMUSCULAR | Status: DC | PRN
  Administered 2014-08-13 – 2014-08-14 (×6): 1 mg via INTRAVENOUS
  Filled 2014-08-13 (×6): qty 1

## 2014-08-13 MED ORDER — ESTRADIOL 0.1 MG/GM VA CREA
1.0000 | TOPICAL_CREAM | Freq: Every day | VAGINAL | Status: DC
  Administered 2014-08-15: 1 via VAGINAL
  Filled 2014-08-13: qty 42.5

## 2014-08-13 MED ORDER — INFLUENZA VAC SPLIT QUAD 0.5 ML IM SUSY
0.5000 mL | PREFILLED_SYRINGE | INTRAMUSCULAR | Status: DC
  Filled 2014-08-13 (×3): qty 0.5

## 2014-08-13 MED ORDER — METHOCARBAMOL 500 MG PO TABS
500.0000 mg | ORAL_TABLET | Freq: Four times a day (QID) | ORAL | Status: DC | PRN
  Administered 2014-08-13 – 2014-08-15 (×5): 500 mg via ORAL
  Filled 2014-08-13 (×5): qty 1

## 2014-08-13 MED ORDER — SODIUM CHLORIDE 0.9 % IJ SOLN
INTRAMUSCULAR | Status: AC
  Filled 2014-08-13: qty 10

## 2014-08-13 MED ORDER — OXYCODONE HCL 5 MG PO TABS
5.0000 mg | ORAL_TABLET | ORAL | Status: DC | PRN
  Administered 2014-08-13 – 2014-08-14 (×6): 10 mg via ORAL
  Administered 2014-08-15: 5 mg via ORAL
  Administered 2014-08-15: 10 mg via ORAL
  Administered 2014-08-15: 5 mg via ORAL
  Administered 2014-08-15: 10 mg via ORAL
  Administered 2014-08-16 (×2): 5 mg via ORAL
  Filled 2014-08-13 (×2): qty 1
  Filled 2014-08-13 (×2): qty 2
  Filled 2014-08-13: qty 1
  Filled 2014-08-13 (×3): qty 2
  Filled 2014-08-13: qty 1
  Filled 2014-08-13 (×3): qty 2

## 2014-08-13 MED ORDER — ACETAMINOPHEN 325 MG PO TABS
650.0000 mg | ORAL_TABLET | Freq: Four times a day (QID) | ORAL | Status: DC | PRN
  Administered 2014-08-14 – 2014-08-15 (×2): 650 mg via ORAL
  Filled 2014-08-13 (×2): qty 2

## 2014-08-13 MED ORDER — PROPOFOL 10 MG/ML IV BOLUS
INTRAVENOUS | Status: DC | PRN
  Administered 2014-08-13: 20 mg via INTRAVENOUS

## 2014-08-13 MED ORDER — CEFAZOLIN SODIUM-DEXTROSE 2-3 GM-% IV SOLR
INTRAVENOUS | Status: AC
  Filled 2014-08-13: qty 50

## 2014-08-13 MED ORDER — METOCLOPRAMIDE HCL 5 MG/ML IJ SOLN: 5.0000 mg | Freq: Three times a day (TID) | INTRAMUSCULAR | Status: DC | PRN

## 2014-08-13 MED ORDER — ESMOLOL HCL 10 MG/ML IV SOLN
INTRAVENOUS | Status: AC
Start: 2014-08-13 — End: 2014-08-13
  Filled 2014-08-13: qty 10

## 2014-08-13 MED ORDER — MIDAZOLAM HCL 2 MG/2ML IJ SOLN
INTRAMUSCULAR | Status: AC
  Filled 2014-08-13: qty 2

## 2014-08-13 MED ORDER — ALUM & MAG HYDROXIDE-SIMETH 200-200-20 MG/5ML PO SUSP: 30.0000 mL | ORAL | Status: DC | PRN

## 2014-08-13 MED ORDER — ONDANSETRON HCL 4 MG PO TABS
4.0000 mg | ORAL_TABLET | Freq: Four times a day (QID) | ORAL | Status: DC | PRN
  Filled 2014-08-13: qty 1

## 2014-08-13 MED ORDER — SODIUM CHLORIDE 0.9 % IV SOLN
INTRAVENOUS | Status: DC
  Administered 2014-08-13 – 2014-08-15 (×3): via INTRAVENOUS

## 2014-08-13 MED ORDER — FENTANYL CITRATE 0.05 MG/ML IJ SOLN: 25.0000 ug | INTRAMUSCULAR | Status: DC | PRN

## 2014-08-13 MED ORDER — ALBUTEROL SULFATE (2.5 MG/3ML) 0.083% IN NEBU: 3.0000 mL | INHALATION_SOLUTION | RESPIRATORY_TRACT | Status: DC | PRN

## 2014-08-13 SURGICAL SUPPLY — 54 items
BAG ZIPLOCK 12X15 (MISCELLANEOUS) IMPLANT
BANDAGE ELASTIC 6 VELCRO ST LF (GAUZE/BANDAGES/DRESSINGS) ×2 IMPLANT
BANDAGE ESMARK 6X9 LF (GAUZE/BANDAGES/DRESSINGS) ×1 IMPLANT
BENZOIN TINCTURE PRP APPL 2/3 (GAUZE/BANDAGES/DRESSINGS) ×2 IMPLANT
BLADE SAG 18X100X1.27 (BLADE) IMPLANT
BNDG ESMARK 6X9 LF (GAUZE/BANDAGES/DRESSINGS) ×2
BOWL SMART MIX CTS (DISPOSABLE) ×2 IMPLANT
CEMENT BONE 1-PACK (Cement) ×4 IMPLANT
CUFF TOURN SGL QUICK 34 (TOURNIQUET CUFF) ×1
CUFF TRNQT CYL 34X4X40X1 (TOURNIQUET CUFF) ×1 IMPLANT
DRAPE EXTREMITY TIBURON (DRAPES) ×2 IMPLANT
DRAPE POUCH INSTRU U-SHP 10X18 (DRAPES) ×2 IMPLANT
DRAPE SHEET LG 3/4 BI-LAMINATE (DRAPES) ×2 IMPLANT
DRAPE U-SHAPE 47X51 STRL (DRAPES) ×2 IMPLANT
DRSG AQUACEL AG ADV 3.5X10 (GAUZE/BANDAGES/DRESSINGS) IMPLANT
DRSG PAD ABDOMINAL 8X10 ST (GAUZE/BANDAGES/DRESSINGS) ×2 IMPLANT
DURAPREP 26ML APPLICATOR (WOUND CARE) ×2 IMPLANT
ELECT REM PT RETURN 9FT ADLT (ELECTROSURGICAL) ×2
ELECTRODE REM PT RTRN 9FT ADLT (ELECTROSURGICAL) ×1 IMPLANT
EVACUATOR 1/8 PVC DRAIN (DRAIN) IMPLANT
FACESHIELD WRAPAROUND (MASK) ×8 IMPLANT
GAUZE SPONGE 4X4 12PLY STRL (GAUZE/BANDAGES/DRESSINGS) ×2 IMPLANT
GAUZE XEROFORM 1X8 LF (GAUZE/BANDAGES/DRESSINGS) IMPLANT
GLOVE BIO SURGEON STRL SZ7.5 (GLOVE) ×2 IMPLANT
GLOVE BIOGEL PI IND STRL 8 (GLOVE) ×2 IMPLANT
GLOVE BIOGEL PI INDICATOR 8 (GLOVE) ×2
GLOVE ECLIPSE 8.0 STRL XLNG CF (GLOVE) ×2 IMPLANT
GOWN STRL REUS W/TWL XL LVL3 (GOWN DISPOSABLE) ×4 IMPLANT
HANDPIECE INTERPULSE COAX TIP (DISPOSABLE) ×1
IMMOBILIZER KNEE 20 (SOFTGOODS) ×2
IMMOBILIZER KNEE 20 THIGH 36 (SOFTGOODS) ×1 IMPLANT
KIT BASIN OR (CUSTOM PROCEDURE TRAY) ×2 IMPLANT
KNEE/VIT E POLY LINER LEVEL 1B ×2 IMPLANT
NDL SAFETY ECLIPSE 18X1.5 (NEEDLE) IMPLANT
NEEDLE HYPO 18GX1.5 SHARP (NEEDLE)
PACK TOTAL JOINT (CUSTOM PROCEDURE TRAY) ×2 IMPLANT
PADDING CAST COTTON 6X4 STRL (CAST SUPPLIES) ×4 IMPLANT
POSITIONER SURGICAL ARM (MISCELLANEOUS) ×2 IMPLANT
SET HNDPC FAN SPRY TIP SCT (DISPOSABLE) ×1 IMPLANT
SET PAD KNEE POSITIONER (MISCELLANEOUS) ×2 IMPLANT
STRIP CLOSURE SKIN 1/2X4 (GAUZE/BANDAGES/DRESSINGS) ×4 IMPLANT
SUCTION FRAZIER 12FR DISP (SUCTIONS) ×2 IMPLANT
SUT MNCRL AB 4-0 PS2 18 (SUTURE) ×2 IMPLANT
SUT VIC AB 0 CT1 27 (SUTURE) ×1
SUT VIC AB 0 CT1 27XBRD ANTBC (SUTURE) ×1 IMPLANT
SUT VIC AB 1 CT1 27 (SUTURE) ×3
SUT VIC AB 1 CT1 27XBRD ANTBC (SUTURE) ×3 IMPLANT
SUT VIC AB 2-0 CT1 27 (SUTURE) ×2
SUT VIC AB 2-0 CT1 TAPERPNT 27 (SUTURE) ×2 IMPLANT
SYR 50ML LL SCALE MARK (SYRINGE) IMPLANT
TOWEL OR 17X26 10 PK STRL BLUE (TOWEL DISPOSABLE) ×2 IMPLANT
TRAY FOLEY CATH 14FRSI W/METER (CATHETERS) ×2 IMPLANT
WATER STERILE IRR 1500ML POUR (IV SOLUTION) ×2 IMPLANT
WRAP KNEE MAXI GEL POST OP (GAUZE/BANDAGES/DRESSINGS) ×2 IMPLANT

## 2014-08-13 NOTE — Evaluation (Signed)
Physical Therapy Evaluation Patient Details Name: Carla Kelly MRN: [id number] DOB: [birth year] Today's Date: 08/13/2014   History of Present Illness  R TKR  Clinical Impression  Pt s/p R TKR presents with decreased R LE strength/ROM and post op pain limiting functional mobility.  Pt would benefit from follow up rehab at SNF level to maximize IND and safety prior to return home with limited assist.    Follow Up Recommendations SNF    Equipment Recommendations  None recommended by PT    Recommendations for Other Services OT consult     Precautions / Restrictions Precautions Precautions: Knee Required Braces or Orthoses: Knee Immobilizer - Right Knee Immobilizer - Right: Discontinue once straight leg raise with < 10 degree lag Restrictions Weight Bearing Restrictions: No Other Position/Activity Restrictions: WBAT      Mobility  Bed Mobility Overal bed mobility: Needs Assistance Bed Mobility: Supine to Sit     Supine to sit: Mod assist     General bed mobility comments: cues for sequence and use of UEs to self assist  Transfers Overall transfer level: Needs assistance Equipment used: Rolling walker (2 wheeled) Transfers: Sit to/from Stand Sit to Stand: Mod assist         General transfer comment: cues for LE management and use of UEs to self assist  Ambulation/Gait Ambulation/Gait assistance: Min assist;Mod assist Ambulation Distance (Feet): 18 Feet Assistive device: Rolling walker (2 wheeled) Gait Pattern/deviations: Step-to pattern;Decreased step length - right;Decreased step length - left;Shuffle;Trunk flexed Gait velocity: decr   General Gait Details: Cues for sequence, posture and position from [employer]            Wheelchair Mobility    Modified Rankin (Stroke Patients Only)       Balance                                             Pertinent Vitals/Pain Pain Assessment: 0-10 Pain Score: 6  Pain Location: R  knee Pain Descriptors / Indicators: Aching;Sore Pain Intervention(s): Limited activity within patient's tolerance;Monitored during session;Premedicated before session;Ice applied    Home Living Family/patient expects to be discharged to:: Skilled nursing facility Living Arrangements: Alone               Additional Comments: Plans [place]    Prior Function Level of Independence: Independent;Independent with assistive device(s)               Hand Dominance   Dominant Hand: Right    Extremity/Trunk Assessment   Upper Extremity Assessment: Overall WFL for tasks assessed           Lower Extremity Assessment: RLE deficits/detail RLE Deficits / Details: 2/5 quads with AAROM at knee -10 - 45       Communication   Communication: No difficulties  Cognition Arousal/Alertness: Awake/alert Behavior During Therapy: WFL for tasks assessed/performed Overall Cognitive Status: Within Functional Limits for tasks assessed                      General Comments      Exercises Total Joint Exercises Ankle Circles/Pumps: AROM;10 reps;Both;Supine Quad Sets: AROM;Both;10 reps;Supine Heel Slides: AAROM;10 reps;Supine;Right Straight Leg Raises: AAROM;Right;10 reps;Supine      Assessment/Plan    PT Assessment Patient needs continued PT services  PT Diagnosis Difficulty walking   PT Problem List Decreased strength;Decreased range  of motion;Decreased activity tolerance;Decreased mobility;Decreased knowledge of use of DME;Pain  PT Treatment Interventions DME instruction;Gait training;Functional mobility training;Therapeutic activities;Therapeutic exercise;Patient/family education   PT Goals (Current goals can be found in the Care Plan section) Acute Rehab PT Goals Patient Stated Goal: Resume previous lifestyle with decreased pain PT Goal Formulation: With patient Time For Goal Achievement: 08/20/14 Potential to Achieve Goals: Good    Frequency 7X/week    Barriers to discharge        Co-evaluation               End of Session Equipment Utilized During Treatment: Gait belt;Right knee immobilizer Activity Tolerance: Patient tolerated treatment well Patient left: in chair;with call bell/phone within reach;with family/visitor present Nurse Communication: Mobility status         Time: 2902-1115 PT Time Calculation (min): 32 min   Charges:   PT Evaluation $Initial PT Evaluation Tier I: 1 Procedure PT Treatments $Gait Training: 8-22 mins $Therapeutic Exercise: 8-22 mins   PT G Codes:          Carla Kelly 08/13/2014, 5:43 PM

## 2014-08-13 NOTE — H&P (Signed)
TOTAL KNEE ADMISSION H&P  Patient is being admitted for right total knee arthroplasty.  Subjective:  Chief Complaint:right knee pain.  HPI: Carla Kelly, 80 y.o. female, has a history of pain and functional disability in the right knee due to arthritis and has failed non-surgical conservative treatments for greater than 12 weeks to includeNSAID's and/or analgesics, corticosteriod injections, viscosupplementation injections, flexibility and strengthening excercises, use of assistive devices and activity modification.  Onset of symptoms was gradual, starting 5 years ago with gradually worsening course since that time. The patient noted prior procedures on the knee to include  arthroscopy on the right knee(s).  Patient currently rates pain in the right knee(s) at 10 out of 10 with activity. Patient has night pain, worsening of pain with activity and weight bearing, pain that interferes with activities of daily living, pain with passive range of motion, crepitus and joint swelling.  Patient has evidence of subchondral sclerosis, periarticular osteophytes and joint space narrowing by imaging studies. There is no active infection.  Patient Active Problem List   Diagnosis Date Noted  . Arthritis of right knee 08/13/2014  . Amnesia 02/20/2014  . Transient global amnesia 02/20/2014  . Encounter for Medicare annual wellness exam 12/07/2013  . Allergic drug rash 03/16/2013  . Psoriasis 02/16/2013  . Other screening mammogram 08/19/2012  . Pedal edema 03/11/2012  . B12 DEFICIENCY 09/05/2009  . Unspecified vitamin D deficiency 11/08/2008  . HYPERLIPIDEMIA 08/25/2008  . OSTEOPENIA 08/25/2008  . Personal History of Colonic Polyps 08/25/2008  . HERPES ZOSTER, HX OF 06/03/2008  . HYPOTHYROIDISM 08/04/2007  . HYPOGLYCEMIA 08/04/2007  . ASTHMA 08/04/2007  . IRRITABLE BOWEL SYNDROME 08/04/2007  . VAGINITIS, ATROPHIC 08/04/2007  . Osteoarth NOS-Unspec 08/04/2007  . FIBROMYALGIA 08/04/2007  . URINARY  INCONTINENCE 08/04/2007   Past Medical History  Diagnosis Date  . Hypothyroidism   . Osteoarthritis   . IBS (irritable bowel syndrome)   . Fibromyalgia   . History of herpes zoster   . HLD (hyperlipidemia)   . Adenomatous colon polyp   . Other B-complex deficiencies   . Osteoarthrosis, unspecified whether generalized or localized, unspecified site   . Embolism and thrombosis of unspecified site   . Disorder of bone and cartilage, unspecified   . Unspecified vitamin D deficiency   . Unspecified urinary incontinence   . Mitral valve disorder     "leaking mitral valve-very mild"  . Swelling of extremity, right     right leg  . Psoriasis   . 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 Asthma   . Hypoglycemia     Past Surgical History  Procedure Laterality Date  . Abdominal hysterectomy  1970    fibroids; endometriosis  . Cervical disc surgery      x 2  . Colonoscopy  2/02    polyps, BE-polyps  . US transvaginal pelvic modified  10/02    small left ovarian cyst  . Bladder tack  5/05  . Cataract extraction Right 12/13    Dr George Ina  . Cholecystectomy  2004  . Mouth surgery      lower teeth have 4 studs to keep in place  . Eye surgery      right eye    Prescriptions prior to admission  Medication Sig Dispense Refill  . albuterol (PROVENTIL) 4 MG tablet Take 4 mg by mouth daily as needed for wheezing or shortness of breath.       . Calcium 1500 MG tablet Take 1,500 mg by mouth every  morning.       . diazepam (VALIUM) 5 MG tablet Take 5-7.5 mg by mouth 2 (two) times daily as needed for anxiety or sedation.      Marland Kitchen estradiol (ESTRACE) 0.1 MG/GM vaginal cream Place 1 Applicatorful vaginally at bedtime.      . fluocinolone (DERMA-SMOOTHE/FS SCALP) 0.01 % external oil Apply topically 3 (three) times daily.  120 mL  1  . furosemide (LASIX) 40 MG tablet Take 40 mg by mouth daily as needed for edema.      . Multiple Vitamin (MULTIVITAMIN) tablet Take 1 tablet by mouth every morning.        Marland Kitchen SYNTHROID 88 MCG tablet Take 1 tablet (88 mcg total) by mouth daily before breakfast.  90 tablet  1  . traMADol (ULTRAM) 50 MG tablet Take by mouth every 8 (eight) hours as needed for moderate pain.      Marland Kitchen albuterol (PROVENTIL HFA;VENTOLIN HFA) 108 (90 BASE) MCG/ACT inhaler Inhale 2 puffs into the lungs every 4 (four) hours as needed.        Marland Kitchen aspirin 325 MG tablet Take 325 mg by mouth daily.       . diclofenac (FLECTOR) 1.3 % PTCH Place 1 patch onto the skin as needed (for pain).        Allergies  Allergen Reactions  . Alcohol-Sulfur [Sulfur] Shortness Of Breath and Swelling  . Ergocalciferol Other (See Comments)    REACTION: GI side effects  . Morphine Nausea And Vomiting  . Tolterodine Tartrate Other (See Comments)    REACTION: ? reaction  . Myrbetriq [Mirabegron] Other (See Comments)    unknown  . Nortriptyline Hcl Other (See Comments)    unknown    History  Substance Use Topics  . Smoking status: Never Smoker   . Smokeless tobacco: Never Used  . Alcohol Use: No     Comment: "is allergic"    Family History  Problem Relation Age of Onset  . Heart attack Mother   . Stroke Mother     several  . Lupus Brother   . Other Sister     benign brain tumor  . Asthma Daughter   . Other Brother     heart problems  . Colon cancer      1st cousin     Review of Systems  All other systems reviewed and are negative.   Objective:  Physical Exam  Constitutional: She is oriented to person, place, and time. She appears well-developed and well-nourished.  HENT:  Head: Normocephalic and atraumatic.  Eyes: EOM are normal. Pupils are equal, round, and reactive to light.  Neck: Normal range of motion. Neck supple.  Cardiovascular: Normal rate and regular rhythm.   Respiratory: Effort normal and breath sounds normal.  GI: Soft. Bowel sounds are normal.  Musculoskeletal:       Right knee: She exhibits decreased range of motion and effusion. Tenderness found. Medial joint line  and lateral joint line tenderness noted.  Neurological: She is alert and oriented to person, place, and time.  Skin: Skin is warm and dry.  Psychiatric: She has a normal mood and affect.    Vital signs in last 24 hours: Temp:  [97.5 F (36.4 C)] 97.5 F (36.4 C) (09/18 0544) Pulse Rate:  [83] 83 (09/18 0544) Resp:  [16] 16 (09/18 0544) BP: (134)/(65) 134/65 mmHg (09/18 0544) SpO2:  [99 %] 99 % (09/18 0544) Weight:  [69.854 kg (154 lb)] 69.854 kg (154 lb) (09/18 9326)  Labs:  Estimated body mass index is 24.49 kg/(m^2) as calculated from the following:   Height as of this encounter: 5' 6.5" (1.689 m).   Weight as of this encounter: 69.854 kg (154 lb).   Imaging Review Plain radiographs demonstrate severe degenerative joint disease of the right knee(s). The overall alignment ismild varus. The bone quality appears to be good for age and reported activity level.  Assessment/Plan:  End stage arthritis, right knee   The patient history, physical examination, clinical judgment of the provider and imaging studies are consistent with end stage degenerative joint disease of the right knee(s) and total knee arthroplasty is deemed medically necessary. The treatment options including medical management, injection therapy arthroscopy and arthroplasty were discussed at length. The risks and benefits of total knee arthroplasty were presented and reviewed. The risks due to aseptic loosening, infection, stiffness, patella tracking problems, thromboembolic complications and other imponderables were discussed. The patient acknowledged the explanation, agreed to proceed with the plan and consent was signed. Patient is being admitted for inpatient treatment for surgery, pain control, PT, OT, prophylactic antibiotics, VTE prophylaxis, progressive ambulation and ADL's and discharge planning. The patient is planning to be discharged home with home health services

## 2014-08-13 NOTE — Transfer of Care (Signed)
Immediate Anesthesia Transfer of Care Note  Patient: Carla Kelly  Procedure(s) Performed: Procedure(s): RIGHT TOTAL KNEE ARTHROPLASTY (Right)  Patient Location: PACU  Anesthesia Type:Spinal  Level of Consciousness: awake, alert , oriented and patient cooperative  Airway & Oxygen Therapy: Patient Spontanous Breathing and Patient connected to face mask oxygen  Post-op Assessment: Report given to PACU RN, Post -op Vital signs reviewed and stable and heart rate down to 70.   SAB level - patient can wiggle toes but denies any pain.  Post vital signs: Reviewed and stable  Complications: No apparent anesthesia complications and will have EKG and bloodwork done in PACU for elevated heart rate.

## 2014-08-13 NOTE — Anesthesia Preprocedure Evaluation (Addendum)
Anesthesia Evaluation  Patient identified by MRN, date of birth, ID band Patient awake    Reviewed: Allergy & Precautions, H&P , NPO status , Patient's Chart, lab work & pertinent test results  History of Anesthesia Complications Negative for: history of anesthetic complications  Airway Mallampati: III TM Distance: >3 FB Neck ROM: Full    Dental no notable dental hx. (+) Edentulous Upper, Edentulous Lower   Pulmonary asthma ,  breath sounds clear to auscultation  Pulmonary exam normal       Cardiovascular Exercise Tolerance: Good negative cardio ROS  Rhythm:Regular Rate:Normal     Neuro/Psych Anxiety negative neurological ROS     GI/Hepatic negative GI ROS, Neg liver ROS,   Endo/Other  Hypothyroidism   Renal/GU negative Renal ROS  negative genitourinary   Musculoskeletal  (+) Arthritis -, Osteoarthritis,  Fibromyalgia -  Abdominal   Peds negative pediatric ROS (+)  Hematology negative hematology ROS (+)   Anesthesia Other Findings   Reproductive/Obstetrics negative OB ROS                          Anesthesia Physical Anesthesia Plan  ASA: II  Anesthesia Plan: Spinal   Post-op Pain Management:    Induction: Intravenous  Airway Management Planned: Nasal Cannula  Additional Equipment:   Intra-op Plan:   Post-operative Plan:   Informed Consent: I have reviewed the patients History and Physical, chart, labs and discussed the procedure including the risks, benefits and alternatives for the proposed anesthesia with the patient or authorized representative who has indicated his/her understanding and acceptance.   Dental advisory given  Plan Discussed with: CRNA  Anesthesia Plan Comments:        Anesthesia Quick Evaluation

## 2014-08-13 NOTE — Brief Op Note (Signed)
08/13/2014  9:12 AM  PATIENT:  Carla Kelly  80 y.o. female  PRE-OPERATIVE DIAGNOSIS:  Severe osteoarthritis right knee  POST-OPERATIVE DIAGNOSIS:  Severe osteoarthritis right knee  PROCEDURE:  Procedure(s): RIGHT TOTAL KNEE ARTHROPLASTY (Right)  SURGEON:  Surgeon(s) and Role:    * Mcarthur Rossetti, MD - Primary  PHYSICIAN ASSISTANT: Benita Stabile, PA-C  ANESTHESIA:   spinal  EBL:  Total I/O In: 1000 [I.V.:1000] Out: 200 [Urine:200]  BLOOD ADMINISTERED:none  DRAINS: none   LOCAL MEDICATIONS USED:  NONE  SPECIMEN:  No Specimen   COUNTS:  YES  TOURNIQUET:   Total Tourniquet Time Documented: Thigh (Right) - 59 minutes Total: Thigh (Right) - 59 minutes   DICTATION: .Other Dictation: Dictation Number [id number]  PLAN OF CARE: Admit to inpatient   PATIENT DISPOSITION:  PACU - hemodynamically stable.   Delay start of Pharmacological VTE agent (>24hrs) due to surgical blood loss or risk of bleeding: no

## 2014-08-13 NOTE — Anesthesia Postprocedure Evaluation (Signed)
  Anesthesia Post-op Note  Patient: Carla Kelly  Procedure(s) Performed: Procedure(s) (LRB): RIGHT TOTAL KNEE ARTHROPLASTY (Right)  Patient Location: PACU  Anesthesia Type: Spinal  Level of Consciousness: awake and alert   Airway and Oxygen Therapy: Patient Spontanous Breathing  Post-op Pain: mild  Post-op Assessment: Post-op Vital signs reviewed, Patient's Cardiovascular Status Stable, Respiratory Function Stable, Patent Airway and No signs of Nausea or vomiting  Last Vitals:  Filed Vitals:   08/13/14 1238  BP: 142/68  Pulse: 78  Temp: 36.7 C  Resp: 16    Post-op Vital Signs: stable   Complications: No apparent anesthesia complications

## 2014-08-13 NOTE — Op Note (Signed)
Carla Kelly, Carla Kelly                  ACCOUNT NO.:  [id number]  MEDICAL RECORD NO.:  [id number]  LOCATION:  [facility]                         FACILITY:  [facility]  PHYSICIAN:  Carla Kelly, M.D.DATE OF BIRTH:  22-Jun-1933  DATE OF PROCEDURE:  08/13/2014 DATE OF DISCHARGE:                              OPERATIVE REPORT   PREOPERATIVE DIAGNOSIS:  Painful osteoarthritis and degenerative joint disease, right knee.  POSTOPERATIVE DIAGNOSIS:  Painful osteoarthritis and degenerative joint disease, right knee.  PROCEDURE:  Right total knee arthroplasty.  IMPLANTS:  Stryker triathlon knee with size 5 femur, size 5 tibial tray, 11 mm fix bearing polyethylene insert, size 29 patellar button.  SURGEON:  Carla Guest. Ninfa Linden, MD  ASSISTANT:  Erskine Emery, PA-C  ANESTHESIA:  Spinal.  ANTIBIOTICS:  3 g IV Ancef.  TOURNIQUET TIME:  Sixty minutes.  BLOOD LOSS:  Less than 100 mL.  COMPLICATIONS:  None.  INDICATIONS:  [name] is a 80 year old female, who I have known for many years now.  She has had debilitating right knee pain.  We provided multiple injections including steroids and supplement injections.  She has had a knee arthroscopic intervention and unknown varus deformity of that knee.  There was significant medial compartment and patellofemoral osteoarthritis and varus deformity.  She has gotten to where her pain is daily, her activities of daily living are limited and her mobility has been limited secondary to the pain.  At this point, she wished to proceed with a total knee arthroplasty given the failure of conservative treatment.  She understands the risks of acute blood loss anemia, nerve and vessel injury, fracture, infection, and DVT.  She understands the goals are decreased pain, improved mobility, and overall improved quality of life.  PROCEDURE DESCRIPTION:  After informed consent was obtained, appropriate right knee was marked.  She was brought to the operating  room, a spinal anesthesia was obtained while she was on her stretcher.  She was then laid in a supine position.  A Foley catheter was placed and nonsterile tourniquet was placed around her upper right leg.  Her right leg was then prepped and draped from the knee down the foot with DuraPrep and sterile drapes.  A time-out was called and she was identified as correct patient and correct right knee.  We then used an Esmarch to wrap out the leg and the tourniquet was inflated to 300 mm of pressure.  We then made a midline incision directly over the patella, carried this proximally and distally.  We dissected meticulously down to the knee joint and performed a medial parapatellar arthrotomy.  We did encounter an effusion from the knee which we drained.  We then opened up the knee and found the knee to be essentially devoid of cartilage and the medial compartment was significantly hardened.  We removed osteophytes from the knee as well as remnants of the medial and lateral meniscus.  We removed remnants of ACL and PCL.  We then used the extramedullary guide on the tibia with the knee in a flexed position.  We set our tibia cut, taking 9 mm off the high side, correcting for varus valgus and a neutral slope. We  then made this cut.  We then went into the intercondylar area, and drilled a hole for the alignment rod intramedullary for the femur.  We put our distal femoral cut to take 10 mm off, decided the flexion contracture.  Once we did this, we brought the knee back down in extension and with the 9 mm extension block, she was straight.  We then went back to the femur and put our femoral sizing guide based off the epicondylar axis and Whiteside's line and we chose a size femur based off of this.  We then made our anterior and posterior cuts followed by our chamfer cuts.  We then made our femoral box cut based of the size 5 femur.  We then went back to the tibia and trialed for a size 5  tibial tray.  We made our keel cut off with this.  With all trial components in place, we trialed a 9 mm and 11 mm polyethylene insert and I felt 11 mm gave her some more stability, with still good range of motion.  We then made our patellar cut and drilled 3 holes for size 29 patellar button. We then removed all trial components and irrigated the knee with normal saline solution.  We then mixed our cement and cemented the real triathlon Stryker, triathlon size 5 tibia followed by the 5 femur, we cleaned the cement debris from the knee and placed the real 11 mm fix bearing polyethylene insert and cemented the patellar button.  Once the cement had hardened, we let the tourniquet down.  Hemostasis was obtained with electrocautery.  We then irrigated the knee again with normal saline solution using pulsatile lavage and closed the arthrotomy with interrupted #1 Ethibond suture followed by 0 Vicryl in the deep tissue, 2-0 Vicryl in subcutaneous tissue, 4-0 Monocryl subcuticular stitch and Steri-Strips on the skin.  A  well-padded sterile dressing was applied.  She was then taken off of the table into the recovery room in stable condition.  All final counts were correct.  There were no complications noted.  Of note, Erskine Emery PA-C, assisted in the entire case and his assistance was crucial for facilitating this case at all levels.     Carla Kelly, M.D.     CYB/MEDQ  D:  08/13/2014  T:  08/13/2014  Job:  [id number]

## 2014-08-13 NOTE — Progress Notes (Signed)
Utilization review completed.  

## 2014-08-13 NOTE — Anesthesia Procedure Notes (Signed)
Spinal  Patient location during procedure: OR Start time: 08/13/2014 7:30 AM End time: 08/13/2014 7:37 AM Staffing CRNA/Resident: Alfonso Patten J Performed by: resident/CRNA  Preanesthetic Checklist Completed: patient identified, surgical consent, pre-op evaluation, timeout performed, IV checked, risks and benefits discussed and monitors and equipment checked Spinal Block Patient position: sitting Prep: Betadine and site prepped and draped Patient monitoring: heart rate, continuous pulse ox and blood pressure Approach: midline Location: L3-4 Injection technique: single-shot Needle Needle type: Spinocan  Needle gauge: 22 G Needle length: 5 cm Additional Notes Tray expiration date noted and not expired.  Two attempts - #1 with 24G Sprotte, #2 with 22G Spinocan.  Good CSF return.  No c/o paresthesia or pain.  Patient tolerated well.

## 2014-08-14 NOTE — Progress Notes (Signed)
Physical Therapy Treatment Patient Details Name: Carla Kelly MRN: [id number] DOB: [birth year] Today's Date: 08/14/2014    History of Present Illness R TKR    PT Comments    Steady progress.  Follow Up Recommendations  SNF     Equipment Recommendations  None recommended by PT    Recommendations for Other Services OT consult     Precautions / Restrictions Precautions Precautions: Knee;Fall Required Braces or Orthoses: Knee Immobilizer - Right Knee Immobilizer - Right: Discontinue once straight leg raise with < 10 degree lag Restrictions Weight Bearing Restrictions: No Other Position/Activity Restrictions: WBAT    Mobility  Bed Mobility Overal bed mobility: Needs Assistance Bed Mobility: Supine to Sit;Sit to Supine     Supine to sit: Min assist;Mod assist Sit to supine: Min assist;Mod assist   General bed mobility comments: cues for sequence and use of L LE to self assist  Transfers Overall transfer level: Needs assistance Equipment used: Rolling walker (2 wheeled) Transfers: Sit to/from Stand Sit to Stand: Min assist         General transfer comment: cues for LE management and use of UEs to self assist  Ambulation/Gait Ambulation/Gait assistance: Min assist Ambulation Distance (Feet): 52 Feet Assistive device: Rolling walker (2 wheeled) Gait Pattern/deviations: Step-to pattern;Decreased step length - right;Decreased step length - left;Shuffle;Trunk flexed Gait velocity: decr   General Gait Details: Cues for sequence, posture and position from [employer]            Wheelchair Mobility    Modified Rankin (Stroke Patients Only)       Balance Overall balance assessment: Needs assistance Sitting-balance support: No upper extremity supported;Feet supported Sitting balance-Leahy Scale: Fair     Standing balance support: Bilateral upper extremity supported Standing balance-Leahy Scale: Poor                      Cognition  Arousal/Alertness: Awake/alert Behavior During Therapy: WFL for tasks assessed/performed Overall Cognitive Status: Within Functional Limits for tasks assessed                      Exercises      General Comments        Pertinent Vitals/Pain Pain Assessment: 0-10 Pain Score: 5  Pain Location: R knee Pain Intervention(s): Limited activity within patient's tolerance;Monitored during session;Premedicated before session;Ice applied    Home Living Family/patient expects to be discharged to:: Skilled nursing facility Living Arrangements: Alone                  Prior Function            PT Goals (current goals can now be found in the care plan section) Acute Rehab PT Goals Patient Stated Goal: to rehab then home PT Goal Formulation: With patient Time For Goal Achievement: 08/20/14 Potential to Achieve Goals: Good Progress towards PT goals: Progressing toward goals    Frequency  7X/week    PT Plan Current plan remains appropriate    Co-evaluation             End of Session Equipment Utilized During Treatment: Gait belt;Right knee immobilizer Activity Tolerance: Patient tolerated treatment well Patient left: in bed;with call bell/phone within reach;with family/visitor present     Time: 1500-1526 PT Time Calculation (min): 26 min  Charges:  $Gait Training: 23-37 mins                    G Codes:  Zhanna Melin 08/14/2014, 5:09 PM

## 2014-08-14 NOTE — Progress Notes (Signed)
MD notified of patient's temp of 103 this evening. Will encourage patient to use IS, turn, cough, deep breathe. Will also give tylenol. Will check patient throughout the night.

## 2014-08-14 NOTE — Progress Notes (Signed)
Patient ID: Carla Kelly, female   DOB: 11-Jul-1933, 80 y.o.   MRN: [id number] Postoperative day 1 right total knee arthroplasty. Patient is sitting up in a chair and comfortable. Continue physical therapy progressive ambulation anticipate discharge to rehabilitation on Monday.

## 2014-08-14 NOTE — Evaluation (Signed)
Occupational Therapy Evaluation and Discharge Patient Details Name: DASHANNA KINNAMON MRN: [id number] DOB: 1933-10-08 Today's Date: 08/14/2014    History of Present Illness R TKR   Clinical Impression   This 80 yo female admitted and underwent above presents to acute OT with decreased balance, increased pain, decreased mobility, decreased ROM in right knee all affecting her ability to care for herself at an Independent to Mod I level as she was pta. She will benefit from continued OT at SNF to get back to this level. Acute OT will sign off.    Follow Up Recommendations  SNF    Equipment Recommendations  None recommended by OT       Precautions / Restrictions Precautions Precautions: Knee;Fall Required Braces or Orthoses: Knee Immobilizer - Right Knee Immobilizer - Right: Discontinue once straight leg raise with < 10 degree lag Restrictions Weight Bearing Restrictions: No Other Position/Activity Restrictions: WBAT      Mobility Bed Mobility     General bed mobility comments: Pt up in recliner upon arrival  Transfers Overall transfer level: Needs assistance Equipment used: Rolling walker (2 wheeled) Transfers: Sit to/from Stand Sit to Stand: Min assist            Balance Overall balance assessment: Needs assistance Sitting-balance support: No upper extremity supported;Feet supported Sitting balance-Leahy Scale: Fair     Standing balance support: Bilateral upper extremity supported Standing balance-Leahy Scale: Poor                              ADL Overall ADL's : Needs assistance/impaired Eating/Feeding: Independent;Sitting   Grooming: Set up;Sitting   Upper Body Bathing: Set up;Sitting   Lower Body Bathing: Min guard;With adaptive equipment;Sit to/from stand   Upper Body Dressing : Set up;Sitting   Lower Body Dressing: Min guard;With adaptive equipment;Sit to/from stand   Toilet Transfer: Minimal assistance;Ambulation;RW (sit<>stand at  recliner)   Toileting- Water quality scientist and Hygiene: Min guard;Sit to/from stand                         Pertinent Vitals/Pain Pain Assessment: 0-10 Pain Score: 7  Pain Location: right knee Pain Descriptors / Indicators: Aching;Burning;Sore Pain Intervention(s): Monitored during session;Ice applied     Hand Dominance  Right   Extremity/Trunk Assessment Upper Extremity Assessment Upper Extremity Assessment: Overall WFL for tasks assessed              Cognition Arousal/Alertness: Awake/alert Behavior During Therapy: WFL for tasks assessed/performed Overall Cognitive Status: Within Functional Limits for tasks assessed                                Home Living Family/patient expects to be discharged to:: Skilled nursing facility Living Arrangements: Alone                                           OT Diagnosis: Generalized weakness;Acute pain   OT Problem List: Decreased strength;Decreased range of motion;Decreased activity tolerance;Impaired balance (sitting and/or standing);Pain;Decreased knowledge of use of DME or AE   OT Treatment/Interventions:      OT Goals(Current goals can be found in the care plan section) Acute Rehab OT Goals Patient Stated Goal: to rehab then home  OT Frequency:  End of Session Equipment Utilized During Treatment: Gait belt;Rolling walker CPM Right Knee CPM Right Knee: Off  Activity Tolerance: Patient tolerated treatment well Patient left: in chair;with call bell/phone within reach   Time: 1154-1209 OT Time Calculation (min): 15 min Charges:  OT General Charges $OT Visit: 1 Procedure OT Evaluation $Initial OT Evaluation Tier I: 1 Procedure OT Treatments $Self Care/Home Management : 8-22 mins  Almon Register [phone] 08/14/2014, 1:44 PM

## 2014-08-14 NOTE — Progress Notes (Signed)
CARE MANAGEMENT NOTE 08/14/2014  Patient:  Carla Kelly, Carla Kelly   Account Number:  [id number]  Date Initiated:  08/14/2014  Documentation initiated by:  [facility]  Subjective/Objective Assessment:   Right total knee arthroplasty     Action/Plan:   Anticipated DC Date:  08/16/2014   Anticipated DC Plan:  SKILLED NURSING FACILITY  In-house referral  Clinical Social Worker      DC Planning Services  CM consult      Choice offered to / List presented to:             Status of service:  Completed, signed off Medicare Important Message given?   (If response is "NO", the following Medicare IM given date fields will be blank) Date Medicare IM given:   Medicare IM given by:   Date Additional Medicare IM given:   Additional Medicare IM given by:    Discharge Disposition:  [place]  Per UR Regulation:  Reviewed for med. necessity/level of care/duration of stay  If discussed at [place] of Stay Meetings, dates discussed:    Comments:  08/14/2014 1510 Spoke to pt and she is requesting SNF -rehab at dc. Jonnie Finner RN CCM Case Mgmt phone [phone]

## 2014-08-14 NOTE — Clinical Social Work Psychosocial (Signed)
Clinical Social Work Department BRIEF PSYCHOSOCIAL ASSESSMENT 08/14/2014  Patient:  Carla Kelly, Carla Kelly     Account Number:  [id number]     Admit date:  08/13/2014  Clinical Social Worker:  Daiva Huge  Date/Time:  08/14/2014 01:18 PM  Referred by:  Physician  Date Referred:  08/14/2014 Referred for  SNF Placement   Other Referral:   Interview type:  Patient Other interview type:    PSYCHOSOCIAL DATA Living Status:  ALONE Admitted from facility:   Level of care:   Primary support name:  local adult children Primary support relationship to patient:  FAMILY Degree of support available:   good    CURRENT CONCERNS Current Concerns  Post-Acute Placement   Other Concerns:    SOCIAL WORK ASSESSMENT / PLAN CSW met with patient to discuss possible need for SNF at d.c. Patient reports that she has already arranged for a bed at [facility] which is near her home and family- "I have visited friends there and I plan to go for rehab".  Patient aware of her co-pay at SNF and that prior auth will be needed prior to the move-  Patient states she is feeling well- still feeling a little "loopy" from the anesthesia and has had difficulty resting because of another patient across the hall- will ask RN if patient can be moved to another room for better rest and satisfaction.   Assessment/plan status:  Other - See comment Other assessment/ plan:   FL2 and PASARR for SNF   Information/referral to community resources:    PATIENT'S/FAMILY'S RESPONSE TO PLAN OF CARE: Patient very pleasant and appreciatve of care and assistance provided- she agrees to plans as above.       Eduard Clos, MSW, [place] weekend coverage

## 2014-08-14 NOTE — Progress Notes (Signed)
Physical Therapy Treatment Patient Details Name: Carla Kelly MRN: [id number] DOB: 1933-06-23 Today's Date: 08/14/2014    History of Present Illness R TKR    PT Comments    POD # 1 am session.  Assisted pt OOB with increased time.  Mild c/o dizziness but "better than yesterday".  Assisted with amb in hallway limited distance recliner chair following for safety.  Returned to room to perform some TKR TE's limited by increased c/o nausea.  Applied ICE.  Follow Up Recommendations  SNF     Equipment Recommendations       Recommendations for Other Services       Precautions / Restrictions Precautions Precautions: Knee Required Braces or Orthoses: Knee Immobilizer - Right Knee Immobilizer - Right: Discontinue once straight leg raise with < 10 degree lag Restrictions Weight Bearing Restrictions: No Other Position/Activity Restrictions: WBAT    Mobility  Bed Mobility Overal bed mobility: Needs Assistance       Supine to sit: Mod assist     General bed mobility comments: cues for sequence and use of UEs to self assist  Transfers Overall transfer level: Needs assistance Equipment used: Rolling walker (2 wheeled) Transfers: Sit to/from Stand Sit to Stand: Mod assist;Min assist         General transfer comment: cues for LE management and use of UEs to self assist  Ambulation/Gait Ambulation/Gait assistance: Min assist Ambulation Distance (Feet): 25 Feet Assistive device: Rolling walker (2 wheeled) Gait Pattern/deviations: Step-to pattern;Decreased stance time - right Gait velocity: decr   General Gait Details: Cues for sequence, posture and position from RW   Stairs            Wheelchair Mobility    Modified Rankin (Stroke Patients Only)       Balance                                    Cognition                            Exercises   Total Knee Replacement TE's 10 reps B LE ankle pumps 10 reps towel squeezes 10 reps  knee presses Followed by ICE     General Comments        Pertinent Vitals/Pain      Home Living                      Prior Function            PT Goals (current goals can now be found in the care plan section) Progress towards PT goals: Progressing toward goals    Frequency  7X/week    PT Plan      Co-evaluation             End of Session Equipment Utilized During Treatment: Gait belt;Right knee immobilizer Activity Tolerance: Patient limited by fatigue Patient left: in chair;with call bell/phone within reach;with family/visitor present     Time: 1540-0867 PT Time Calculation (min): 39 min  Charges:  $Gait Training: 8-22 mins $Therapeutic Exercise: 8-22 mins $Therapeutic Activity: 8-22 mins                    G Codes:      Rica Koyanagi  PTA [facility]  Acute  Rehab Pager      [phone]

## 2014-08-14 NOTE — Clinical Social Work Placement (Signed)
Clinical Social Work Department CLINICAL SOCIAL WORK PLACEMENT NOTE 08/14/2014  Patient:  Carla Kelly, Carla Kelly  Account Number:  [id number] Admit date:  08/13/2014  Clinical Social Worker:  Daiva Huge  Date/time:  08/14/2014 01:24 PM  Clinical Social Work is seeking post-discharge placement for this patient at the following level of care:   SKILLED NURSING   (*CSW will update this form in Epic as items are completed)   08/14/2014  Patient/family provided with [place] Department of Clinical Social Work's list of facilities offering this level of care within the geographic area requested by the patient (or if unable, by the patient's family).  08/14/2014  Patient/family informed of their freedom to choose among providers that offer the needed level of care, that participate in Medicare, Medicaid or managed care program needed by the patient, have an available bed and are willing to accept the patient.  08/14/2014  Patient/family informed of [facility]' ownership interest in [facility], as well as of the fact that they are under no obligation to receive care at this facility.  PASARR submitted to EDS on 08/14/2014 PASARR number received on 08/14/2014  FL2 transmitted to all facilities in geographic area requested by pt/family on  08/14/2014 FL2 transmitted to all facilities within larger geographic area on   Patient informed that his/her managed care company has contracts with or will negotiate with  certain facilities, including the following:   [place] Medicare- aware of copays     Patient/family informed of bed offers received:   Patient chooses bed at  Physician recommends and patient chooses bed at    Patient to be transferred to  on   Patient to be transferred to facility by  Patient and family notified of transfer on  Name of family member notified:    The following physician request were entered in Epic:   Additional Comments: Eduard Clos, MSW,  [place] weekend coverage

## 2014-08-15 LAB — CBC
HCT: 27.9 % — ABNORMAL LOW (ref 36.0–46.0)
HEMOGLOBIN: 9.6 g/dL — AB (ref 12.0–15.0)
MCH: 31 pg (ref 26.0–34.0)
MCHC: 34.4 g/dL (ref 30.0–36.0)
MCV: 90 fL (ref 78.0–100.0)
PLATELETS: 127 10*3/uL — AB (ref 150–400)
RBC: 3.1 MIL/uL — ABNORMAL LOW (ref 3.87–5.11)
RDW: 12.8 % (ref 11.5–15.5)
WBC: 8.1 10*3/uL (ref 4.0–10.5)

## 2014-08-15 MED ORDER — RIVAROXABAN 10 MG PO TABS: 10.0000 mg | ORAL_TABLET | Freq: Every day | ORAL | Status: DC

## 2014-08-15 MED ORDER — OXYCODONE-ACETAMINOPHEN 5-325 MG PO TABS: 1.0000 | ORAL_TABLET | ORAL | Status: DC | PRN

## 2014-08-15 MED ORDER — METHOCARBAMOL 500 MG PO TABS: 500.0000 mg | ORAL_TABLET | Freq: Four times a day (QID) | ORAL | Status: DC | PRN

## 2014-08-15 NOTE — Progress Notes (Signed)
Physical Therapy Treatment Patient Details Name: Carla Kelly MRN: [id number] DOB: Sep 28, 1933 Today's Date: 08/15/2014    History of Present Illness R TKR    PT Comments    POD # 2 am session.  Applied KI and instructed on use for amb.  Assisted pt OOB with increased time and amb limited distance in hallway.  Very unsteady gait esp with turns.  HIGH FALL RISK. Pt required increased time and increased cueing.  Returned to room to perform TKR TE's followed by ICE.   Pt will need ST Rehab at SNF.  Follow Up Recommendations  SNF ([place])     Equipment Recommendations       Recommendations for Other Services       Precautions / Restrictions Precautions Precautions: Knee;Fall Required Braces or Orthoses: Knee Immobilizer - Right Knee Immobilizer - Right: Discontinue once straight leg raise with < 10 degree lag Restrictions Weight Bearing Restrictions: No Other Position/Activity Restrictions: WBAT    Mobility  Bed Mobility Overal bed mobility: Needs Assistance Bed Mobility: Supine to Sit     Supine to sit: Min assist     General bed mobility comments: cues for sequence and use of L LE to self assist plus increased time  Transfers Overall transfer level: Needs assistance Equipment used: Rolling walker (2 wheeled) Transfers: Sit to/from Stand Sit to Stand: Min assist         General transfer comment: cues for LE management and use of UEs to self assist.  Extra cueing for turns and backward steps.  Ambulation/Gait Ambulation/Gait assistance: Min assist Ambulation Distance (Feet): 43 Feet Assistive device: Rolling walker (2 wheeled) Gait Pattern/deviations: Step-to pattern;Decreased stance time - right;Trunk flexed Gait velocity: decreased   General Gait Details: 50% VC's on proper use RW as pt is use to using a standard.  Extra VC's for safety with turns and backward steps.  Unsteady gait with delayed corrective reaction responce.  HIGH FALL  RISK.   Stairs            Wheelchair Mobility    Modified Rankin (Stroke Patients Only)       Balance                                    Cognition                            Exercises   Total Knee Replacement TE's 10 reps B LE ankle pumps 10 reps towel squeezes 10 reps knee presses 10 reps heel slides  10 reps SAQ's 10 reps SLR's 10 reps ABD Followed by ICE     General Comments        Pertinent Vitals/Pain Pain Assessment: 0-10 Pain Score: 5  Pain Location: R knee Pain Descriptors / Indicators: Aching;Burning;Sore Pain Intervention(s): Limited activity within patient's tolerance;Monitored during session;Ice applied;Repositioned    Home Living                      Prior Function            PT Goals (current goals can now be found in the care plan section) Progress towards PT goals: Progressing toward goals    Frequency       PT Plan      Co-evaluation             End of  Session Equipment Utilized During Treatment: Gait belt;Right knee immobilizer Activity Tolerance: Patient tolerated treatment well Patient left: in chair;with call bell/phone within reach     Time: 1122-1154 PT Time Calculation (min): 32 min  Charges:  $Gait Training: 8-22 mins $Therapeutic Exercise: 8-22 mins                    G Codes:      Rica Koyanagi  PTA [facility]  Acute  Rehab Pager      [phone]

## 2014-08-15 NOTE — Progress Notes (Signed)
Patient ID: Carla Kelly, female   DOB: [birth year], 80 y.o.   MRN: [id number] Patient comfortable this morning. Plan for discharge to skilled nursing. IV fluids discontinued.

## 2014-08-15 NOTE — Progress Notes (Signed)
Physical Therapy Treatment Patient Details Name: Carla Kelly MRN: [id number] DOB: 05-Dec-1932 Today's Date: 08/15/2014    History of Present Illness R TKR    PT Comments    POD # 2 pm session.  Assisted pt out of recliner to amb to bathroom.  Assisted in bathroom with hygiene as pt was unable to perform and maintain balance.  Amb limited distance in hallway due to fatigue.  Assited back to bed for CPM. Pt progressing slowly and will need ST Rehab at SNF prior to D/C to home.    Follow Up Recommendations  SNF ([place])     Equipment Recommendations       Recommendations for Other Services       Precautions / Restrictions Precautions Precautions: Knee;Fall Required Braces or Orthoses: Knee Immobilizer - Right Knee Immobilizer - Right: Discontinue once straight leg raise with < 10 degree lag Restrictions Weight Bearing Restrictions: No Other Position/Activity Restrictions: WBAT    Mobility  Bed Mobility Overal bed mobility: Needs Assistance Bed Mobility: Supine to Sit     Supine to sit: Min assist     General bed mobility comments: cues for sequence and use of L LE to self assist plus increased time  Transfers Overall transfer level: Needs assistance Equipment used: Rolling walker (2 wheeled) Transfers: Sit to/from Stand Sit to Stand: Min assist         General transfer comment: cues for LE management and use of UEs to self assist.  Extra cueing for turns and backward steps.  Ambulation/Gait Ambulation/Gait assistance: Min assist Ambulation Distance (Feet): 26 Feet Assistive device: Rolling walker (2 wheeled) Gait Pattern/deviations: Step-to pattern;Decreased stance time - right;Trunk flexed Gait velocity: decreased   General Gait Details: 50% VC's on proper use RW as pt is use to using a standard.  Extra VC's for safety with turns and backward steps.  Unsteady gait with delayed corrective reaction responce.  HIGH FALL RISK.   Stairs             Wheelchair Mobility    Modified Rankin (Stroke Patients Only)       Balance                                    Cognition                            Exercises      General Comments        Pertinent Vitals/Pain Pain Assessment: 0-10 Pain Score: 5  Pain Location: R knee Pain Descriptors / Indicators: Aching;Burning;Sore Pain Intervention(s): Limited activity within patient's tolerance;Monitored during session;Ice applied;Repositioned    Home Living                      Prior Function            PT Goals (current goals can now be found in the care plan section) Progress towards PT goals: Progressing toward goals    Frequency       PT Plan      Co-evaluation             End of Session Equipment Utilized During Treatment: Gait belt;Right knee immobilizer Activity Tolerance: Patient tolerated treatment well Patient left: in chair;with call bell/phone within reach     Time: 9518-8416 PT Time Calculation (min): 34 min  Charges:  $  Gait Training: 8-22 mins $Therapeutic Activity: 8-22 mins                    G Codes:      Rica Koyanagi  PTA [facility]  Acute  Rehab Pager      [phone]

## 2014-08-16 NOTE — Plan of Care (Signed)
Problem: Consults Goal: Diagnosis- Total Joint Replacement Outcome: Completed/Met Date Met:  08/16/14 Right Total Knee

## 2014-08-16 NOTE — Discharge Instructions (Signed)
Information on my medicine - XARELTO (Rivaroxaban)  This medication education was reviewed with me or my healthcare representative as part of my discharge preparation.  The pharmacist that spoke with me during my hospital stay was:  Lolita Patella, [facility]  Why was Xarelto prescribed for you? Xarelto was prescribed for you to reduce the risk of blood clots forming after orthopedic surgery. The medical term for these abnormal blood clots is venous thromboembolism (VTE).  What do you need to know about xarelto ? Take your Xarelto ONCE DAILY at the same time every day. You may take it either with or without food.  If you have difficulty swallowing the tablet whole, you may crush it and mix in applesauce just prior to taking your dose.  Take Xarelto exactly as prescribed by your doctor and DO NOT stop taking Xarelto without talking to the doctor who prescribed the medication.  Stopping without other VTE prevention medication to take the place of Xarelto may increase your risk of developing a clot.  After discharge, you should have regular check-up appointments with your healthcare provider that is prescribing your Xarelto.    What do you do if you miss a dose? If you miss a dose, take it as soon as you remember on the same day then continue your regularly scheduled once daily regimen the next day. Do not take two doses of Xarelto on the same day.   Important Safety Information A possible side effect of Xarelto is bleeding. You should call your healthcare provider right away if you experience any of the following:   Bleeding from an injury or your nose that does not stop.   Unusual colored urine (red or dark brown) or unusual colored stools (red or black).   Unusual bruising for unknown reasons.   A serious fall or if you hit your head (even if there is no bleeding).  Some medicines may interact with Xarelto and might increase your risk of bleeding while on Xarelto. To help  avoid this, consult your healthcare provider or pharmacist prior to using any new prescription or non-prescription medications, including herbals, vitamins, non-steroidal anti-inflammatory drugs (NSAIDs) and supplements.  This website has more information on Xarelto: [email or web address]   Pickup stool softener for constipation. Weight Bearing as tolerated  Right leg Progress activities slowly; work on knee range-of-motion Expect  knee soreness and swelling. Apply heat or ice as needed. Keep dressing clean dry and intact, may shower with dressing intact.

## 2014-08-16 NOTE — Progress Notes (Signed)
Pt Discharged to [facility]. Pt premedicated at 1200 before discharge. Gave report to Zachery Conch at [facility].

## 2014-08-16 NOTE — Progress Notes (Signed)
Pt temp 102.6 after encouraging her to use IS. Notified MD on call of her temp, no new orders given.

## 2014-08-16 NOTE — Progress Notes (Signed)
Subjective: 3 Days Post-Op Procedure(s) (LRB): RIGHT TOTAL KNEE ARTHROPLASTY (Right) Patient reports pain as moderate.  Asymptomatic acute blood loss anemia.  Low grade temp last evening.  Working well with therapy.  Objective: Vital signs in last 24 hours: Temp:  [99.6 F (37.6 C)-102.6 F (39.2 C)] 99.6 F (37.6 C) (09/21 0529) Pulse Rate:  [98-110] 98 (09/21 0529) Resp:  [17-18] 18 (09/21 0529) BP: (114-130)/(34-48) 114/41 mmHg (09/21 0529) SpO2:  [98 %-99 %] 98 % (09/21 0529)  Intake/Output from previous day: 09/20 0701 - 09/21 0700 In: 480 [P.O.:480] Out: 1600 [Urine:1600] Intake/Output this shift:     Recent Labs  08/15/14 0027  HGB 9.6*    Recent Labs  08/15/14 0027  WBC 8.1  RBC 3.10*  HCT 27.9*  PLT 127*    Recent Labs  08/13/14 1001  NA 143  K 3.7  CL 108  CO2 25  BUN 20  CREATININE 0.75  GLUCOSE 95  CALCIUM 8.8   No results found for this basename: LABPT, INR,  in the last 72 hours  Sensation intact distally Intact pulses distally Dorsiflexion/Plantar flexion intact Incision: no drainage No cellulitis present Compartment soft  Assessment/Plan: 3 Days Post-Op Procedure(s) (LRB): RIGHT TOTAL KNEE ARTHROPLASTY (Right) Advance diet Discharge to SNF today  Brodi Kari Y 08/16/2014, 7:11 AM

## 2014-08-16 NOTE — Progress Notes (Signed)
Clinical Social Work Department CLINICAL SOCIAL WORK PLACEMENT NOTE 08/16/2014  Patient:  Carla Kelly, Carla Kelly  Account Number:  [id number] Admit date:  08/13/2014  Clinical Social Worker:  Daiva Huge  Date/time:  08/14/2014 01:24 PM  Clinical Social Work is seeking post-discharge placement for this patient at the following level of care:   SKILLED NURSING   (*CSW will update this form in Epic as items are completed)   08/14/2014  Patient/family provided with [facility] Department of Clinical Social Work's list of facilities offering this level of care within the geographic area requested by the patient (or if unable, by the patient's family).  08/14/2014  Patient/family informed of their freedom to choose among providers that offer the needed level of care, that participate in Medicare, Medicaid or managed care program needed by the patient, have an available bed and are willing to accept the patient.  08/14/2014  Patient/family informed of [facility]' ownership interest in [facility], as well as of the fact that they are under no obligation to receive care at this facility.  PASARR submitted to EDS on 08/14/2014 PASARR number received on 08/14/2014  FL2 transmitted to all facilities in geographic area requested by pt/family on  08/14/2014 FL2 transmitted to all facilities within larger geographic area on   Patient informed that his/her managed care company has contracts with or will negotiate with  certain facilities, including the following:   [facility] [employer]- aware of copays     Patient/family informed of bed offers received:  08/16/2014 Patient chooses bed at [facility] Physician recommends and patient chooses bed at    Patient to be transferred to [facility] on  08/16/2014 Patient to be transferred to facility by FAMILY Patient and family notified of transfer on 08/16/2014 Name of family member notified:  Pt declined CSW assistance.  The following  physician request were entered in Epic:   Additional Comments: Pt is in agreement with d/c to SNF today. Daughter will provide transportation. [employer] has provided authorization for SNF. [facility] has reviewed D/C Summary, scripts, AVS. Scripts have been placed in D/C Packet. Packet has been provided to pt.  Werner Lean LCSW [phone]

## 2014-08-16 NOTE — Progress Notes (Signed)
Physical Therapy Treatment Patient Details Name: Carla Kelly MRN: [id number] DOB: [birth year] Today's Date: 08/16/2014    History of Present Illness R TKR    PT Comments    POD # 3 am session.  Assisted pt to bathroom then amb in hallway.  Returned to bed to perform TKR TE's followed by ICE.    Follow Up Recommendations  SNF ([place])     Equipment Recommendations       Recommendations for Other Services       Precautions / Restrictions Precautions Precautions: Knee;Fall Precaution Comments: Instructed pt on KI use for amb  Required Braces or Orthoses: Knee Immobilizer - Right Knee Immobilizer - Right: Discontinue once straight leg raise with < 10 degree lag Restrictions Weight Bearing Restrictions: No Other Position/Activity Restrictions: WBAT    Mobility  Bed Mobility Overal bed mobility: Needs Assistance Bed Mobility: Supine to Sit     Supine to sit: Min assist     General bed mobility comments: cues for sequence and use of L LE to self assist plus increased time  Transfers Overall transfer level: Needs assistance Equipment used: Rolling walker (2 wheeled) Transfers: Sit to/from Stand Sit to Stand: Min assist         General transfer comment: cues for LE management and use of UEs to self assist.  Extra cueing for turns and backward steps.  Ambulation/Gait Ambulation/Gait assistance: Min assist Ambulation Distance (Feet): 70 Feet Assistive device: Rolling walker (2 wheeled) Gait Pattern/deviations: Step-to pattern;Trunk flexed Gait velocity: decreased   General Gait Details: 25% VC's on proper hand placement and increased time.     Stairs            Wheelchair Mobility    Modified Rankin (Stroke Patients Only)       Balance                                    Cognition                            Exercises      General Comments        Pertinent Vitals/Pain      Home Living                       Prior Function            PT Goals (current goals can now be found in the care plan section) Progress towards PT goals: Progressing toward goals    Frequency       PT Plan Current plan remains appropriate    Co-evaluation             End of Session Equipment Utilized During Treatment: Gait belt;Right knee immobilizer Activity Tolerance: Patient tolerated treatment well Patient left: in bed;with call bell/phone within reach     Time: 1139-1218 PT Time Calculation (min): 39 min  Charges:  $Gait Training: 8-22 mins $Therapeutic Exercise: 8-22 mins $Therapeutic Activity: 8-22 mins                    G Codes:      Rica Koyanagi  PTA [facility]  Acute  Rehab Pager      [phone]

## 2014-08-16 NOTE — Discharge Summary (Signed)
Patient ID: Carla Kelly MRN: [id number] DOB/AGE: [birth year] 80 y.o.  Admit date: 08/13/2014 Discharge date: 08/16/2014  Admission Diagnoses:  Principal Problem:   Arthritis of right knee Active Problems:   Status post total right knee replacement   Discharge Diagnoses:  Same  Past Medical History  Diagnosis Date  . Hypothyroidism   . Osteoarthritis   . IBS (irritable bowel syndrome)   . Fibromyalgia   . History of herpes zoster   . HLD (hyperlipidemia)   . Adenomatous colon polyp   . Other B-complex deficiencies   . Osteoarthrosis, unspecified whether generalized or localized, unspecified site   . Embolism and thrombosis of unspecified site   . Disorder of bone and cartilage, unspecified   . Unspecified vitamin D deficiency   . Unspecified urinary incontinence   . Mitral valve disorder     "leaking mitral valve-very mild"  . Swelling of extremity, right     right leg  . Psoriasis   . 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 Asthma   . Hypoglycemia     Surgeries: Procedure(s): RIGHT TOTAL KNEE ARTHROPLASTY on 08/13/2014   Consultants:    Discharged Condition: Improved  Hospital Course: Carla Kelly is an 80 y.o. female who was admitted 08/13/2014 for operative treatment ofArthritis of right knee. Patient has severe unremitting pain that affects sleep, daily activities, and work/hobbies. After pre-op clearance the patient was taken to the operating room on 08/13/2014 and underwent  Procedure(s): RIGHT TOTAL KNEE ARTHROPLASTY.    Patient was given perioperative antibiotics: Anti-infectives   Start     Dose/Rate Route Frequency Ordered Stop   08/13/14 1400  ceFAZolin (ANCEF) IVPB 1 g/50 mL premix     1 g 100 mL/hr over 30 Minutes Intravenous Every 6 hours 08/13/14 1134 08/13/14 2030   08/13/14 0601  ceFAZolin (ANCEF) IVPB 2 g/50 mL premix     2 g 100 mL/hr over 30 Minutes Intravenous On call to O.R. 08/13/14 0601 08/13/14 0740       Patient was given sequential  compression devices, early ambulation, and chemoprophylaxis to prevent DVT.  Patient benefited maximally from hospital stay and there were no complications.    Recent vital signs: Patient Vitals for the past 24 hrs:  BP Temp Temp src Pulse Resp SpO2  08/16/14 0529 114/41 mmHg 99.6 F (37.6 C) Oral 98 18 98 %  08/15/14 2015 - 102.6 F (39.2 C) - - - -  08/15/14 1950 118/34 mmHg 102 F (38.9 C) Oral 99 17 99 %  08/15/14 1454 130/48 mmHg 99.9 F (37.7 C) Oral 110 18 98 %     Recent laboratory studies:  Recent Labs  08/13/14 1001 08/15/14 0027  WBC  --  8.1  HGB  --  9.6*  HCT  --  27.9*  PLT  --  127*  NA 143  --   K 3.7  --   CL 108  --   CO2 25  --   BUN 20  --   CREATININE 0.75  --   GLUCOSE 95  --   CALCIUM 8.8  --      Discharge Medications:     Medication List    STOP taking these medications       aspirin 325 MG tablet     diclofenac 1.3 % Ptch  Commonly known as:  FLECTOR     traMADol 50 MG tablet  Commonly known as:  ULTRAM      TAKE these medications  albuterol 4 MG tablet  Commonly known as:  PROVENTIL  Take 4 mg by mouth daily as needed for wheezing or shortness of breath.     albuterol 108 (90 BASE) MCG/ACT inhaler  Commonly known as:  PROVENTIL HFA;VENTOLIN HFA  Inhale 2 puffs into the lungs every 4 (four) hours as needed.     Calcium 1500 MG tablet  Take 1,500 mg by mouth every morning.     diazepam 5 MG tablet  Commonly known as:  VALIUM  Take 5-7.5 mg by mouth 2 (two) times daily as needed for anxiety or sedation.     estradiol 0.1 MG/GM vaginal cream  Commonly known as:  ESTRACE  Place 1 Applicatorful vaginally at bedtime.     fluocinolone 0.01 % external oil  Commonly known as:  DERMA-SMOOTHE/FS SCALP  Apply topically 3 (three) times daily.     furosemide 40 MG tablet  Commonly known as:  LASIX  Take 40 mg by mouth daily as needed for edema.     methocarbamol 500 MG tablet  Commonly known as:  ROBAXIN  Take 1  tablet (500 mg total) by mouth every 6 (six) hours as needed for muscle spasms.     multivitamin tablet  Take 1 tablet by mouth every morning.     oxyCODONE-acetaminophen 5-325 MG per tablet  Commonly known as:  ROXICET  Take 1-2 tablets by mouth every 4 (four) hours as needed for severe pain.     rivaroxaban 10 MG Tabs tablet  Commonly known as:  XARELTO  Take 1 tablet (10 mg total) by mouth daily with breakfast.     SYNTHROID 88 MCG tablet  Generic drug:  levothyroxine  Take 1 tablet (88 mcg total) by mouth daily before breakfast.        Diagnostic Studies: Dg Chest 2 View  08/06/2014   CLINICAL DATA:  Preoperative chest radiograph for total knee arthroplasty.  EXAM: CHEST  2 VIEW  COMPARISON:  05/09/2010.  FINDINGS: Cardiopericardial silhouette within normal limits. Mediastinal contours normal. Trachea midline. No airspace disease or effusion. Chronic bronchitic changes appear similar to the prior study. Cholecystectomy clips are present in the right upper quadrant.  IMPRESSION: No active cardiopulmonary disease.   Electronically Signed   By: Dereck Ligas M.D.   On: 08/06/2014 15:39   Dg Knee Right Port  08/13/2014   CLINICAL DATA:  Status post arthroplasty  EXAM: PORTABLE RIGHT KNEE - 1-2 VIEW  COMPARISON:  None.  FINDINGS: Frontal and lateral views were obtained. There is evidence of total knee replacement with femoral and tibial components appearing well-seated. No fracture or dislocation. Soft tissue air is an expected postoperative finding.  IMPRESSION: Prosthetic components appear well seated. No fracture or dislocation apparent.   Electronically Signed   By: Lowella Grip M.D.   On: 08/13/2014 10:24    Disposition:   To skilled nursing facility      Discharge Instructions   Call MD / Call 911    Complete by:  As directed   If you experience chest pain or shortness of breath, CALL 911 and be transported to the hospital emergency room.  If you develope a fever above  101 F, pus (white drainage) or increased drainage or redness at the wound, or calf pain, call your surgeon's office.     Constipation Prevention    Complete by:  As directed   Drink plenty of fluids.  Prune juice may be helpful.  You may use a stool softener, such  as Colace (over the counter) 100 mg twice a day.  Use MiraLax (over the counter) for constipation as needed.     Diet - low sodium heart healthy    Complete by:  As directed      Discharge instructions    Complete by:  As directed   Full weight as tolerated right knee/leg. Work on knee range-of-motion. Can get current dressing wet in the shower. Leave current dressing on until outpatient orthopedic follow-up     Discharge patient    Complete by:  As directed      Increase activity slowly as tolerated    Complete by:  As directed            Follow-up Information   Follow up with Mcarthur Rossetti, MD.   Specialty:  Orthopedic Surgery   Contact information:   [place] [address] [phone]       Follow up with Mcarthur Rossetti, MD In 2 weeks.   Specialty:  Orthopedic Surgery   Contact information:   [place] [address] [phone]        Signed: Mcarthur Rossetti 08/16/2014, 7:15 AM

## 2014-08-17 ENCOUNTER — Non-Acute Institutional Stay (SKILLED_NURSING_FACILITY): Payer: Medicare Other | Admitting: Adult Health

## 2014-08-17 DIAGNOSIS — Z96659 Presence of unspecified artificial knee joint: Secondary | ICD-10-CM

## 2014-08-17 DIAGNOSIS — IMO0002 Reserved for concepts with insufficient information to code with codable children: Secondary | ICD-10-CM

## 2014-08-17 DIAGNOSIS — R32 Unspecified urinary incontinence: Secondary | ICD-10-CM

## 2014-08-17 DIAGNOSIS — M1710 Unilateral primary osteoarthritis, unspecified knee: Secondary | ICD-10-CM

## 2014-08-17 DIAGNOSIS — J45909 Unspecified asthma, uncomplicated: Secondary | ICD-10-CM

## 2014-08-17 DIAGNOSIS — M1711 Unilateral primary osteoarthritis, right knee: Secondary | ICD-10-CM

## 2014-08-17 DIAGNOSIS — Z96651 Presence of right artificial knee joint: Secondary | ICD-10-CM

## 2014-08-17 DIAGNOSIS — N952 Postmenopausal atrophic vaginitis: Secondary | ICD-10-CM

## 2014-08-17 DIAGNOSIS — E039 Hypothyroidism, unspecified: Secondary | ICD-10-CM

## 2014-08-18 ENCOUNTER — Other Ambulatory Visit: Payer: Self-pay | Admitting: *Deleted

## 2014-08-18 ENCOUNTER — Encounter: Payer: Self-pay | Admitting: Adult Health

## 2014-08-18 MED ORDER — METHOCARBAMOL 500 MG PO TABS: 500.0000 mg | ORAL_TABLET | Freq: Four times a day (QID) | ORAL | Status: DC

## 2014-08-18 MED ORDER — OXYCODONE-ACETAMINOPHEN 5-325 MG PO TABS: ORAL_TABLET | ORAL | Status: DC

## 2014-08-18 NOTE — Telephone Encounter (Signed)
[facility] 

## 2014-08-18 NOTE — Progress Notes (Signed)
Patient ID: Carla Kelly, female   DOB: [birth year], 80 y.o.   MRN: [id number]     [facility]  Allergies  Allergen Reactions  . Alcohol-Sulfur [Sulfur] Shortness Of Breath and Swelling  . Ergocalciferol Other (See Comments)    REACTION: GI side effects  . Morphine Nausea And Vomiting  . Tolterodine Tartrate Other (See Comments)    REACTION: ? reaction  . Myrbetriq [Mirabegron] Other (See Comments)    unknown  . Nortriptyline Hcl Other (See Comments)    unknown     Chief Complaint  Patient presents with  . Hospitalization Follow-up    HPI:  She has been hospitalized for a right knee replacement and is here for short term rehab. she is having right knee pain which is not being adequately relief with muscle spasms present. She would like to have her pain medication adjusted.     Past Medical History  Diagnosis Date  . Hypothyroidism   . Osteoarthritis   . IBS (irritable bowel syndrome)   . Fibromyalgia   . History of herpes zoster   . HLD (hyperlipidemia)   . Adenomatous colon polyp   . Other B-complex deficiencies   . Osteoarthrosis, unspecified whether generalized or localized, unspecified site   . Embolism and thrombosis of unspecified site   . Disorder of bone and cartilage, unspecified   . Unspecified vitamin D deficiency   . Unspecified urinary incontinence   . Mitral valve disorder     "leaking mitral valve-very mild"  . Swelling of extremity, right     right leg  . Psoriasis   . 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 Asthma   . Hypoglycemia     Past Surgical History  Procedure Laterality Date  . Abdominal hysterectomy  1970    fibroids; endometriosis  . Cervical disc surgery      x 2  . Colonoscopy  2/02    polyps, BE-polyps  . US transvaginal pelvic modified  10/02    small left ovarian cyst  . Bladder tack  5/05  . Cataract extraction Right 12/13    Dr George Ina  . Cholecystectomy  2004  . Mouth surgery      lower teeth have 4 studs to  keep in place  . Eye surgery      right eye  . Total knee arthroplasty Right 08/13/2014    Procedure: RIGHT TOTAL KNEE ARTHROPLASTY;  Surgeon: Mcarthur Rossetti, MD;  Location: [facility];  Service: Orthopedics;  Laterality: Right;    VITAL SIGNS BP 145/87  Pulse 65  Ht 5\' 6"  (1.676 m)  Wt 145 lb (65.772 kg)  BMI 23.41 kg/m2  SpO2 99%   Patient's Medications  New Prescriptions   No medications on file  Previous Medications   ALBUTEROL (PROVENTIL HFA;VENTOLIN HFA) 108 (90 BASE) MCG/ACT INHALER    Inhale 2 puffs into the lungs every 4 (four) hours as needed.     ALBUTEROL (PROVENTIL) 4 MG TABLET    Take 4 mg by mouth daily as needed for wheezing or shortness of breath.    CALCIUM 1500 MG TABLET    Take 1,500 mg by mouth every morning.    DIAZEPAM (VALIUM) 5 MG TABLET    Take 5-7.5 mg by mouth 2 (two) times daily as needed for anxiety or sedation.   ESTRADIOL (ESTRACE) 0.1 MG/GM VAGINAL CREAM    Place 1 Applicatorful vaginally at bedtime.   FLUOCINOLONE (DERMA-SMOOTHE/FS SCALP) 0.01 % EXTERNAL OIL    Apply  topically 3 (three) times daily.   FUROSEMIDE (LASIX) 40 MG TABLET    Take 40 mg by mouth daily as needed for edema.   METHOCARBAMOL (ROBAXIN) 500 MG TABLET    Take 1 tablet (500 mg total) by mouth every 6 (six) hours as needed for muscle spasms.   MULTIPLE VITAMIN (MULTIVITAMIN) TABLET    Take 1 tablet by mouth every morning.    OXYCODONE-ACETAMINOPHEN (ROXICET) 5-325 MG PER TABLET    Take 1-2 tabs every 4 hours as needed    RIVAROXABAN (XARELTO) 10 MG TABS TABLET    Take 1 tablet (10 mg total) by mouth daily with breakfast.   SYNTHROID 88 MCG TABLET    Take 1 tablet (88 mcg total) by mouth daily before breakfast.  Modified Medications   No medications on file  Discontinued Medications   No medications on file    SIGNIFICANT DIAGNOSTIC EXAMS  9-111-15: chest x-ray: No active cardiopulmonary disease.  08-13-14: right knee x-ray: Prosthetic components appear well seated. No  fracture or dislocation apparent.    LABS REVIEWED:   08-06-14: wbc 5.0; hgb 13.2; hct 39.5; mcv 90.6; plt 173; glucose 85; bun 17; creat 0.67; k+4.3; na++140 08-13-14: glucose 95; bun 20; creat 0.75; k+3.7; na++143 08-15-14: wbc 8.1; hgb 9.6; hct 27.9; mcv 90 plt 127    Review of Systems  Constitutional: Negative for malaise/fatigue.  Respiratory: Negative for cough and shortness of breath.   Cardiovascular: Positive for leg swelling. Negative for chest pain and palpitations.       Right leg swelling present   Gastrointestinal: Negative for heartburn, abdominal pain and constipation.  Musculoskeletal: Positive for joint pain and myalgias.       Has right knee pain present   Skin:       Has bruising on right knee   Psychiatric/Behavioral: Negative for depression. The patient is not nervous/anxious.      Physical Exam  Constitutional: She is oriented to person, place, and time. She appears well-developed and well-nourished. No distress.  Neck: Neck supple. No JVD present. No thyromegaly present.  Cardiovascular: Normal rate, regular rhythm and normal heart sounds.   Respiratory: Effort normal and breath sounds normal. No respiratory distress. She has no wheezes.  GI: Soft. Bowel sounds are normal. She exhibits no distension. There is no tenderness.  Musculoskeletal:  Is able to move all extremities; is status post right knee replacement Has right knee swelling present; has right leg edema present   Neurological: She is alert and oriented to person, place, and time.  Skin: Skin is warm and dry. She is not diaphoretic.  Has right knee bruising present Right knee incision line without signs of infection present   Psychiatric: She has a normal mood and affect.      ASSESSMENT/ PLAN:  1. Arthritis right knee status post right knee replacement: will follow up with orthopedics as indicated; will continue therapy as directed. Will change the robaxin to 500 mg four times daily and  will change the percocet to 5/325 mg every 4 hours and every 2 hours as needed; will continue xarelto 10 mg daily for a total of 12 days  and will monitor her status   2. Edema will continue lasix 40 mg daily as needed and will monitor   3. Asthma will continue albuterol tab 4 mg daily as needed and 2 puffs every 4 hours as needed will monitor   4. Hypothyroidism: will continue synthroid 88 mcg daily   5. UI: she is  stable is on estrace vaginal cream daily    Time spent with patient 50 minutes   Ok Edwards NP [facility]  Contact [phone] Monday through Friday 8am- 5pm  After hours call [phone]

## 2014-08-19 ENCOUNTER — Encounter: Payer: Self-pay | Admitting: Internal Medicine

## 2014-08-19 ENCOUNTER — Non-Acute Institutional Stay (SKILLED_NURSING_FACILITY): Payer: Medicare Other | Admitting: Internal Medicine

## 2014-08-19 DIAGNOSIS — N952 Postmenopausal atrophic vaginitis: Secondary | ICD-10-CM

## 2014-08-19 DIAGNOSIS — M1711 Unilateral primary osteoarthritis, right knee: Secondary | ICD-10-CM

## 2014-08-19 DIAGNOSIS — M1710 Unilateral primary osteoarthritis, unspecified knee: Secondary | ICD-10-CM

## 2014-08-19 DIAGNOSIS — J45909 Unspecified asthma, uncomplicated: Secondary | ICD-10-CM

## 2014-08-19 DIAGNOSIS — E039 Hypothyroidism, unspecified: Secondary | ICD-10-CM

## 2014-08-19 DIAGNOSIS — R609 Edema, unspecified: Secondary | ICD-10-CM

## 2014-08-19 DIAGNOSIS — IMO0002 Reserved for concepts with insufficient information to code with codable children: Secondary | ICD-10-CM

## 2014-08-19 DIAGNOSIS — F411 Generalized anxiety disorder: Secondary | ICD-10-CM

## 2014-08-19 DIAGNOSIS — R600 Localized edema: Secondary | ICD-10-CM

## 2014-08-19 DIAGNOSIS — K219 Gastro-esophageal reflux disease without esophagitis: Secondary | ICD-10-CM

## 2014-08-19 NOTE — Progress Notes (Signed)
Patient ID: Carla Kelly, female   DOB: 04/28/1933, 80 y.o.   MRN: [id number]     Facility: [facility]    PCP: Loura Pardon, MD  Code Status: full code  Allergies  Allergen Reactions  . Alcohol-Sulfur [Sulfur] Shortness Of Breath and Swelling  . Ergocalciferol Other (See Comments)    REACTION: GI side effects  . Morphine Nausea And Vomiting  . Tolterodine Tartrate Other (See Comments)    REACTION: ? reaction  . Myrbetriq [Mirabegron] Other (See Comments)    unknown  . Nortriptyline Hcl Other (See Comments)    unknown    Chief Complaint: new admit  HPI:  80 y/o female patient is here for STR after hospital admission from 08/13/14-08/16/14 with right knee OA and is now s/p right knee arthroplasty. She is working with therapy team. Her pain is under control. She denies any complaints  Review of Systems:  Constitutional: Negative for fever, chills, diaphoresis.  HENT: Negative for congestion Respiratory: Negative for cough, sputum production, shortness of breath and wheezing.   Cardiovascular: Negative for chest pain, palpitations, orthopnea. Has right leg swelling.  Gastrointestinal: Negative for heartburn, nausea, vomiting, abdominal pain, diarrhea and constipation.  Genitourinary: Negative for dysuria Musculoskeletal: Negative for back pain, falls Skin: Negative for itching and rash.  Neurological: Negative for weakness,dizziness Psychiatric/Behavioral: Negative for depression   Past Medical History  Diagnosis Date  . Hypothyroidism   . Osteoarthritis   . IBS (irritable bowel syndrome)   . Fibromyalgia   . History of herpes zoster   . HLD (hyperlipidemia)   . Adenomatous colon polyp   . Other B-complex deficiencies   . Osteoarthrosis, unspecified whether generalized or localized, unspecified site   . Embolism and thrombosis of unspecified site   . Disorder of bone and cartilage, unspecified   . Unspecified vitamin D deficiency   .  Unspecified urinary incontinence   . Mitral valve disorder     "leaking mitral valve-very mild"  . Swelling of extremity, right     right leg  . Psoriasis   . 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 Asthma   . Hypoglycemia    Past Surgical History  Procedure Laterality Date  . Abdominal hysterectomy  1970    fibroids; endometriosis  . Cervical disc surgery      x 2  . Colonoscopy  2/02    polyps, BE-polyps  . US transvaginal pelvic modified  10/02    small left ovarian cyst  . Bladder tack  5/05  . Cataract extraction Right 12/13    Dr George Ina  . Cholecystectomy  2004  . Mouth surgery      lower teeth have 4 studs to keep in place  . Eye surgery      right eye  . Total knee arthroplasty Right 08/13/2014    Procedure: RIGHT TOTAL KNEE ARTHROPLASTY;  Surgeon: Mcarthur Rossetti, MD;  Location: [facility];  Service: Orthopedics;  Laterality: Right;   Social History:   reports that she has never smoked. She has never used smokeless tobacco. She reports that she does not drink alcohol or use illicit drugs.  Family History  Problem Relation Age of Onset  . Heart attack Mother   . Stroke Mother     several  . Lupus Brother   . Other Sister     benign brain tumor  . Asthma Daughter   . Other Brother     heart problems  . Colon cancer  1st cousin    Medications: Patient's Medications  New Prescriptions   No medications on file  Previous Medications   ALBUTEROL (PROVENTIL HFA;VENTOLIN HFA) 108 (90 BASE) MCG/ACT INHALER    Inhale 2 puffs into the lungs every 4 (four) hours as needed.     ALBUTEROL (PROVENTIL) 4 MG TABLET    Take 4 mg by mouth daily as needed for wheezing or shortness of breath.    CALCIUM 1500 MG TABLET    Take 1,500 mg by mouth every morning.    DIAZEPAM (VALIUM) 5 MG TABLET    Take 5-7.5 mg by mouth 2 (two) times daily as needed for anxiety or sedation.   ESTRADIOL (ESTRACE) 0.1 MG/GM VAGINAL CREAM    Place 1 Applicatorful vaginally at bedtime.     FLUOCINOLONE (DERMA-SMOOTHE/FS SCALP) 0.01 % EXTERNAL OIL    Apply topically 3 (three) times daily.   FUROSEMIDE (LASIX) 40 MG TABLET    Take 40 mg by mouth daily as needed for edema.   METHOCARBAMOL (ROBAXIN) 500 MG TABLET    Take 1 tablet (500 mg total) by mouth 4 (four) times daily.   MULTIPLE VITAMIN (MULTIVITAMIN) TABLET    Take 1 tablet by mouth every morning.    OXYCODONE-ACETAMINOPHEN (ROXICET) 5-325 MG PER TABLET    Take one tablet by mouth every four hours routinely; Take one tablet by mouth every 2 hours as needed for breakthrough pain   RIVAROXABAN (XARELTO) 10 MG TABS TABLET    Take 1 tablet (10 mg total) by mouth daily with breakfast.   SYNTHROID 88 MCG TABLET    Take 1 tablet (88 mcg total) by mouth daily before breakfast.  Modified Medications   No medications on file  Discontinued Medications   No medications on file     Physical Exam: Filed Vitals:   08/19/14 1005  BP: 118/65  Pulse: 89  Temp: 97.6 F (36.4 C)  Resp: 18  SpO2: 95%   General- elderly female in no acute distress Head- atraumatic, normocephalic Eyes- PERRLA, EOMI, no pallor, no icterus, no discharge Neck- no lymphadenopathy Throat- moist mucus membrane Cardiovascular- normal s1,s2, no murmurs/ rubs/ gallops Respiratory- bilateral clear to auscultation, no wheeze, no rhonchi, no crackles, no use of accessory muscles Abdomen- bowel sounds present, soft, non tender Musculoskeletal- able to move all 4 extremities, right knee pain, right knee dressing clean and dry, right leg has mild erythema and warmth, bruising extending upto thigh and 2+ edema and left leg has 1+ edema  Neurological- no focal deficit, aaox 3 Skin- warm and dry Psychiatry- normal mood and affect  Labs reviewed: Basic Metabolic Panel:  Recent Labs  02/20/14 1605 08/06/14 1503 08/13/14 1001  NA 139 140 143  K 4.0 4.3 3.7  CL 101 103 108  CO2 26 25 25   GLUCOSE 82 85 95  BUN 14 17 20   CREATININE 0.66 0.67 0.75  CALCIUM  9.4 9.5 8.8   Liver Function Tests:  Recent Labs  12/08/13 0751  AST 17  ALT 13  ALKPHOS 63  BILITOT 0.4  PROT 6.8  ALBUMIN 3.8   No results found for this basename: LIPASE, AMYLASE,  in the last 8760 hours No results found for this basename: AMMONIA,  in the last 8760 hours CBC:  Recent Labs  12/08/13 0751 02/20/14 1605 08/06/14 1503 08/15/14 0027  WBC 5.9 5.0 5.0 8.1  NEUTROABS 1.9 2.3  --   --   HGB 12.7 13.3 13.2 9.6*  HCT 38.3 37.9 39.5 27.9*  MCV 92.4 90.2 90.6 90.0  PLT 201.0 159 173 127*   CBG:  Recent Labs  02/21/14 0642 02/21/14 0756 02/21/14 1133  GLUCAP 75 81 77    Radiological Exams: Dg Chest 2 View  08/06/2014   CLINICAL DATA:  Preoperative chest radiograph for total knee arthroplasty.  EXAM: CHEST  2 VIEW  COMPARISON:  05/09/2010.  FINDINGS: Cardiopericardial silhouette within normal limits. Mediastinal contours normal. Trachea midline. No airspace disease or effusion. Chronic bronchitic changes appear similar to the prior study. Cholecystectomy clips are present in the right upper quadrant.  IMPRESSION: No active cardiopulmonary disease.   Electronically Signed   By: Dereck Ligas M.D.   On: 08/06/2014 15:39   Dg Knee Right Port  08/13/2014   CLINICAL DATA:  Status post arthroplasty  EXAM: PORTABLE RIGHT KNEE - 1-2 VIEW  COMPARISON:  None.  FINDINGS: Frontal and lateral views were obtained. There is evidence of total knee replacement with femoral and tibial components appearing well-seated. No fracture or dislocation. Soft tissue air is an expected postoperative finding.  IMPRESSION: Prosthetic components appear well seated. No fracture or dislocation apparent.   Electronically Signed   By: Lowella Grip M.D.   On: 08/13/2014 10:24    Assessment/Plan  Arthritis right knee  status post right knee replacement. Will have patient work with PT/OT as tolerated to regain strength and restore function.  Fall precautions are in place. Continue current  regimen percocet and robaxin with xarelto for dvt prophylaxis, has f/u with ortho 08/26/14. Monitor for worsening erythema or swelling. Keep legs elevated at rest. Continue calcium supplement  Edema Change lasix to 40 mg daily for now to help with swelling and thus mobility. Check bmp in a week  will continue lasix 40 mg daily as needed and will monitor   Hypothyroidism Continue levothyroxine 88 mcg daily  Anxiety Continue valium prn for anxiety  gerd Stable on prilosec 20 mg daily for now  Asthma  Stable. continue albuterol and monitor  Atrophic vaginitis Continue her estrace cresm  Family/ staff Communication: reviewed care plan with patient and nursing supervisor   Goals of care: short term rehabilitation    Labs/tests ordered: cbc, cmp    Blanchie Serve, MD  [facility] [phone] (Monday-Friday 8 am - 5 pm) [phone] (afterhours)

## 2014-08-30 ENCOUNTER — Non-Acute Institutional Stay (SKILLED_NURSING_FACILITY): Payer: Medicare Other | Admitting: Adult Health

## 2014-08-30 DIAGNOSIS — M129 Arthropathy, unspecified: Secondary | ICD-10-CM

## 2014-08-30 DIAGNOSIS — R600 Localized edema: Secondary | ICD-10-CM

## 2014-08-30 DIAGNOSIS — Z96651 Presence of right artificial knee joint: Secondary | ICD-10-CM

## 2014-08-30 DIAGNOSIS — M1711 Unilateral primary osteoarthritis, right knee: Secondary | ICD-10-CM

## 2014-09-14 ENCOUNTER — Ambulatory Visit (INDEPENDENT_AMBULATORY_CARE_PROVIDER_SITE_OTHER): Payer: Medicare Other | Admitting: Family Medicine

## 2014-09-14 ENCOUNTER — Encounter: Payer: Self-pay | Admitting: Family Medicine

## 2014-09-14 VITALS — BP 112/58 | HR 83 | Temp 98.6°F | Ht 66.75 in | Wt 155.8 lb

## 2014-09-14 DIAGNOSIS — D62 Acute posthemorrhagic anemia: Secondary | ICD-10-CM

## 2014-09-14 DIAGNOSIS — Z96651 Presence of right artificial knee joint: Secondary | ICD-10-CM

## 2014-09-14 DIAGNOSIS — R600 Localized edema: Secondary | ICD-10-CM

## 2014-09-14 LAB — CBC WITH DIFFERENTIAL/PLATELET
Basophils Absolute: 0 10*3/uL (ref 0.0–0.1)
Basophils Relative: 0.5 % (ref 0.0–3.0)
EOS PCT: 4.7 % (ref 0.0–5.0)
Eosinophils Absolute: 0.2 10*3/uL (ref 0.0–0.7)
HCT: 37 % (ref 36.0–46.0)
Hemoglobin: 11.7 g/dL — ABNORMAL LOW (ref 12.0–15.0)
Lymphocytes Relative: 32.9 % (ref 12.0–46.0)
Lymphs Abs: 1.7 10*3/uL (ref 0.7–4.0)
MCHC: 31.7 g/dL (ref 30.0–36.0)
MCV: 95.2 fl (ref 78.0–100.0)
MONOS PCT: 5.5 % (ref 3.0–12.0)
Monocytes Absolute: 0.3 10*3/uL (ref 0.1–1.0)
NEUTROS PCT: 56.4 % (ref 43.0–77.0)
Neutro Abs: 2.9 10*3/uL (ref 1.4–7.7)
Platelets: 176 10*3/uL (ref 150.0–400.0)
RBC: 3.88 Mil/uL (ref 3.87–5.11)
RDW: 15.1 % (ref 11.5–15.5)
WBC: 5.2 10*3/uL (ref 4.0–10.5)

## 2014-09-14 LAB — FERRITIN: Ferritin: 104 ng/mL (ref 10.0–291.0)

## 2014-09-14 NOTE — Progress Notes (Signed)
Pre visit review using our clinic review tool, if applicable. No additional management support is needed unless otherwise documented below in the visit note. 

## 2014-09-14 NOTE — Assessment & Plan Note (Signed)
This is much improved from surg/hosp Will continue lasix 40 mg Rev last labs

## 2014-09-14 NOTE — Progress Notes (Signed)
Subjective:    Patient ID: Carla Kelly, female    DOB: 1933-05-04, 80 y.o.   MRN: [id number]  HPI Here for f/u after R TKA and then rehab stay at [facility] 9/18 to 7/16  No complications with the surgery  Pt still hears a sound when she bends her knee -still some pain medially  Doing home PT exercises ( still has PT/ OT/nursing -and is staying with her daughter) Marena Chancy when she will go back to her house  Has appt with Dr Ninfa Linden on 10/29   No longer using walker    (uses only at night to get to the bathroom) Uses a cane when needed   Did gain 10 lb  bmi of 24  Had edema - inc her lasix to 40 mg bid for a while  She still thinks she needs it once daily Swelling is much better    Anemia - post op Lab Results  Component Value Date   WBC 8.1 08/15/2014   HGB 9.6* 08/15/2014   HCT 27.9* 08/15/2014   MCV 90.0 08/15/2014   PLT 127* 08/15/2014     Patient Active Problem List   Diagnosis Date Noted  . Acute blood loss anemia 09/14/2014  . Arthritis of right knee 08/13/2014  . Status post total right knee replacement 08/13/2014  . Amnesia 02/20/2014  . Transient global amnesia 02/20/2014  . Encounter for Medicare annual wellness exam 12/07/2013  . Allergic drug rash 03/16/2013  . Psoriasis 02/16/2013  . Other screening mammogram 08/19/2012  . Pedal edema 03/11/2012  . B12 DEFICIENCY 09/05/2009  . Unspecified vitamin D deficiency 11/08/2008  . HYPERLIPIDEMIA 08/25/2008  . OSTEOPENIA 08/25/2008  . Personal History of Colonic Polyps 08/25/2008  . HERPES ZOSTER, HX OF 06/03/2008  . HYPOTHYROIDISM 08/04/2007  . HYPOGLYCEMIA 08/04/2007  . ASTHMA 08/04/2007  . IRRITABLE BOWEL SYNDROME 08/04/2007  . VAGINITIS, ATROPHIC 08/04/2007  . Osteoarth NOS-Unspec 08/04/2007  . FIBROMYALGIA 08/04/2007  . URINARY INCONTINENCE 08/04/2007   Past Medical History  Diagnosis Date  . Hypothyroidism   . Osteoarthritis   . IBS (irritable bowel syndrome)   . Fibromyalgia   .  History of herpes zoster   . HLD (hyperlipidemia)   . Adenomatous colon polyp   . Other B-complex deficiencies   . Osteoarthrosis, unspecified whether generalized or localized, unspecified site   . Embolism and thrombosis of unspecified site   . Disorder of bone and cartilage, unspecified   . Unspecified vitamin D deficiency   . Unspecified urinary incontinence   . Mitral valve disorder     "leaking mitral valve-very mild"  . Swelling of extremity, right     right leg  . Psoriasis   . 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 Asthma   . Hypoglycemia    Past Surgical History  Procedure Laterality Date  . Abdominal hysterectomy  1970    fibroids; endometriosis  . Cervical disc surgery      x 2  . Colonoscopy  2/02    polyps, BE-polyps  . US transvaginal pelvic modified  10/02    small left ovarian cyst  . Bladder tack  5/05  . Cataract extraction Right 12/13    Dr George Ina  . Cholecystectomy  2004  . Mouth surgery      lower teeth have 4 studs to keep in place  . Eye surgery      right eye  . Total knee arthroplasty Right 08/13/2014    Procedure: RIGHT TOTAL  KNEE ARTHROPLASTY;  Surgeon: Mcarthur Rossetti, MD;  Location: [facility];  Service: Orthopedics;  Laterality: Right;   History  Substance Use Topics  . Smoking status: Never Smoker   . Smokeless tobacco: Never Used  . Alcohol Use: No     Comment: "is allergic"   Family History  Problem Relation Age of Onset  . Heart attack Mother   . Stroke Mother     several  . Lupus Brother   . Other Sister     benign brain tumor  . Asthma Daughter   . Other Brother     heart problems  . Colon cancer      1st cousin   Allergies  Allergen Reactions  . Alcohol-Sulfur [Sulfur] Shortness Of Breath and Swelling  . Ergocalciferol Other (See Comments)    REACTION: GI side effects  . Morphine Nausea And Vomiting  . Tolterodine Tartrate Other (See Comments)    REACTION: ? reaction  . Myrbetriq [Mirabegron] Other (See  Comments)    unknown  . Nortriptyline Hcl Other (See Comments)    unknown   Current Outpatient Prescriptions on File Prior to Visit  Medication Sig Dispense Refill  . albuterol (PROVENTIL HFA;VENTOLIN HFA) 108 (90 BASE) MCG/ACT inhaler Inhale 2 puffs into the lungs every 4 (four) hours as needed.        Marland Kitchen albuterol (PROVENTIL) 4 MG tablet Take 4 mg by mouth daily as needed for wheezing or shortness of breath.       . Calcium 1500 MG tablet Take 1,500 mg by mouth every morning.       . diazepam (VALIUM) 5 MG tablet Take 5-7.5 mg by mouth 2 (two) times daily as needed for anxiety or sedation.      Marland Kitchen estradiol (ESTRACE) 0.1 MG/GM vaginal cream Place 1 Applicatorful vaginally at bedtime.      . fluocinolone (DERMA-SMOOTHE/FS SCALP) 0.01 % external oil Apply topically 3 (three) times daily.  120 mL  1  . furosemide (LASIX) 40 MG tablet Take 40 mg by mouth daily as needed for edema.      . methocarbamol (ROBAXIN) 500 MG tablet Take 1 tablet (500 mg total) by mouth 4 (four) times daily.  40 tablet  1  . Multiple Vitamin (MULTIVITAMIN) tablet Take 1 tablet by mouth every morning.       Marland Kitchen oxyCODONE-acetaminophen (ROXICET) 5-325 MG per tablet Take one tablet by mouth every four hours routinely; Take one tablet by mouth every 2 hours as needed for breakthrough pain  360 tablet  0  . SYNTHROID 88 MCG tablet Take 1 tablet (88 mcg total) by mouth daily before breakfast.  90 tablet  1   No current facility-administered medications on file prior to visit.    Review of Systems Review of Systems  Constitutional: Negative for fever, appetite change, fatigue and unexpected weight change.  Eyes: Negative for pain and visual disturbance.  Respiratory: Negative for cough and shortness of breath.   Cardiovascular: Negative for cp or palpitations    Gastrointestinal: Negative for nausea, diarrhea and constipation.  Genitourinary: Negative for urgency and frequency.  Skin: Negative for pallor or rash   MSK pos  for knee pain and stiffness that is improving  Neurological: Negative for weakness, light-headedness, numbness and headaches.  Hematological: Negative for adenopathy. Does not bruise/bleed easily.  Psychiatric/Behavioral: Negative for dysphoric mood. The patient is not nervous/anxious.         Objective:   Physical Exam  Constitutional: She appears well-developed  and well-nourished. No distress.  HENT:  Head: Normocephalic and atraumatic.  Right Ear: External ear normal.  Left Ear: External ear normal.  Nose: Nose normal.  Mouth/Throat: Oropharynx is clear and moist.  Eyes: Conjunctivae and EOM are normal. Pupils are equal, round, and reactive to light. Right eye exhibits no discharge. Left eye exhibits no discharge. No scleral icterus.  Neck: Normal range of motion. Neck supple. No JVD present. Carotid bruit is not present. No thyromegaly present.  Cardiovascular: Normal rate, regular rhythm, normal heart sounds and intact distal pulses.  Exam reveals no gallop.   Pulmonary/Chest: Effort normal and breath sounds normal. No respiratory distress. She has no wheezes. She has no rales.  Abdominal: Soft. Bowel sounds are normal. She exhibits no distension.  Musculoskeletal: She exhibits tenderness. She exhibits no edema.  No pedal edema  Scar is healing well on R knee - limited rom and mildly tender   Lymphadenopathy:    She has no cervical adenopathy.  Neurological: She is alert. She has normal reflexes. No cranial nerve deficit. She exhibits normal muscle tone. Coordination normal.  Skin: Skin is warm and dry. No rash noted. No erythema. No pallor.  Psychiatric: She has a normal mood and affect.          Assessment & Plan:   Problem List Items Addressed This Visit     Other   Pedal edema     This is much improved from surg/hosp Will continue lasix 40 mg Rev last labs      Status post total right knee replacement - Primary     Doing well  Still some pain - is weaning down  on pain med Continues PT/OT at her daughter's house (is out of nursing home) Will transition to her own home Walks with cane only  Will f/u with Dr Ninfa Linden as planned     Acute blood loss anemia      S/p TKA on the R - about a month out  Still tired but otherwise doing well  Lab Results  Component Value Date   HCT 27.9* 08/15/2014    Re check today May need iron     Relevant Orders      CBC with Differential (Completed)      Ferritin (Completed)

## 2014-09-14 NOTE — Assessment & Plan Note (Signed)
S/p TKA on the R - about a month out  Still tired but otherwise doing well  Lab Results  Component Value Date   HCT 27.9* 08/15/2014    Re check today May need iron

## 2014-09-14 NOTE — Assessment & Plan Note (Signed)
Doing well  Still some pain - is weaning down on pain med Continues PT/OT at her daughter's house (is out of nursing home) Will transition to her own home Walks with cane only  Will f/u with Dr Ninfa Linden as planned

## 2014-09-14 NOTE — Patient Instructions (Signed)
Lab today for anemia (post operative)  If needed we can px some iron  Continue PT and OT  Wean down your pain medication as you can

## 2014-09-15 NOTE — Progress Notes (Signed)
Patient ID: Carla Kelly, female   DOB: 03/21/1933, 80 y.o.   MRN: [id number]     [facility]  Allergies  Allergen Reactions  . Alcohol-Sulfur [Sulfur] Shortness Of Breath and Swelling  . Ergocalciferol Other (See Comments)    REACTION: GI side effects  . Morphine Nausea And Vomiting  . Tolterodine Tartrate Other (See Comments)    REACTION: ? reaction  . Myrbetriq [Mirabegron] Other (See Comments)    unknown  . Nortriptyline Hcl Other (See Comments)    unknown     Chief Complaint  Patient presents with  . Discharge Note    HPI:  She is being discharged to home with home health for pt/ot/rn. She will need a front wheel walker. She will need her prescriptions to be written. She will need a follow up with her pcp. She had been hospitalized for a right knee replacement.   Past Medical History  Diagnosis Date  . Hypothyroidism   . Osteoarthritis   . IBS (irritable bowel syndrome)   . Fibromyalgia   . History of herpes zoster   . HLD (hyperlipidemia)   . Adenomatous colon polyp   . Other B-complex deficiencies   . Osteoarthrosis, unspecified whether generalized or localized, unspecified site   . Embolism and thrombosis of unspecified site   . Disorder of bone and cartilage, unspecified   . Unspecified vitamin D deficiency   . Unspecified urinary incontinence   . Mitral valve disorder     "leaking mitral valve-very mild"  . Swelling of extremity, right     right leg  . Psoriasis   . 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 Asthma   . Hypoglycemia     Past Surgical History  Procedure Laterality Date  . Abdominal hysterectomy  1970    fibroids; endometriosis  . Cervical disc surgery      x 2  . Colonoscopy  2/02    polyps, BE-polyps  . US transvaginal pelvic modified  10/02    small left ovarian cyst  . Bladder tack  5/05  . Cataract extraction Right 12/13    Dr George Ina  . Cholecystectomy  2004  . Mouth surgery      lower teeth have 4 studs to keep in  place  . Eye surgery      right eye  . Total knee arthroplasty Right 08/13/2014    Procedure: RIGHT TOTAL KNEE ARTHROPLASTY;  Surgeon: Mcarthur Rossetti, MD;  Location: [facility];  Service: Orthopedics;  Laterality: Right;    VITAL SIGNS BP 114/60  Pulse 72  Ht 5\' 6"  (1.676 m)  Wt 145 lb (65.772 kg)  BMI 23.41 kg/m2   Patient's Medications  New Prescriptions   No medications on file  Previous Medications   ALBUTEROL (PROVENTIL HFA;VENTOLIN HFA) 108 (90 BASE) MCG/ACT INHALER    Inhale 2 puffs into the lungs every 4 (four) hours as needed.     ALBUTEROL (PROVENTIL) 4 MG TABLET    Take 4 mg by mouth daily as needed for wheezing or shortness of breath.    ASPIRIN 325 MG TABLET    Take 325 mg by mouth daily.   CALCIUM 1500 MG TABLET    Take 1,500 mg by mouth every morning.    DIAZEPAM (VALIUM) 5 MG TABLET    Take 5-7.5 mg by mouth 2 (two) times daily as needed for anxiety or sedation.   ESTRADIOL (ESTRACE) 0.1 MG/GM VAGINAL CREAM    Place 1 Applicatorful vaginally at bedtime.  FLUOCINOLONE (DERMA-SMOOTHE/FS SCALP) 0.01 % EXTERNAL OIL    Apply topically 3 (three) times daily.   FUROSEMIDE (LASIX) 40 MG TABLET    Take 40 mg by mouth daily as needed for edema.   METHOCARBAMOL (ROBAXIN) 500 MG TABLET    Take 1 tablet (500 mg total) by mouth 4 (four) times daily.   MULTIPLE VITAMIN (MULTIVITAMIN) TABLET    Take 1 tablet by mouth every morning.    OXYCODONE-ACETAMINOPHEN (ROXICET) 5-325 MG PER TABLET    Take one tablet by mouth every four hours routinely; Take one tablet by mouth every 2 hours as needed for breakthrough pain   SYNTHROID 88 MCG TABLET    Take 1 tablet (88 mcg total) by mouth daily before breakfast.  Modified Medications   No medications on file  Discontinued Medications   No medications on file    SIGNIFICANT DIAGNOSTIC EXAMS   08-06-14: chest x-ray: No active cardiopulmonary disease.  08-13-14: right knee x-ray: Prosthetic components appear well seated. No fracture or  dislocation apparent.    LABS REVIEWED:   08-06-14: wbc 5.0; hgb 13.2; hct 39.5; mcv 90.6; plt 173; glucose 85; bun 17; creat 0.67; k+4.3; na++140 08-13-14: glucose 95; bun 20; creat 0.75; k+3.7; na++143 08-15-14: wbc 8.1; hgb 9.6; hct 27.9; mcv 90 plt 127    Review of Systems  Constitutional: Negative for malaise/fatigue.  Respiratory: Negative for cough and shortness of breath.   Cardiovascular: Positive for leg swelling. Negative for chest pain and palpitations.       Right leg swelling present is well managed   Gastrointestinal: Negative for heartburn, abdominal pain and constipation.  Musculoskeletal: Positive for joint pain and myalgias.       Has right knee pain present   Skin: no complaints Psychiatric/Behavioral: Negative for depression. The patient is not nervous/anxious.      Physical Exam  Constitutional: She is oriented to person, place, and time. She appears well-developed and well-nourished. No distress.  Neck: Neck supple. No JVD present. No thyromegaly present.  Cardiovascular: Normal rate, regular rhythm and normal heart sounds.   Respiratory: Effort normal and breath sounds normal. No respiratory distress. She has no wheezes.  GI: Soft. Bowel sounds are normal. She exhibits no distension. There is no tenderness.  Musculoskeletal:  Is able to move all extremities; is status post right knee replacement Has right knee swelling present; has right leg edema present   Neurological: She is alert and oriented to person, place, and time.  Skin: Skin is warm and dry. She is not diaphoretic.  Right knee incision line without signs of infection present   Psychiatric: She has a normal mood and affect.      ASSESSMENT/ PLAN:  Will discharge her to home with home health for pt/ot/rn to improve upon her strength; mobility; gait and independence with her adls' and disease management. She will need a front wheel walker in order to maintain her current level of independence  with her adls. She has a follow up scheduled with her [facility] 09-14-14 at 10:30 am. He prescriptions have been written for a 30 day supply of her medications with #30 tabs of percocet 5/325 mg   Time spent with patient 40 minutes.   Ok Edwards NP [facility]  Contact [phone] Monday through Friday 8am- 5pm  After hours call [phone]

## 2014-10-05 ENCOUNTER — Other Ambulatory Visit: Payer: Self-pay | Admitting: Adult Health

## 2014-10-07 ENCOUNTER — Other Ambulatory Visit: Payer: Self-pay | Admitting: Family Medicine

## 2014-10-18 ENCOUNTER — Other Ambulatory Visit: Payer: Self-pay | Admitting: Adult Health

## 2014-10-20 ENCOUNTER — Other Ambulatory Visit: Payer: Self-pay | Admitting: Adult Health

## 2014-11-06 ENCOUNTER — Other Ambulatory Visit: Payer: Self-pay | Admitting: Family Medicine

## 2014-12-29 ENCOUNTER — Other Ambulatory Visit: Payer: Self-pay | Admitting: Family Medicine

## 2015-01-14 ENCOUNTER — Other Ambulatory Visit: Payer: Self-pay | Admitting: Family Medicine

## 2015-01-14 NOTE — Telephone Encounter (Signed)
Px written for call in   

## 2015-01-14 NOTE — Telephone Encounter (Signed)
Last f/u 08/2014. Med list indicates course of valium completed. pls advise

## 2015-01-14 NOTE — Telephone Encounter (Signed)
Rx called in to requested pharmacy 

## 2015-02-02 ENCOUNTER — Other Ambulatory Visit: Payer: Self-pay

## 2015-02-02 DIAGNOSIS — Z1231 Encounter for screening mammogram for malignant neoplasm of breast: Secondary | ICD-10-CM

## 2015-02-08 ENCOUNTER — Other Ambulatory Visit: Payer: Medicare Other

## 2015-02-09 ENCOUNTER — Telehealth: Payer: Self-pay | Admitting: Family Medicine

## 2015-02-09 DIAGNOSIS — R600 Localized edema: Secondary | ICD-10-CM

## 2015-02-09 DIAGNOSIS — M8580 Other specified disorders of bone density and structure, unspecified site: Secondary | ICD-10-CM

## 2015-02-09 DIAGNOSIS — E039 Hypothyroidism, unspecified: Secondary | ICD-10-CM

## 2015-02-09 DIAGNOSIS — E785 Hyperlipidemia, unspecified: Secondary | ICD-10-CM

## 2015-02-09 DIAGNOSIS — E538 Deficiency of other specified B group vitamins: Secondary | ICD-10-CM

## 2015-02-09 DIAGNOSIS — E559 Vitamin D deficiency, unspecified: Secondary | ICD-10-CM

## 2015-02-09 DIAGNOSIS — D62 Acute posthemorrhagic anemia: Secondary | ICD-10-CM

## 2015-02-09 NOTE — Telephone Encounter (Signed)
-----   Message from Ellamae Sia sent at 02/04/2015 11:00 AM EST ----- Regarding: Lab orders for Thursday, 3.17.16 Patient is scheduled for CPX labs, please order future labs, Thanks , Karna Christmas

## 2015-02-10 ENCOUNTER — Ambulatory Visit (INDEPENDENT_AMBULATORY_CARE_PROVIDER_SITE_OTHER): Payer: Medicare Other | Admitting: Internal Medicine

## 2015-02-10 ENCOUNTER — Other Ambulatory Visit (INDEPENDENT_AMBULATORY_CARE_PROVIDER_SITE_OTHER): Payer: Medicare Other

## 2015-02-10 ENCOUNTER — Encounter: Payer: Self-pay | Admitting: Internal Medicine

## 2015-02-10 VITALS — BP 124/82 | HR 88 | Temp 98.1°F | Wt 151.0 lb

## 2015-02-10 DIAGNOSIS — E559 Vitamin D deficiency, unspecified: Secondary | ICD-10-CM

## 2015-02-10 DIAGNOSIS — J309 Allergic rhinitis, unspecified: Secondary | ICD-10-CM

## 2015-02-10 DIAGNOSIS — H6981 Other specified disorders of Eustachian tube, right ear: Secondary | ICD-10-CM

## 2015-02-10 DIAGNOSIS — E538 Deficiency of other specified B group vitamins: Secondary | ICD-10-CM

## 2015-02-10 DIAGNOSIS — M8580 Other specified disorders of bone density and structure, unspecified site: Secondary | ICD-10-CM

## 2015-02-10 DIAGNOSIS — E039 Hypothyroidism, unspecified: Secondary | ICD-10-CM

## 2015-02-10 DIAGNOSIS — R600 Localized edema: Secondary | ICD-10-CM

## 2015-02-10 DIAGNOSIS — D62 Acute posthemorrhagic anemia: Secondary | ICD-10-CM

## 2015-02-10 DIAGNOSIS — E785 Hyperlipidemia, unspecified: Secondary | ICD-10-CM

## 2015-02-10 LAB — COMPREHENSIVE METABOLIC PANEL
ALBUMIN: 4 g/dL (ref 3.5–5.2)
ALT: 18 U/L (ref 0–35)
AST: 30 U/L (ref 0–37)
Alkaline Phosphatase: 62 U/L (ref 39–117)
BILIRUBIN TOTAL: 0.2 mg/dL (ref 0.2–1.2)
BUN: 16 mg/dL (ref 6–23)
CALCIUM: 8.9 mg/dL (ref 8.4–10.5)
CHLORIDE: 104 meq/L (ref 96–112)
CO2: 33 mEq/L — ABNORMAL HIGH (ref 19–32)
Creatinine, Ser: 0.75 mg/dL (ref 0.40–1.20)
GFR: 79.01 mL/min (ref >60.00)
Glucose, Bld: 86 mg/dL (ref 70–99)
Potassium: 3.8 mEq/L (ref 3.5–5.1)
Sodium: 143 mEq/L (ref 135–145)
TOTAL PROTEIN: 6.9 g/dL (ref 6.0–8.3)

## 2015-02-10 LAB — LIPID PANEL
Cholesterol: 167 mg/dL (ref 0–200)
HDL: 65.5 mg/dL (ref >39.00)
LDL Cholesterol: 90 mg/dL (ref 0–99)
NonHDL: 101.5
TRIGLYCERIDES: 59 mg/dL (ref 0.0–149.0)
Total CHOL/HDL Ratio: 3
VLDL: 11.8 mg/dL (ref 0.0–40.0)

## 2015-02-10 LAB — TSH: TSH: 0.84 u[IU]/mL (ref 0.35–4.50)

## 2015-02-10 LAB — CBC WITH DIFFERENTIAL/PLATELET
Basophils Absolute: 0 10*3/uL (ref 0.0–0.1)
Basophils Relative: 0.5 % (ref 0.0–3.0)
Eosinophils Absolute: 0.1 10*3/uL (ref 0.0–0.7)
Eosinophils Relative: 4 % (ref 0.0–5.0)
HCT: 40.5 % (ref 36.0–46.0)
Hemoglobin: 13.5 g/dL (ref 12.0–15.0)
Lymphocytes Relative: 56.8 % — ABNORMAL HIGH (ref 12.0–46.0)
Lymphs Abs: 2.1 10*3/uL (ref 0.7–4.0)
MCHC: 33.3 g/dL (ref 30.0–36.0)
MCV: 91.1 fl (ref 78.0–100.0)
Monocytes Absolute: 0.2 10*3/uL (ref 0.1–1.0)
Monocytes Relative: 6.1 % (ref 3.0–12.0)
NEUTROS ABS: 1.2 10*3/uL — AB (ref 1.4–7.7)
Neutrophils Relative %: 32.6 % — ABNORMAL LOW (ref 43.0–77.0)
Platelets: 167 10*3/uL (ref 150.0–400.0)
RBC: 4.45 Mil/uL (ref 3.87–5.11)
RDW: 15.2 % (ref 11.5–15.5)
WBC: 3.7 10*3/uL — ABNORMAL LOW (ref 4.0–10.5)

## 2015-02-10 LAB — VITAMIN D 25 HYDROXY (VIT D DEFICIENCY, FRACTURES): VITD: 26.02 ng/mL — AB (ref 30.00–100.00)

## 2015-02-10 LAB — VITAMIN B12: VITAMIN B 12: 963 pg/mL — AB (ref 211–911)

## 2015-02-10 NOTE — Patient Instructions (Signed)
Barotitis Media Barotitis media is inflammation of your middle ear. This occurs when the auditory tube (eustachian tube) leading from the back of your nose (nasopharynx) to your eardrum is blocked. This blockage may result from a cold, environmental allergies, or an upper respiratory infection. Unresolved barotitis media may lead to damage or hearing loss (barotrauma), which may become permanent. HOME CARE INSTRUCTIONS   Use medicines as recommended by your health care provider. Over-the-counter medicines will help unblock the canal and can help during times of air travel.  Do not put anything into your ears to clean or unplug them. Eardrops will not be helpful.  Do not swim, dive, or fly until your health care provider says it is all right to do so. If these activities are necessary, chewing gum with frequent, forceful swallowing may help. It is also helpful to hold your nose and gently blow to pop your ears for equalizing pressure changes. This forces air into the eustachian tube.  Only take over-the-counter or prescription medicines for pain, discomfort, or fever as directed by your health care provider.  A decongestant may be helpful in decongesting the middle ear and make pressure equalization easier. SEEK MEDICAL CARE IF:  You experience a serious form of dizziness in which you feel as if the room is spinning and you feel nauseated (vertigo).  Your symptoms only involve one ear. SEEK IMMEDIATE MEDICAL CARE IF:   You develop a severe headache, dizziness, or severe ear pain.  You have bloody or pus-like drainage from your ears.  You develop a fever.  Your problems do not improve or become worse. MAKE SURE YOU:   Understand these instructions.  Will watch your condition.  Will get help right away if you are not doing well or get worse. Document Released: 11/09/2000 Document Revised: 09/02/2013 Document Reviewed: 06/09/2013 [employer] Patient Information 2015 [employer]. This  information is not intended to replace advice given to you by your health care provider. Make sure you discuss any questions you have with your health care provider.  

## 2015-02-10 NOTE — Progress Notes (Signed)
HPI  Pt presents to the clinic today with c/o ear pain, cough and chest congestion. She reports this started 4-5 days ago. The cough is productive of yellow blood tinged mucous. She has run fevers up to 101.5. She has tried her inhalers and aspirin without much relief. She reports she just got back from [place]. She does have a history of asthma. She has had sick contacts. She does not smoke. She has had her flu shot.  Review of Systems      Past Medical History  Diagnosis Date  . Hypothyroidism   . Osteoarthritis   . IBS (irritable bowel syndrome)   . Fibromyalgia   . History of herpes zoster   . HLD (hyperlipidemia)   . Adenomatous colon polyp   . Other B-complex deficiencies   . Osteoarthrosis, unspecified whether generalized or localized, unspecified site   . Embolism and thrombosis of unspecified site   . Disorder of bone and cartilage, unspecified   . Unspecified vitamin D deficiency   . Unspecified urinary incontinence   . Mitral valve disorder     "leaking mitral valve-very mild"  . Swelling of extremity, right     right leg  . Psoriasis   . 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 Asthma   . Hypoglycemia     Family History  Problem Relation Age of Onset  . Heart attack Mother   . Stroke Mother     several  . Lupus Brother   . Other Sister     benign brain tumor  . Asthma Daughter   . Other Brother     heart problems  . Colon cancer      1st cousin    History   Social History  . Marital Status: Widowed    Spouse Name: N/A  . Number of Children: 4  . Years of Education: N/A   Occupational History  . Retired    Social History Main Topics  . Smoking status: Never Smoker   . Smokeless tobacco: Never Used  . Alcohol Use: No     Comment: "is allergic"  . Drug Use: No  . Sexual Activity: Not on file   Other Topics Concern  . Not on file   Social History Narrative   Widowed      4 children      Retired      Regular exercise      Grandson  lives with her    Allergies  Allergen Reactions  . Alcohol-Sulfur [Sulfur] Shortness Of Breath and Swelling  . Ergocalciferol Other (See Comments)    REACTION: GI side effects  . Morphine Nausea And Vomiting  . Tolterodine Tartrate Other (See Comments)    REACTION: ? reaction  . Myrbetriq [Mirabegron] Other (See Comments)    unknown  . Nortriptyline Hcl Other (See Comments)    unknown     Constitutional: Positive fever. Denies headache, fatigue or abrupt weight changes.  HEENT:  Positive nasal congestion, ear pain and sore throat. Denies eye redness, eye pain, pressure behind the eyes, facial pain, ringing in the ears, wax buildup, runny nose or bloody nose. Respiratory: Positive cough. Denies difficulty breathing or shortness of breath.  Cardiovascular: Denies chest pain, chest tightness, palpitations or swelling in the hands or feet.   No other specific complaints in a complete review of systems (except as listed in HPI above).  Objective:   BP 124/82 mmHg  Pulse 88  Temp(Src) 98.1 F (36.7 C) (Oral)  Wt  151 lb (68.493 kg)  SpO2 98%  Wt Readings from Last 3 Encounters:  09/14/14 155 lb 12 oz (70.648 kg)  08/30/14 145 lb (65.772 kg)  08/17/14 145 lb (65.772 kg)     General: Appears her stated age, chronically ill appearing in NAD. HEENT: Head: normal shape and size, no sinus tenderness;Left Ears: tM graft; Right Ears: Tm's gray and intact, normal light reflex;Throat/Mouth: + PND. Teeth present, mucosa pink and moist, no exudate noted, no lesions or ulcerations noted.  Neck: No lymphadenopathy.   Cardiovascular: Normal rate and rhythm. S1,S2 noted.  No murmur, rubs or gallops noted.  Pulmonary/Chest: Normal effort and positive vesicular breath sounds. No respiratory distress. No wheezes, rales or ronchi noted.      Assessment & Plan:   Allergies and Eustachian Tube Dysfunction:  Start Flonase 1 spray each nostril BID x 3-5 days Ok to take Allegra OTC daily  prn  RTC as needed or if symptoms persist.

## 2015-02-10 NOTE — Progress Notes (Signed)
Pre visit review using our clinic review tool, if applicable. No additional management support is needed unless otherwise documented below in the visit note. 

## 2015-02-10 NOTE — Progress Notes (Signed)
Subjective:    Patient ID: Carla Kelly, female    DOB: 1933-02-08, 81 y.o.   MRN: [id number]  HPI Carla Kelly is a 81 year old female who presents today with chief complaint of ear pain and nasal congestion.  Symptoms began Sunday after returning from [facility].  Patient reports that her asthma was exacerbated by the dry air and the plane ride.  She does report a fever or 101.5 on Sunday, but none since then.  She says her main complaint is the ear pain.  She has not tried anything over the counter for it.  Her grandson recently had the flu but she has gotten her flu shot this year.     Review of Systems  Constitutional: Positive for fever. Negative for chills, activity change and fatigue.  HENT: Positive for congestion, ear pain and rhinorrhea. Negative for ear discharge, postnasal drip, sinus pressure and sore throat.   Respiratory: Negative for cough, shortness of breath and wheezing.   Cardiovascular: Negative for chest pain, palpitations and leg swelling.  Genitourinary: Negative.   Skin: Negative for color change, pallor, rash and wound.  Neurological: Negative for dizziness, light-headedness and headaches.   Past Medical History  Diagnosis Date  . Hypothyroidism   . Osteoarthritis   . IBS (irritable bowel syndrome)   . Fibromyalgia   . History of herpes zoster   . HLD (hyperlipidemia)   . Adenomatous colon polyp   . Other B-complex deficiencies   . Osteoarthrosis, unspecified whether generalized or localized, unspecified site   . Embolism and thrombosis of unspecified site   . Disorder of bone and cartilage, unspecified   . Unspecified vitamin D deficiency   . Unspecified urinary incontinence   . Mitral valve disorder     "leaking mitral valve-very mild"  . Swelling of extremity, right     right leg  . Psoriasis   . 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 Asthma   . Hypoglycemia    Family History  Problem Relation Age of Onset  . Heart attack Mother   . Stroke  Mother     several  . Lupus Brother   . Other Sister     benign brain tumor  . Asthma Daughter   . Other Brother     heart problems  . Colon cancer      1st cousin   Current Outpatient Prescriptions on File Prior to Visit  Medication Sig Dispense Refill  . albuterol (PROVENTIL) 4 MG tablet TAKE 1 TABLET BY MOUTH EVERY 4 HOURS AS NEEDED. 120 tablet 1  . aspirin 325 MG tablet Take 325 mg by mouth daily.    . Calcium 1500 MG tablet Take 1,500 mg by mouth every morning.     . diazepam (VALIUM) 5 MG tablet TAKE 1 TABLET BY MOUTH 3 TIMES A DAY AS NEEDED 90 tablet 0  . estradiol (ESTRACE) 0.1 MG/GM vaginal cream Place 1 Applicatorful vaginally at bedtime.    . furosemide (LASIX) 40 MG tablet TAKE 1 TABLET BY MOUTH EVERY DAY 30 tablet 5  . Multiple Vitamin (MULTIVITAMIN) tablet Take 1 tablet by mouth every morning.     Marland Kitchen PROAIR HFA 108 (90 BASE) MCG/ACT inhaler INHALE 2 PUFFS EVERY 4 HOURS AS NEEDED 8.5 each 5  . SYNTHROID 88 MCG tablet Take 1 tablet (88 mcg total) by mouth daily before breakfast. 90 tablet 1   No current facility-administered medications on file prior to visit.  Objective:   Physical Exam  Constitutional: She is oriented to person, place, and time. She appears well-developed and well-nourished.  HENT:  Head: Normocephalic and atraumatic.  Right Ear: External ear and ear canal normal. No drainage or swelling. Tympanic membrane is not injected and not scarred. A middle ear effusion is present.  Left Ear: Tympanic membrane is scarred.  Patient has reconstructed TM on left side.  Right TM is opalescent and non bulging.  There is a small amount of blood at 7 o'clock on right TM.    Neck: Normal range of motion. Neck supple.  Cardiovascular: Normal rate, regular rhythm and normal heart sounds.   Pulmonary/Chest: Effort normal and breath sounds normal.  Musculoskeletal: Normal range of motion.  Neurological: She is alert and oriented to person, place, and time.    Skin: Skin is warm and dry.  Psychiatric: She has a normal mood and affect.    BP 124/82 mmHg  Pulse 88  Temp(Src) 98.1 F (36.7 C) (Oral)  Wt 151 lb (68.493 kg)  SpO2 98%       Assessment & Plan:  1. Seasonal allergies Instructed patient to use Flonase daily and zyrtec daily.  Follow up with office if symptoms worsen or if fever develops.   2. Eustachian tube dysfunction The use of flonase should help drain fluid. Can use tylenol as needed for pain.

## 2015-02-14 ENCOUNTER — Ambulatory Visit: Payer: Self-pay

## 2015-02-15 ENCOUNTER — Ambulatory Visit (INDEPENDENT_AMBULATORY_CARE_PROVIDER_SITE_OTHER): Payer: Medicare Other | Admitting: Family Medicine

## 2015-02-15 ENCOUNTER — Encounter: Payer: Self-pay | Admitting: Family Medicine

## 2015-02-15 VITALS — BP 122/68 | HR 95 | Temp 98.1°F | Ht 66.5 in | Wt 149.8 lb

## 2015-02-15 DIAGNOSIS — E2839 Other primary ovarian failure: Secondary | ICD-10-CM | POA: Insufficient documentation

## 2015-02-15 DIAGNOSIS — E039 Hypothyroidism, unspecified: Secondary | ICD-10-CM | POA: Diagnosis not present

## 2015-02-15 DIAGNOSIS — M8580 Other specified disorders of bone density and structure, unspecified site: Secondary | ICD-10-CM

## 2015-02-15 DIAGNOSIS — E785 Hyperlipidemia, unspecified: Secondary | ICD-10-CM

## 2015-02-15 DIAGNOSIS — Z0000 Encounter for general adult medical examination without abnormal findings: Secondary | ICD-10-CM

## 2015-02-15 DIAGNOSIS — Z1211 Encounter for screening for malignant neoplasm of colon: Secondary | ICD-10-CM | POA: Insufficient documentation

## 2015-02-15 DIAGNOSIS — E538 Deficiency of other specified B group vitamins: Secondary | ICD-10-CM

## 2015-02-15 DIAGNOSIS — Z23 Encounter for immunization: Secondary | ICD-10-CM

## 2015-02-15 DIAGNOSIS — E559 Vitamin D deficiency, unspecified: Secondary | ICD-10-CM

## 2015-02-15 NOTE — Patient Instructions (Signed)
prevnar vaccine today  Please do stool cards for colon cancer screening  Stop at check out for referral for bone density test  Don't forget to schedule your mammogram  Increase your daily vitamin D by 2000 iu daily (extra)

## 2015-02-15 NOTE — Progress Notes (Signed)
Pre visit review using our clinic review tool, if applicable. No additional management support is needed unless otherwise documented below in the visit note. 

## 2015-02-15 NOTE — Progress Notes (Signed)
Subjective:    Patient ID: Carla Kelly, female    DOB: 11-12-1933, 81 y.o.   MRN: [id number]  HPI Here for annual medicare wellness visit as well as chronic/acute medical problems   I have personally reviewed the Medicare Annual Wellness questionnaire and have noted 1. The patient's medical and social history 2. Their use of alcohol, tobacco or illicit drugs 3. Their current medications and supplements 4. The patient's functional ability including ADL's, fall risks, home safety risks and hearing or visual             impairment. 5. Diet and physical activities 6. Evidence for depression or mood disorders  The patients weight, height, BMI have been recorded in the chart and visual acuity is per eye clinic.  I have made referrals, counseling and provided education to the patient based review of the above and I have provided the pt with a written personalized care plan for preventive services.  Recently had the flu- but feeling better  Ears are still bothering her  Some sharp pain in R , L one better   See scanned forms.  Routine anticipatory guidance given to patient.  See health maintenance. Colon cancer screening colonosc 7/11 - she thinks this was 30 y recall  Breast cancer screening mammogram 1/15 - had it scheduled/ had to cancel and plans to re schedule  Self breast exam-no lumps or changes  Flu vaccine - did not get this year (does not think)  Tetanus vaccine 1/14  Pneumovax 9/09 , wants to get prevnar today  Zoster vaccine - declines  dexa 2012 Advance directive - she has living will and power of attorney already  Cognitive function addressed- see scanned forms- and if abnormal then additional documentation follows.  No concerns about memory -occ misplaces things   PMH and SH reviewed  Meds, vitals, and allergies reviewed.   ROS: See HPI.  Otherwise negative.    Wt is down 2 lb with bmi of 23- because she was sick   Osteopenia dexa 2/12 - wants to do that  D  level is low at 26- ? If she has not been taking regularly   B12 level is 963  Lab Results  Component Value Date   WBC 3.7* 02/10/2015   HGB 13.5 02/10/2015   HCT 40.5 02/10/2015   MCV 91.1 02/10/2015   PLT 167.0 02/10/2015    Hypothyroidism  Pt has no clinical changes No change in energy level/ hair or skin/ edema and no tremor Lab Results  Component Value Date   TSH 0.84 02/10/2015       Chemistry      Component Value Date/Time   NA 143 02/10/2015 0828   K 3.8 02/10/2015 0828   CL 104 02/10/2015 0828   CO2 33* 02/10/2015 0828   BUN 16 02/10/2015 0828   CREATININE 0.75 02/10/2015 0828      Component Value Date/Time   CALCIUM 8.9 02/10/2015 0828   ALKPHOS 62 02/10/2015 0828   AST 30 02/10/2015 0828   ALT 18 02/10/2015 0828   BILITOT 0.2 02/10/2015 0828      Lab Results  Component Value Date   CHOL 167 02/10/2015   CHOL 182 02/21/2014   CHOL 234* 12/08/2013   Lab Results  Component Value Date   HDL 65.50 02/10/2015   HDL 58 02/21/2014   HDL 66.70 12/08/2013   Lab Results  Component Value Date   LDLCALC 90 02/10/2015   LDLCALC 110* 02/21/2014  LDLCALC 120* 09/13/2010   Lab Results  Component Value Date   TRIG 59.0 02/10/2015   TRIG 68 02/21/2014   TRIG 86.0 12/08/2013   Lab Results  Component Value Date   CHOLHDL 3 02/10/2015   CHOLHDL 3.1 02/21/2014   CHOLHDL 4 12/08/2013   Lab Results  Component Value Date   LDLDIRECT 149.5 12/08/2013   LDLDIRECT 111.0 12/04/2012   LDLDIRECT 128.3 09/17/2011    Cholesterol looks very good / eating lots of fruit and vegetables    Patient Active Problem List   Diagnosis Date Noted  . Acute blood loss anemia 09/14/2014  . Arthritis of right knee 08/13/2014  . Status post total right knee replacement 08/13/2014  . Amnesia 02/20/2014  . Transient global amnesia 02/20/2014  . Encounter for Medicare annual wellness exam 12/07/2013  . Allergic drug rash 03/16/2013  . Psoriasis 02/16/2013  . Other  screening mammogram 08/19/2012  . Pedal edema 03/11/2012  . B12 deficiency 09/05/2009  . Vitamin D deficiency 11/08/2008  . Hyperlipidemia 08/25/2008  . Osteopenia 08/25/2008  . Personal History of Colonic Polyps 08/25/2008  . HERPES ZOSTER, HX OF 06/03/2008  . Hypothyroidism 08/04/2007  . HYPOGLYCEMIA 08/04/2007  . ASTHMA 08/04/2007  . IRRITABLE BOWEL SYNDROME 08/04/2007  . VAGINITIS, ATROPHIC 08/04/2007  . Osteoarth NOS-Unspec 08/04/2007  . FIBROMYALGIA 08/04/2007  . URINARY INCONTINENCE 08/04/2007   Past Medical History  Diagnosis Date  . Hypothyroidism   . Osteoarthritis   . IBS (irritable bowel syndrome)   . Fibromyalgia   . History of herpes zoster   . HLD (hyperlipidemia)   . Adenomatous colon polyp   . Other B-complex deficiencies   . Osteoarthrosis, unspecified whether generalized or localized, unspecified site   . Embolism and thrombosis of unspecified site   . Disorder of bone and cartilage, unspecified   . Unspecified vitamin D deficiency   . Unspecified urinary incontinence   . Mitral valve disorder     "leaking mitral valve-very mild"  . Swelling of extremity, right     right leg  . Psoriasis   . 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 Asthma   . Hypoglycemia    Past Surgical History  Procedure Laterality Date  . Abdominal hysterectomy  1970    fibroids; endometriosis  . Cervical disc surgery      x 2  . Colonoscopy  2/02    polyps, BE-polyps  . US transvaginal pelvic modified  10/02    small left ovarian cyst  . Bladder tack  5/05  . Cataract extraction Right 12/13    Dr George Ina  . Cholecystectomy  2004  . Mouth surgery      lower teeth have 4 studs to keep in place  . Eye surgery      right eye  . Total knee arthroplasty Right 08/13/2014    Procedure: RIGHT TOTAL KNEE ARTHROPLASTY;  Surgeon: Mcarthur Rossetti, MD;  Location: [facility];  Service: Orthopedics;  Laterality: Right;   History  Substance Use Topics  . Smoking status: Never  Smoker   . Smokeless tobacco: Never Used  . Alcohol Use: No     Comment: "is allergic"   Family History  Problem Relation Age of Onset  . Heart attack Mother   . Stroke Mother     several  . Lupus Brother   . Other Sister     benign brain tumor  . Asthma Daughter   . Other Brother     heart problems  .  Colon cancer      1st cousin   Allergies  Allergen Reactions  . Alcohol-Sulfur [Sulfur] Shortness Of Breath and Swelling  . Ergocalciferol Other (See Comments)    REACTION: GI side effects  . Morphine Nausea And Vomiting  . Tolterodine Tartrate Other (See Comments)    REACTION: ? reaction  . Myrbetriq [Mirabegron] Other (See Comments)    unknown  . Nortriptyline Hcl Other (See Comments)    unknown   Current Outpatient Prescriptions on File Prior to Visit  Medication Sig Dispense Refill  . albuterol (PROVENTIL) 4 MG tablet TAKE 1 TABLET BY MOUTH EVERY 4 HOURS AS NEEDED. 120 tablet 1  . aspirin 325 MG tablet Take 325 mg by mouth daily.    . Calcium 1500 MG tablet Take 1,500 mg by mouth every morning.     . diazepam (VALIUM) 5 MG tablet TAKE 1 TABLET BY MOUTH 3 TIMES A DAY AS NEEDED 90 tablet 0  . estradiol (ESTRACE) 0.1 MG/GM vaginal cream Place 1 Applicatorful vaginally at bedtime.    . furosemide (LASIX) 40 MG tablet TAKE 1 TABLET BY MOUTH EVERY DAY 30 tablet 5  . Multiple Vitamin (MULTIVITAMIN) tablet Take 1 tablet by mouth every morning.     Marland Kitchen PROAIR HFA 108 (90 BASE) MCG/ACT inhaler INHALE 2 PUFFS EVERY 4 HOURS AS NEEDED 8.5 each 5  . SYNTHROID 88 MCG tablet Take 1 tablet (88 mcg total) by mouth daily before breakfast. 90 tablet 1   No current facility-administered medications on file prior to visit.    Review of Systems    Review of Systems  Constitutional: Negative for fever, appetite change, fatigue and unexpected weight change.  Eyes: Negative for pain and visual disturbance.  Respiratory: Negative for cough and shortness of breath.   Cardiovascular: Negative  for cp or palpitations    Gastrointestinal: Negative for nausea, diarrhea and constipation.  Genitourinary: Negative for urgency and frequency.  Skin: Negative for pallor or rash   Neurological: Negative for weakness, light-headedness, numbness and headaches.  Hematological: Negative for adenopathy. Does not bruise/bleed easily.  Psychiatric/Behavioral: Negative for dysphoric mood. The patient is not nervous/anxious.      Objective:   Physical Exam  Constitutional: She appears well-developed and well-nourished. No distress.  HENT:  Head: Normocephalic and atraumatic.  Right Ear: External ear normal.  Left Ear: External ear normal.  Nose: Nose normal.  Mouth/Throat: Oropharynx is clear and moist.  Eyes: Conjunctivae and EOM are normal. Pupils are equal, round, and reactive to light. Right eye exhibits no discharge. Left eye exhibits no discharge. No scleral icterus.  Neck: Normal range of motion. Neck supple. No JVD present. Carotid bruit is not present. No thyromegaly present.  Cardiovascular: Normal rate, regular rhythm, normal heart sounds and intact distal pulses.  Exam reveals no gallop.   Pulmonary/Chest: Effort normal and breath sounds normal. No respiratory distress. She has no wheezes. She has no rales.  Abdominal: Soft. Bowel sounds are normal. She exhibits no distension and no mass. There is no tenderness.  Genitourinary:  Breast exam: No mass, nodules, thickening, tenderness, bulging, retraction, inflamation, nipple discharge or skin changes noted.  No axillary or clavicular LA.      Musculoskeletal: She exhibits no edema or tenderness.  Mild kyphosis  Lymphadenopathy:    She has no cervical adenopathy.  Neurological: She is alert. She has normal reflexes. No cranial nerve deficit. She exhibits normal muscle tone. Coordination normal.  Skin: Skin is warm and dry. No rash noted.  No erythema. No pallor.  Psychiatric: She has a normal mood and affect.          Assessment  & Plan:   Problem List Items Addressed This Visit      Digestive   B12 deficiency    Level is nl with current supplementation  Will continue to follow         Endocrine   Hypothyroidism    Hypothyroidism  Pt has no clinical changes No change in energy level/ hair or skin/ edema and no tremor Lab Results  Component Value Date   TSH 0.84 02/10/2015            Musculoskeletal and Integument   Osteopenia    Disc need for calcium/ vitamin D/ wt bearing exercise and bone density test every 2 y to monitor Disc safety/ fracture risk in detail  Refer for f/u dexa          Other   Colon cancer screening    Do not recommend colonosc at her age  Given IFOB cards       Relevant Orders   Fecal occult blood, imunochemical   Encounter for Medicare annual wellness exam - Primary    Reviewed health habits including diet and exercise and skin cancer prevention Reviewed appropriate screening tests for age  Also reviewed health mt list, fam hx and immunization status , as well as social and family history   See HPI Labs rev prevnar vaccine today  Please do stool cards for colon cancer screening  Stop at check out for referral for bone density test  Don't forget to schedule your mammogram  Increase your daily vitamin D by 2000 iu daily (extra)      Estrogen deficiency   Relevant Orders   DG Bone Density   Hyperlipidemia    Disc goals for lipids and reasons to control them Rev labs with pt Rev low sat fat diet in detail Well controlled       Vitamin D deficiency    Level not at goal for pt with bone loss Disc imp to bone and general health  Will double her otc dose daily       Other Visit Diagnoses    Need for prophylactic vaccination against Streptococcus pneumoniae (pneumococcus)        Relevant Orders    Pneumococcal conjugate vaccine 13-valent IM (Completed)

## 2015-02-17 NOTE — Assessment & Plan Note (Signed)
Disc goals for lipids and reasons to control them Rev labs with pt Rev low sat fat diet in detail Well controlled

## 2015-02-17 NOTE — Assessment & Plan Note (Signed)
Do not recommend colonosc at her age  Given IFOB cards

## 2015-02-17 NOTE — Assessment & Plan Note (Signed)
Level is nl with current supplementation  Will continue to follow

## 2015-02-17 NOTE — Assessment & Plan Note (Signed)
Reviewed health habits including diet and exercise and skin cancer prevention Reviewed appropriate screening tests for age  Also reviewed health mt list, fam hx and immunization status , as well as social and family history   See HPI Labs rev prevnar vaccine today  Please do stool cards for colon cancer screening  Stop at check out for referral for bone density test  Don't forget to schedule your mammogram  Increase your daily vitamin D by 2000 iu daily (extra)

## 2015-02-17 NOTE — Assessment & Plan Note (Signed)
Disc need for calcium/ vitamin D/ wt bearing exercise and bone density test every 2 y to monitor Disc safety/ fracture risk in detail  Refer for f/u dexa

## 2015-02-17 NOTE — Assessment & Plan Note (Signed)
Hypothyroidism  Pt has no clinical changes No change in energy level/ hair or skin/ edema and no tremor Lab Results  Component Value Date   TSH 0.84 02/10/2015

## 2015-02-17 NOTE — Assessment & Plan Note (Signed)
Level not at goal for pt with bone loss Disc imp to bone and general health  Will double her otc dose daily

## 2015-02-22 ENCOUNTER — Telehealth: Payer: Self-pay

## 2015-02-22 MED ORDER — AMOXICILLIN 875 MG PO TABS: 875.0000 mg | ORAL_TABLET | Freq: Two times a day (BID) | ORAL | Status: DC

## 2015-02-22 NOTE — Telephone Encounter (Signed)
Any fevers?  sxs ongoing since at least 3/13.  May treat with amox course.  If persistent sxs despite treatment rec eval in office.

## 2015-02-22 NOTE — Telephone Encounter (Signed)
Pt left v/m; pt was seen on 02/10/15 and 02/15/15. Pt continues with h/a and earache; pt thinks needs abx. [facility].

## 2015-02-22 NOTE — Telephone Encounter (Signed)
Spoke with patient. No fever currently. Will take abx and come in if no better after completion.

## 2015-03-03 ENCOUNTER — Ambulatory Visit
Admission: RE | Admit: 2015-03-03 | Discharge: 2015-03-03 | Disposition: A | Discharge status: Home / Self Care | Payer: Medicare Other | Source: Ambulatory Visit | Attending: Family Medicine | Admitting: Family Medicine

## 2015-03-03 ENCOUNTER — Ambulatory Visit
Admission: RE | Admit: 2015-03-03 | Discharge: 2015-03-03 | Disposition: A | Discharge status: Home / Self Care | Payer: Medicare Other | Source: Ambulatory Visit

## 2015-03-03 DIAGNOSIS — Z1231 Encounter for screening mammogram for malignant neoplasm of breast: Secondary | ICD-10-CM

## 2015-03-03 DIAGNOSIS — E2839 Other primary ovarian failure: Secondary | ICD-10-CM

## 2015-03-07 ENCOUNTER — Telehealth: Payer: Self-pay | Admitting: Family Medicine

## 2015-03-07 DIAGNOSIS — N6320 Unspecified lump in the left breast, unspecified quadrant: Secondary | ICD-10-CM

## 2015-03-07 NOTE — Telephone Encounter (Signed)
Pt called and has a lump in her left breast and needs a diagnostic MM and also an ultrasound before [facility] will schedule.  Please place the orders and we will get pt scheduled. Thank you.

## 2015-03-07 NOTE — Telephone Encounter (Signed)
Orders done

## 2015-03-10 ENCOUNTER — Other Ambulatory Visit: Payer: Self-pay | Admitting: Family Medicine

## 2015-03-10 ENCOUNTER — Ambulatory Visit
Admission: RE | Admit: 2015-03-10 | Discharge: 2015-03-10 | Disposition: A | Discharge status: Home / Self Care | Payer: Medicare Other | Source: Ambulatory Visit | Attending: Family Medicine | Admitting: Family Medicine

## 2015-03-10 DIAGNOSIS — N6320 Unspecified lump in the left breast, unspecified quadrant: Secondary | ICD-10-CM

## 2015-03-15 NOTE — Op Note (Signed)
PATIENT NAME:  Carla Kelly, Carla Kelly MR#:  [id number] DATE OF BIRTH:  05-05-1933  DATE OF PROCEDURE:  10/28/2012  PREOPERATIVE DIAGNOSIS: Visually significant cataract of the right eye.   POSTOPERATIVE DIAGNOSIS: Visually significant cataract of the right eye.   OPERATIVE PROCEDURE: Cataract extraction by phacoemulsification with implant of intraocular lens to right eye.   SURGEON: Birder Robson, MD.   ANESTHESIA:  1. Managed anesthesia care.  2. Topical tetracaine drops followed by 2% Xylocaine jelly applied in the preoperative holding area.   COMPLICATIONS: None.   TECHNIQUE:  Stop and chop.   DESCRIPTION OF PROCEDURE: The patient was examined and consented in the preoperative holding area where the aforementioned topical anesthesia was applied to the right eye and then brought back to the Operating Room where the right eye was prepped and draped in the usual sterile ophthalmic fashion and a lid speculum was placed. A paracentesis was created with the side port blade and the anterior chamber was filled with viscoelastic. A near clear corneal incision was performed with the steel keratome. A continuous curvilinear capsulorrhexis was performed with a cystotome followed by the capsulorrhexis forceps. Hydrodissection and hydrodelineation were carried out with BSS on a blunt cannula. The lens was removed in a stop and chop technique and the remaining cortical material was removed with the irrigation-aspiration handpiece. The capsular bag was inflated with viscoelastic and the Tecnis ZCB00 20.0-diopter lens, serial number [id number] was placed in the capsular bag without complication. The remaining viscoelastic was removed from the eye with the irrigation-aspiration handpiece. The wounds were hydrated. The anterior chamber was flushed with Miostat and the eye was inflated to physiologic pressure. The wounds were found to be water tight. The eye was dressed with Vigamox. The patient was given protective  glasses to wear throughout the day and a shield with which to sleep tonight. The patient was also given drops with which to begin a drop regimen today and will follow-up with me in one day.  ____________________________ Livingston Diones. Riely Baskett, MD wlp:slb D: 10/28/2012 20:15:35 ET T: 10/29/2012 08:36:13 ET JOB#: [id number]  cc: Mikaiah Stoffer L. Aylla Huffine, MD, <Dictator> Livingston Diones Netty Sullivant MD ELECTRONICALLY SIGNED 10/30/2012 15:45

## 2015-03-17 ENCOUNTER — Ambulatory Visit
Admission: RE | Admit: 2015-03-17 | Discharge: 2015-03-17 | Disposition: A | Discharge status: Home / Self Care | Payer: Medicare Other | Source: Ambulatory Visit | Attending: Family Medicine | Admitting: Family Medicine

## 2015-03-17 DIAGNOSIS — N6320 Unspecified lump in the left breast, unspecified quadrant: Secondary | ICD-10-CM

## 2015-03-17 HISTORY — PX: BREAST CYST ASPIRATION: SHX578

## 2015-03-21 ENCOUNTER — Other Ambulatory Visit: Payer: Self-pay | Admitting: Family Medicine

## 2015-03-21 NOTE — Telephone Encounter (Signed)
Px written for call in   

## 2015-03-21 NOTE — Telephone Encounter (Signed)
Received refill request electronically from pharmacy. Last refill 01/14/15 #90, last office visit 02/15/15. Is it okay to refill medication?

## 2015-03-22 NOTE — Telephone Encounter (Signed)
Rx called in as prescribed 

## 2015-04-15 ENCOUNTER — Encounter: Payer: Self-pay | Admitting: Gastroenterology

## 2015-05-04 ENCOUNTER — Encounter: Payer: Self-pay | Admitting: Gastroenterology

## 2015-05-05 ENCOUNTER — Encounter
Admission: RE | Admit: 2015-05-05 | Discharge: 2015-05-05 | Disposition: A | Discharge status: Home / Self Care | Payer: Medicare Other | Source: Ambulatory Visit | Attending: Ophthalmology | Admitting: Ophthalmology

## 2015-05-05 DIAGNOSIS — Z9049 Acquired absence of other specified parts of digestive tract: Secondary | ICD-10-CM | POA: Diagnosis not present

## 2015-05-05 DIAGNOSIS — F329 Major depressive disorder, single episode, unspecified: Secondary | ICD-10-CM | POA: Diagnosis not present

## 2015-05-05 DIAGNOSIS — Z91048 Other nonmedicinal substance allergy status: Secondary | ICD-10-CM | POA: Insufficient documentation

## 2015-05-05 DIAGNOSIS — J45909 Unspecified asthma, uncomplicated: Secondary | ICD-10-CM | POA: Insufficient documentation

## 2015-05-05 DIAGNOSIS — Z01812 Encounter for preprocedural laboratory examination: Secondary | ICD-10-CM | POA: Insufficient documentation

## 2015-05-05 DIAGNOSIS — H269 Unspecified cataract: Secondary | ICD-10-CM | POA: Diagnosis not present

## 2015-05-05 DIAGNOSIS — Z9889 Other specified postprocedural states: Secondary | ICD-10-CM | POA: Diagnosis not present

## 2015-05-05 DIAGNOSIS — Z885 Allergy status to narcotic agent status: Secondary | ICD-10-CM | POA: Insufficient documentation

## 2015-05-05 DIAGNOSIS — Z8673 Personal history of transient ischemic attack (TIA), and cerebral infarction without residual deficits: Secondary | ICD-10-CM | POA: Insufficient documentation

## 2015-05-05 HISTORY — DX: Major depressive disorder, single episode, unspecified: F32.9

## 2015-05-05 HISTORY — DX: Depression, unspecified: F32.A

## 2015-05-05 HISTORY — DX: Cerebral infarction, unspecified: I63.9

## 2015-05-05 LAB — POTASSIUM: POTASSIUM: 4.4 mmol/L (ref 3.5–5.1)

## 2015-05-10 ENCOUNTER — Encounter: Payer: Self-pay | Admitting: Ophthalmology

## 2015-05-10 ENCOUNTER — Encounter
Admission: RE | Disposition: A | Discharge status: Home / Self Care | Payer: Medicare Other | Source: Ambulatory Visit | Attending: Ophthalmology

## 2015-05-10 ENCOUNTER — Ambulatory Visit: Payer: Medicare Other | Admitting: Certified Registered Nurse Anesthetist

## 2015-05-10 ENCOUNTER — Ambulatory Visit
Admission: RE | Admit: 2015-05-10 | Discharge: 2015-05-10 | Disposition: A | Discharge status: Home / Self Care | Payer: Medicare Other | Source: Ambulatory Visit | Attending: Ophthalmology | Admitting: Ophthalmology

## 2015-05-10 DIAGNOSIS — Z8673 Personal history of transient ischemic attack (TIA), and cerebral infarction without residual deficits: Secondary | ICD-10-CM | POA: Diagnosis not present

## 2015-05-10 DIAGNOSIS — Z9049 Acquired absence of other specified parts of digestive tract: Secondary | ICD-10-CM | POA: Insufficient documentation

## 2015-05-10 DIAGNOSIS — Z7982 Long term (current) use of aspirin: Secondary | ICD-10-CM | POA: Insufficient documentation

## 2015-05-10 DIAGNOSIS — Z90710 Acquired absence of both cervix and uterus: Secondary | ICD-10-CM | POA: Diagnosis not present

## 2015-05-10 DIAGNOSIS — L409 Psoriasis, unspecified: Secondary | ICD-10-CM | POA: Insufficient documentation

## 2015-05-10 DIAGNOSIS — Z96659 Presence of unspecified artificial knee joint: Secondary | ICD-10-CM | POA: Diagnosis not present

## 2015-05-10 DIAGNOSIS — Z981 Arthrodesis status: Secondary | ICD-10-CM | POA: Insufficient documentation

## 2015-05-10 DIAGNOSIS — H2512 Age-related nuclear cataract, left eye: Secondary | ICD-10-CM | POA: Diagnosis not present

## 2015-05-10 DIAGNOSIS — Z79899 Other long term (current) drug therapy: Secondary | ICD-10-CM | POA: Insufficient documentation

## 2015-05-10 DIAGNOSIS — M1990 Unspecified osteoarthritis, unspecified site: Secondary | ICD-10-CM | POA: Diagnosis not present

## 2015-05-10 DIAGNOSIS — J45909 Unspecified asthma, uncomplicated: Secondary | ICD-10-CM | POA: Insufficient documentation

## 2015-05-10 DIAGNOSIS — Z79891 Long term (current) use of opiate analgesic: Secondary | ICD-10-CM | POA: Diagnosis not present

## 2015-05-10 DIAGNOSIS — M810 Age-related osteoporosis without current pathological fracture: Secondary | ICD-10-CM | POA: Insufficient documentation

## 2015-05-10 DIAGNOSIS — E079 Disorder of thyroid, unspecified: Secondary | ICD-10-CM | POA: Diagnosis not present

## 2015-05-10 DIAGNOSIS — H9190 Unspecified hearing loss, unspecified ear: Secondary | ICD-10-CM | POA: Diagnosis not present

## 2015-05-10 HISTORY — PX: CATARACT EXTRACTION W/PHACO: SHX586

## 2015-05-10 SURGERY — PHACOEMULSIFICATION, CATARACT, WITH IOL INSERTION
Anesthesia: Monitor Anesthesia Care | Site: Eye | Laterality: Left | Wound class: Clean

## 2015-05-10 MED ORDER — ARMC OPHTHALMIC DILATING GEL
OPHTHALMIC | Status: AC
  Administered 2015-05-10: 1 via OPHTHALMIC
  Filled 2015-05-10: qty 0.25

## 2015-05-10 MED ORDER — MOXIFLOXACIN HCL 0.5 % OP SOLN
OPHTHALMIC | Status: AC
  Filled 2015-05-10: qty 3

## 2015-05-10 MED ORDER — MOXIFLOXACIN HCL 0.5 % OP SOLN - NO CHARGE
OPHTHALMIC | Status: DC | PRN
  Administered 2015-05-10: 1 [drp] via OPHTHALMIC

## 2015-05-10 MED ORDER — SODIUM CHLORIDE 0.9 % IV SOLN
INTRAVENOUS | Status: DC
  Administered 2015-05-10: 07:00:00 via INTRAVENOUS

## 2015-05-10 MED ORDER — MIDAZOLAM HCL 2 MG/2ML IJ SOLN
INTRAMUSCULAR | Status: DC | PRN
  Administered 2015-05-10: 1 mg via INTRAVENOUS

## 2015-05-10 MED ORDER — CEFUROXIME OPHTHALMIC INJECTION 1 MG/0.1 ML
INJECTION | OPHTHALMIC | Status: AC
  Filled 2015-05-10: qty 0.1

## 2015-05-10 MED ORDER — ARMC OPHTHALMIC DILATING GEL
1.0000 "application " | OPHTHALMIC | Status: AC | PRN
  Administered 2015-05-10 (×2): 1 via OPHTHALMIC

## 2015-05-10 MED ORDER — POVIDONE-IODINE 5 % OP SOLN
OPHTHALMIC | Status: AC
  Administered 2015-05-10: 1 via OPHTHALMIC
  Filled 2015-05-10: qty 30

## 2015-05-10 MED ORDER — NA CHONDROIT SULF-NA HYALURON 40-17 MG/ML IO SOLN
INTRAOCULAR | Status: AC
  Filled 2015-05-10: qty 1

## 2015-05-10 MED ORDER — TETRACAINE HCL 0.5 % OP SOLN
1.0000 [drp] | OPHTHALMIC | Status: AC | PRN
  Administered 2015-05-10: 1 [drp] via OPHTHALMIC

## 2015-05-10 MED ORDER — POVIDONE-IODINE 5 % OP SOLN
1.0000 "application " | OPHTHALMIC | Status: AC | PRN
  Administered 2015-05-10: 1 via OPHTHALMIC

## 2015-05-10 MED ORDER — CEFUROXIME OPHTHALMIC INJECTION 1 MG/0.1 ML
INJECTION | OPHTHALMIC | Status: DC | PRN
  Administered 2015-05-10: 0.1 mL via INTRACAMERAL

## 2015-05-10 MED ORDER — CARBACHOL 0.01 % IO SOLN
INTRAOCULAR | Status: DC | PRN
  Administered 2015-05-10: 0.5 mL via INTRAOCULAR

## 2015-05-10 MED ORDER — BSS IO SOLN
INTRAOCULAR | Status: DC | PRN
  Administered 2015-05-10: 200 mL

## 2015-05-10 MED ORDER — FENTANYL CITRATE (PF) 100 MCG/2ML IJ SOLN
INTRAMUSCULAR | Status: DC | PRN
  Administered 2015-05-10: 50 ug via INTRAVENOUS

## 2015-05-10 MED ORDER — TETRACAINE HCL 0.5 % OP SOLN
OPHTHALMIC | Status: AC
  Administered 2015-05-10: 1 [drp] via OPHTHALMIC
  Filled 2015-05-10: qty 2

## 2015-05-10 MED ORDER — EPINEPHRINE HCL 1 MG/ML IJ SOLN
INTRAMUSCULAR | Status: AC
  Filled 2015-05-10: qty 1

## 2015-05-10 SURGICAL SUPPLY — 20 items
CANNULA ANT/CHMB 27GA (MISCELLANEOUS) ×2 IMPLANT
GLOVE BIO SURGEON STRL SZ8 (GLOVE) ×2 IMPLANT
GLOVE BIOGEL M 6.5 STRL (GLOVE) ×2 IMPLANT
GLOVE SURG LX 8.0 MICRO (GLOVE) ×1
GLOVE SURG LX STRL 8.0 MICRO (GLOVE) ×1 IMPLANT
GOWN STRL REUS W/ TWL LRG LVL3 (GOWN DISPOSABLE) ×2 IMPLANT
GOWN STRL REUS W/TWL LRG LVL3 (GOWN DISPOSABLE) ×2
LENS IOL TECNIS 23.0 (Intraocular Lens) ×2 IMPLANT
LENS IOL TECNIS MONO 1P 23.0 (Intraocular Lens) ×1 IMPLANT
PACK CATARACT (MISCELLANEOUS) ×2 IMPLANT
PACK CATARACT BRASINGTON LX (MISCELLANEOUS) ×2 IMPLANT
PACK EYE AFTER SURG (MISCELLANEOUS) ×2 IMPLANT
SOL BSS BAG (MISCELLANEOUS) ×2
SOL PREP PVP 2OZ (MISCELLANEOUS) ×2
SOLUTION BSS BAG (MISCELLANEOUS) ×1 IMPLANT
SOLUTION PREP PVP 2OZ (MISCELLANEOUS) ×1 IMPLANT
SYR 5ML LL (SYRINGE) ×2 IMPLANT
SYR TB 1ML 27GX1/2 LL (SYRINGE) ×2 IMPLANT
WATER STERILE IRR 1000ML POUR (IV SOLUTION) ×2 IMPLANT
WIPE NON LINTING 3.25X3.25 (MISCELLANEOUS) ×2 IMPLANT

## 2015-05-10 NOTE — Anesthesia Preprocedure Evaluation (Signed)
Anesthesia Evaluation  Patient identified by MRN, date of birth, ID band Patient awake    Reviewed: Allergy & Precautions, NPO status , Patient's Chart, lab work & pertinent test results  History of Anesthesia Complications Negative for: history of anesthetic complications  Airway Mallampati: II   Neck ROM: Full    Dental  (+) Upper Dentures, Lower Dentures   Pulmonary asthma (last inhaler 2 weeks ago) ,          Cardiovascular + Valvular Problems/Murmurs MVP     Neuro/Psych Depression CVA (amnesia x 24 hours)    GI/Hepatic   Endo/Other  Hypothyroidism   Renal/GU      Musculoskeletal  (+) Arthritis -, Osteoarthritis,    Abdominal   Peds  Hematology  (+) anemia ,   Anesthesia Other Findings   Reproductive/Obstetrics                             Anesthesia Physical Anesthesia Plan  ASA: III  Anesthesia Plan: MAC   Post-op Pain Management:    Induction: Intravenous  Airway Management Planned: Nasal Cannula  Additional Equipment:   Intra-op Plan:   Post-operative Plan:   Informed Consent: I have reviewed the patients History and Physical, chart, labs and discussed the procedure including the risks, benefits and alternatives for the proposed anesthesia with the patient or authorized representative who has indicated his/her understanding and acceptance.     Plan Discussed with:   Anesthesia Plan Comments:         Anesthesia Quick Evaluation

## 2015-05-10 NOTE — Op Note (Signed)
PREOPERATIVE DIAGNOSIS:  Nuclear sclerotic cataract of the left eye.   POSTOPERATIVE DIAGNOSIS:  nuclear sclerotic cataract left eye   OPERATIVE PROCEDURE:  Procedure(s): CATARACT EXTRACTION PHACO AND INTRAOCULAR LENS PLACEMENT (IOC)   SURGEON:  Birder Robson, MD.   ANESTHESIA:   Anesthesiologist: Gunnar Fusi, MD CRNA: Demetrius Charity, CRNA  1.      Managed anesthesia care. 2.      Topical tetracaine drops followed by 2% Xylocaine jelly applied in the preoperative holding area.   COMPLICATIONS:  None.   TECHNIQUE:   Stop and chop   DESCRIPTION OF PROCEDURE:  The patient was examined and consented in the preoperative holding area where the aforementioned topical anesthesia was applied to the left eye and then brought back to the Operating Room where the left eye was prepped and draped in the usual sterile ophthalmic fashion and a lid speculum was placed. A paracentesis was created with the side port blade and the anterior chamber was filled with viscoelastic. A near clear corneal incision was performed with the steel keratome. A continuous curvilinear capsulorrhexis was performed with a cystotome followed by the capsulorrhexis forceps. Hydrodissection and hydrodelineation were carried out with BSS on a blunt cannula. The lens was removed in a stop and chop  technique and the remaining cortical material was removed with the irrigation-aspiration handpiece. The capsular bag was inflated with viscoelastic and the Technis ZCB00 lens was placed in the capsular bag without complication. The remaining viscoelastic was removed from the eye with the irrigation-aspiration handpiece. The wounds were hydrated. The anterior chamber was flushed with Miostat and the eye was inflated to physiologic pressure. 0.1 mL of cefuroxime concentration 10 mg/mL was placed in the anterior chamber. The wounds were found to be water tight. The eye was dressed with Vigamox. The patient was given protective glasses to  wear throughout the day and a shield with which to sleep tonight. The patient was also given drops with which to begin a drop regimen today and will follow-up with me in one day.  Implant Name Type Inv. Item Serial No. Manufacturer Lot No. LRB No. Used  LENS IMPL INTRAOC ZCB00 23.0 - [id number] Intraocular Lens LENS IMPL INTRAOC ZCB00 23.0 [id number] AMO   Left 1   Procedure(s) with comments: CATARACT EXTRACTION PHACO AND INTRAOCULAR LENS PLACEMENT (IOC) (Left) - [place] 00:59 AP% 26.7 CDE 15.85 fluid pack lot#[id number]  Electronically signed: Latunya Kissick L[place]IS 05/10/2015 8:11 AM

## 2015-05-10 NOTE — Transfer of Care (Signed)
Immediate Anesthesia Transfer of Care Note  Patient: Carla Kelly  Procedure(s) Performed: Procedure(s) with comments: CATARACT EXTRACTION PHACO AND INTRAOCULAR LENS PLACEMENT (IOC) (Left) - [place] 00:59 AP% 26.7 CDE 15.85 fluid pack lot#[id number]  Patient Location: Short Stay  Anesthesia Type:MAC  Level of Consciousness: awake, alert  and oriented  Airway & Oxygen Therapy: Patient Spontanous Breathing  Post-op Assessment: Report given to RN and Post -op Vital signs reviewed and stable  Post vital signs: Reviewed and stable  Last Vitals:  Filed Vitals:   05/10/15 0817  BP: 162/79  Pulse: 67  Temp: 35.5 C  Resp: 22    Complications: No apparent anesthesia complications

## 2015-05-10 NOTE — Anesthesia Postprocedure Evaluation (Signed)
  Anesthesia Post-op Note  Patient: Carla Kelly  Procedure(s) Performed: Procedure(s) with comments: CATARACT EXTRACTION PHACO AND INTRAOCULAR LENS PLACEMENT (IOC) (Left) - [place] 00:59 AP% 26.7 CDE 15.85 fluid pack lot#[id number]  Anesthesia type:MAC  Patient location: PACU  Post pain: Pain level controlled  Post assessment: Post-op Vital signs reviewed, Patient's Cardiovascular Status Stable, Respiratory Function Stable, Patent Airway and No signs of Nausea or vomiting  Post vital signs: Reviewed and stable  Last Vitals:  Filed Vitals:   05/10/15 0817  BP: 162/79  Pulse: 67  Temp: 35.5 C  Resp: 22    Level of consciousness: awake, alert  and patient cooperative  Complications: No apparent anesthesia complications

## 2015-05-10 NOTE — Discharge Instructions (Addendum)
See cataract post op handout  AMBULATORY SURGERY  DISCHARGE INSTRUCTIONS   1) The drugs that you were given will stay in your system until tomorrow so for the next 24 hours you should not:  A) Drive an automobile B) Make any legal decisions C) Drink any alcoholic beverage   2) You may resume regular meals tomorrow.  Today it is better to start with liquids and gradually work up to solid foods.  You may eat anything you prefer, but it is better to start with liquids, then soup and crackers, and gradually work up to solid foods.   3) Please notify your doctor immediately if you have any unusual bleeding, trouble breathing, redness and pain at the surgery site, drainage, fever, or pain not relieved by medication. 4)   5) Your post-operative visit with Dr.                                     is: Date:                        Time:    Please call to schedule your post-operative visit.  6) Additional Instructions: 7)

## 2015-05-10 NOTE — H&P (Signed)
  All labs reviewed. Abnormal studies sent to patients PCP when indicated.  Previous H&P reviewed, patient examined, there are NO CHANGES.  Carla Kelly LOUIS6/14/20167:45 AM

## 2015-05-10 NOTE — Anesthesia Procedure Notes (Signed)
Procedure Name: MAC Performed by: Joyceann Kruser Pre-anesthesia Checklist: Patient identified, Emergency Drugs available, Suction available, Patient being monitored and Timeout performed Oxygen Delivery Method: Nasal cannula Placement Confirmation: positive ETCO2     

## 2015-05-12 MED FILL — Moxifloxacin HCl Ophth Soln 0.5% (Base Equiv): OPHTHALMIC | Qty: 3 | Status: AC

## 2015-05-23 ENCOUNTER — Other Ambulatory Visit: Payer: Self-pay | Admitting: Family Medicine

## 2015-05-23 NOTE — Telephone Encounter (Signed)
Pt had CPE on 02/15/15, last refilled 03/21/15 #90 with 0 refills, please advise

## 2015-05-23 NOTE — Telephone Encounter (Signed)
Rx called in as prescribed 

## 2015-05-23 NOTE — Telephone Encounter (Signed)
Px written for call in   

## 2015-07-26 ENCOUNTER — Other Ambulatory Visit: Payer: Self-pay | Admitting: Family Medicine

## 2015-07-26 NOTE — Telephone Encounter (Signed)
Diazepam called into [facility].

## 2015-07-26 NOTE — Telephone Encounter (Signed)
Electronic refill request, pt has CPE scheduled on 02/17/16, last refilled on 05/23/15 #90 with 0 refills, please advise

## 2015-07-26 NOTE — Telephone Encounter (Signed)
Px written for call in   

## 2015-08-05 ENCOUNTER — Other Ambulatory Visit: Payer: Self-pay | Admitting: Family Medicine

## 2015-08-05 NOTE — Telephone Encounter (Signed)
done

## 2015-08-05 NOTE — Telephone Encounter (Signed)
Electronic refill request, please advise  

## 2015-08-05 NOTE — Telephone Encounter (Signed)
Please refill for a year  

## 2015-08-17 ENCOUNTER — Other Ambulatory Visit (INDEPENDENT_AMBULATORY_CARE_PROVIDER_SITE_OTHER): Payer: Medicare Other | Admitting: *Deleted

## 2015-08-17 DIAGNOSIS — Z23 Encounter for immunization: Secondary | ICD-10-CM | POA: Diagnosis not present

## 2015-08-27 DIAGNOSIS — S0230XA Fracture of orbital floor, unspecified side, initial encounter for closed fracture: Secondary | ICD-10-CM

## 2015-08-27 HISTORY — DX: Fracture of orbital floor, unspecified side, initial encounter for closed fracture: S02.30XA

## 2015-09-21 ENCOUNTER — Ambulatory Visit: Payer: Medicare Other | Admitting: Family Medicine

## 2015-10-07 ENCOUNTER — Other Ambulatory Visit: Payer: Self-pay | Admitting: Family Medicine

## 2015-10-07 NOTE — Telephone Encounter (Signed)
Rx called in as prescribed 

## 2015-10-07 NOTE — Telephone Encounter (Signed)
Electronic refill request, pt has CPE scheduled for 02/17/16, last refilled on 07/26/15 #90 with 0 refills, please advise

## 2015-10-07 NOTE — Telephone Encounter (Signed)
Px written for call in   

## 2015-12-09 ENCOUNTER — Other Ambulatory Visit: Payer: Self-pay

## 2015-12-09 MED ORDER — DIAZEPAM 5 MG PO TABS: 5.0000 mg | ORAL_TABLET | Freq: Three times a day (TID) | ORAL | Status: DC | PRN

## 2015-12-09 NOTE — Telephone Encounter (Signed)
Px written for call in   

## 2015-12-09 NOTE — Telephone Encounter (Signed)
Pt left v/m requesting refill diazepam to [facility] [address]. Last refill # 90 on 10/07/15. Last annual 02/15/15.

## 2015-12-12 NOTE — Telephone Encounter (Signed)
Rx called in as prescribed 

## 2016-02-12 ENCOUNTER — Telehealth: Payer: Self-pay | Admitting: Family Medicine

## 2016-02-12 DIAGNOSIS — Z0000 Encounter for general adult medical examination without abnormal findings: Secondary | ICD-10-CM | POA: Insufficient documentation

## 2016-02-12 DIAGNOSIS — E538 Deficiency of other specified B group vitamins: Secondary | ICD-10-CM

## 2016-02-12 DIAGNOSIS — R600 Localized edema: Secondary | ICD-10-CM

## 2016-02-12 DIAGNOSIS — D62 Acute posthemorrhagic anemia: Secondary | ICD-10-CM

## 2016-02-12 DIAGNOSIS — E785 Hyperlipidemia, unspecified: Secondary | ICD-10-CM

## 2016-02-12 DIAGNOSIS — E559 Vitamin D deficiency, unspecified: Secondary | ICD-10-CM

## 2016-02-12 DIAGNOSIS — E039 Hypothyroidism, unspecified: Secondary | ICD-10-CM

## 2016-02-12 NOTE — Telephone Encounter (Signed)
-----   Message from Marchia Bond sent at 02/06/2016  1:21 PM EDT ----- Regarding: Cpx labs Mon 3/20, need orders. Thanks! :-) Please order  future cpx labs for pt's upcoming lab appt. Thanks Aniceto Boss

## 2016-02-13 ENCOUNTER — Other Ambulatory Visit (INDEPENDENT_AMBULATORY_CARE_PROVIDER_SITE_OTHER): Payer: Medicare Other

## 2016-02-13 DIAGNOSIS — E039 Hypothyroidism, unspecified: Secondary | ICD-10-CM

## 2016-02-13 DIAGNOSIS — E559 Vitamin D deficiency, unspecified: Secondary | ICD-10-CM | POA: Diagnosis not present

## 2016-02-13 DIAGNOSIS — E538 Deficiency of other specified B group vitamins: Secondary | ICD-10-CM | POA: Diagnosis not present

## 2016-02-13 DIAGNOSIS — Z0000 Encounter for general adult medical examination without abnormal findings: Secondary | ICD-10-CM | POA: Diagnosis not present

## 2016-02-13 DIAGNOSIS — E785 Hyperlipidemia, unspecified: Secondary | ICD-10-CM | POA: Diagnosis not present

## 2016-02-13 LAB — LIPID PANEL
Cholesterol: 180 mg/dL (ref 0–200)
HDL: 52.1 mg/dL (ref >39.00)
LDL Cholesterol: 111 mg/dL — ABNORMAL HIGH (ref 0–99)
NONHDL: 128.14
Total CHOL/HDL Ratio: 3
Triglycerides: 85 mg/dL (ref 0.0–149.0)
VLDL: 17 mg/dL (ref 0.0–40.0)

## 2016-02-13 LAB — CBC WITH DIFFERENTIAL/PLATELET
BASOS ABS: 0 10*3/uL (ref 0.0–0.1)
Basophils Relative: 0.5 % (ref 0.0–3.0)
Eosinophils Absolute: 0.2 10*3/uL (ref 0.0–0.7)
Eosinophils Relative: 4.4 % (ref 0.0–5.0)
HEMATOCRIT: 37.6 % (ref 36.0–46.0)
Hemoglobin: 12.5 g/dL (ref 12.0–15.0)
LYMPHS PCT: 54.1 % — AB (ref 12.0–46.0)
Lymphs Abs: 3 10*3/uL (ref 0.7–4.0)
MCHC: 33.4 g/dL (ref 30.0–36.0)
MCV: 92.3 fl (ref 78.0–100.0)
Monocytes Absolute: 0.4 10*3/uL (ref 0.1–1.0)
Monocytes Relative: 6.9 % (ref 3.0–12.0)
NEUTROS PCT: 34.1 % — AB (ref 43.0–77.0)
Neutro Abs: 1.9 10*3/uL (ref 1.4–7.7)
Platelets: 188 10*3/uL (ref 150.0–400.0)
RBC: 4.07 Mil/uL (ref 3.87–5.11)
RDW: 13.3 % (ref 11.5–15.5)
WBC: 5.5 10*3/uL (ref 4.0–10.5)

## 2016-02-13 LAB — COMPREHENSIVE METABOLIC PANEL
ALK PHOS: 69 U/L (ref 39–117)
ALT: 10 U/L (ref 0–35)
AST: 17 U/L (ref 0–37)
Albumin: 4 g/dL (ref 3.5–5.2)
BILIRUBIN TOTAL: 0.4 mg/dL (ref 0.2–1.2)
BUN: 21 mg/dL (ref 6–23)
CALCIUM: 9.3 mg/dL (ref 8.4–10.5)
CO2: 31 mEq/L (ref 19–32)
Chloride: 106 mEq/L (ref 96–112)
Creatinine, Ser: 0.75 mg/dL (ref 0.40–1.20)
GFR: 78.81 mL/min (ref >60.00)
GLUCOSE: 65 mg/dL — AB (ref 70–99)
Potassium: 4 mEq/L (ref 3.5–5.1)
Sodium: 142 mEq/L (ref 135–145)
TOTAL PROTEIN: 6.4 g/dL (ref 6.0–8.3)

## 2016-02-13 LAB — TSH: TSH: 0.82 u[IU]/mL (ref 0.35–4.50)

## 2016-02-13 LAB — VITAMIN B12: VITAMIN B 12: 1024 pg/mL — AB (ref 211–911)

## 2016-02-13 LAB — VITAMIN D 25 HYDROXY (VIT D DEFICIENCY, FRACTURES): VITD: 34.97 ng/mL (ref 30.00–100.00)

## 2016-02-17 ENCOUNTER — Encounter: Payer: Medicare Other | Admitting: Family Medicine

## 2016-02-21 ENCOUNTER — Ambulatory Visit (INDEPENDENT_AMBULATORY_CARE_PROVIDER_SITE_OTHER): Payer: Medicare Other

## 2016-02-21 ENCOUNTER — Ambulatory Visit (INDEPENDENT_AMBULATORY_CARE_PROVIDER_SITE_OTHER): Payer: Medicare Other | Admitting: Family Medicine

## 2016-02-21 ENCOUNTER — Encounter: Payer: Self-pay | Admitting: Family Medicine

## 2016-02-21 VITALS — BP 130/70 | HR 75 | Temp 98.5°F | Ht 67.0 in | Wt 153.5 lb

## 2016-02-21 VITALS — BP 142/80 | HR 75 | Temp 98.5°F | Ht 67.0 in | Wt 153.5 lb

## 2016-02-21 DIAGNOSIS — E039 Hypothyroidism, unspecified: Secondary | ICD-10-CM | POA: Diagnosis not present

## 2016-02-21 DIAGNOSIS — Z79899 Other long term (current) drug therapy: Secondary | ICD-10-CM | POA: Diagnosis not present

## 2016-02-21 DIAGNOSIS — M8580 Other specified disorders of bone density and structure, unspecified site: Secondary | ICD-10-CM

## 2016-02-21 DIAGNOSIS — E785 Hyperlipidemia, unspecified: Secondary | ICD-10-CM

## 2016-02-21 DIAGNOSIS — E538 Deficiency of other specified B group vitamins: Secondary | ICD-10-CM | POA: Diagnosis not present

## 2016-02-21 DIAGNOSIS — E559 Vitamin D deficiency, unspecified: Secondary | ICD-10-CM

## 2016-02-21 DIAGNOSIS — Z0000 Encounter for general adult medical examination without abnormal findings: Secondary | ICD-10-CM | POA: Diagnosis not present

## 2016-02-21 DIAGNOSIS — E162 Hypoglycemia, unspecified: Secondary | ICD-10-CM

## 2016-02-21 MED ORDER — SYNTHROID 88 MCG PO TABS: ORAL_TABLET | ORAL | Status: DC

## 2016-02-21 NOTE — Assessment & Plan Note (Signed)
Lab Results  Component Value Date   VITAMINB12 1024* 02/13/2016   Pt continues oral supplementation and feels better on it

## 2016-02-21 NOTE — Assessment & Plan Note (Signed)
Unchanged osteopenia 4/16 dexa  One fall with traumatic fracture this year (not a fragility fracture) Disc need for calcium/ vitamin D/ wt bearing exercise and bone density test every 2 y to monitor Disc safety/ fracture risk in detail   Enc to continue ca and D

## 2016-02-21 NOTE — Assessment & Plan Note (Signed)
Vitamin D level is therapeutic with current supplementation Disc importance of this to bone and overall health Level in 30s Also enc outdoor time

## 2016-02-21 NOTE — Assessment & Plan Note (Signed)
Reviewed health habits including diet and exercise and skin cancer prevention Reviewed appropriate screening tests for age  Also reviewed health mt list, fam hx and immunization status , as well as social and family history   See HPI AMW visit reviewed Labs reviewed  Blood pressure is better on the 2nd check  Make sure to schedule your own mammogram  Stay active  Take care of yourself  Eat frequent small meals with protein for blood sugar  Labs are stable  Colonoscopy would be due in 2020 if you want to do it at that age

## 2016-02-21 NOTE — Assessment & Plan Note (Signed)
Hypothyroidism  Pt has no clinical changes No change in energy level/ hair or skin/ edema and no tremor Lab Results  Component Value Date   TSH 0.82 02/13/2016

## 2016-02-21 NOTE — Patient Instructions (Addendum)
Carla Kelly , Thank you for taking time to come for your Medicare Wellness Visit. I appreciate your ongoing commitment to your health goals. Please review the following plan we discussed and let me know if I can assist you in the future.    This is a list of the screening recommended for you and due dates:  Health Maintenance  Topic Date Due  . Shingles Vaccine  12/12/2021*  . Mammogram  03/09/2016  . Flu Shot  06/26/2016  . Tetanus Vaccine  12/12/2022  . DEXA scan (bone density measurement)  Completed  . Pneumonia vaccines  Completed  *Topic was postponed. The date shown is not the original due date.   Preventive Care for Adults  A healthy lifestyle and preventive care can promote health and wellness. Preventive health guidelines for adults include the following key practices.  . A routine yearly physical is a good way to check with your health care provider about your health and preventive screening. It is a chance to share any concerns and updates on your health and to receive a thorough exam.  . Visit your dentist for a routine exam and preventive care every 6 months. Brush your teeth twice a day and floss once a day. Good oral hygiene prevents tooth decay and gum disease.  . The frequency of eye exams is based on your age, health, family medical history, use  of contact lenses, and other factors. Follow your health care provider's ecommendations for frequency of eye exams.  . Eat a healthy diet. Foods like vegetables, fruits, whole grains, low-fat dairy products, and lean protein foods contain the nutrients you need without too many calories. Decrease your intake of foods high in solid fats, added sugars, and salt. Eat the right amount of calories for you. Get information about a proper diet from your health care provider, if necessary.  . Regular physical exercise is one of the most important things you can do for your health. Most adults should get at least 150 minutes of  moderate-intensity exercise (any activity that increases your heart rate and causes you to sweat) each week. In addition, most adults need muscle-strengthening exercises on 2 or more days a week.  Silver Sneakers may be a benefit available to you. To determine eligibility, you may visit the website: www.silversneakers.com or contact program at (340) 731-4364 Mon-Fri between 8AM-8PM.   . Maintain a healthy weight. The body mass index (BMI) is a screening tool to identify possible weight problems. It provides an estimate of body fat based on height and weight. Your health care provider can find your BMI and can help you achieve or maintain a healthy weight.   For adults 20 years and older: ? A BMI below 18.5 is considered underweight. ? A BMI of 18.5 to 24.9 is normal. ? A BMI of 25 to 29.9 is considered overweight. ? A BMI of 30 and above is considered obese.   . Maintain normal blood lipids and cholesterol levels by exercising and minimizing your intake of saturated fat. Eat a balanced diet with plenty of fruit and vegetables. Blood tests for lipids and cholesterol should begin at age 40 and be repeated every 5 years. If your lipid or cholesterol levels are high, you are over 50, or you are at high risk for heart disease, you may need your cholesterol levels checked more frequently. Ongoing high lipid and cholesterol levels should be treated with medicines if diet and exercise are not working.  . If you smoke, find  out from your health care provider how to quit. If you do not use tobacco, please do not start.  . If you choose to drink alcohol, please do not consume more than 2 drinks per day. One drink is considered to be 12 ounces (355 mL) of beer, 5 ounces (148 mL) of wine, or 1.5 ounces (44 mL) of liquor.  . If you are 43-25 years old, ask your health care provider if you should take aspirin to prevent strokes.  . Use sunscreen. Apply sunscreen liberally and repeatedly throughout the day. You  should seek shade when your shadow is shorter than you. Protect yourself by wearing long sleeves, pants, a wide-brimmed hat, and sunglasses year round, whenever you are outdoors.  . Once a month, do a whole body skin exam, using a mirror to look at the skin on your back. Tell your health care provider of new moles, moles that have irregular borders, moles that are larger than a pencil eraser, or moles that have changed in shape or color.

## 2016-02-21 NOTE — Progress Notes (Signed)
Subjective:    Patient ID: Carla Kelly, female    DOB: February 11, 1933, 82 y.o.   MRN: [id number]  HPI Here for health maintenance exam and to review chronic medical problems   Has been feeling pretty good   Reviewed  AMW with Katha Cabal today   Became a great great grandmother  Hearing is ok - but she c/o chronic ringing in her ears (did well on hearing test) Also poss from allergies   Incontinence is biggest problem May consider botox  microplasiq worked short time   [facility] is stable with bmi of 24 (thinks she gained and then lost after the holidays)   Mammogram 4/16 -cyst aspiriation- already send her notices  Wanted to get her exam first  Self exam -no lumps or changes  Has dark spots on skin under breasts  Eating healthy Not as much exercise - better when she has access to her bike  May try a treadmill  Does walk the steps a lot at her daugher's house  Also a lot of yard work and house work    dexa 4/16 unchanged osteopenia  Falls - had one in 10/16- [place] ramp at [employer] (part of the concrete was missing) - with hands full- fell forward -laceration under and over R eye  fx-sustained non blowout fx of orbital floor (injured by her glasses)- eye is ok  Also bruised her R knee-is ok  Thinks she healed up pretty fast  D level is 34  Colonoscopy 2011 - recommended 5 year recall for polyp -then changed the adv to 10 y Now over 77-may not want it   Hx of B12 def Lab Results  Component Value Date   VITAMINB12 1024* 02/13/2016  B12 makes her feel better   Hypothyroidism  Pt has no clinical changes No change in energy level/ hair or skin/ edema and no tremor Lab Results  Component Value Date   TSH 0.82 02/13/2016     Hx of high lipids Lab Results  Component Value Date   CHOL 180 02/13/2016   CHOL 167 02/10/2015   CHOL 182 02/21/2014   Lab Results  Component Value Date   HDL 52.10 02/13/2016   HDL 65.50 02/10/2015     HDL 58 02/21/2014   Lab Results  Component Value Date   LDLCALC 111* 02/13/2016   LDLCALC 90 02/10/2015   LDLCALC 110* 02/21/2014   Lab Results  Component Value Date   TRIG 85.0 02/13/2016   TRIG 59.0 02/10/2015   TRIG 68 02/21/2014   Lab Results  Component Value Date   CHOLHDL 3 02/13/2016   CHOLHDL 3 02/10/2015   CHOLHDL 3.1 02/21/2014   Lab Results  Component Value Date   LDLDIRECT 149.5 12/08/2013   LDLDIRECT 111.0 12/04/2012   LDLDIRECT 128.3 09/17/2011   diet is good Stays active   Will keep taking vit D -level is now in the 30s    Patient Active Problem List   Diagnosis Date Noted  . Routine general medical examination at a health care facility 02/12/2016  . Lump of breast, left 03/07/2015  . Estrogen deficiency 02/15/2015  . Colon cancer screening 02/15/2015  . Arthritis of right knee 08/13/2014  . Status post total right knee replacement 08/13/2014  . Amnesia 02/20/2014  . Transient global amnesia 02/20/2014  . Encounter for Medicare annual wellness exam 12/07/2013  . Allergic drug rash 03/16/2013  . Psoriasis 02/16/2013  . Other  screening mammogram 08/19/2012  . Pedal edema 03/11/2012  . B12 deficiency 09/05/2009  . Vitamin D deficiency 11/08/2008  . Hyperlipidemia 08/25/2008  . Osteopenia 08/25/2008  . Personal history of colonic polyps 08/25/2008  . HERPES ZOSTER, HX OF 06/03/2008  . Hypothyroidism 08/04/2007  . HYPOGLYCEMIA 08/04/2007  . ASTHMA 08/04/2007  . IRRITABLE BOWEL SYNDROME 08/04/2007  . VAGINITIS, ATROPHIC 08/04/2007  . Osteoarthrosis, unspecified whether generalized or localized, unspecified site 08/04/2007  . FIBROMYALGIA 08/04/2007  . URINARY INCONTINENCE 08/04/2007   Past Medical History  Diagnosis Date  . Hypothyroidism   . Osteoarthritis   . IBS (irritable bowel syndrome)   . Fibromyalgia   . History of herpes zoster   . HLD (hyperlipidemia)   . Adenomatous colon polyp   . Other B-complex deficiencies   .  Osteoarthrosis, unspecified whether generalized or localized, unspecified site   . Embolism and thrombosis of unspecified site   . Disorder of bone and cartilage, unspecified   . Unspecified vitamin D deficiency   . Unspecified urinary incontinence   . Mitral valve disorder     "leaking mitral valve-very mild"  . Swelling of extremity, right     right leg  . Psoriasis   . 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 Asthma   . Hypoglycemia   . Stroke ([place])   . Depression   . Orbital floor fracture ([place]) 10/16    with a fall    Past Surgical History  Procedure Laterality Date  . Abdominal hysterectomy  1970    fibroids; endometriosis  . Cervical disc surgery      x 2  . Colonoscopy  2/02    polyps, BE-polyps  . US transvaginal pelvic modified  10/02    small left ovarian cyst  . Bladder tack  5/05  . Cataract extraction Right 12/13    Dr George Ina  . Cholecystectomy  2004  . Mouth surgery      lower teeth have 4 studs to keep in place  . Eye surgery      right eye  . Total knee arthroplasty Right 08/13/2014    Procedure: RIGHT TOTAL KNEE ARTHROPLASTY;  Surgeon: Mcarthur Rossetti, MD;  Location: [facility];  Service: Orthopedics;  Laterality: Right;  . Appendectomy    . Tonsillectomy    . Tympanoplasty    . Cataract extraction w/phaco Left 05/10/2015    Procedure: CATARACT EXTRACTION PHACO AND INTRAOCULAR LENS PLACEMENT (IOC);  Surgeon: Birder Robson, MD;  Location: [facility] OR[facility];  Service: Ophthalmology;  Laterality: Left;  [place] 00:59 AP% 26.7 CDE 15.85 fluid pack [id number]   Social History  Substance Use Topics  . Smoking status: Never Smoker   . Smokeless tobacco: Never Used  . Alcohol Use: No     Comment: "is allergic"   Family History  Problem Relation Age of Onset  . Heart attack Mother   . Stroke Mother     several  . Lupus Brother   . Other Sister     benign brain tumor  . Asthma Daughter   . Other Brother     heart problems  . Colon cancer      1st  cousin   Allergies  Allergen Reactions  . Alcohol-Sulfur [Sulfur] Shortness Of Breath and Swelling    Alcohol po swells throat  . Ergocalciferol Other (See Comments)    REACTION: GI side effects  . Morphine Nausea And Vomiting  . Tolterodine Tartrate Other (See Comments)    REACTION: ? reaction  .  Myrbetriq [Mirabegron] Other (See Comments)    unknown  . Nortriptyline Hcl Other (See Comments)    unknown   Current Outpatient Prescriptions on File Prior to Visit  Medication Sig Dispense Refill  . albuterol (PROVENTIL) 4 MG tablet TAKE 1 TABLET BY MOUTH EVERY 4 HOURS AS NEEDED. 120 tablet 1  . aspirin 325 MG tablet Take 325 mg by mouth daily.    . Calcium 1500 MG tablet Take 1,500 mg by mouth every morning.     . diazepam (VALIUM) 5 MG tablet Take 1 tablet (5 mg total) by mouth 3 (three) times daily as needed. 90 tablet 0  . ESTRACE VAGINAL 0.1 MG/GM vaginal cream INSERT ONE APPLICATORFUL INTO THE VAGINA AT BEDTIME 42.5 g 11  . furosemide (LASIX) 40 MG tablet TAKE 1 TABLET BY MOUTH EVERY DAY (Patient taking differently: TAKE 1 TABLET BY MOUTH EVERY DAY AS NEEDED) 30 tablet 5  . HYDROcodone-acetaminophen (NORCO/VICODIN) 5-325 MG per tablet Take 1 tablet by mouth 2 (two) times daily as needed. for pain  0  . Multiple Vitamin (MULTIVITAMIN) tablet Take 1 tablet by mouth every morning.     Marland Kitchen PROAIR HFA 108 (90 BASE) MCG/ACT inhaler INHALE 2 PUFFS EVERY 4 HOURS AS NEEDED 8.5 each 5   No current facility-administered medications on file prior to visit.    Review of Systems Review of Systems  Constitutional: Negative for fever, appetite change, fatigue and unexpected weight change.  Eyes: Negative for pain and visual disturbance.  Respiratory: Negative for cough and shortness of breath.   Cardiovascular: Negative for cp or palpitations    Gastrointestinal: Negative for nausea, diarrhea and constipation.  Genitourinary: Negative for urgency and frequency. pos for baseline incontinence    Skin: Negative for pallor or rash   Neurological: Negative for weakness, light-headedness, numbness and headaches.  Hematological: Negative for adenopathy. Does not bruise/bleed easily.  Psychiatric/Behavioral: Negative for dysphoric mood. The patient is not nervous/anxious.         Objective:   Physical Exam  Constitutional: She appears well-developed and well-nourished. No distress.  Well appearing   HENT:  Head: Normocephalic and atraumatic.  Right Ear: External ear normal.  Left Ear: External ear normal.  Mouth/Throat: Oropharynx is clear and moist.  Eyes: Conjunctivae and EOM are normal. Pupils are equal, round, and reactive to light. No scleral icterus.  Neck: Normal range of motion. Neck supple. No JVD present. Carotid bruit is not present. No thyromegaly present.  Cardiovascular: Normal rate, regular rhythm, normal heart sounds and intact distal pulses.  Exam reveals no gallop.   Pulmonary/Chest: Effort normal and breath sounds normal. No respiratory distress. She has no wheezes. She exhibits no tenderness.  Abdominal: Soft. Bowel sounds are normal. She exhibits no distension, no abdominal bruit and no mass. There is no tenderness.  Genitourinary: No breast swelling, tenderness, discharge or bleeding.  Breast exam: No mass, nodules, thickening, tenderness, bulging, retraction, inflamation, nipple discharge or skin changes noted.  No axillary or clavicular LA.      Musculoskeletal: Normal range of motion. She exhibits no edema or tenderness.  Lymphadenopathy:    She has no cervical adenopathy.  Neurological: She is alert. She has normal reflexes. No cranial nerve deficit. She exhibits normal muscle tone. Coordination normal.  Skin: Skin is warm and dry. No rash noted. No erythema. No pallor.  SKs noted on back and under breasts  Psychiatric: She has a normal mood and affect.          Assessment &  Plan:   Problem List Items Addressed This Visit      Digestive   B12  deficiency - Primary    Lab Results  Component Value Date   VITAMINB12 1024* 02/13/2016   Pt continues oral supplementation and feels better on it         Endocrine   HYPOGLYCEMIA    Fasting glucose 65 Reminded pt to carry protein rich snacks and not to skip meals Does better with smaller more frequent meals      Hypothyroidism    Hypothyroidism  Pt has no clinical changes No change in energy level/ hair or skin/ edema and no tremor Lab Results  Component Value Date   TSH 0.82 02/13/2016          Relevant Medications   SYNTHROID 88 MCG tablet     Musculoskeletal and Integument   Osteopenia    Unchanged osteopenia 4/16 dexa  One fall with traumatic fracture this year (not a fragility fracture) Disc need for calcium/ vitamin D/ wt bearing exercise and bone density test every 2 y to monitor Disc safety/ fracture risk in detail   Enc to continue ca and D         Other   Hyperlipidemia    LDL up slt/ HDL down slt  Disc goals for lipids and reasons to control them Rev labs with pt Rev low sat fat diet in detail Pt will work harder on diet and exercise Continue to follow      Routine general medical examination at a health care facility    Reviewed health habits including diet and exercise and skin cancer prevention Reviewed appropriate screening tests for age  Also reviewed health mt list, fam hx and immunization status , as well as social and family history   See HPI AMW visit reviewed Labs reviewed  Blood pressure is better on the 2nd check  Make sure to schedule your own mammogram  Stay active  Take care of yourself  Eat frequent small meals with protein for blood sugar  Labs are stable  Colonoscopy would be due in 2020 if you want to do it at that age        Vitamin D deficiency    Vitamin D level is therapeutic with current supplementation Disc importance of this to bone and overall health Level in 30s Also enc outdoor time

## 2016-02-21 NOTE — Assessment & Plan Note (Signed)
Fasting glucose 65 Reminded pt to carry protein rich snacks and not to skip meals Does better with smaller more frequent meals

## 2016-02-21 NOTE — Progress Notes (Signed)
Pre visit review using our clinic review tool, if applicable. No additional management support is needed unless otherwise documented below in the visit note. 

## 2016-02-21 NOTE — Assessment & Plan Note (Signed)
LDL up slt/ HDL down slt  Disc goals for lipids and reasons to control them Rev labs with pt Rev low sat fat diet in detail Pt will work harder on diet and exercise Continue to follow

## 2016-02-21 NOTE — Progress Notes (Signed)
Subjective:   Carla Kelly is a 82 y.o. female who presents for Medicare Annual (Subsequent) preventive examination.   Cardiac Risk Factors include: advanced age (>10men, >87 women);hypertension     Objective:     Vitals: BP 142/80 mmHg  Pulse 75  Temp(Src) 98.5 F (36.9 C) (Oral)  Ht 5\' 7"  (1.702 m)  Wt 153 lb 8 oz (69.627 kg)  BMI 24.04 kg/m2  SpO2 97%  Body mass index is 24.04 kg/(m^2).   Tobacco History  Smoking status  . Never Smoker   Smokeless tobacco  . Never Used     Counseling given: No   Past Medical History  Diagnosis Date  . Hypothyroidism   . Osteoarthritis   . IBS (irritable bowel syndrome)   . Fibromyalgia   . History of herpes zoster   . HLD (hyperlipidemia)   . Adenomatous colon polyp   . Other B-complex deficiencies   . Osteoarthrosis, unspecified whether generalized or localized, unspecified site   . Embolism and thrombosis of unspecified site   . Disorder of bone and cartilage, unspecified   . Unspecified vitamin D deficiency   . Unspecified urinary incontinence   . Mitral valve disorder     "leaking mitral valve-very mild"  . Swelling of extremity, right     right leg  . Psoriasis   . 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 Asthma   . Hypoglycemia   . Stroke ([place])   . Depression    Past Surgical History  Procedure Laterality Date  . Abdominal hysterectomy  1970    fibroids; endometriosis  . Cervical disc surgery      x 2  . Colonoscopy  2/02    polyps, BE-polyps  . US transvaginal pelvic modified  10/02    small left ovarian cyst  . Bladder tack  5/05  . Cataract extraction Right 12/13    Dr George Ina  . Cholecystectomy  2004  . Mouth surgery      lower teeth have 4 studs to keep in place  . Eye surgery      right eye  . Total knee arthroplasty Right 08/13/2014    Procedure: RIGHT TOTAL KNEE ARTHROPLASTY;  Surgeon: Mcarthur Rossetti, MD;  Location: [facility];  Service: Orthopedics;  Laterality: Right;  .  Appendectomy    . Tonsillectomy    . Tympanoplasty    . Cataract extraction w/phaco Left 05/10/2015    Procedure: CATARACT EXTRACTION PHACO AND INTRAOCULAR LENS PLACEMENT (IOC);  Surgeon: Birder Robson, MD;  Location: [facility] OR[facility];  Service: Ophthalmology;  Laterality: Left;  [place] 00:59 AP% 26.7 CDE 15.85 fluid pack lot#[id number]   Family History  Problem Relation Age of Onset  . Heart attack Mother   . Stroke Mother     several  . Lupus Brother   . Other Sister     benign brain tumor  . Asthma Daughter   . Other Brother     heart problems  . Colon cancer      1st cousin   History  Sexual Activity  . Sexual Activity: No    Outpatient Encounter Prescriptions as of 02/21/2016  Medication Sig  . albuterol (PROVENTIL) 4 MG tablet TAKE 1 TABLET BY MOUTH EVERY 4 HOURS AS NEEDED.  Marland Kitchen aspirin 325 MG tablet Take 325 mg by mouth daily.  . Biotin 2500 MCG CAPS Take 2,500 mcg by mouth daily.  . Calcium 1500 MG tablet Take 1,500 mg by mouth every morning.   Marland Kitchen  Calcium-Phosphorus-Vitamin D (CALCIUM/D3 ADULT GUMMIES PO) Take 500 mg by mouth daily.  . cholecalciferol (VITAMIN D) 1000 units tablet Take 2,000 Units by mouth daily.  . diazepam (VALIUM) 5 MG tablet Take 1 tablet (5 mg total) by mouth 3 (three) times daily as needed.  Marland Kitchen ESTRACE VAGINAL 0.1 MG/GM vaginal cream INSERT ONE APPLICATORFUL INTO THE VAGINA AT BEDTIME  . furosemide (LASIX) 40 MG tablet TAKE 1 TABLET BY MOUTH EVERY DAY (Patient taking differently: TAKE 1 TABLET BY MOUTH EVERY DAY AS NEEDED)  . HYDROcodone-acetaminophen (NORCO/VICODIN) 5-325 MG per tablet Take 1 tablet by mouth 2 (two) times daily as needed. for pain  . Multiple Vitamin (MULTIVITAMIN) tablet Take 1 tablet by mouth every morning.   Marland Kitchen PROAIR HFA 108 (90 BASE) MCG/ACT inhaler INHALE 2 PUFFS EVERY 4 HOURS AS NEEDED  . SYNTHROID 88 MCG tablet TAKE 1 TABLET (88 MCG TOTAL) BY MOUTH DAILY BEFORE BREAKFAST.  Marland Kitchen vitamin B-12 (CYANOCOBALAMIN) 500 MCG tablet Take 500 mcg by  mouth daily.  . [DISCONTINUED] amoxicillin (AMOXIL) 875 MG tablet Take 1 tablet (875 mg total) by mouth 2 (two) times daily. (Patient not taking: Reported on 05/10/2015)  . [DISCONTINUED] fluticasone (FLONASE) 50 MCG/ACT nasal spray Place into both nostrils daily.  . [DISCONTINUED] SYNTHROID 88 MCG tablet Take 1 tablet (88 mcg total) by mouth daily before breakfast.   No facility-administered encounter medications on file as of 02/21/2016.    Activities of Daily Living In your present state of health, do you have any difficulty performing the following activities: 02/21/2016 02/21/2016  Hearing? - Y  Difficulty concentrating or making decisions? N -  Walking or climbing stairs? N -  Dressing or bathing? N -  Doing errands, shopping? N -  Preparing Food and eating ? N -  Using the Toilet? N -  In the past six months, have you accidently leaked urine? Y Y  Do you have problems with loss of bowel control? N -  Managing your Medications? N -  Managing your Finances? N -  Housekeeping or managing your Housekeeping? N -    Patient Care Team: Abner Greenspan, MD as PCP - General  Dr. Ninfa Linden - Orthopedic Dr. Merla Riches - Optometry    Assessment:      Hearing Screening   125Hz  250Hz  500Hz  1000Hz  2000Hz  4000Hz  8000Hz   Right ear:   40 40 40 0   Left ear:   40 40 40 40   Vision Screening Comments: Last eye exam in Jan 2017   Exercise Activities and Dietary recommendations Current Exercise Habits: Home exercise routine, Type of exercise: Other - see comments (stationary bike), Time (Minutes): 30, Frequency (Times/Week): 2, Weekly Exercise (Minutes/Week): 60, Intensity: Mild, Exercise limited by: None identified  Fall Risk Fall Risk  02/21/2016 02/21/2016 02/21/2016 02/15/2015 12/15/2013  Falls in the past year? (No Data) Yes Yes No No  Number falls in past yr: - 1 1 - -  Injury with Fall? - Yes Yes - -  Follow up - Falls evaluation completed Falls evaluation completed - -   Depression  Screen PHQ 2/9 Scores 02/21/2016 02/21/2016 02/21/2016 02/15/2015  PHQ - 2 Score 0 0 0 0     Cognitive Testing MMSE - Mini Mental State Exam 02/21/2016  Orientation to time 5  Orientation to Place 5  Registration 3  Attention/ Calculation 0  Recall 3  Language- name 2 objects 0  Language- repeat 1  Language- follow 3 step command 3  Language- read & follow direction  0  Write a sentence 0  Copy design 0  Total score 20   PLEASE NOTE: A Mini-Cog screen was completed. Maximum score is 20. A value of 0 denotes this part of [place] MMSE was not completed.  Orientation to Time - Max 5 Orientation to Place - Max 5 Registration - Max 3 Recall - Max 3 Language Repeat - Max 1 Language Follow 3 Step Command - Max 3  Immunization History  Administered Date(s) Administered  . Influenza Split 08/30/2011, 08/19/2012  . Influenza Whole 09/01/2004, 10/10/2007, 08/25/2008, 08/29/2009, 08/22/2010  . Influenza,inj,Quad PF,36+ Mos 09/01/2013, 08/17/2015  . Pneumococcal Conjugate-13 02/15/2015  . Pneumococcal Polysaccharide-23 08/25/2008  . Td 03/03/2003, 12/12/2012   Screening Tests Health Maintenance  Topic Date Due  . ZOSTAVAX  12/12/2021 (Originally 01/11/1995)  . MAMMOGRAM  03/09/2016  . INFLUENZA VACCINE  06/26/2016  . TETANUS/TDAP  12/12/2022  . DEXA SCAN  Completed  . PNA vac Low Risk Adult  Completed      Plan:     I have personally reviewed and addressed the Medicare Annual Wellness questionnaire and have noted the following in the patient's chart:  A. Medical and social history B. Use of alcohol, tobacco or illicit drugs  C. Current medications and supplements D. Functional ability and status E.  Nutritional status F.  Physical activity G. Advance directives H. List of other physicians I.  Hospitalizations, surgeries, and ER visits in previous 12 months J.  [place] to include hearing, vision, cognitive, depression L. Referrals and appointments - none  In  addition, I have reviewed and discussed with patient certain preventive protocols, quality metrics, and best practice recommendations. A written personalized care plan for preventive services as well as general preventive health recommendations were provided to patient.  See attached scanned questionnaire for additional information.   Signed,   Lindell Noe, MHA, BS, LPN Health Advisor [id number]

## 2016-02-21 NOTE — Patient Instructions (Signed)
Urine drug screen today  Blood pressure is better on the 2nd check  Make sure to schedule your own mammogram  Stay active  Take care of yourself  Eat frequent small meals with protein for blood sugar  Labs are stable

## 2016-02-22 NOTE — Progress Notes (Signed)
   Subjective:    Patient ID: Carla Kelly, female    DOB: 04-26-1933, 82 y.o.   MRN: [id number]  HPI    Review of Systems     Objective:   Physical Exam        Assessment & Plan:  I reviewed health advisor's note, was available for consultation, and agree with documentation and plan.

## 2016-02-22 NOTE — Progress Notes (Signed)
   Subjective:    Patient ID: Carla Kelly, female    DOB: 08/18/1933, 82 y.o.   MRN: [id number]  HPI    Review of Systems     Objective:   Physical Exam        Assessment & Plan:  I reviewed health advisor's note, was available for consultation, and agree with documentation and plan.

## 2016-03-07 ENCOUNTER — Encounter: Payer: Self-pay | Admitting: Family Medicine

## 2016-03-20 ENCOUNTER — Other Ambulatory Visit: Payer: Self-pay | Admitting: Family Medicine

## 2016-03-21 NOTE — Telephone Encounter (Signed)
Last refilled on 12/09/15 #90 with 0 refills, pt had her CPE on 02/21/16 and has her next year's CPE scheduled too, please advise

## 2016-03-21 NOTE — Telephone Encounter (Signed)
Px written for call in   

## 2016-03-21 NOTE — Telephone Encounter (Signed)
Rx called in as prescribed 

## 2016-03-23 NOTE — Telephone Encounter (Signed)
Pt request status of diazepam refill; [facility] at [facility] said rx ready for pick up.pt will ck with pharmacy.

## 2016-04-17 ENCOUNTER — Other Ambulatory Visit: Payer: Self-pay

## 2016-04-17 DIAGNOSIS — Z1231 Encounter for screening mammogram for malignant neoplasm of breast: Secondary | ICD-10-CM

## 2016-04-27 ENCOUNTER — Ambulatory Visit
Admission: RE | Admit: 2016-04-27 | Discharge: 2016-04-27 | Disposition: A | Discharge status: Home / Self Care | Payer: Medicare Other | Source: Ambulatory Visit

## 2016-04-27 DIAGNOSIS — Z1231 Encounter for screening mammogram for malignant neoplasm of breast: Secondary | ICD-10-CM

## 2016-06-13 ENCOUNTER — Other Ambulatory Visit: Payer: Self-pay | Admitting: *Deleted

## 2016-06-13 MED ORDER — DIAZEPAM 5 MG PO TABS: ORAL_TABLET | ORAL | Status: DC

## 2016-06-13 NOTE — Telephone Encounter (Signed)
Pt had CPE on 02/21/16 and has her next years scheduled, last filled on 03/21/16 #90 with 0 refills, please advise

## 2016-06-13 NOTE — Telephone Encounter (Signed)
Px written for call in   

## 2016-06-13 NOTE — Telephone Encounter (Signed)
Rx called in as prescribed 

## 2016-08-17 ENCOUNTER — Ambulatory Visit: Payer: Medicare Other

## 2016-08-17 ENCOUNTER — Ambulatory Visit (INDEPENDENT_AMBULATORY_CARE_PROVIDER_SITE_OTHER): Payer: Medicare Other

## 2016-08-17 DIAGNOSIS — Z23 Encounter for immunization: Secondary | ICD-10-CM

## 2016-08-29 ENCOUNTER — Ambulatory Visit (INDEPENDENT_AMBULATORY_CARE_PROVIDER_SITE_OTHER)
Admission: RE | Admit: 2016-08-29 | Discharge: 2016-08-29 | Disposition: A | Discharge status: Home / Self Care | Payer: Medicare Other | Source: Ambulatory Visit | Attending: Family Medicine | Admitting: Family Medicine

## 2016-08-29 ENCOUNTER — Ambulatory Visit (INDEPENDENT_AMBULATORY_CARE_PROVIDER_SITE_OTHER): Payer: Medicare Other | Admitting: Family Medicine

## 2016-08-29 ENCOUNTER — Encounter: Payer: Self-pay | Admitting: Family Medicine

## 2016-08-29 VITALS — BP 128/70 | HR 78 | Temp 98.2°F | Ht 66.5 in | Wt 155.2 lb

## 2016-08-29 DIAGNOSIS — R109 Unspecified abdominal pain: Secondary | ICD-10-CM | POA: Insufficient documentation

## 2016-08-29 DIAGNOSIS — R0789 Other chest pain: Secondary | ICD-10-CM

## 2016-08-29 DIAGNOSIS — J984 Other disorders of lung: Secondary | ICD-10-CM | POA: Diagnosis not present

## 2016-08-29 DIAGNOSIS — R1011 Right upper quadrant pain: Secondary | ICD-10-CM | POA: Diagnosis not present

## 2016-08-29 DIAGNOSIS — R8290 Unspecified abnormal findings in urine: Secondary | ICD-10-CM | POA: Diagnosis not present

## 2016-08-29 DIAGNOSIS — R10A0 Flank pain, unspecified side: Secondary | ICD-10-CM

## 2016-08-29 LAB — POC URINALSYSI DIPSTICK (AUTOMATED)
Glucose, UA: NEGATIVE
KETONES UA: NEGATIVE
Nitrite, UA: NEGATIVE
PH UA: 6
Spec Grav, UA: >=1.03
Urobilinogen, UA: 0.2

## 2016-08-29 NOTE — Progress Notes (Signed)
Subjective:    Patient ID: Carla Kelly, female    DOB: 1933-03-15, 82 y.o.   MRN: [id number]  HPI  Here for R sided rib pain that radiates through to her back  About 4-5 months  No accident / injury or trauma or new exercise program  Did work in the yard this summer   No rash- she has had shingles in the past  Thinks her abdomen is more bloated and swollen than usual  Starts in the front under rib cage and goes straight through to back   Bending over makes it worse  Dull ache when lying in bed  Has not tried ice Heat helped a bit   No cough or fever  No n/v  Has been a little dizzy  Bowel habits are the same (has ibs)  Wt Readings from Last 3 Encounters:  08/29/16 155 lb 4 oz (70.4 kg)  02/21/16 153 lb 8 oz (69.6 kg)  02/21/16 153 lb 8 oz (69.6 kg)    Colonoscopy was 7/11 with polyps  CCY 2004 No problems since  Has never had a CBD stone  Patient Active Problem List   Diagnosis Date Noted  . RUQ pain 08/29/2016  . Chest wall pain 08/29/2016  . Flank pain 08/29/2016  . Routine general medical examination at a health care facility 02/12/2016  . Lump of breast, left 03/07/2015  . Estrogen deficiency 02/15/2015  . Colon cancer screening 02/15/2015  . Arthritis of right knee 08/13/2014  . Status post total right knee replacement 08/13/2014  . Amnesia 02/20/2014  . Transient global amnesia 02/20/2014  . Encounter for Medicare annual wellness exam 12/07/2013  . Allergic drug rash 03/16/2013  . Psoriasis 02/16/2013  . Other screening mammogram 08/19/2012  . Pedal edema 03/11/2012  . B12 deficiency 09/05/2009  . Vitamin D deficiency 11/08/2008  . Hyperlipidemia 08/25/2008  . Osteopenia 08/25/2008  . Personal history of colonic polyps 08/25/2008  . HERPES ZOSTER, HX OF 06/03/2008  . Hypothyroidism 08/04/2007  . HYPOGLYCEMIA 08/04/2007  . ASTHMA 08/04/2007  . IRRITABLE BOWEL SYNDROME 08/04/2007  . VAGINITIS, ATROPHIC 08/04/2007  . Osteoarthrosis, unspecified  whether generalized or localized, unspecified site 08/04/2007  . FIBROMYALGIA 08/04/2007  . URINARY INCONTINENCE 08/04/2007   Past Medical History:  Diagnosis Date  . Adenomatous colon polyp   . Asthma   . Depression   . Disorder of bone and cartilage, unspecified   . Embolism and thrombosis of unspecified site   . Fibromyalgia   . History of herpes zoster   . HLD (hyperlipidemia)   . Hypoglycemia   . Hypothyroidism   . IBS (irritable bowel syndrome)   . Mitral valve disorder    "leaking mitral valve-very mild"  . Orbital floor fracture ([place]) 10/16   with a fall   . Osteoarthritis   . Osteoarthrosis, unspecified whether generalized or localized, unspecified site   . Other B-complex deficiencies   . Psoriasis   . Stroke ([place])   . Swelling of extremity, right    right leg  . 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 Unspecified urinary incontinence   . Unspecified vitamin D deficiency    Past Surgical History:  Procedure Laterality Date  . ABDOMINAL HYSTERECTOMY  1970   fibroids; endometriosis  . APPENDECTOMY    . bladder tack  5/05  . CATARACT EXTRACTION Right 12/13   Dr George Ina  . CATARACT EXTRACTION W/PHACO Left 05/10/2015   Procedure: CATARACT EXTRACTION PHACO AND INTRAOCULAR LENS PLACEMENT (IOC);  Surgeon: Birder Robson, MD;  Location: [facility];  Service: Ophthalmology;  Laterality: Left;  [place] 00:59 AP% 26.7 CDE 15.85 fluid pack lot#[id number]  . CERVICAL DISC SURGERY     x 2  . CHOLECYSTECTOMY  2004  . COLONOSCOPY  2/02   polyps, BE-polyps  . EYE SURGERY     right eye  . MOUTH SURGERY     lower teeth have 4 studs to keep in place  . TONSILLECTOMY    . TOTAL KNEE ARTHROPLASTY Right 08/13/2014   Procedure: RIGHT TOTAL KNEE ARTHROPLASTY;  Surgeon: Mcarthur Rossetti, MD;  Location: [facility];  Service: Orthopedics;  Laterality: Right;  . TYMPANOPLASTY    . US TRANSVAGINAL PELVIC MODIFIED  10/02   small left ovarian cyst   Social History  Substance Use  Topics  . Smoking status: Never Smoker  . Smokeless tobacco: Never Used  . Alcohol use No     Comment: "is allergic"   Family History  Problem Relation Age of Onset  . Heart attack Mother   . Stroke Mother     several  . Lupus Brother   . Other Sister     benign brain tumor  . Asthma Daughter   . Other Brother     heart problems  . Colon cancer      1st cousin   Allergies  Allergen Reactions  . Alcohol-Sulfur [Sulfur] Shortness Of Breath and Swelling    Alcohol po swells throat  . Ergocalciferol Other (See Comments)    REACTION: GI side effects  . Morphine Nausea And Vomiting  . Tolterodine Tartrate Other (See Comments)    REACTION: ? reaction  . Myrbetriq [Mirabegron] Other (See Comments)    GI Upset  . Nortriptyline Hcl Other (See Comments)    unknown   Current Outpatient Prescriptions on File Prior to Visit  Medication Sig Dispense Refill  . albuterol (PROVENTIL) 4 MG tablet TAKE 1 TABLET BY MOUTH EVERY 4 HOURS AS NEEDED. 120 tablet 1  . aspirin 325 MG tablet Take 325 mg by mouth daily.    . Biotin 2500 MCG CAPS Take 2,500 mcg by mouth daily.    . Calcium 1500 MG tablet Take 1,500 mg by mouth every morning.     . Calcium-Phosphorus-Vitamin D (CALCIUM/D3 ADULT GUMMIES PO) Take 500 mg by mouth daily.    . cholecalciferol (VITAMIN D) 1000 units tablet Take 2,000 Units by mouth daily.    . diazepam (VALIUM) 5 MG tablet take 1 tablet by mouth three times a day if needed 90 tablet 0  . furosemide (LASIX) 40 MG tablet TAKE 1 TABLET BY MOUTH EVERY DAY (Patient taking differently: TAKE 1 TABLET BY MOUTH EVERY DAY AS NEEDED) 30 tablet 5  . HYDROcodone-acetaminophen (NORCO/VICODIN) 5-325 MG per tablet Take 1 tablet by mouth 2 (two) times daily as needed. for pain  0  . Multiple Vitamin (MULTIVITAMIN) tablet Take 1 tablet by mouth every morning.     Marland Kitchen PROAIR HFA 108 (90 BASE) MCG/ACT inhaler INHALE 2 PUFFS EVERY 4 HOURS AS NEEDED 8.5 each 5  . SYNTHROID 88 MCG tablet TAKE 1  TABLET (88 MCG TOTAL) BY MOUTH DAILY BEFORE BREAKFAST. 90 tablet 3  . vitamin B-12 (CYANOCOBALAMIN) 500 MCG tablet Take 500 mcg by mouth daily.     No current facility-administered medications on file prior to visit.     Review of Systems Review of Systems  Constitutional: Negative for fever, appetite change, fatigue and unexpected weight change.  Eyes: Negative  for pain and visual disturbance.  Respiratory: Negative for cough and shortness of breath.  pos for posterior-lateral R CWP Cardiovascular: Negative for cp or palpitations    Gastrointestinal: Negative for nausea, diarrhea and constipation. pos for RUQ and R flank pain  Genitourinary: Negative for urgency and frequency. neg for hematuria or dysuria Skin: Negative for pallor or rash   Neurological: Negative for weakness, light-headedness, numbness and headaches.  Hematological: Negative for adenopathy. Does not bruise/bleed easily.  Psychiatric/Behavioral: Negative for dysphoric mood. The patient is not nervous/anxious.         Objective:   Physical Exam  Constitutional: She appears well-developed and well-nourished. No distress.  Well appearing elderly female   HENT:  Head: Normocephalic and atraumatic.  Mouth/Throat: Oropharynx is clear and moist.  Eyes: Conjunctivae and EOM are normal. Pupils are equal, round, and reactive to light. No scleral icterus.  No icterus   Neck: Normal range of motion. Neck supple.  Cardiovascular: Normal rate, regular rhythm and normal heart sounds.   Pulmonary/Chest: Effort normal and breath sounds normal. No respiratory distress. She has no wheezes. She has no rales. She exhibits tenderness.  R lower lat/post rib tenderness w/o step off/crepitus or skin change  Nl bs in all areas with good air exch  Abdominal: Soft. Bowel sounds are normal. She exhibits no distension, no abdominal bruit, no ascites, no pulsatile midline mass and no mass. There is no hepatosplenomegaly. There is tenderness in  the right upper quadrant. There is CVA tenderness. There is no rigidity, no rebound, no guarding, no tenderness at McBurney's point and negative Murphy's sign.  Mild R CVA tenderness  Musculoskeletal:  No acute joint changes No TS or LS tenderness  Lymphadenopathy:    She has no cervical adenopathy.  Neurological: She is alert. No cranial nerve deficit. She exhibits normal muscle tone. Coordination normal.  Skin: Skin is warm and dry. No rash noted. No erythema. No pallor.  Psychiatric: She has a normal mood and affect.          Assessment & Plan:   Problem List Items Addressed This Visit      Other   Chest wall pain    Along with flank and soft tissue ruq pain  cxr ordered as well as labs No pleuritic symptoms Ongoing for 4 mo but recently worse w/o any breathing symptoms       Relevant Orders   DG Chest 2 View (Completed)   Flank pain    With RUQ pain and cwp on the R as well  Tenderness of soft tissue and bone as well (ribs)  ua pos for wbc/some blood -cx pending  Lab pending and also cxr  inst to seek urgent care and alert [place] if symptoms suddenly worsen in the meantime This has been 4 mo of symptoms- but worse lately      Relevant Orders   DG Chest 2 View (Completed)   POCT Urinalysis Dipstick (Automated) (Completed)   RUQ pain    In elderly female with hx of CCY in the past She has tenderness in soft tissue and also ribs from front to back cxr ordered ua ordered  Labs ordered incl lft/panc enzymes  Diff is wide incl cbd stone/msk/renal cause  Plan following results       Relevant Orders   CBC with Differential/Platelet   Hepatic function panel   Amylase   Lipase    Other Visit Diagnoses    Abnormal urinalysis    -  Primary  Relevant Orders   Urine culture

## 2016-08-29 NOTE — Patient Instructions (Signed)
Labs today  Urinalysis today  Chest xray today  Keep using heat Alert me if symptoms suddenly worsen  Plan to follow after results

## 2016-08-29 NOTE — Assessment & Plan Note (Signed)
In elderly female with hx of CCY in the past She has tenderness in soft tissue and also ribs from front to back cxr ordered ua ordered  Labs ordered incl lft/panc enzymes  Diff is wide incl cbd stone/msk/renal cause  Plan following results

## 2016-08-29 NOTE — Assessment & Plan Note (Signed)
Along with flank and soft tissue ruq pain  cxr ordered as well as labs No pleuritic symptoms Ongoing for 4 mo but recently worse w/o any breathing symptoms

## 2016-08-29 NOTE — Assessment & Plan Note (Signed)
With RUQ pain and cwp on the R as well  Tenderness of soft tissue and bone as well (ribs)  ua pos for wbc/some blood -cx pending  Lab pending and also cxr  inst to seek urgent care and alert [place] if symptoms suddenly worsen in the meantime This has been 4 mo of symptoms- but worse lately

## 2016-08-29 NOTE — Progress Notes (Signed)
Pre visit review using our clinic review tool, if applicable. No additional management support is needed unless otherwise documented below in the visit note. 

## 2016-08-30 LAB — HEPATIC FUNCTION PANEL
ALT: 11 U/L (ref 0–35)
AST: 17 U/L (ref 0–37)
Albumin: 4 g/dL (ref 3.5–5.2)
Alkaline Phosphatase: 71 U/L (ref 39–117)
BILIRUBIN TOTAL: 0.3 mg/dL (ref 0.2–1.2)
Bilirubin, Direct: 0 mg/dL (ref 0.0–0.3)
Total Protein: 7.2 g/dL (ref 6.0–8.3)

## 2016-08-30 LAB — AMYLASE: AMYLASE: 39 U/L (ref 27–131)

## 2016-08-30 LAB — CBC WITH DIFFERENTIAL/PLATELET
BASOS PCT: 0.8 % (ref 0.0–3.0)
Basophils Absolute: 0 10*3/uL (ref 0.0–0.1)
EOS PCT: 2.3 % (ref 0.0–5.0)
Eosinophils Absolute: 0.1 10*3/uL (ref 0.0–0.7)
HCT: 40 % (ref 36.0–46.0)
Hemoglobin: 13.3 g/dL (ref 12.0–15.0)
LYMPHS ABS: 2.6 10*3/uL (ref 0.7–4.0)
Lymphocytes Relative: 43.6 % (ref 12.0–46.0)
MCHC: 33.2 g/dL (ref 30.0–36.0)
MCV: 92.8 fl (ref 78.0–100.0)
MONO ABS: 0.3 10*3/uL (ref 0.1–1.0)
Monocytes Relative: 5.2 % (ref 3.0–12.0)
NEUTROS PCT: 48.1 % (ref 43.0–77.0)
Neutro Abs: 2.9 10*3/uL (ref 1.4–7.7)
Platelets: 208 10*3/uL (ref 150.0–400.0)
RBC: 4.31 Mil/uL (ref 3.87–5.11)
RDW: 13.4 % (ref 11.5–15.5)
WBC: 6 10*3/uL (ref 4.0–10.5)

## 2016-08-30 LAB — LIPASE: LIPASE: 39 U/L (ref 11.0–59.0)

## 2016-08-31 ENCOUNTER — Encounter: Payer: Self-pay | Admitting: Family Medicine

## 2016-08-31 LAB — URINE CULTURE

## 2016-09-02 MED ORDER — SULFAMETHOXAZOLE-TRIMETHOPRIM 800-160 MG PO TABS: 1.0000 | ORAL_TABLET | Freq: Two times a day (BID) | ORAL | 0 refills | Status: DC

## 2016-09-02 NOTE — Addendum Note (Signed)
Addended by: Loura Pardon A on: 09/02/2016 12:26 PM   Modules accepted: Orders

## 2016-10-05 ENCOUNTER — Other Ambulatory Visit: Payer: Self-pay | Admitting: Family Medicine

## 2016-10-08 NOTE — Telephone Encounter (Signed)
Px written for call in   

## 2016-10-08 NOTE — Telephone Encounter (Signed)
Rx called in as prescribed 

## 2016-10-08 NOTE — Telephone Encounter (Signed)
CPE scheduled 03/04/17 ([facility]V scheduled too), last filled on 06/13/16 #90 with 0 refills, please advise

## 2016-11-01 ENCOUNTER — Encounter (INDEPENDENT_AMBULATORY_CARE_PROVIDER_SITE_OTHER): Payer: Self-pay | Admitting: Physician Assistant

## 2016-11-01 ENCOUNTER — Ambulatory Visit (INDEPENDENT_AMBULATORY_CARE_PROVIDER_SITE_OTHER): Payer: Medicare Other | Admitting: Physician Assistant

## 2016-11-01 ENCOUNTER — Ambulatory Visit (INDEPENDENT_AMBULATORY_CARE_PROVIDER_SITE_OTHER): Payer: Medicare Other

## 2016-11-01 DIAGNOSIS — M25561 Pain in right knee: Secondary | ICD-10-CM

## 2016-11-01 MED ORDER — HYDROCODONE-ACETAMINOPHEN 5-325 MG PO TABS: 1.0000 | ORAL_TABLET | Freq: Two times a day (BID) | ORAL | 0 refills | Status: DC | PRN

## 2016-11-01 NOTE — Progress Notes (Signed)
Office Visit Note   Patient: Carla Kelly           Date of Birth: May 26, 1933           MRN: [id number] Visit Date: 11/01/2016              Requested by: Abner Greenspan, MD [place], [place] [address] PCP: Loura Pardon, MD   Assessment & Plan: Visit Diagnoses:  1. Acute pain of right knee     Plan: Right knee aspirate sent for culture and sensitivity, crystals, cell count and Gram stain. We'll obtain a CBC, CRP and sedimentation rate. Also will get 3 phase bone scan to rule out loosening of the right total knee.  Follow-Up Instructions: Return in about 3 weeks (around 11/22/2016).   Orders:  Orders Placed This Encounter  Procedures  . XR Knee 1-2 Views Right  . NM Bone Scan 3 Phase Lower Extremity   Meds ordered this encounter  Medications  . HYDROcodone-acetaminophen (NORCO/VICODIN) 5-325 MG tablet    Sig: Take 1 tablet by mouth 2 (two) times daily as needed. for pain    Dispense:  30 tablet    Refill:  0      Procedures: No procedures performed   Clinical Data: No additional findings.   Subjective: Chief Complaint  Patient presents with  . Right Knee - Follow-up  . Follow-up    Patient is here to have right knee checked out.  Had a Right TKA in 08/13/2014.  States she had a fall last year and hit the right knee.  Started having knee issues 6 months ago and has gotten worse within the last 3 months.  Swelling at times after standing for a long time.  States she's been staying with her daughter who unfortunately had bilateral mastectomy secondary to breast cancer and Paget's disease. Therefore she's been going up and down a lot of steps over the last 3 months. She states she is having significant swelling of the knee today tickly after standing any period of time. He denies any redness or warmth of the right knee. Has mainly lateral and anterior right knee pain.    Review of Systems Denies fevers chills nausea vomiting.  Objective: Vital  Signs: There were no vitals taken for this visit.  Physical Exam  Constitutional: She is oriented to person, place, and time. She appears well-developed and well-nourished. No distress.  Eyes: EOM are normal.  Pulmonary/Chest: Effort normal.  Neurological: She is alert and oriented to person, place, and time.  Skin: Skin is warm and dry. No rash noted. She is not diaphoretic. No erythema.  Psychiatric: She has a normal mood and affect. Her behavior is normal.    Ortho Exam Bilateral knees full range of motion without significant pain. No instability with valgus varus stressing of either knee. Right knee with slight effusion no erythema or edema. Right knee tenderness along the lateral joint line and also the pes anserinus region. Specialty Comments:  No specialty comments available.  Imaging: Xr Knee 1-2 Views Right  Result Date: 11/01/2016 Right knee 2 views: No acute fracture no bony abnormalities. Knee is well located. Status post right total knee arthroplasty without any hardware failure. All components appear well seated.    PMFS History: Patient Active Problem List   Diagnosis Date Noted  . RUQ pain 08/29/2016  . Chest wall pain 08/29/2016  . Flank pain 08/29/2016  . Routine general medical examination at a health care  facility 02/12/2016  . Lump of breast, left 03/07/2015  . Estrogen deficiency 02/15/2015  . Colon cancer screening 02/15/2015  . Arthritis of right knee 08/13/2014  . Status post total right knee replacement 08/13/2014  . Amnesia 02/20/2014  . Transient global amnesia 02/20/2014  . Encounter for Medicare annual wellness exam 12/07/2013  . Allergic drug rash 03/16/2013  . Psoriasis 02/16/2013  . Other screening mammogram 08/19/2012  . Pedal edema 03/11/2012  . B12 deficiency 09/05/2009  . Vitamin D deficiency 11/08/2008  . Hyperlipidemia 08/25/2008  . Osteopenia 08/25/2008  . Personal history of colonic polyps 08/25/2008  . HERPES ZOSTER, HX OF  06/03/2008  . Hypothyroidism 08/04/2007  . HYPOGLYCEMIA 08/04/2007  . ASTHMA 08/04/2007  . IRRITABLE BOWEL SYNDROME 08/04/2007  . VAGINITIS, ATROPHIC 08/04/2007  . Osteoarthrosis, unspecified whether generalized or localized, unspecified site 08/04/2007  . FIBROMYALGIA 08/04/2007  . URINARY INCONTINENCE 08/04/2007   Past Medical History:  Diagnosis Date  . Adenomatous colon polyp   . Asthma   . Depression   . Disorder of bone and cartilage, unspecified   . Embolism and thrombosis of unspecified site   . Fibromyalgia   . History of herpes zoster   . HLD (hyperlipidemia)   . Hypoglycemia   . Hypothyroidism   . IBS (irritable bowel syndrome)   . Mitral valve disorder    "leaking mitral valve-very mild"  . Orbital floor fracture ([place]) 10/16   with a fall   . Osteoarthritis   . Osteoarthrosis, unspecified whether generalized or localized, unspecified site   . Other B-complex deficiencies   . Psoriasis   . Stroke ([place])   . Swelling of extremity, right    right leg  . 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 Unspecified urinary incontinence   . Unspecified vitamin D deficiency     Family History  Problem Relation Age of Onset  . Heart attack Mother   . Stroke Mother     several  . Lupus Brother   . Other Sister     benign brain tumor  . Asthma Daughter   . Breast cancer Daughter   . Other Brother     heart problems  . Colon cancer      1st cousin    Past Surgical History:  Procedure Laterality Date  . ABDOMINAL HYSTERECTOMY  1970   fibroids; endometriosis  . APPENDECTOMY    . bladder tack  5/05  . CATARACT EXTRACTION Right 12/13   Dr George Ina  . CATARACT EXTRACTION W/PHACO Left 05/10/2015   Procedure: CATARACT EXTRACTION PHACO AND INTRAOCULAR LENS PLACEMENT (IOC);  Surgeon: Birder Robson, MD;  Location: [facility] ORS;  Service: Ophthalmology;  Laterality: Left;  [place] 00:59 AP% 26.7 CDE 15.85 fluid pack lot#[id number] H  . CERVICAL DISC SURGERY     x 2  .  CHOLECYSTECTOMY  2004  . COLONOSCOPY  2/02   polyps, BE-polyps  . EYE SURGERY     right eye  . MOUTH SURGERY     lower teeth have 4 studs to keep in place  . TONSILLECTOMY    . TOTAL KNEE ARTHROPLASTY Right 08/13/2014   Procedure: RIGHT TOTAL KNEE ARTHROPLASTY;  Surgeon: Mcarthur Rossetti, MD;  Location: [facility];  Service: Orthopedics;  Laterality: Right;  . TYMPANOPLASTY    . US TRANSVAGINAL PELVIC MODIFIED  10/02   small left ovarian cyst   Social History   Occupational History  . Retired    Social History Main Topics  . Smoking status: Never Smoker  .  Smokeless tobacco: Never Used  . Alcohol use No     Comment: "is allergic"  . Drug use: No  . Sexual activity: No

## 2016-11-02 LAB — SEDIMENTATION RATE: Sed Rate: 4 mm/hr (ref 0–30)

## 2016-11-02 LAB — SYNOVIAL CELL COUNT + DIFF, W/ CRYSTALS
Basophils, %: 0 %
EOSINOPHILS-SYNOVIAL: 1 % (ref 0–2)
Lymphocytes-Synovial Fld: 53 % (ref 0–74)
Monocyte/Macrophage: 27 % (ref 0–69)
NEUTROPHIL, SYNOVIAL: 19 % (ref 0–24)
Synoviocytes, %: 0 % (ref 0–15)
WBC, SYNOVIAL: 485 {cells}/uL — AB (ref <150)

## 2016-11-02 LAB — CBC WITH DIFFERENTIAL/PLATELET
Basophils Absolute: 54 cells/uL (ref 0–200)
Basophils Relative: 1 %
EOS ABS: 108 {cells}/uL (ref 15–500)
Eosinophils Relative: 2 %
HEMATOCRIT: 40.9 % (ref 35.0–45.0)
HEMOGLOBIN: 13.2 g/dL (ref 11.7–15.5)
LYMPHS ABS: 2268 {cells}/uL (ref 850–3900)
LYMPHS PCT: 42 %
MCH: 30.2 pg (ref 27.0–33.0)
MCHC: 32.3 g/dL (ref 32.0–36.0)
MCV: 93.6 fL (ref 80.0–100.0)
MONO ABS: 324 {cells}/uL (ref 200–950)
MPV: 11 fL (ref 7.5–12.5)
Monocytes Relative: 6 %
NEUTROS PCT: 49 %
Neutro Abs: 2646 cells/uL (ref 1500–7800)
Platelets: 208 10*3/uL (ref 140–400)
RBC: 4.37 MIL/uL (ref 3.80–5.10)
RDW: 13.8 % (ref 11.0–15.0)
WBC: 5.4 10*3/uL (ref 3.8–10.8)

## 2016-11-02 LAB — C-REACTIVE PROTEIN: CRP: 1 mg/L (ref <8.0)

## 2016-11-05 LAB — BODY FLUID CULTURE
Gram Stain: NONE SEEN
Organism ID, Bacteria: NO GROWTH

## 2016-11-08 NOTE — Addendum Note (Signed)
Addended by: Daylene Posey T on: 11/08/2016 09:44 AM   Modules accepted: Orders

## 2016-11-12 ENCOUNTER — Ambulatory Visit (INDEPENDENT_AMBULATORY_CARE_PROVIDER_SITE_OTHER): Payer: Medicare Other | Admitting: Physician Assistant

## 2016-11-13 ENCOUNTER — Other Ambulatory Visit (HOSPITAL_COMMUNITY): Payer: Medicare Other

## 2016-11-13 ENCOUNTER — Ambulatory Visit (HOSPITAL_COMMUNITY): Payer: Medicare Other

## 2017-01-14 ENCOUNTER — Encounter (INDEPENDENT_AMBULATORY_CARE_PROVIDER_SITE_OTHER): Payer: Self-pay | Admitting: *Deleted

## 2017-01-15 ENCOUNTER — Other Ambulatory Visit: Payer: Self-pay | Admitting: Family Medicine

## 2017-01-15 NOTE — Telephone Encounter (Signed)
CPE scheduled on 03/04/17, last filled on 10/08/16 #90 tabs with 0 refills

## 2017-01-15 NOTE — Telephone Encounter (Signed)
Px written for call in   

## 2017-01-15 NOTE — Telephone Encounter (Signed)
Rx called in as prescribed 

## 2017-01-18 ENCOUNTER — Telehealth (INDEPENDENT_AMBULATORY_CARE_PROVIDER_SITE_OTHER): Payer: Self-pay | Admitting: Orthopaedic Surgery

## 2017-01-18 NOTE — Telephone Encounter (Signed)
Patient called advised she has a cold and want to know if she can still get the bone scan and injection on Monday. The number to contact patient is [phone]

## 2017-01-18 NOTE — Telephone Encounter (Signed)
Patient aware she can have this test with a cold

## 2017-01-31 ENCOUNTER — Other Ambulatory Visit: Payer: Self-pay | Admitting: Family Medicine

## 2017-02-06 ENCOUNTER — Encounter
Admission: RE | Admit: 2017-02-06 | Discharge: 2017-02-06 | Disposition: A | Discharge status: Home / Self Care | Payer: Medicare Other | Source: Ambulatory Visit | Attending: Physician Assistant | Admitting: Physician Assistant

## 2017-02-06 DIAGNOSIS — M25561 Pain in right knee: Secondary | ICD-10-CM | POA: Diagnosis not present

## 2017-02-06 DIAGNOSIS — M7989 Other specified soft tissue disorders: Secondary | ICD-10-CM | POA: Diagnosis not present

## 2017-02-06 MED ORDER — TECHNETIUM TC 99M MEDRONATE IV KIT
25.0000 | PACK | Freq: Once | INTRAVENOUS | Status: AC | PRN
  Administered 2017-02-06: 22.56 via INTRAVENOUS

## 2017-02-13 ENCOUNTER — Ambulatory Visit (INDEPENDENT_AMBULATORY_CARE_PROVIDER_SITE_OTHER): Payer: Medicare Other | Admitting: Physician Assistant

## 2017-02-13 ENCOUNTER — Encounter (INDEPENDENT_AMBULATORY_CARE_PROVIDER_SITE_OTHER): Payer: Self-pay | Admitting: Physician Assistant

## 2017-02-13 DIAGNOSIS — M25561 Pain in right knee: Secondary | ICD-10-CM | POA: Diagnosis not present

## 2017-02-13 DIAGNOSIS — G8929 Other chronic pain: Secondary | ICD-10-CM

## 2017-02-13 MED ORDER — LIDOCAINE HCL 1 % IJ SOLN
3.0000 mL | INTRAMUSCULAR | Status: AC | PRN
  Administered 2017-02-13: 3 mL

## 2017-02-13 MED ORDER — METHYLPREDNISOLONE ACETATE 40 MG/ML IJ SUSP
40.0000 mg | INTRAMUSCULAR | Status: AC | PRN
  Administered 2017-02-13: 40 mg via INTRA_ARTICULAR

## 2017-02-13 NOTE — Progress Notes (Signed)
Office Visit Note   Patient: Carla Kelly           Date of Birth: 05-05-1933           MRN: [id number] Visit Date: 02/13/2017              Requested by: Abner Greenspan, MD [place], [place] [address] PCP: Loura Pardon, MD   Assessment & Plan: Visit Diagnoses: No diagnosis found.  Plan: We will see her back in 2 weeks' check see what type of relief she got with the injection. She will work on Forensic scientist.  Follow-Up Instructions: Return in about 2 weeks (around 02/27/2017).   Orders:  No orders of the defined types were placed in this encounter.  No orders of the defined types were placed in this encounter.     Procedures: Large Joint Inj Date/Time: 02/13/2017 5:15 PM Performed by: Pete Pelt Authorized by: Pete Pelt   Consent Given by:  Patient Indications:  Pain Location:  Knee Site:  R knee Needle Size:  22 G Approach:  Superolateral Ultrasound Guidance: No   Fluoroscopic Guidance: No   Medications:  40 mg methylPREDNISolone acetate 40 MG/ML; 3 mL lidocaine 1 % Aspiration Attempted: No   Patient tolerance:  Patient tolerated the procedure well with no immediate complications     Clinical Data: No additional findings.   Subjective: Chief Complaint  Patient presents with  . Right Knee - Follow-up, Pain    Patient returns to review NM Bone Scan. She states that her right knee does okay depending on what she does that day. She continues to have the "knot" that moves and states the fluid pill does not seem to help that. She takes 1/2 hydrocodone as needed for pain. She has cane with her today, but states that she does not have to use it inside. She does not walk outside without it.   Reviewed findings of the bone scan which did not show any evidence of loosening or infection. Also she had a normal sedimentation rate and CRP. From right knee showed no growth.  Review of Systems   Objective: Vital Signs: There were no  vitals taken for this visit.  Physical Exam  Ortho Exam Right knee no effusion she has tenderness along the lateral joint line of the knee and also the peds in [place] this area. No instability valgus varus stressing knee. No signs of infection. Surgical incision is well-healed .  Imaging: No results found.   PMFS History: Patient Active Problem List   Diagnosis Date Noted  . RUQ pain 08/29/2016  . Chest wall pain 08/29/2016  . Flank pain 08/29/2016  . Routine general medical examination at a health care facility 02/12/2016  . Lump of breast, left 03/07/2015  . Estrogen deficiency 02/15/2015  . Colon cancer screening 02/15/2015  . Arthritis of right knee 08/13/2014  . Status post total right knee replacement 08/13/2014  . Amnesia 02/20/2014  . Transient global amnesia 02/20/2014  . Encounter for Medicare annual wellness exam 12/07/2013  . Allergic drug rash 03/16/2013  . Psoriasis 02/16/2013  . Other screening mammogram 08/19/2012  . Pedal edema 03/11/2012  . B12 deficiency 09/05/2009  . Vitamin D deficiency 11/08/2008  . Hyperlipidemia 08/25/2008  . Osteopenia 08/25/2008  . Personal history of colonic polyps 08/25/2008  . HERPES ZOSTER, HX OF 06/03/2008  . Hypothyroidism 08/04/2007  . HYPOGLYCEMIA 08/04/2007  . ASTHMA 08/04/2007  . IRRITABLE BOWEL SYNDROME 08/04/2007  .  VAGINITIS, ATROPHIC 08/04/2007  . Osteoarthrosis, unspecified whether generalized or localized, unspecified site 08/04/2007  . FIBROMYALGIA 08/04/2007  . URINARY INCONTINENCE 08/04/2007   Past Medical History:  Diagnosis Date  . Adenomatous colon polyp   . Asthma   . Depression   . Disorder of bone and cartilage, unspecified   . Embolism and thrombosis of unspecified site   . Fibromyalgia   . History of herpes zoster   . HLD (hyperlipidemia)   . Hypoglycemia   . Hypothyroidism   . IBS (irritable bowel syndrome)   . Mitral valve disorder    "leaking mitral valve-very mild"  . Orbital floor  fracture ([place]) 10/16   with a fall   . Osteoarthritis   . Osteoarthrosis, unspecified whether generalized or localized, unspecified site   . Other B-complex deficiencies   . Psoriasis   . Stroke ([place])   . Swelling of extremity, right    right leg  . 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 Unspecified urinary incontinence   . Unspecified vitamin D deficiency     Family History  Problem Relation Age of Onset  . Heart attack Mother   . Stroke Mother     several  . Lupus Brother   . Other Sister     benign brain tumor  . Asthma Daughter   . Breast cancer Daughter   . Other Brother     heart problems  . Colon cancer      1st cousin    Past Surgical History:  Procedure Laterality Date  . ABDOMINAL HYSTERECTOMY  1970   fibroids; endometriosis  . APPENDECTOMY    . bladder tack  5/05  . CATARACT EXTRACTION Right 12/13   Dr George Ina  . CATARACT EXTRACTION W/PHACO Left 05/10/2015   Procedure: CATARACT EXTRACTION PHACO AND INTRAOCULAR LENS PLACEMENT (IOC);  Surgeon: Birder Robson, MD;  Location: [facility];  Service: Ophthalmology;  Laterality: Left;  [place] 00:59 AP% 26.7 CDE 15.85 fluid pack lot#[id number] H  . CERVICAL DISC SURGERY     x 2  . CHOLECYSTECTOMY  2004  . COLONOSCOPY  2/02   polyps, BE-polyps  . EYE SURGERY     right eye  . MOUTH SURGERY     lower teeth have 4 studs to keep in place  . TONSILLECTOMY    . TOTAL KNEE ARTHROPLASTY Right 08/13/2014   Procedure: RIGHT TOTAL KNEE ARTHROPLASTY;  Surgeon: Mcarthur Rossetti, MD;  Location: [facility];  Service: Orthopedics;  Laterality: Right;  . TYMPANOPLASTY    . US TRANSVAGINAL PELVIC MODIFIED  10/02   small left ovarian cyst   Social History   Occupational History  . Retired    Social History Main Topics  . Smoking status: Never Smoker  . Smokeless tobacco: Never Used  . Alcohol use No     Comment: "is allergic"  . Drug use: No  . Sexual activity: No

## 2017-02-17 ENCOUNTER — Telehealth: Payer: Self-pay | Admitting: Family Medicine

## 2017-02-17 DIAGNOSIS — E538 Deficiency of other specified B group vitamins: Secondary | ICD-10-CM

## 2017-02-17 DIAGNOSIS — E559 Vitamin D deficiency, unspecified: Secondary | ICD-10-CM

## 2017-02-17 DIAGNOSIS — Z0000 Encounter for general adult medical examination without abnormal findings: Secondary | ICD-10-CM

## 2017-02-17 NOTE — Telephone Encounter (Signed)
-----   Message from Marchia Bond sent at 02/15/2017  2:18 PM EDT ----- Regarding: Cpx labs Thurs 3/29, need orders. Thanks! :-) Please order  future cpx labs for pt's upcoming lab appt. Thanks Aniceto Boss

## 2017-02-21 ENCOUNTER — Ambulatory Visit (INDEPENDENT_AMBULATORY_CARE_PROVIDER_SITE_OTHER): Payer: Medicare Other

## 2017-02-21 ENCOUNTER — Other Ambulatory Visit (INDEPENDENT_AMBULATORY_CARE_PROVIDER_SITE_OTHER): Payer: Medicare Other

## 2017-02-21 VITALS — BP 122/72 | HR 75 | Temp 98.7°F | Ht 66.5 in | Wt 154.8 lb

## 2017-02-21 DIAGNOSIS — Z0000 Encounter for general adult medical examination without abnormal findings: Secondary | ICD-10-CM

## 2017-02-21 DIAGNOSIS — E538 Deficiency of other specified B group vitamins: Secondary | ICD-10-CM

## 2017-02-21 DIAGNOSIS — E559 Vitamin D deficiency, unspecified: Secondary | ICD-10-CM | POA: Diagnosis not present

## 2017-02-21 LAB — CBC WITH DIFFERENTIAL/PLATELET
BASOS ABS: 0 10*3/uL (ref 0.0–0.1)
Basophils Relative: 0.7 % (ref 0.0–3.0)
EOS ABS: 0.5 10*3/uL (ref 0.0–0.7)
Eosinophils Relative: 8.5 % — ABNORMAL HIGH (ref 0.0–5.0)
HEMATOCRIT: 40.5 % (ref 36.0–46.0)
HEMOGLOBIN: 13.5 g/dL (ref 12.0–15.0)
LYMPHS PCT: 40.8 % (ref 12.0–46.0)
Lymphs Abs: 2.6 10*3/uL (ref 0.7–4.0)
MCHC: 33.2 g/dL (ref 30.0–36.0)
MCV: 92.6 fl (ref 78.0–100.0)
MONOS PCT: 8.3 % (ref 3.0–12.0)
Monocytes Absolute: 0.5 10*3/uL (ref 0.1–1.0)
Neutro Abs: 2.6 10*3/uL (ref 1.4–7.7)
Neutrophils Relative %: 41.7 % — ABNORMAL LOW (ref 43.0–77.0)
Platelets: 189 10*3/uL (ref 150.0–400.0)
RBC: 4.37 Mil/uL (ref 3.87–5.11)
RDW: 12.9 % (ref 11.5–15.5)
WBC: 6.3 10*3/uL (ref 4.0–10.5)

## 2017-02-21 LAB — COMPREHENSIVE METABOLIC PANEL
ALBUMIN: 3.9 g/dL (ref 3.5–5.2)
ALK PHOS: 88 U/L (ref 39–117)
ALT: 12 U/L (ref 0–35)
AST: 16 U/L (ref 0–37)
BUN: 14 mg/dL (ref 6–23)
CO2: 33 mEq/L — ABNORMAL HIGH (ref 19–32)
CREATININE: 0.74 mg/dL (ref 0.40–1.20)
Calcium: 9.2 mg/dL (ref 8.4–10.5)
Chloride: 105 mEq/L (ref 96–112)
GFR: 79.84 mL/min (ref >60.00)
Glucose, Bld: 82 mg/dL (ref 70–99)
Potassium: 4 mEq/L (ref 3.5–5.1)
Sodium: 143 mEq/L (ref 135–145)
TOTAL PROTEIN: 6.9 g/dL (ref 6.0–8.3)
Total Bilirubin: 0.4 mg/dL (ref 0.2–1.2)

## 2017-02-21 LAB — LIPID PANEL
CHOL/HDL RATIO: 3
CHOLESTEROL: 184 mg/dL (ref 0–200)
HDL: 53.5 mg/dL (ref >39.00)
LDL Cholesterol: 106 mg/dL — ABNORMAL HIGH (ref 0–99)
NonHDL: 130.93
TRIGLYCERIDES: 125 mg/dL (ref 0.0–149.0)
VLDL: 25 mg/dL (ref 0.0–40.0)

## 2017-02-21 LAB — VITAMIN D 25 HYDROXY (VIT D DEFICIENCY, FRACTURES): VITD: 41.58 ng/mL (ref 30.00–100.00)

## 2017-02-21 LAB — VITAMIN B12: VITAMIN B 12: 1277 pg/mL — AB (ref 211–911)

## 2017-02-21 LAB — TSH: TSH: 0.52 u[IU]/mL (ref 0.35–4.50)

## 2017-02-21 NOTE — Progress Notes (Signed)
Pre visit review using our clinic review tool, if applicable. No additional management support is needed unless otherwise documented below in the visit note. 

## 2017-02-21 NOTE — Patient Instructions (Addendum)
Carla Kelly , Thank you for taking time to come for your Medicare Wellness Visit. I appreciate your ongoing commitment to your health goals. Please review the following plan we discussed and let me know if I can assist you in the future.   These are the goals we discussed: Goals    . Increase physical activity          Starting 02/21/17, I will continue to exercise at least 30 min 2-3 days per week.        This is a list of the screening recommended for you and due dates:  Health Maintenance  Topic Date Due  . Mammogram  04/27/2017  . Tetanus Vaccine  12/12/2022  . Flu Shot  Completed  . DEXA scan (bone density measurement)  Completed  . Pneumonia vaccines  Completed   Preventive Care for Adults  A healthy lifestyle and preventive care can promote health and wellness. Preventive health guidelines for adults include the following key practices.  . A routine yearly physical is a good way to check with your health care provider about your health and preventive screening. It is a chance to share any concerns and updates on your health and to receive a thorough exam.  . Visit your dentist for a routine exam and preventive care every 6 months. Brush your teeth twice a day and floss once a day. Good oral hygiene prevents tooth decay and gum disease.  . The frequency of eye exams is based on your age, health, family medical history, use  of contact lenses, and other factors. Follow your health care provider's ecommendations for frequency of eye exams.  . Eat a healthy diet. Foods like vegetables, fruits, whole grains, low-fat dairy products, and lean protein foods contain the nutrients you need without too many calories. Decrease your intake of foods high in solid fats, added sugars, and salt. Eat the right amount of calories for you. Get information about a proper diet from your health care provider, if necessary.  . Regular physical exercise is one of the most important things you can do  for your health. Most adults should get at least 150 minutes of moderate-intensity exercise (any activity that increases your heart rate and causes you to sweat) each week. In addition, most adults need muscle-strengthening exercises on 2 or more days a week.  Silver Sneakers may be a benefit available to you. To determine eligibility, you may visit the website: www.silversneakers.com or contact program at 970 221 4380 Mon-Fri between 8AM-8PM.   . Maintain a healthy weight. The body mass index (BMI) is a screening tool to identify possible weight problems. It provides an estimate of body fat based on height and weight. Your health care provider can find your BMI and can help you achieve or maintain a healthy weight.   For adults 20 years and older: ? A BMI below 18.5 is considered underweight. ? A BMI of 18.5 to 24.9 is normal. ? A BMI of 25 to 29.9 is considered overweight. ? A BMI of 30 and above is considered obese.   . Maintain normal blood lipids and cholesterol levels by exercising and minimizing your intake of saturated fat. Eat a balanced diet with plenty of fruit and vegetables. Blood tests for lipids and cholesterol should begin at age 62 and be repeated every 5 years. If your lipid or cholesterol levels are high, you are over 50, or you are at high risk for heart disease, you may need your cholesterol levels checked more  frequently. Ongoing high lipid and cholesterol levels should be treated with medicines if diet and exercise are not working.  . If you smoke, find out from your health care provider how to quit. If you do not use tobacco, please do not start.  . If you choose to drink alcohol, please do not consume more than 2 drinks per day. One drink is considered to be 12 ounces (355 mL) of beer, 5 ounces (148 mL) of wine, or 1.5 ounces (44 mL) of liquor.  . If you are 71-45 years old, ask your health care provider if you should take aspirin to prevent strokes.  . Use sunscreen.  Apply sunscreen liberally and repeatedly throughout the day. You should seek shade when your shadow is shorter than you. Protect yourself by wearing long sleeves, pants, a wide-brimmed hat, and sunglasses year round, whenever you are outdoors.  . Once a month, do a whole body skin exam, using a mirror to look at the skin on your back. Tell your health care provider of new moles, moles that have irregular borders, moles that are larger than a pencil eraser, or moles that have changed in shape or color.

## 2017-02-21 NOTE — Progress Notes (Signed)
PCP notes:   Health maintenance:  No gaps identified.  Abnormal screenings:   Hearing - failed Depression score: 5  Patient concerns:   None  Nurse concerns:  None  Next PCP appt:   03/04/17 @ 1430

## 2017-02-21 NOTE — Progress Notes (Signed)
Subjective:   Carla Kelly is a 83 y.o. female who presents for Medicare Annual (Subsequent) preventive examination.  Review of Systems:  N/A Cardiac Risk Factors include: advanced age (>72men, >55 women);dyslipidemia     Objective:     Vitals: BP 122/72 (BP Location: Right Arm, Patient Position: Sitting, Cuff Size: Normal)   Pulse 75   Temp 98.7 F (37.1 C) (Oral)   Ht 5' 6.5" (1.689 m) Comment: no shoes  Wt 154 lb 12 oz (70.2 kg)   SpO2 99%   BMI 24.60 kg/m   Body mass index is 24.6 kg/m.   Tobacco History  Smoking Status  . Never Smoker  Smokeless Tobacco  . Never Used     Counseling given: No   Past Medical History:  Diagnosis Date  . Adenomatous colon polyp   . Asthma   . Depression   . Disorder of bone and cartilage, unspecified   . Embolism and thrombosis of unspecified site   . Fibromyalgia   . History of herpes zoster   . HLD (hyperlipidemia)   . Hypoglycemia   . Hypothyroidism   . IBS (irritable bowel syndrome)   . Mitral valve disorder    "leaking mitral valve-very mild"  . Orbital floor fracture ([place]) 10/16   with a fall   . Osteoarthritis   . Osteoarthrosis, unspecified whether generalized or localized, unspecified site   . Other B-complex deficiencies   . Psoriasis   . Stroke ([place])   . Swelling of extremity, right    right leg  . 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 Unspecified urinary incontinence   . Unspecified vitamin D deficiency    Past Surgical History:  Procedure Laterality Date  . ABDOMINAL HYSTERECTOMY  1970   fibroids; endometriosis  . APPENDECTOMY    . bladder tack  5/05  . CATARACT EXTRACTION Right 12/13   Dr George Ina  . CATARACT EXTRACTION W/PHACO Left 05/10/2015   Procedure: CATARACT EXTRACTION PHACO AND INTRAOCULAR LENS PLACEMENT (IOC);  Surgeon: Birder Robson, MD;  Location: [facility] ORS;  Service: Ophthalmology;  Laterality: Left;  [place] 00:59 AP% 26.7 CDE 15.85 fluid pack lot#[id number]  . CERVICAL DISC  SURGERY     x 2  . CHOLECYSTECTOMY  2004  . COLONOSCOPY  2/02   polyps, BE-polyps  . EYE SURGERY     right eye  . MOUTH SURGERY     lower teeth have 4 studs to keep in place  . TONSILLECTOMY    . TOTAL KNEE ARTHROPLASTY Right 08/13/2014   Procedure: RIGHT TOTAL KNEE ARTHROPLASTY;  Surgeon: Mcarthur Rossetti, MD;  Location: [facility];  Service: Orthopedics;  Laterality: Right;  . TYMPANOPLASTY    . US TRANSVAGINAL PELVIC MODIFIED  10/02   small left ovarian cyst   Family History  Problem Relation Age of Onset  . Heart attack Mother   . Stroke Mother     several  . Lupus Brother   . Other Sister     benign brain tumor  . Asthma Daughter   . Breast cancer Daughter   . Other Brother     heart problems  . Colon cancer      1st cousin   History  Sexual Activity  . Sexual activity: No    Outpatient Encounter Prescriptions as of 02/21/2017  Medication Sig  . albuterol (PROVENTIL) 4 MG tablet TAKE 1 TABLET BY MOUTH EVERY 4 HOURS AS NEEDED.  Marland Kitchen aspirin 325 MG tablet Take 325 mg by mouth  daily.  . Biotin 2500 MCG CAPS Take 2,500 mcg by mouth daily.  . Calcium 1500 MG tablet Take 1,500 mg by mouth every morning.   . Calcium-Phosphorus-Vitamin D (CALCIUM/D3 ADULT GUMMIES PO) Take 500 mg by mouth daily.  . cholecalciferol (VITAMIN D) 1000 units tablet Take 2,000 Units by mouth daily.  . diazepam (VALIUM) 5 MG tablet take 1 tablet by mouth three times a day if needed  . furosemide (LASIX) 40 MG tablet TAKE 1 TABLET BY MOUTH EVERY DAY (Patient taking differently: TAKE 1 TABLET BY MOUTH EVERY DAY AS NEEDED)  . HYDROcodone-acetaminophen (NORCO/VICODIN) 5-325 MG tablet Take 1 tablet by mouth 2 (two) times daily as needed. for pain  . Multiple Vitamin (MULTIVITAMIN) tablet Take 1 tablet by mouth every morning.   Marland Kitchen SYNTHROID 88 MCG tablet take 1 tablet by mouth every morning ON AN EMPTY STOMACH  . vitamin B-12 (CYANOCOBALAMIN) 500 MCG tablet Take 500 mcg by mouth daily.  Marland Kitchen PROAIR HFA 108  (90 BASE) MCG/ACT inhaler INHALE 2 PUFFS EVERY 4 HOURS AS NEEDED (Patient not taking: Reported on 02/13/2017)  . [DISCONTINUED] sulfamethoxazole-trimethoprim (BACTRIM DS,SEPTRA DS) 800-160 MG tablet Take 1 tablet by mouth 2 (two) times daily. (Patient not taking: Reported on 11/01/2016)   No facility-administered encounter medications on file as of 02/21/2017.     Activities of Daily Living In your present state of health, do you have any difficulty performing the following activities: 02/21/2017  Hearing? N  Vision? N  Difficulty concentrating or making decisions? N  Walking or climbing stairs? N  Dressing or bathing? N  Doing errands, shopping? N  Preparing Food and eating ? N  Using the Toilet? N  In the past six months, have you accidently leaked urine? Y  Do you have problems with loss of bowel control? N  Managing your Medications? N  Managing your Finances? N  Housekeeping or managing your Housekeeping? N  Some recent data might be hidden    Patient Care Team: Abner Greenspan, MD as PCP - General Birder Robson, MD as Referring Physician (Ophthalmology)    Assessment:     Hearing Screening   125Hz  250Hz  500Hz  1000Hz  2000Hz  3000Hz  4000Hz  6000Hz  8000Hz   Right ear:   40 40 40  0    Left ear:   40 40 0  0    Vision Screening Comments: Last vision exam in March 2017   Exercise Activities and Dietary recommendations Current Exercise Habits: Home exercise routine, Type of exercise: walking;Other - see comments (stationary bike), Time (Minutes): 30, Frequency (Times/Week): 3, Weekly Exercise (Minutes/Week): 90, Intensity: Mild  Goals    . Increase physical activity          Starting 02/21/17, I will continue to exercise at least 30 min 2-3 days per week.       Fall Risk Fall Risk  02/21/2017 02/21/2016 02/21/2016 02/21/2016 02/15/2015  Falls in the past year? No (No Data) Yes Yes No  Number falls in past yr: - - 1 1 -  Injury with Fall? - - Yes Yes -  Follow up - - Falls  evaluation completed Falls evaluation completed -   Depression Screen PHQ 2/9 Scores 02/21/2017 02/21/2016 02/21/2016 02/21/2016  PHQ - 2 Score 1 0 0 0  PHQ- 9 Score 5 - - -     Cognitive Function MMSE - Mini Mental State Exam 02/21/2017 02/21/2016  Orientation to time 5 5  Orientation to Place 5 5  Registration 3 3  Attention/  Calculation 0 0  Recall 3 3  Language- name 2 objects 0 0  Language- repeat 1 1  Language- follow 3 step command 3 3  Language- read & follow direction 0 0  Write a sentence 0 0  Copy design 0 0  Total score 20 20       PLEASE NOTE: A Mini-Cog screen was completed. Maximum score is 20. A value of 0 denotes this part of [place]stein MMSE was not completed or the patient failed this part of the Mini-Cog screening.   Mini-Cog Screening Orientation to Time - Max 5 pts Orientation to Place - Max 5 pts Registration - Max 3 pts Recall - Max 3 pts Language Repeat - Max 1 pts Language Follow 3 Step Command - Max 3 pts c  Immunization History  Administered Date(s) Administered  . Influenza Split 08/30/2011, 08/19/2012  . Influenza Whole 09/01/2004, 10/10/2007, 08/25/2008, 08/29/2009, 08/22/2010  . Influenza,inj,Quad PF,36+ Mos 09/01/2013, 08/17/2015, 08/17/2016  . Pneumococcal Conjugate-13 02/15/2015  . Pneumococcal Polysaccharide-23 08/25/2008  . Td 03/03/2003, 12/12/2012   Screening Tests Health Maintenance  Topic Date Due  . MAMMOGRAM  04/27/2017  . TETANUS/TDAP  12/12/2022  . INFLUENZA VACCINE  Completed  . DEXA SCAN  Completed  . PNA vac Low Risk Adult  Completed      Plan:     I have personally reviewed and addressed the Medicare Annual Wellness questionnaire and have noted the following in the patient's chart:  A. Medical and social history B. Use of alcohol, tobacco or illicit drugs  C. Current medications and supplements D. Functional ability and status E.  Nutritional status F.  Physical activity G. Advance directives H. List of other  physicians I.  Hospitalizations, surgeries, and ER visits in previous 12 months J.  [place] to include hearing, vision, cognitive, depression L. Referrals and appointments - none  In addition, I have reviewed and discussed with patient certain preventive protocols, quality metrics, and best practice recommendations. A written personalized care plan for preventive services as well as general preventive health recommendations were provided to patient.  See attached scanned questionnaire for additional information.   Signed,   Lindell Noe, MHA, BS, LPN Health Coach

## 2017-02-21 NOTE — Progress Notes (Signed)
I reviewed health advisor's note, was available for consultation, and agree with documentation and plan.  

## 2017-02-22 ENCOUNTER — Other Ambulatory Visit: Payer: Medicare Other

## 2017-02-27 ENCOUNTER — Ambulatory Visit: Payer: Medicare Other | Admitting: Family Medicine

## 2017-02-27 ENCOUNTER — Ambulatory Visit (INDEPENDENT_AMBULATORY_CARE_PROVIDER_SITE_OTHER): Payer: Medicare Other | Admitting: Physician Assistant

## 2017-02-27 DIAGNOSIS — M25561 Pain in right knee: Secondary | ICD-10-CM

## 2017-02-27 NOTE — Progress Notes (Signed)
Mrs. Carla Kelly returns today follow-up of her right knee status post cortisone injection. She continues to have pain in the knee despite cortisone injections and working on quad strengthening. She continues to have tenderness over the lateral aspect of the knee. Strip right total knee arthroplasty 08/13/2014. She did well until she had a fall and over the last few months her knee pain is became worse. Did send her for a bone scan which showed no loosening. On exam today with anterior drawer she has a quite a bit of anterior displacement. No instability valgus varus stressing. I told her I for Dr. Ninfa Linden to evaluate her we'll see her next week she may need a possible poly-exchange. No charge for today's office visit

## 2017-03-04 ENCOUNTER — Encounter: Payer: Self-pay | Admitting: Family Medicine

## 2017-03-04 ENCOUNTER — Ambulatory Visit (INDEPENDENT_AMBULATORY_CARE_PROVIDER_SITE_OTHER): Payer: Medicare Other | Admitting: Family Medicine

## 2017-03-04 VITALS — BP 126/64 | HR 90 | Temp 98.5°F | Ht 66.5 in | Wt 154.5 lb

## 2017-03-04 DIAGNOSIS — E039 Hypothyroidism, unspecified: Secondary | ICD-10-CM | POA: Diagnosis not present

## 2017-03-04 DIAGNOSIS — M8589 Other specified disorders of bone density and structure, multiple sites: Secondary | ICD-10-CM

## 2017-03-04 DIAGNOSIS — Z86010 Personal history of colonic polyps: Secondary | ICD-10-CM

## 2017-03-04 DIAGNOSIS — Z1231 Encounter for screening mammogram for malignant neoplasm of breast: Secondary | ICD-10-CM | POA: Insufficient documentation

## 2017-03-04 DIAGNOSIS — E538 Deficiency of other specified B group vitamins: Secondary | ICD-10-CM

## 2017-03-04 DIAGNOSIS — E7800 Pure hypercholesterolemia, unspecified: Secondary | ICD-10-CM | POA: Diagnosis not present

## 2017-03-04 DIAGNOSIS — Z0000 Encounter for general adult medical examination without abnormal findings: Secondary | ICD-10-CM | POA: Diagnosis not present

## 2017-03-04 DIAGNOSIS — E559 Vitamin D deficiency, unspecified: Secondary | ICD-10-CM | POA: Diagnosis not present

## 2017-03-04 DIAGNOSIS — E2839 Other primary ovarian failure: Secondary | ICD-10-CM

## 2017-03-04 MED ORDER — SYNTHROID 88 MCG PO TABS: ORAL_TABLET | ORAL | 3 refills | Status: DC

## 2017-03-04 NOTE — Assessment & Plan Note (Signed)
Hypothyroidism  Pt has no clinical changes No change in energy level/ hair or skin/ edema and no tremor Lab Results  Component Value Date   TSH 0.52 02/21/2017

## 2017-03-04 NOTE — Progress Notes (Signed)
Subjective:    Patient ID: Carla Kelly, female    DOB: 1933-01-18, 83 y.o.   MRN: [id number]  HPI Here for health maintenance exam and to review chronic medical problems    Wt Readings from Last 3 Encounters:  03/04/17 154 lb 8 oz (70.1 kg)  02/21/17 154 lb 12 oz (70.2 kg)  08/29/16 155 lb 4 oz (70.4 kg)   bm 24.5  BP Readings from Last 3 Encounters:  03/04/17 126/64  02/21/17 122/72  08/29/16 128/70   She is having problems with her knee replacement still  Had to have an effusion tapped  She may have injured the joint with her fall last year   Had AMW on 3/29 Missed L ear 2000 Hz tone on her hearing test as well as 4000 Hz both ears  She does not desire hearing aides yet Her L ear has a repaired TM as well  Depression score 5  Mammogram 6/17 neg  Self breast exam -no lumps or changes  Daughter had breast cancer - doing well with double mastectomy   Pap/gyn care 09 pap nl  Hysterectomy in the past  No gyn c/o at all   Zoster vaccine -may be interested in the new inactivated vaccine if it is affordable  Flu vaccine utd Tetanus vaccine utd  Colonoscopy/ screening 7/11 colonoscopy -2 y recall if she wants it   dexa 4/16 osteopenia  She wants to get dexa scheduled  Vit D is 41.5 Takes her ca and D Last fracture (orbital) was over a year ago -not a fragility fracture  No fractures in 2017   Hypothyroidism  Pt has no clinical changes No change in energy level/ hair or skin/ edema and no tremor Lab Results  Component Value Date   TSH 0.52 02/21/2017     Hx of hyperlipidemia Lab Results  Component Value Date   CHOL 184 02/21/2017   CHOL 180 02/13/2016   CHOL 167 02/10/2015   Lab Results  Component Value Date   HDL 53.50 02/21/2017   HDL 52.10 02/13/2016   HDL 65.50 02/10/2015   Lab Results  Component Value Date   LDLCALC 106 (H) 02/21/2017   LDLCALC 111 (H) 02/13/2016   LDLCALC 90 02/10/2015   Lab Results  Component Value Date   TRIG 125.0  02/21/2017   TRIG 85.0 02/13/2016   TRIG 59.0 02/10/2015   Lab Results  Component Value Date   CHOLHDL 3 02/21/2017   CHOLHDL 3 02/13/2016   CHOLHDL 3 02/10/2015   Lab Results  Component Value Date   LDLDIRECT 149.5 12/08/2013   LDLDIRECT 111.0 12/04/2012   LDLDIRECT 128.3 09/17/2011  good profile overall  She usually eats very healthy  (needs new dentures) - unable to eat crunchy foods lately   Hx of B12 def Lab Results  Component Value Date   VITAMINB12 1,277 (H) 02/21/2017  will dec her B12 down   Patient Active Problem List   Diagnosis Date Noted  . Screening mammogram, encounter for 03/04/2017  . RUQ pain 08/29/2016  . Flank pain 08/29/2016  . Routine general medical examination at a health care facility 02/12/2016  . Lump of breast, left 03/07/2015  . Estrogen deficiency 02/15/2015  . Colon cancer screening 02/15/2015  . Arthritis of right knee 08/13/2014  . Status post total right knee replacement 08/13/2014  . Transient global amnesia 02/20/2014  . Encounter for Medicare annual wellness exam 12/07/2013  . Allergic drug rash 03/16/2013  . Psoriasis 02/16/2013  .  Other screening mammogram 08/19/2012  . Pedal edema 03/11/2012  . B12 deficiency 09/05/2009  . Vitamin D deficiency 11/08/2008  . Hyperlipidemia 08/25/2008  . Osteopenia 08/25/2008  . History of colonic polyps 08/25/2008  . HERPES ZOSTER, HX OF 06/03/2008  . Hypothyroidism 08/04/2007  . HYPOGLYCEMIA 08/04/2007  . ASTHMA 08/04/2007  . IRRITABLE BOWEL SYNDROME 08/04/2007  . VAGINITIS, ATROPHIC 08/04/2007  . Osteoarthrosis, unspecified whether generalized or localized, unspecified site 08/04/2007  . FIBROMYALGIA 08/04/2007  . URINARY INCONTINENCE 08/04/2007   Past Medical History:  Diagnosis Date  . Adenomatous colon polyp   . Asthma   . Depression   . Disorder of bone and cartilage, unspecified   . Embolism and thrombosis of unspecified site   . Fibromyalgia   . History of herpes zoster     . HLD (hyperlipidemia)   . Hypoglycemia   . Hypothyroidism   . IBS (irritable bowel syndrome)   . Mitral valve disorder    "leaking mitral valve-very mild"  . Orbital floor fracture ([place]) 10/16   with a fall   . Osteoarthritis   . Osteoarthrosis, unspecified whether generalized or localized, unspecified site   . Other B-complex deficiencies   . Psoriasis   . Stroke ([place])   . Swelling of extremity, right    right leg  . 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 Unspecified urinary incontinence   . Unspecified vitamin D deficiency    Past Surgical History:  Procedure Laterality Date  . ABDOMINAL HYSTERECTOMY  1970   fibroids; endometriosis  . APPENDECTOMY    . bladder tack  5/05  . CATARACT EXTRACTION Right 12/13   Dr George Ina  . CATARACT EXTRACTION W/PHACO Left 05/10/2015   Procedure: CATARACT EXTRACTION PHACO AND INTRAOCULAR LENS PLACEMENT (IOC);  Surgeon: Birder Robson, MD;  Location: [facility] ORS;  Service: Ophthalmology;  Laterality: Left;  [place] 00:59 AP% 26.7 CDE 15.85 fluid pack lot#[id number]  . CERVICAL DISC SURGERY     x 2  . CHOLECYSTECTOMY  2004  . COLONOSCOPY  2/02   polyps, BE-polyps  . EYE SURGERY     right eye  . MOUTH SURGERY     lower teeth have 4 studs to keep in place  . TONSILLECTOMY    . TOTAL KNEE ARTHROPLASTY Right 08/13/2014   Procedure: RIGHT TOTAL KNEE ARTHROPLASTY;  Surgeon: Mcarthur Rossetti, MD;  Location: [facility];  Service: Orthopedics;  Laterality: Right;  . TYMPANOPLASTY    . US TRANSVAGINAL PELVIC MODIFIED  10/02   small left ovarian cyst   Social History  Substance Use Topics  . Smoking status: Never Smoker  . Smokeless tobacco: Never Used  . Alcohol use No     Comment: "is allergic"   Family History  Problem Relation Age of Onset  . Heart attack Mother   . Stroke Mother     several  . Lupus Brother   . Other Sister     benign brain tumor  . Asthma Daughter   . Breast cancer Daughter   . Other Brother     heart problems   . Colon cancer      1st cousin   Allergies  Allergen Reactions  . Alcohol-Sulfur [Sulfur] Shortness Of Breath and Swelling    Alcohol po swells throat  . Ergocalciferol Other (See Comments)    REACTION: GI side effects  . Morphine Nausea And Vomiting  . Tolterodine Tartrate Other (See Comments)    REACTION: ? reaction  . Myrbetriq [Mirabegron] Other (See Comments)  GI Upset  . Nortriptyline Hcl Other (See Comments)    unknown   Current Outpatient Prescriptions on File Prior to Visit  Medication Sig Dispense Refill  . albuterol (PROVENTIL) 4 MG tablet TAKE 1 TABLET BY MOUTH EVERY 4 HOURS AS NEEDED. 120 tablet 1  . aspirin 325 MG tablet Take 325 mg by mouth daily.    . Biotin 2500 MCG CAPS Take 2,500 mcg by mouth daily.    . Calcium 1500 MG tablet Take 1,500 mg by mouth every morning.     . Calcium-Phosphorus-Vitamin D (CALCIUM/D3 ADULT GUMMIES PO) Take 500 mg by mouth daily.    . cholecalciferol (VITAMIN D) 1000 units tablet Take 2,000 Units by mouth daily.    . diazepam (VALIUM) 5 MG tablet take 1 tablet by mouth three times a day if needed 90 tablet 0  . furosemide (LASIX) 40 MG tablet TAKE 1 TABLET BY MOUTH EVERY DAY (Patient taking differently: TAKE 1/2 TABLET BY MOUTH EVERY DAY AS NEEDED) 30 tablet 5  . Multiple Vitamin (MULTIVITAMIN) tablet Take 1 tablet by mouth every morning.      No current facility-administered medications on file prior to visit.      Review of Systems Review of Systems  Constitutional: Negative for fever, appetite change, fatigue and unexpected weight change.  Eyes: Negative for pain and visual disturbance.  Respiratory: Negative for cough and shortness of breath.   Cardiovascular: Negative for cp or palpitations    Gastrointestinal: Negative for nausea, diarrhea and constipation.  Genitourinary: Negative for urgency and frequency.  Skin: Negative for pallor or rash   MSK pos for L knee pain (that had knee replacement) Neurological: Negative  for weakness, light-headedness, numbness and headaches.  Hematological: Negative for adenopathy. Does not bruise/bleed easily.  Psychiatric/Behavioral: Negative for dysphoric mood. The patient is not nervous/anxious.         Objective:   Physical Exam  Constitutional: She appears well-developed and well-nourished. No distress.  Well appearing   HENT:  Head: Normocephalic and atraumatic.  Right Ear: External ear normal.  Left Ear: External ear normal.  Mouth/Throat: Oropharynx is clear and moist.  Eyes: Conjunctivae and EOM are normal. Pupils are equal, round, and reactive to light. No scleral icterus.  Neck: Normal range of motion. Neck supple. No JVD present. Carotid bruit is not present. No thyromegaly present.  Cardiovascular: Normal rate, regular rhythm, normal heart sounds and intact distal pulses.  Exam reveals no gallop.   Pulmonary/Chest: Effort normal and breath sounds normal. No respiratory distress. She has no wheezes. She exhibits no tenderness.  Abdominal: Soft. Bowel sounds are normal. She exhibits no distension, no abdominal bruit and no mass. There is no tenderness.  Genitourinary: No breast swelling, tenderness, discharge or bleeding.  Genitourinary Comments: Breast exam: No mass, nodules, thickening, tenderness, bulging, retraction, inflamation, nipple discharge or skin changes noted.  No axillary or clavicular LA.      Musculoskeletal: Normal range of motion. She exhibits no edema or tenderness.  Some mild swelling of R knee (with scar)  Very mild kyphosis   Lymphadenopathy:    She has no cervical adenopathy.  Neurological: She is alert. She has normal reflexes. No cranial nerve deficit. She exhibits normal muscle tone. Coordination normal.  Skin: Skin is warm and dry. No rash noted. No erythema. No pallor.  Fair with some lentigines and sks scattered  No active psoriasis today  Psychiatric: She has a normal mood and affect.  Assessment & Plan:    Problem List Items Addressed This Visit      Endocrine   Hypothyroidism - Primary    Hypothyroidism  Pt has no clinical changes No change in energy level/ hair or skin/ edema and no tremor Lab Results  Component Value Date   TSH 0.52 02/21/2017           Relevant Medications   SYNTHROID 88 MCG tablet     Musculoskeletal and Integument   Osteopenia    Ref for dexa (2 y check)  Last fracture over a year ago (not fragility)- orbit Also remote hx of to fx in past  She has lost ht D level is in the normal range  Disc need for calcium/ vitamin D/ wt bearing exercise and bone density test every 2 y to monitor Disc safety/ fracture risk in detail          Other   B12 deficiency    Lab Results  Component Value Date   VITAMINB12 1,277 (H) 02/21/2017   This is high She will cut her supplementation in 1/2 Continue to follow       Estrogen deficiency    dexa ref done      Relevant Orders   DG Bone Density   History of colonic polyps    Last colonoscopy was 7/11 Unsure if she will want to do another one  Given info on cologuard to look at for next year      Hyperlipidemia   Routine general medical examination at a health care facility    Reviewed health habits including diet and exercise and skin cancer prevention Reviewed appropriate screening tests for age  Also reviewed health mt list, fam hx and immunization status , as well as social and family history    See HPI AMW reviewed  Pt does not think she needs a hearing aide yet Given info on cologuard for the future if interested  Labs rev  Will cut her B12 intake by 1/2 since it is high        Screening mammogram, encounter for   Relevant Orders   MM DIGITAL SCREENING BILATERAL   Vitamin D deficiency    Vitamin D level is therapeutic with current supplementation Disc importance of this to bone and overall health Level in the 40s

## 2017-03-04 NOTE — Assessment & Plan Note (Signed)
Lab Results  Component Value Date   VITAMINB12 1,277 (H) 02/21/2017   This is high She will cut her supplementation in 1/2 Continue to follow

## 2017-03-04 NOTE — Assessment & Plan Note (Signed)
Last colonoscopy was 7/11 Unsure if she will want to do another one  Given info on cologuard to look at for next year

## 2017-03-04 NOTE — Patient Instructions (Addendum)
If you are interested in a shingles/zoster vaccine - call your insurance to check on coverage,( you should not get it within 1 month of other vaccines) , then call us for a prescription  for it to take to a pharmacy that gives the shot , or make a nurse visit to get it here depending on your coverage (there is a new inactivated vaccine that we will get soon)  We will refer for dexa   Cut your B12 supplement dose in 1/2   Stay active and try to stand up straight

## 2017-03-04 NOTE — Assessment & Plan Note (Signed)
Ref for dexa (2 y check)  Last fracture over a year ago (not fragility)- orbit Also remote hx of to fx in past  She has lost ht D level is in the normal range  Disc need for calcium/ vitamin D/ wt bearing exercise and bone density test every 2 y to monitor Disc safety/ fracture risk in detail

## 2017-03-04 NOTE — Assessment & Plan Note (Signed)
Reviewed health habits including diet and exercise and skin cancer prevention Reviewed appropriate screening tests for age  Also reviewed health mt list, fam hx and immunization status , as well as social and family history    See HPI AMW reviewed  Pt does not think she needs a hearing aide yet Given info on cologuard for the future if interested  Labs rev  Will cut her B12 intake by 1/2 since it is high

## 2017-03-04 NOTE — Assessment & Plan Note (Signed)
Vitamin D level is therapeutic with current supplementation Disc importance of this to bone and overall health Level in the 40s 

## 2017-03-04 NOTE — Progress Notes (Signed)
Pre visit review using our clinic review tool, if applicable. No additional management support is needed unless otherwise documented below in the visit note. 

## 2017-03-04 NOTE — Assessment & Plan Note (Signed)
dexa ref done  

## 2017-03-05 ENCOUNTER — Encounter: Payer: Self-pay | Admitting: Family Medicine

## 2017-03-06 ENCOUNTER — Encounter (INDEPENDENT_AMBULATORY_CARE_PROVIDER_SITE_OTHER): Payer: Self-pay | Admitting: Physician Assistant

## 2017-03-06 ENCOUNTER — Ambulatory Visit (INDEPENDENT_AMBULATORY_CARE_PROVIDER_SITE_OTHER): Payer: Medicare Other | Admitting: Physician Assistant

## 2017-03-06 DIAGNOSIS — G8929 Other chronic pain: Secondary | ICD-10-CM | POA: Diagnosis not present

## 2017-03-06 DIAGNOSIS — M25561 Pain in right knee: Secondary | ICD-10-CM

## 2017-03-06 NOTE — Progress Notes (Signed)
Office Visit Note   Patient: Carla Kelly           Date of Birth: June 20, 1933           MRN: [id number] Visit Date: 03/06/2017              Requested by: Abner Greenspan, MD [facility] [address] [place], [place] [address] PCP: Loura Pardon, MD   Assessment & Plan: Visit Diagnoses:  1. Chronic pain of right knee     Plan: She is given Sherri billing's card and she'll call whenever she wants to set up right knee liner exchange. We'll see her back 2 weeks postop. Surgical procedure discussed with patient at length today by Dr. Ninfa Linden itself. Postoperative protocol reviewed with patient.  Follow-Up Instructions: Return in about 2 weeks (around 03/20/2017) for post op.   Orders:  No orders of the defined types were placed in this encounter.  No orders of the defined types were placed in this encounter.     Procedures: No procedures performed   Clinical Data: No additional findings.   Subjective: Chief Complaint  Patient presents with  . Right Knee - Follow-up    HPI Single returns today follow-up of her right knee. Again she has good and bad days. She has difficulty going down stairs and feels like the knee may give way on her. She states the knee remains swollen. She underwent a right total knee arthroplasty September 2015 did well until fall since then has had pain and swelling knee. She states the knee feels significantly give way at times. She had a bone scan which did not show any evidence of loosening or infection. She also had a normal sedimentation rate and CRP. Aspirate from the knee as she is shown no growth in the past. Review of Systems   Objective: Vital Signs: There were no vitals taken for this visit.  Physical Exam Alert and oriented 3 Ortho Exam Right knee is swelling. No definite effusion. Knee of full extension valgus varus stressing reveals no laxity. She has laxity with the knee bent at approximately 45 with valgus varus stressing. Anterior drawer  slight laxity. Specialty Comments:  No specialty comments available.  Imaging: No results found.   PMFS History: Patient Active Problem List   Diagnosis Date Noted  . Screening mammogram, encounter for 03/04/2017  . RUQ pain 08/29/2016  . Flank pain 08/29/2016  . Routine general medical examination at a health care facility 02/12/2016  . Lump of breast, left 03/07/2015  . Estrogen deficiency 02/15/2015  . Colon cancer screening 02/15/2015  . Arthritis of right knee 08/13/2014  . Status post total right knee replacement 08/13/2014  . Transient global amnesia 02/20/2014  . Encounter for Medicare annual wellness exam 12/07/2013  . Allergic drug rash 03/16/2013  . Psoriasis 02/16/2013  . Other screening mammogram 08/19/2012  . Pedal edema 03/11/2012  . B12 deficiency 09/05/2009  . Vitamin D deficiency 11/08/2008  . Hyperlipidemia 08/25/2008  . Osteopenia 08/25/2008  . History of colonic polyps 08/25/2008  . HERPES ZOSTER, HX OF 06/03/2008  . Hypothyroidism 08/04/2007  . HYPOGLYCEMIA 08/04/2007  . ASTHMA 08/04/2007  . IRRITABLE BOWEL SYNDROME 08/04/2007  . VAGINITIS, ATROPHIC 08/04/2007  . Osteoarthrosis, unspecified whether generalized or localized, unspecified site 08/04/2007  . FIBROMYALGIA 08/04/2007  . URINARY INCONTINENCE 08/04/2007   Past Medical History:  Diagnosis Date  . Adenomatous colon polyp   . Asthma   . Depression   . Disorder of bone and  cartilage, unspecified   . Embolism and thrombosis of unspecified site   . Fibromyalgia   . History of herpes zoster   . HLD (hyperlipidemia)   . Hypoglycemia   . Hypothyroidism   . IBS (irritable bowel syndrome)   . Mitral valve disorder    "leaking mitral valve-very mild"  . Orbital floor fracture ([place]) 10/16   with a fall   . Osteoarthritis   . Osteoarthrosis, unspecified whether generalized or localized, unspecified site   . Other B-complex deficiencies   . Psoriasis   . Stroke ([place])   . Swelling of  extremity, right    right leg  . 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 Unspecified urinary incontinence   . Unspecified vitamin D deficiency     Family History  Problem Relation Age of Onset  . Heart attack Mother   . Stroke Mother     several  . Lupus Brother   . Other Sister     benign brain tumor  . Asthma Daughter   . Breast cancer Daughter   . Other Brother     heart problems  . Colon cancer      1st cousin    Past Surgical History:  Procedure Laterality Date  . ABDOMINAL HYSTERECTOMY  1970   fibroids; endometriosis  . APPENDECTOMY    . bladder tack  5/05  . CATARACT EXTRACTION Right 12/13   Dr George Ina  . CATARACT EXTRACTION W/PHACO Left 05/10/2015   Procedure: CATARACT EXTRACTION PHACO AND INTRAOCULAR LENS PLACEMENT (IOC);  Surgeon: Birder Robson, MD;  Location: [facility] ORS;  Service: Ophthalmology;  Laterality: Left;  [place] 00:59 AP% 26.7 CDE 15.85 fluid pack lot#[id number] H  . CERVICAL DISC SURGERY     x 2  . CHOLECYSTECTOMY  2004  . COLONOSCOPY  2/02   polyps, BE-polyps  . EYE SURGERY     right eye  . MOUTH SURGERY     lower teeth have 4 studs to keep in place  . TONSILLECTOMY    . TOTAL KNEE ARTHROPLASTY Right 08/13/2014   Procedure: RIGHT TOTAL KNEE ARTHROPLASTY;  Surgeon: Mcarthur Rossetti, MD;  Location: [facility];  Service: Orthopedics;  Laterality: Right;  . TYMPANOPLASTY    . US TRANSVAGINAL PELVIC MODIFIED  10/02   small left ovarian cyst   Social History   Occupational History  . Retired    Social History Main Topics  . Smoking status: Never Smoker  . Smokeless tobacco: Never Used  . Alcohol use No     Comment: "is allergic"  . Drug use: No  . Sexual activity: No

## 2017-04-17 DIAGNOSIS — L648 Other androgenic alopecia: Secondary | ICD-10-CM | POA: Diagnosis not present

## 2017-04-17 DIAGNOSIS — L304 Erythema intertrigo: Secondary | ICD-10-CM | POA: Diagnosis not present

## 2017-04-17 DIAGNOSIS — I781 Nevus, non-neoplastic: Secondary | ICD-10-CM | POA: Diagnosis not present

## 2017-04-17 DIAGNOSIS — L821 Other seborrheic keratosis: Secondary | ICD-10-CM | POA: Diagnosis not present

## 2017-04-23 ENCOUNTER — Other Ambulatory Visit: Payer: Self-pay | Admitting: Family Medicine

## 2017-04-23 ENCOUNTER — Encounter: Payer: Self-pay | Admitting: Family Medicine

## 2017-04-23 MED ORDER — ALBUTEROL SULFATE 4 MG PO TABS: 4.0000 mg | ORAL_TABLET | ORAL | 1 refills | Status: DC | PRN

## 2017-04-23 NOTE — Telephone Encounter (Signed)
Px written for call in   

## 2017-04-23 NOTE — Telephone Encounter (Signed)
Phoned in prescription refill to pharmacy

## 2017-04-23 NOTE — Telephone Encounter (Signed)
Requesting:    diazepam Contract   Signed on 02/21/2016 UDS   Low risk--9./2017 Last OV   03/04/2017--future appt on 03/12/2018 Last Refill   #90 no refills on 12/2016  Please Advise

## 2017-04-29 ENCOUNTER — Ambulatory Visit
Admission: RE | Admit: 2017-04-29 | Discharge: 2017-04-29 | Disposition: A | Discharge status: Home / Self Care | Payer: Medicare Other | Source: Ambulatory Visit | Attending: Family Medicine | Admitting: Family Medicine

## 2017-04-29 ENCOUNTER — Encounter: Payer: Self-pay | Admitting: Family Medicine

## 2017-04-29 DIAGNOSIS — Z1231 Encounter for screening mammogram for malignant neoplasm of breast: Secondary | ICD-10-CM

## 2017-04-29 DIAGNOSIS — E2839 Other primary ovarian failure: Secondary | ICD-10-CM

## 2017-04-29 DIAGNOSIS — M810 Age-related osteoporosis without current pathological fracture: Secondary | ICD-10-CM | POA: Diagnosis not present

## 2017-04-29 DIAGNOSIS — Z780 Asymptomatic menopausal state: Secondary | ICD-10-CM | POA: Diagnosis not present

## 2017-05-31 DIAGNOSIS — L648 Other androgenic alopecia: Secondary | ICD-10-CM | POA: Diagnosis not present

## 2017-07-03 DIAGNOSIS — L820 Inflamed seborrheic keratosis: Secondary | ICD-10-CM | POA: Diagnosis not present

## 2017-07-03 DIAGNOSIS — L658 Other specified nonscarring hair loss: Secondary | ICD-10-CM | POA: Diagnosis not present

## 2017-07-15 ENCOUNTER — Encounter: Payer: Self-pay | Admitting: Family Medicine

## 2017-07-15 DIAGNOSIS — E039 Hypothyroidism, unspecified: Secondary | ICD-10-CM

## 2017-07-16 NOTE — Telephone Encounter (Signed)
Ref to Dr Chalmers Cater for hypothyroid at pt req  Will route to pcc

## 2017-07-29 ENCOUNTER — Other Ambulatory Visit: Payer: Self-pay | Admitting: Family Medicine

## 2017-07-30 NOTE — Telephone Encounter (Signed)
Px written for call in   

## 2017-07-30 NOTE — Telephone Encounter (Signed)
CPE scheduled for 03/12/18, last filled on 04/23/17 #90 tabs with 0 refills, please advise

## 2017-07-30 NOTE — Telephone Encounter (Signed)
Rx called in as prescribed 

## 2017-08-02 ENCOUNTER — Ambulatory Visit (INDEPENDENT_AMBULATORY_CARE_PROVIDER_SITE_OTHER): Payer: Medicare Other

## 2017-08-02 DIAGNOSIS — Z23 Encounter for immunization: Secondary | ICD-10-CM | POA: Diagnosis not present

## 2017-08-29 DIAGNOSIS — E039 Hypothyroidism, unspecified: Secondary | ICD-10-CM | POA: Diagnosis not present

## 2017-08-29 DIAGNOSIS — E162 Hypoglycemia, unspecified: Secondary | ICD-10-CM | POA: Diagnosis not present

## 2017-08-29 DIAGNOSIS — R634 Abnormal weight loss: Secondary | ICD-10-CM | POA: Diagnosis not present

## 2017-08-29 DIAGNOSIS — L659 Nonscarring hair loss, unspecified: Secondary | ICD-10-CM | POA: Diagnosis not present

## 2017-11-21 ENCOUNTER — Other Ambulatory Visit: Payer: Self-pay | Admitting: Family Medicine

## 2017-11-21 MED ORDER — DIAZEPAM 5 MG PO TABS: ORAL_TABLET | ORAL | 0 refills | Status: DC

## 2017-11-21 NOTE — Telephone Encounter (Signed)
Pt request refill diazepam to [facility] [address]. Last refilled # 90 on 07/30/17. Last annual 03/04/17.

## 2017-11-21 NOTE — Telephone Encounter (Signed)
Diazepam refill. Last refill on 07/30/17.

## 2017-11-21 NOTE — Telephone Encounter (Signed)
Copied from [place]. Topic: Inquiry >> Nov 21, 2017  2:22 PM Pricilla Handler wrote: Reason for CRM: Patient called requesting a refill of Diazepam (VALIUM) 5 MG tablet. Patient needs medication ASAP. Patient's Preferred Pharmacy is [facility], [place].

## 2017-11-21 NOTE — Telephone Encounter (Signed)
Rx called in as prescribed 

## 2017-11-21 NOTE — Telephone Encounter (Signed)
Px written for call in   

## 2018-01-20 ENCOUNTER — Other Ambulatory Visit: Payer: Self-pay | Admitting: Family Medicine

## 2018-03-05 ENCOUNTER — Ambulatory Visit: Payer: Medicare Other | Admitting: Family Medicine

## 2018-03-05 ENCOUNTER — Telehealth: Payer: Self-pay | Admitting: Family Medicine

## 2018-03-05 DIAGNOSIS — Z0000 Encounter for general adult medical examination without abnormal findings: Secondary | ICD-10-CM

## 2018-03-05 DIAGNOSIS — E7800 Pure hypercholesterolemia, unspecified: Secondary | ICD-10-CM

## 2018-03-05 DIAGNOSIS — E538 Deficiency of other specified B group vitamins: Secondary | ICD-10-CM

## 2018-03-05 DIAGNOSIS — E559 Vitamin D deficiency, unspecified: Secondary | ICD-10-CM

## 2018-03-05 DIAGNOSIS — E039 Hypothyroidism, unspecified: Secondary | ICD-10-CM

## 2018-03-05 DIAGNOSIS — R600 Localized edema: Secondary | ICD-10-CM

## 2018-03-05 NOTE — Telephone Encounter (Signed)
-----   Message from Eustace Pen, LPN sent at 7/68/1157  3:54 PM EDT ----- Regarding: Labs 4/11 Lab orders needed. Thank you.  Insurance:  [employer]

## 2018-03-06 ENCOUNTER — Ambulatory Visit (INDEPENDENT_AMBULATORY_CARE_PROVIDER_SITE_OTHER): Payer: Medicare Other

## 2018-03-06 VITALS — BP 124/90 | HR 87 | Temp 98.4°F | Ht 66.5 in | Wt 156.5 lb

## 2018-03-06 DIAGNOSIS — E559 Vitamin D deficiency, unspecified: Secondary | ICD-10-CM | POA: Diagnosis not present

## 2018-03-06 DIAGNOSIS — E039 Hypothyroidism, unspecified: Secondary | ICD-10-CM

## 2018-03-06 DIAGNOSIS — E538 Deficiency of other specified B group vitamins: Secondary | ICD-10-CM | POA: Diagnosis not present

## 2018-03-06 DIAGNOSIS — Z0000 Encounter for general adult medical examination without abnormal findings: Secondary | ICD-10-CM

## 2018-03-06 DIAGNOSIS — E7800 Pure hypercholesterolemia, unspecified: Secondary | ICD-10-CM | POA: Diagnosis not present

## 2018-03-06 DIAGNOSIS — R600 Localized edema: Secondary | ICD-10-CM

## 2018-03-06 LAB — LIPID PANEL
CHOL/HDL RATIO: 3
Cholesterol: 201 mg/dL — ABNORMAL HIGH (ref 0–200)
HDL: 67.8 mg/dL (ref >39.00)
LDL CALC: 118 mg/dL — AB (ref 0–99)
NonHDL: 133.16
TRIGLYCERIDES: 74 mg/dL (ref 0.0–149.0)
VLDL: 14.8 mg/dL (ref 0.0–40.0)

## 2018-03-06 LAB — COMPREHENSIVE METABOLIC PANEL
ALT: 12 U/L (ref 0–35)
AST: 16 U/L (ref 0–37)
Albumin: 4.1 g/dL (ref 3.5–5.2)
Alkaline Phosphatase: 63 U/L (ref 39–117)
BUN: 15 mg/dL (ref 6–23)
CALCIUM: 9.3 mg/dL (ref 8.4–10.5)
CHLORIDE: 105 meq/L (ref 96–112)
CO2: 31 meq/L (ref 19–32)
Creatinine, Ser: 0.76 mg/dL (ref 0.40–1.20)
GFR: 77.22 mL/min (ref >60.00)
Glucose, Bld: 83 mg/dL (ref 70–99)
POTASSIUM: 3.8 meq/L (ref 3.5–5.1)
SODIUM: 141 meq/L (ref 135–145)
Total Bilirubin: 0.5 mg/dL (ref 0.2–1.2)
Total Protein: 6.9 g/dL (ref 6.0–8.3)

## 2018-03-06 LAB — CBC WITH DIFFERENTIAL/PLATELET
BASOS PCT: 0.8 % (ref 0.0–3.0)
Basophils Absolute: 0 10*3/uL (ref 0.0–0.1)
EOS PCT: 5.1 % — AB (ref 0.0–5.0)
Eosinophils Absolute: 0.2 10*3/uL (ref 0.0–0.7)
HEMATOCRIT: 40.9 % (ref 36.0–46.0)
HEMOGLOBIN: 13.6 g/dL (ref 12.0–15.0)
LYMPHS PCT: 43.8 % (ref 12.0–46.0)
Lymphs Abs: 1.8 10*3/uL (ref 0.7–4.0)
MCHC: 33.3 g/dL (ref 30.0–36.0)
MCV: 94.5 fl (ref 78.0–100.0)
MONOS PCT: 7 % (ref 3.0–12.0)
Monocytes Absolute: 0.3 10*3/uL (ref 0.1–1.0)
NEUTROS ABS: 1.8 10*3/uL (ref 1.4–7.7)
Neutrophils Relative %: 43.3 % (ref 43.0–77.0)
PLATELETS: 195 10*3/uL (ref 150.0–400.0)
RBC: 4.32 Mil/uL (ref 3.87–5.11)
RDW: 12.7 % (ref 11.5–15.5)
WBC: 4.2 10*3/uL (ref 4.0–10.5)

## 2018-03-06 LAB — TSH: TSH: 0.86 u[IU]/mL (ref 0.35–4.50)

## 2018-03-06 LAB — VITAMIN D 25 HYDROXY (VIT D DEFICIENCY, FRACTURES): VITD: 67.91 ng/mL (ref 30.00–100.00)

## 2018-03-06 LAB — VITAMIN B12: Vitamin B-12: 1349 pg/mL — ABNORMAL HIGH (ref 211–911)

## 2018-03-06 NOTE — Patient Instructions (Signed)
Carla Kelly , Thank you for taking time to come for your Medicare Wellness Visit. I appreciate your ongoing commitment to your health goals. Please review the following plan we discussed and let me know if I can assist you in the future.   These are the goals we discussed: Goals    . Increase physical activity     Starting 03/06/2018, I will continue to exercise for 45-60 minutes 4 days per week.        This is a list of the screening recommended for you and due dates:  Health Maintenance  Topic Date Due  . Mammogram  04/29/2018  . Flu Shot  06/26/2018  . Tetanus Vaccine  12/12/2022  . DEXA scan (bone density measurement)  Completed  . Pneumonia vaccines  Completed   Preventive Care for Adults  A healthy lifestyle and preventive care can promote health and wellness. Preventive health guidelines for adults include the following key practices.  . A routine yearly physical is a good way to check with your health care provider about your health and preventive screening. It is a chance to share any concerns and updates on your health and to receive a thorough exam.  . Visit your dentist for a routine exam and preventive care every 6 months. Brush your teeth twice a day and floss once a day. Good oral hygiene prevents tooth decay and gum disease.  . The frequency of eye exams is based on your age, health, family medical history, use  of contact lenses, and other factors. Follow your health care provider's recommendations for frequency of eye exams.  . Eat a healthy diet. Foods like vegetables, fruits, whole grains, low-fat dairy products, and lean protein foods contain the nutrients you need without too many calories. Decrease your intake of foods high in solid fats, added sugars, and salt. Eat the right amount of calories for you. Get information about a proper diet from your health care provider, if necessary.  . Regular physical exercise is one of the most important things you can do for  your health. Most adults should get at least 150 minutes of moderate-intensity exercise (any activity that increases your heart rate and causes you to sweat) each week. In addition, most adults need muscle-strengthening exercises on 2 or more days a week.  Silver Sneakers may be a benefit available to you. To determine eligibility, you may visit the website: www.silversneakers.com or contact program at 202 826 2779 Mon-Fri between 8AM-8PM.   . Maintain a healthy weight. The body mass index (BMI) is a screening tool to identify possible weight problems. It provides an estimate of body fat based on height and weight. Your health care provider can find your BMI and can help you achieve or maintain a healthy weight.   For adults 20 years and older: ? A BMI below 18.5 is considered underweight. ? A BMI of 18.5 to 24.9 is normal. ? A BMI of 25 to 29.9 is considered overweight. ? A BMI of 30 and above is considered obese.   . Maintain normal blood lipids and cholesterol levels by exercising and minimizing your intake of saturated fat. Eat a balanced diet with plenty of fruit and vegetables. Blood tests for lipids and cholesterol should begin at age 19 and be repeated every 5 years. If your lipid or cholesterol levels are high, you are over 50, or you are at high risk for heart disease, you may need your cholesterol levels checked more frequently. Ongoing high lipid and cholesterol  levels should be treated with medicines if diet and exercise are not working.  . If you smoke, find out from your health care provider how to quit. If you do not use tobacco, please do not start.  . If you choose to drink alcohol, please do not consume more than 2 drinks per day. One drink is considered to be 12 ounces (355 mL) of beer, 5 ounces (148 mL) of wine, or 1.5 ounces (44 mL) of liquor.  . If you are 49-22 years old, ask your health care provider if you should take aspirin to prevent strokes.  . Use sunscreen.  Apply sunscreen liberally and repeatedly throughout the day. You should seek shade when your shadow is shorter than you. Protect yourself by wearing long sleeves, pants, a wide-brimmed hat, and sunglasses year round, whenever you are outdoors.  . Once a month, do a whole body skin exam, using a mirror to look at the skin on your back. Tell your health care provider of new moles, moles that have irregular borders, moles that are larger than a pencil eraser, or moles that have changed in shape or color.

## 2018-03-06 NOTE — Progress Notes (Signed)
Subjective:   Carla Kelly is a 84 y.o. female who presents for Medicare Annual (Subsequent) preventive examination.  Review of Systems:  N/A Cardiac Risk Factors include: advanced age (>35men, >78 women);dyslipidemia     Objective:     Vitals: BP 124/90 (BP Location: Right Arm, Patient Position: Sitting, Cuff Size: Normal)   Pulse 87   Temp 98.4 F (36.9 C) (Oral)   Ht 5' 6.5" (1.689 m)   Wt 156 lb 8 oz (71 kg)   SpO2 98%   BMI 24.88 kg/m   Body mass index is 24.88 kg/m.  Advanced Directives 02/21/2017 02/21/2016 02/15/2015 08/13/2014 08/13/2014 08/06/2014 02/20/2014  Does Patient Have a Medical Advance Directive? Yes Yes Yes Yes Yes Yes Patient does not have advance directive;Patient would like information  Type of Advance Directive [place];Living will - Living will [place];Living will [place];Living will [place];Living will -  Does patient want to make changes to medical advance directive? - No - Patient declined - No - Patient declined No - Patient declined No - Patient declined -  Copy of [place] in Chart? No - copy requested No - copy requested No - copy requested No - copy requested No - copy requested No - copy requested -  Would patient like information on creating a medical advance directive? - - - - - - Advance directive packet given  Pre-existing out of facility DNR order (yellow form or pink MOST form) - - - - - - No    Tobacco Social History   Tobacco Use  Smoking Status Never Smoker  Smokeless Tobacco Never Used     Counseling given: No   Clinical Intake:  Pre-visit preparation completed: Yes  Pain : No/denies pain Pain Score: 0-No pain     Nutritional Status: BMI of 19-24  Normal Nutritional Risks: None Diabetes: No  How often do you need to have someone help you when you read instructions, pamphlets, or other written materials from your doctor or  pharmacy?: 1 - Never What is the last grade level you completed in school?: 12th grade + 2 yrs college  Interpreter Needed?: No  Comments: pt is a widow and lives alone Information entered by :: LPinson, LPN  Past Medical History:  Diagnosis Date  . Adenomatous colon polyp   . Asthma   . Depression   . Disorder of bone and cartilage, unspecified   . Embolism and thrombosis of unspecified site   . Fibromyalgia   . History of herpes zoster   . HLD (hyperlipidemia)   . Hypoglycemia   . Hypothyroidism   . IBS (irritable bowel syndrome)   . Mitral valve disorder    "leaking mitral valve-very mild"  . Orbital floor fracture ([place]) 10/16   with a fall   . Osteoarthritis   . Osteoarthrosis, unspecified whether generalized or localized, unspecified site   . Other B-complex deficiencies   . Psoriasis   . Stroke ([place])   . Swelling of extremity, right    right leg  . 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 Unspecified urinary incontinence   . Unspecified vitamin D deficiency    Past Surgical History:  Procedure Laterality Date  . ABDOMINAL HYSTERECTOMY  1970   fibroids; endometriosis  . APPENDECTOMY    . bladder tack  5/05  . BREAST BIOPSY Right 1995  . BREAST CYST ASPIRATION Left 03/17/2015  . CATARACT EXTRACTION Right 12/13  Dr George Ina  . CATARACT EXTRACTION W/PHACO Left 05/10/2015   Procedure: CATARACT EXTRACTION PHACO AND INTRAOCULAR LENS PLACEMENT (IOC);  Surgeon: Birder Robson, MD;  Location: [facility] ORS;  Service: Ophthalmology;  Laterality: Left;  [place] 00:59 AP% 26.7 CDE 15.85 fluid pack lot#[id number] H  . CERVICAL DISC SURGERY     x 2  . CHOLECYSTECTOMY  2004  . COLONOSCOPY  2/02   polyps, BE-polyps  . EYE SURGERY     right eye  . MOUTH SURGERY     lower teeth have 4 studs to keep in place  . TONSILLECTOMY    . TOTAL KNEE ARTHROPLASTY Right 08/13/2014   Procedure: RIGHT TOTAL KNEE ARTHROPLASTY;  Surgeon: Mcarthur Rossetti, MD;  Location: [facility];  Service:  Orthopedics;  Laterality: Right;  . TYMPANOPLASTY    . US TRANSVAGINAL PELVIC MODIFIED  10/02   small left ovarian cyst   Family History  Problem Relation Age of Onset  . Heart attack Mother   . Stroke Mother        several  . Lupus Brother   . Other Sister        benign brain tumor  . Asthma Daughter   . Breast cancer Daughter   . Other Brother        heart problems  . Colon cancer Unknown        1st cousin   Social History   Socioeconomic History  . Marital status: Widowed    Spouse name: Not on file  . Number of children: 4  . Years of education: Not on file  . Highest education level: Not on file  Occupational History  . Occupation: Retired  Scientific laboratory technician  . Financial resource strain: Not on file  . Food insecurity:    Worry: Not on file    Inability: Not on file  . Transportation needs:    Medical: Not on file    Non-medical: Not on file  Tobacco Use  . Smoking status: Never Smoker  . Smokeless tobacco: Never Used  Substance and Sexual Activity  . Alcohol use: No    Comment: "is allergic"  . Drug use: No  . Sexual activity: Not Currently  Lifestyle  . Physical activity:    Days per week: Not on file    Minutes per session: Not on file  . Stress: Not on file  Relationships  . Social connections:    Talks on phone: Not on file    Gets together: Not on file    Attends religious service: Not on file    Active member of club or organization: Not on file    Attends meetings of clubs or organizations: Not on file    Relationship status: Not on file  Other Topics Concern  . Not on file  Social History Narrative   Widowed      4 children      Retired      Regular exercise      Grandson lives with her    Outpatient Encounter Medications as of 03/06/2018  Medication Sig  . albuterol (PROVENTIL) 4 MG tablet Take 1 tablet (4 mg total) by mouth every 4 (four) hours as needed.  Marland Kitchen aspirin 325 MG tablet Take 325 mg by mouth daily.  . Biotin 2500 MCG CAPS  Take 2,500 mcg by mouth daily.  . Calcium 1500 MG tablet Take 1,500 mg by mouth every morning.   . Calcium-Phosphorus-Vitamin D (CALCIUM/D3 ADULT GUMMIES PO) Take 500 mg by mouth  daily.  . cholecalciferol (VITAMIN D) 1000 units tablet Take 2,000 Units by mouth daily.  . diazepam (VALIUM) 5 MG tablet take 1 tablet by mouth three times a day if needed  . furosemide (LASIX) 40 MG tablet TAKE 1 TABLET BY MOUTH EVERY DAY (Patient taking differently: TAKE 1/2 TABLET BY MOUTH EVERY DAY AS NEEDED)  . Multiple Vitamin (MULTIVITAMIN) tablet Take 1 tablet by mouth every morning.   Marland Kitchen SYNTHROID 88 MCG tablet TAKE 1 TABLET BY MOUTH EVERY MORNING ON AN EMPTY STOMACH  . UNABLE TO FIND Med Name: Prevegen   No facility-administered encounter medications on file as of 03/06/2018.     Activities of Daily Living In your present state of health, do you have any difficulty performing the following activities: 03/06/2018  Hearing? Y  Comment left ear   Vision? N  Difficulty concentrating or making decisions? Y  Comment issues with short term memory; started taking supplement  Walking or climbing stairs? Y  Comment right knee pain  Dressing or bathing? N  Doing errands, shopping? N  Preparing Food and eating ? N  Using the Toilet? N  In the past six months, have you accidently leaked urine? Y  Do you have problems with loss of bowel control? N  Managing your Medications? N  Managing your Finances? N  Housekeeping or managing your Housekeeping? N  Some recent data might be hidden    Patient Care Team: Tower, Wynelle Fanny, MD as PCP - General Birder Robson, MD as Referring Physician (Ophthalmology)    Assessment:   This is a routine wellness examination for Carla Kelly.   Hearing Screening   125Hz  250Hz  500Hz  1000Hz  2000Hz  3000Hz  4000Hz  6000Hz  8000Hz   Right ear:   40 40 40  0    Left ear:   40 40 40  40      Visual Acuity Screening   Right eye Left eye Both eyes  Without correction:     With correction:  20/30-1 20/40-1 20/30-1     Exercise Activities and Dietary recommendations Current Exercise Habits: Structured exercise class, Type of exercise: stretching;yoga, Time (Minutes): 60, Frequency (Times/Week): 4, Weekly Exercise (Minutes/Week): 240, Intensity: Mild, Exercise limited by: None identified  Goals    . Increase physical activity     Starting 03/06/2018, I will continue to exercise for 45-60 minutes 4 days per week.        Fall Risk Fall Risk  03/06/2018 02/21/2017 02/21/2016 02/21/2016 02/21/2016  Falls in the past year? No No (No Data) Yes Yes  Comment - - 08/2015 fall with injury; right orbital floor fracture with laceration - 09/14/2015 - fall with injury; fractured right orbital floor with laceration  Number falls in past yr: - - - 1 1  Injury with Fall? - - - Yes Yes  Follow up - - - Falls evaluation completed Falls evaluation completed   Depression Screen PHQ 2/9 Scores 03/06/2018 02/21/2017 02/21/2016 02/21/2016  PHQ - 2 Score 1 1 0 0  PHQ- 9 Score 1 5 - -     Cognitive Function MMSE - Mini Mental State Exam 03/06/2018 02/21/2017 02/21/2016  Orientation to time 5 5 5   Orientation to Place 5 5 5   Registration 3 3 3   Attention/ Calculation 0 0 0  Recall 3 3 3   Language- name 2 objects 0 0 0  Language- repeat 1 1 1   Language- follow 3 step command 3 3 3   Language- read & follow direction 0 0 0  Write a  sentence 0 0 0  Copy design 0 0 0  Total score 20 20 20      PLEASE NOTE: A Mini-Cog screen was completed. Maximum score is 20. A value of 0 denotes this part of [place] MMSE was not completed or the patient failed this part of the Mini-Cog screening.   Mini-Cog Screening Orientation to Time - Max 5 pts Orientation to Place - Max 5 pts Registration - Max 3 pts Recall - Max 3 pts Language Repeat - Max 1 pts Language Follow 3 Step Command - Max 3 pts     Immunization History  Administered Date(s) Administered  . Influenza Split 08/30/2011, 08/19/2012  . Influenza  Whole 09/01/2004, 10/10/2007, 08/25/2008, 08/29/2009, 08/22/2010  . Influenza, High Dose Seasonal PF 08/02/2017  . Influenza,inj,Quad PF,6+ Mos 09/01/2013, 08/17/2015, 08/17/2016  . Pneumococcal Conjugate-13 02/15/2015  . Pneumococcal Polysaccharide-23 08/25/2008  . Td 03/03/2003, 12/12/2012    Screening Tests Health Maintenance  Topic Date Due  . MAMMOGRAM  04/29/2018  . INFLUENZA VACCINE  06/26/2018  . TETANUS/TDAP  12/12/2022  . DEXA SCAN  Completed  . PNA vac Low Risk Adult  Completed       Plan:     I have personally reviewed, addressed, and noted the following in the patient's chart:  A. Medical and social history B. Use of alcohol, tobacco or illicit drugs  C. Current medications and supplements D. Functional ability and status E.  Nutritional status F.  Physical activity G. Advance directives H. List of other physicians I.  Hospitalizations, surgeries, and ER visits in previous 12 months J.  [place] to include hearing, vision, cognitive, depression L. Referrals and appointments - none  In addition, I have reviewed and discussed with patient certain preventive protocols, quality metrics, and best practice recommendations. A written personalized care plan for preventive services as well as general preventive health recommendations were provided to patient.  See attached scanned questionnaire for additional information.   Signed,   Lindell Noe, MHA, BS, LPN Health Coach

## 2018-03-06 NOTE — Progress Notes (Signed)
PCP notes:   Health maintenance:  No gaps identified.   Abnormal screenings:   Hearing - failed  Hearing Screening   125Hz  250Hz  500Hz  1000Hz  2000Hz  3000Hz  4000Hz  6000Hz  8000Hz   Right ear:   40 40 40  0    Left ear:   40 40 40  40      Depression score: 1 Depression screen [facility] 2/9 03/06/2018 02/21/2017 02/21/2016 02/21/2016 02/21/2016  Decreased Interest 0 0 0 0 0  Down, Depressed, Hopeless 1 1 0 0 0  PHQ - 2 Score 1 1 0 0 0  Altered sleeping 0 0 - - -  Tired, decreased energy 0 3 - - -  Change in appetite 0 0 - - -  Feeling bad or failure about yourself  0 0 - - -  Trouble concentrating 0 0 - - -  Moving slowly or fidgety/restless 0 0 - - -  Suicidal thoughts 0 1 - - -  PHQ-9 Score 1 5 - - -  Difficult doing work/chores Not difficult at all Somewhat difficult - - -     Patient concerns:   Pt has requested referral to podiatrist.  Nurse concerns:  None  Next PCP appt:   03/12/18 @ 1430  I reviewed health advisor's note, was available for consultation, and agree with documentation and plan. Loura Pardon MD

## 2018-03-12 ENCOUNTER — Ambulatory Visit (INDEPENDENT_AMBULATORY_CARE_PROVIDER_SITE_OTHER): Payer: Medicare Other | Admitting: Family Medicine

## 2018-03-12 ENCOUNTER — Encounter: Payer: Self-pay | Admitting: Family Medicine

## 2018-03-12 VITALS — BP 128/68 | HR 89 | Temp 98.1°F | Ht 66.5 in | Wt 157.0 lb

## 2018-03-12 DIAGNOSIS — M2041 Other hammer toe(s) (acquired), right foot: Secondary | ICD-10-CM | POA: Insufficient documentation

## 2018-03-12 DIAGNOSIS — E039 Hypothyroidism, unspecified: Secondary | ICD-10-CM | POA: Diagnosis not present

## 2018-03-12 DIAGNOSIS — M810 Age-related osteoporosis without current pathological fracture: Secondary | ICD-10-CM

## 2018-03-12 DIAGNOSIS — Z0000 Encounter for general adult medical examination without abnormal findings: Secondary | ICD-10-CM | POA: Diagnosis not present

## 2018-03-12 DIAGNOSIS — E538 Deficiency of other specified B group vitamins: Secondary | ICD-10-CM | POA: Diagnosis not present

## 2018-03-12 DIAGNOSIS — E559 Vitamin D deficiency, unspecified: Secondary | ICD-10-CM

## 2018-03-12 DIAGNOSIS — E7800 Pure hypercholesterolemia, unspecified: Secondary | ICD-10-CM

## 2018-03-12 MED ORDER — FUROSEMIDE 40 MG PO TABS: 40.0000 mg | ORAL_TABLET | Freq: Every day | ORAL | 11 refills | Status: DC

## 2018-03-12 MED ORDER — ALBUTEROL SULFATE 4 MG PO TABS: 4.0000 mg | ORAL_TABLET | ORAL | 11 refills | Status: DC | PRN

## 2018-03-12 MED ORDER — DIAZEPAM 5 MG PO TABS: ORAL_TABLET | ORAL | 0 refills | Status: DC

## 2018-03-12 NOTE — Patient Instructions (Addendum)
Don't forget to schedule your mammogram in June   If you are interested in the new shingles vaccine (Shingrix) - call your local pharmacy to check on coverage and availability  If affordable - get on a wait list     you can take your B12 every other day   Keep up the great exercise

## 2018-03-12 NOTE — Progress Notes (Signed)
Subjective:    Patient ID: Carla Kelly, female    DOB: May 21, 1933, 84 y.o.   MRN: [id number]  HPI Here for health maintenance exam and to review chronic medical problems    Is tired and cold intolerant  Started going to the gym in feb  Taking senior aerobics and yoga-  Getting muscles /gained 3 lb  Silver and fit program   Generally walks with a cane but stronger when she was  Having foot problems - would like to see a podiatrist  R 4th hammer toe with callus  Also hx of injury to L great toe years ago - difficult time flexing it   Has R knee pain (repl in the past)- still having trouble with it (4 years)  She plans on seeing a new orthopedic    Wt Readings from Last 3 Encounters:  03/12/18 157 lb (71.2 kg)  03/06/18 156 lb 8 oz (71 kg)  03/04/17 154 lb 8 oz (70.1 kg)  gained some muscle with exercise  Still rides her exercise bike also  24.96 kg/m   Had amw visit 4/11 Missed 4000 Hz hearing tone R ear only on screen Hearing does not bother her as a rule    Mammogram 6/18-neg Self breast exam -no lumps  Daughter had breast cancer   Pt has had a hysterectomy   dexa 6/18- osteoporosis with no significant changes  Exercising more now  D level 67- taking her vit D  No falls or fractures   Zoster status-has had shingles twice  Has not been immunized  Discussed Shingrix   Blood pressure (improved per pt with exercise) BP Readings from Last 3 Encounters:  03/12/18 128/68  03/06/18 124/90  03/04/17 126/64   Colonoscopy 7/11= 10 year recall / likely aged out    Hypothyroidism  Pt has no clinical changes No change in energy level/ hair or skin/ edema and no tremor Lab Results  Component Value Date   TSH 0.86 03/06/2018      Hyperlipidemia Lab Results  Component Value Date   CHOL 201 (H) 03/06/2018   CHOL 184 02/21/2017   CHOL 180 02/13/2016   Lab Results  Component Value Date   HDL 67.80 03/06/2018   HDL 53.50 02/21/2017   HDL 52.10 02/13/2016     Lab Results  Component Value Date   LDLCALC 118 (H) 03/06/2018   LDLCALC 106 (H) 02/21/2017   LDLCALC 111 (H) 02/13/2016   Lab Results  Component Value Date   TRIG 74.0 03/06/2018   TRIG 125.0 02/21/2017   TRIG 85.0 02/13/2016   Lab Results  Component Value Date   CHOLHDL 3 03/06/2018   CHOLHDL 3 02/21/2017   CHOLHDL 3 02/13/2016   Lab Results  Component Value Date   LDLDIRECT 149.5 12/08/2013   LDLDIRECT 111.0 12/04/2012   LDLDIRECT 128.3 09/17/2011  eating pretty healthy  HDL is up from exercise  Thinks LDL is up due to coffee creamer with palm oil - so she will avoid it    B12 def Lab Results  Component Value Date   VITAMINB12 1,349 (H) 03/06/2018  can cut back her bit B12 sup    Patient Active Problem List   Diagnosis Date Noted  . Hammer toe of right foot 03/12/2018  . Screening mammogram, encounter for 03/04/2017  . RUQ pain 08/29/2016  . Flank pain 08/29/2016  . Routine general medical examination at a health care facility 02/12/2016  . Lump of breast, left 03/07/2015  .  Estrogen deficiency 02/15/2015  . Colon cancer screening 02/15/2015  . Arthritis of right knee 08/13/2014  . Status post total right knee replacement 08/13/2014  . Transient global amnesia 02/20/2014  . Encounter for Medicare annual wellness exam 12/07/2013  . Allergic drug rash 03/16/2013  . Psoriasis 02/16/2013  . Other screening mammogram 08/19/2012  . Pedal edema 03/11/2012  . B12 deficiency 09/05/2009  . Vitamin D deficiency 11/08/2008  . Hyperlipidemia 08/25/2008  . Osteoporosis 08/25/2008  . History of colonic polyps 08/25/2008  . HERPES ZOSTER, HX OF 06/03/2008  . Hypothyroidism 08/04/2007  . HYPOGLYCEMIA 08/04/2007  . ASTHMA 08/04/2007  . IRRITABLE BOWEL SYNDROME 08/04/2007  . VAGINITIS, ATROPHIC 08/04/2007  . Osteoarthrosis, unspecified whether generalized or localized, unspecified site 08/04/2007  . FIBROMYALGIA 08/04/2007  . URINARY INCONTINENCE 08/04/2007    Past Medical History:  Diagnosis Date  . Adenomatous colon polyp   . Asthma   . Depression   . Disorder of bone and cartilage, unspecified   . Embolism and thrombosis of unspecified site   . Fibromyalgia   . History of herpes zoster   . HLD (hyperlipidemia)   . Hypoglycemia   . Hypothyroidism   . IBS (irritable bowel syndrome)   . Mitral valve disorder    "leaking mitral valve-very mild"  . Orbital floor fracture ([place]) 10/16   with a fall   . Osteoarthritis   . Osteoarthrosis, unspecified whether generalized or localized, unspecified site   . Other B-complex deficiencies   . Psoriasis   . Stroke ([place])   . Swelling of extremity, right    right leg  . 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 Unspecified urinary incontinence   . Unspecified vitamin D deficiency    Past Surgical History:  Procedure Laterality Date  . ABDOMINAL HYSTERECTOMY  1970   fibroids; endometriosis  . APPENDECTOMY    . bladder tack  5/05  . BREAST BIOPSY Right 1995  . BREAST CYST ASPIRATION Left 03/17/2015  . CATARACT EXTRACTION Right 12/13   Dr George Ina  . CATARACT EXTRACTION W/PHACO Left 05/10/2015   Procedure: CATARACT EXTRACTION PHACO AND INTRAOCULAR LENS PLACEMENT (IOC);  Surgeon: Birder Robson, MD;  Location: [facility];  Service: Ophthalmology;  Laterality: Left;  [place] 00:59 AP% 26.7 CDE 15.85 fluid pack lot#[id number]  . CERVICAL DISC SURGERY     x 2  . CHOLECYSTECTOMY  2004  . COLONOSCOPY  2/02   polyps, BE-polyps  . EYE SURGERY     right eye  . MOUTH SURGERY     lower teeth have 4 studs to keep in place  . TONSILLECTOMY    . TOTAL KNEE ARTHROPLASTY Right 08/13/2014   Procedure: RIGHT TOTAL KNEE ARTHROPLASTY;  Surgeon: Mcarthur Rossetti, MD;  Location: [facility];  Service: Orthopedics;  Laterality: Right;  . TYMPANOPLASTY    . US TRANSVAGINAL PELVIC MODIFIED  10/02   small left ovarian cyst   Social History   Tobacco Use  . Smoking status: Never Smoker  . Smokeless tobacco:  Never Used  Substance Use Topics  . Alcohol use: No    Comment: "is allergic"  . Drug use: No   Family History  Problem Relation Age of Onset  . Heart attack Mother   . Stroke Mother        several  . Lupus Brother   . Other Sister        benign brain tumor  . Asthma Daughter   . Breast cancer Daughter   . Other Brother  heart problems  . Colon cancer Unknown        1st cousin   Allergies  Allergen Reactions  . Alcohol-Sulfur [Sulfur] Shortness Of Breath and Swelling    Alcohol po swells throat  . Ergocalciferol Other (See Comments)    REACTION: GI side effects  . Morphine Nausea And Vomiting  . Tolterodine Tartrate Other (See Comments)    REACTION: ? reaction  . Alcohol Other (See Comments) and Swelling  . Myrbetriq [Mirabegron] Other (See Comments)    GI Upset  . Nortriptyline Hcl Other (See Comments)    unknown   Current Outpatient Medications on File Prior to Visit  Medication Sig Dispense Refill  . albuterol (PROVENTIL) 4 MG tablet Take 1 tablet (4 mg total) by mouth every 4 (four) hours as needed. 30 tablet 1  . aspirin 325 MG tablet Take 325 mg by mouth daily.    . Biotin 2500 MCG CAPS Take 2,500 mcg by mouth daily.    . Calcium 1500 MG tablet Take 1,500 mg by mouth at bedtime.     . Calcium-Phosphorus-Vitamin D (CALCIUM/D3 ADULT GUMMIES PO) Take 500 mg by mouth daily.    . cholecalciferol (VITAMIN D) 1000 units tablet Take 2,000 Units by mouth daily.    . diazepam (VALIUM) 5 MG tablet take 1 tablet by mouth three times a day if needed 90 tablet 0  . furosemide (LASIX) 40 MG tablet TAKE 1 TABLET BY MOUTH EVERY DAY (Patient taking differently: TAKE 1/2 TABLET BY MOUTH EVERY DAY AS NEEDED) 30 tablet 5  . Multiple Vitamin (MULTIVITAMIN) tablet Take 1 tablet by mouth every morning.     . Prenatal Vit-Fe Fumarate-FA (PRENATAL PO) Take 1 tablet by mouth daily.    Marland Kitchen SYNTHROID 88 MCG tablet TAKE 1 TABLET BY MOUTH EVERY MORNING ON AN EMPTY STOMACH 90 tablet 0  .  UNABLE TO FIND Med Name: Prevagen     No current facility-administered medications on file prior to visit.     Review of Systems  Constitutional: Negative for activity change, appetite change, fatigue, fever and unexpected weight change.  HENT: Negative for congestion, ear pain, rhinorrhea, sinus pressure and sore throat.   Eyes: Negative for pain, redness and visual disturbance.  Respiratory: Negative for cough, shortness of breath and wheezing.   Cardiovascular: Negative for chest pain and palpitations.  Gastrointestinal: Negative for abdominal pain, blood in stool, constipation and diarrhea.  Endocrine: Negative for polydipsia and polyuria.  Genitourinary: Negative for dysuria, frequency and urgency.  Musculoskeletal: Positive for arthralgias. Negative for back pain and myalgias.       Foot pain   Skin: Negative for pallor and rash.  Allergic/Immunologic: Negative for environmental allergies.  Neurological: Negative for dizziness, syncope and headaches.  Hematological: Negative for adenopathy. Does not bruise/bleed easily.  Psychiatric/Behavioral: Negative for decreased concentration and dysphoric mood. The patient is not nervous/anxious.        Objective:   Physical Exam  Constitutional: She appears well-developed and well-nourished. No distress.  Well appearing   HENT:  Head: Normocephalic and atraumatic.  Right Ear: External ear normal.  Left Ear: External ear normal.  Mouth/Throat: Oropharynx is clear and moist.  Eyes: Pupils are equal, round, and reactive to light. Conjunctivae and EOM are normal. No scleral icterus.  Neck: Normal range of motion. Neck supple. No JVD present. Carotid bruit is not present. No thyromegaly present.  Cardiovascular: Normal rate, regular rhythm, normal heart sounds and intact distal pulses. Exam reveals no gallop.  Pulmonary/Chest:  Effort normal and breath sounds normal. No respiratory distress. She has no wheezes. She exhibits no tenderness. No  breast swelling, tenderness, discharge or bleeding.  Abdominal: Soft. Bowel sounds are normal. She exhibits no distension, no abdominal bruit and no mass. There is no tenderness.  Genitourinary: No breast swelling, tenderness, discharge or bleeding.  Genitourinary Comments: Breast exam: No mass, nodules, thickening, tenderness, bulging, retraction, inflamation, nipple discharge or skin changes noted.  No axillary or clavicular LA.      Musculoskeletal: Normal range of motion. She exhibits no edema or tenderness.  Limited rom of knees  R 4th hammer toe with callus (no ulceration)  L great toe- limited rom due to pain/no swelling   Good posture  Lymphadenopathy:    She has no cervical adenopathy.  Neurological: She is alert. She has normal reflexes. No cranial nerve deficit. She exhibits normal muscle tone. Coordination normal.  Skin: Skin is warm and dry. No rash noted. No erythema. No pallor.  Some SKs and lentigines   Psychiatric: She has a normal mood and affect.  Pleasant/ good mood          Assessment & Plan:   Problem List Items Addressed This Visit      Endocrine   Hypothyroidism    Hypothyroidism  Pt has no clinical changes No change in energy level/ hair or skin/ edema and no tremor Lab Results  Component Value Date   TSH 0.86 03/06/2018            Musculoskeletal and Integument   Hammer toe of right foot    With callus superiorly  Ref to podiatry for this and L great toe pain      Relevant Orders   Ambulatory referral to Podiatry   Osteoporosis    Stable dexa 6/28 Walking/exercising now  D level is tx  No falls or fx Wants to avoid px med        Other   B12 deficiency    Level is supratx  Lab Results  Component Value Date   VITAMINB12 1,349 (H) 03/06/2018   She will cut dosing to every other day      Hyperlipidemia    Disc goals for lipids and reasons to control them Rev labs with pt Rev low sat fat diet in detail LDL up slt-disc diet         Relevant Medications   furosemide (LASIX) 40 MG tablet   Routine general medical examination at a health care facility - Primary    Reviewed health habits including diet and exercise and skin cancer prevention Reviewed appropriate screening tests for age  Also reviewed health mt list, fam hx and immunization status , as well as social and family history   See HPI amw rev She will schedule mammo for June  Disc Shingrix- is interested       Vitamin D deficiency    Vitamin D level is therapeutic with current supplementation Disc importance of this to bone and overall health

## 2018-03-13 NOTE — Assessment & Plan Note (Signed)
Hypothyroidism  Pt has no clinical changes No change in energy level/ hair or skin/ edema and no tremor Lab Results  Component Value Date   TSH 0.86 03/06/2018

## 2018-03-13 NOTE — Assessment & Plan Note (Signed)
With callus superiorly  Ref to podiatry for this and L great toe pain

## 2018-03-13 NOTE — Assessment & Plan Note (Signed)
Reviewed health habits including diet and exercise and skin cancer prevention Reviewed appropriate screening tests for age  Also reviewed health mt list, fam hx and immunization status , as well as social and family history   See HPI amw rev She will schedule mammo for June  Disc Shingrix- is interested

## 2018-03-13 NOTE — Assessment & Plan Note (Signed)
Level is supratx  Lab Results  Component Value Date   [id number] 3,664 (H) 03/06/2018   She will cut dosing to every other day

## 2018-03-13 NOTE — Assessment & Plan Note (Signed)
Vitamin D level is therapeutic with current supplementation Disc importance of this to bone and overall health  

## 2018-03-13 NOTE — Assessment & Plan Note (Signed)
Stable dexa 6/28 Walking/exercising now  D level is tx  No falls or fx Wants to avoid px med

## 2018-03-13 NOTE — Assessment & Plan Note (Signed)
Disc goals for lipids and reasons to control them Rev labs with pt Rev low sat fat diet in detail LDL up slt-disc diet

## 2018-03-31 ENCOUNTER — Ambulatory Visit (INDEPENDENT_AMBULATORY_CARE_PROVIDER_SITE_OTHER): Payer: Medicare Other

## 2018-03-31 ENCOUNTER — Encounter: Payer: Self-pay | Admitting: Podiatry

## 2018-03-31 ENCOUNTER — Ambulatory Visit: Payer: Medicare Other | Admitting: Podiatry

## 2018-03-31 DIAGNOSIS — M2041 Other hammer toe(s) (acquired), right foot: Secondary | ICD-10-CM

## 2018-03-31 DIAGNOSIS — Q828 Other specified congenital malformations of skin: Secondary | ICD-10-CM | POA: Diagnosis not present

## 2018-03-31 DIAGNOSIS — M779 Enthesopathy, unspecified: Secondary | ICD-10-CM

## 2018-03-31 DIAGNOSIS — M778 Other enthesopathies, not elsewhere classified: Secondary | ICD-10-CM

## 2018-03-31 DIAGNOSIS — M7751 Other enthesopathy of right foot: Secondary | ICD-10-CM

## 2018-04-01 NOTE — Progress Notes (Signed)
Subjective:  Patient ID: Carla Kelly, female    DOB: [birth year],  MRN: [id number] HPI Chief Complaint  Patient presents with  . Toe Pain    4th toe right - hammer toe x years, tender, developing corn, surgery on 5th toe webbed to 4th toe, no treatment  . Foot Pain    Hallux/1st MPJ left - dropped a can of green beans on toe 13 years ago, now can't bend toe at joint  . New Patient (Initial Visit)    84 y.o. female presents with the above complaint.   Review of systems: Denies fever chills nausea vomiting muscle aches pains back pain calf pain chest pain shortness of breath.  Past Medical History:  Diagnosis Date  . Adenomatous colon polyp   . Asthma   . Depression   . Disorder of bone and cartilage, unspecified   . Embolism and thrombosis of unspecified site   . Fibromyalgia   . History of herpes zoster   . HLD (hyperlipidemia)   . Hypoglycemia   . Hypothyroidism   . IBS (irritable bowel syndrome)   . Mitral valve disorder    "leaking mitral valve-very mild"  . Orbital floor fracture ([place]) 10/16   with a fall   . Osteoarthritis   . Osteoarthrosis, unspecified whether generalized or localized, unspecified site   . Other B-complex deficiencies   . Psoriasis   . Stroke ([place])   . Swelling of extremity, right    right leg  . 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 Unspecified urinary incontinence   . Unspecified vitamin D deficiency    Past Surgical History:  Procedure Laterality Date  . ABDOMINAL HYSTERECTOMY  1970   fibroids; endometriosis  . APPENDECTOMY    . bladder tack  5/05  . BREAST BIOPSY Right 1995  . BREAST CYST ASPIRATION Left 03/17/2015  . CATARACT EXTRACTION Right 12/13   Dr George Ina  . CATARACT EXTRACTION W/PHACO Left 05/10/2015   Procedure: CATARACT EXTRACTION PHACO AND INTRAOCULAR LENS PLACEMENT (IOC);  Surgeon: Birder Robson, MD;  Location: [facility];  Service: Ophthalmology;  Laterality: Left;  [place] 00:59 AP% 26.7 CDE 15.85 fluid pack  lot#[id number]  . CERVICAL DISC SURGERY     x 2  . CHOLECYSTECTOMY  2004  . COLONOSCOPY  2/02   polyps, BE-polyps  . EYE SURGERY     right eye  . MOUTH SURGERY     lower teeth have 4 studs to keep in place  . TONSILLECTOMY    . TOTAL KNEE ARTHROPLASTY Right 08/13/2014   Procedure: RIGHT TOTAL KNEE ARTHROPLASTY;  Surgeon: Mcarthur Rossetti, MD;  Location: [facility];  Service: Orthopedics;  Laterality: Right;  . TYMPANOPLASTY    . US TRANSVAGINAL PELVIC MODIFIED  10/02   small left ovarian cyst    Current Outpatient Medications:  .  albuterol (PROVENTIL) 4 MG tablet, Take 1 tablet (4 mg total) by mouth every 4 (four) hours as needed., Disp: 30 tablet, Rfl: 11 .  aspirin 325 MG tablet, Take 325 mg by mouth daily., Disp: , Rfl:  .  Biotin 2500 MCG CAPS, Take 2,500 mcg by mouth daily., Disp: , Rfl:  .  Calcium 1500 MG tablet, Take 1,500 mg by mouth at bedtime. , Disp: , Rfl:  .  Calcium-Phosphorus-Vitamin D (CALCIUM/D3 ADULT GUMMIES PO), Take 500 mg by mouth daily., Disp: , Rfl:  .  cholecalciferol (VITAMIN D) 1000 units tablet, Take 2,000 Units by mouth daily., Disp: , Rfl:  .  diazepam (VALIUM)  5 MG tablet, take 1 tablet by mouth three times a day if needed, Disp: 90 tablet, Rfl: 0 .  furosemide (LASIX) 40 MG tablet, Take 1 tablet (40 mg total) by mouth daily., Disp: 30 tablet, Rfl: 11 .  Multiple Vitamin (MULTIVITAMIN) tablet, Take 1 tablet by mouth every morning. , Disp: , Rfl:  .  Prenatal Vit-Fe Fumarate-FA (PRENATAL PO), Take 1 tablet by mouth daily., Disp: , Rfl:  .  SYNTHROID 88 MCG tablet, TAKE 1 TABLET BY MOUTH EVERY MORNING ON AN EMPTY STOMACH, Disp: 90 tablet, Rfl: 0 .  UNABLE TO FIND, Med Name: Prevagen, Disp: , Rfl:   Allergies  Allergen Reactions  . Alcohol-Sulfur [Sulfur] Shortness Of Breath and Swelling    Alcohol po swells throat  . Ergocalciferol Other (See Comments)    REACTION: GI side effects  . Morphine Nausea And Vomiting  . Tolterodine Tartrate Other (See  Comments)    REACTION: ? reaction  . Alcohol Other (See Comments) and Swelling  . Myrbetriq [Mirabegron] Other (See Comments)    GI Upset  . Nortriptyline Hcl Other (See Comments)    unknown   Review of Systems Objective:  There were no vitals filed for this visit.  General: Well developed, nourished, in no acute distress, alert and oriented x3   Dermatological: Skin is warm, dry and supple bilateral. Nails x 10 are well maintained; remaining integument appears unremarkable at this time. There are no open sores, no preulcerative lesions, no rash or signs of infection present.  Reactive hyperkeratosis to the dorsal aspect of the PIPJ fourth digit right foot.  There is no erythema cellulitis drainage or odor no open lesion or wound.  Vascular: Dorsalis Pedis artery and Posterior Tibial artery pedal pulses are 2/4 bilateral with immedate capillary fill time. Pedal hair growth present. No varicosities and no lower extremity edema present bilateral.   Neruologic: Grossly intact via light touch bilateral. Vibratory intact via tuning fork bilateral. Protective threshold with Semmes Wienstein monofilament intact to all pedal sites bilateral. Patellar and Achilles deep tendon reflexes 2+ bilateral. No Babinski or clonus noted bilateral.   Musculoskeletal: No gross boney pedal deformities bilateral. No pain, crepitus, or limitation noted with foot and ankle range of motion bilateral. Muscular strength 5/5 in all groups tested bilateral.  Hammertoe deformity fourth digit right foot resulting in contracted PIPJ.  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  Gait: Unassisted, Nonantalgic.    Radiographs:  Radiographs taken today demonstrate no acute findings.  Hammertoe deformity fourth digit of the right foot and an apparent arthroplasty fifth digit right foot.  Assessment & Plan:   Assessment: Hammertoe deformity with:  Corn.  Plan: Discussed etiology pathology conservative versus surgical therapies.  Debrided reactive hyperkeratosis for her today.  She understands that the only way to correct this is a surgical correction.  We discussed this in great detail today as we discussed hammertoe repair with screw fixation fourth right.  She will follow-up with her daughter to discuss surgical intervention and care.     Shiloh Swopes T. [place], [place]

## 2018-04-02 ENCOUNTER — Ambulatory Visit: Payer: Medicare Other | Admitting: Podiatry

## 2018-04-20 ENCOUNTER — Other Ambulatory Visit: Payer: Self-pay | Admitting: Family Medicine

## 2018-04-25 DIAGNOSIS — H43813 Vitreous degeneration, bilateral: Secondary | ICD-10-CM | POA: Diagnosis not present

## 2018-04-28 ENCOUNTER — Other Ambulatory Visit: Payer: Self-pay | Admitting: Family Medicine

## 2018-04-28 DIAGNOSIS — Z1231 Encounter for screening mammogram for malignant neoplasm of breast: Secondary | ICD-10-CM

## 2018-05-01 ENCOUNTER — Ambulatory Visit
Admission: RE | Admit: 2018-05-01 | Discharge: 2018-05-01 | Disposition: A | Discharge status: Home / Self Care | Payer: Medicare Other | Source: Ambulatory Visit | Attending: Family Medicine | Admitting: Family Medicine

## 2018-05-01 DIAGNOSIS — Z1231 Encounter for screening mammogram for malignant neoplasm of breast: Secondary | ICD-10-CM | POA: Diagnosis not present

## 2018-05-02 ENCOUNTER — Ambulatory Visit: Payer: Self-pay | Admitting: *Deleted

## 2018-05-02 NOTE — Telephone Encounter (Signed)
Pt reports epigastric pain, 3/10 presently, intermittent, "Gnawing."  Onset 04/21/18. States "Had eaten [place] and that night woke up with pain end of breastbone, top of stomach."    States took Tums which helped but keeps reoccurring. Denies any nausea, vomiting,diarrhea, diaphoresis. States pain does not radiate. Also reports large vitamin (glucosamine) got stuck in throat 2 weeks ago "Which probably contributed to the discomfort."  States certain foods aggravate discomfort, others help. Has been taking OTC zantac for 9 days "It has helped."  Also states "Burping a lot." Appt made with Dr. Glori Bickers for Tuesday, 05/06/18 as her preference to see PCP. Care advise given per protocol. Instructed to call back if symptoms worsen, pain radiates, any nausea, vomiting or fever.  Reason for Disposition . Abdominal pains regularly occur about 1 hour after meals  Answer Assessment - Initial Assessment Questions 1. LOCATION: "Where does it hurt?"      Epigastric area; " Right below breastbone, top of stomach." 2. RADIATION: "Does the pain shoot anywhere else?" (e.g., chest, back)     no 3. ONSET: "When did the pain begin?" (e.g., minutes, hours or days ago)      04/21/18 4. SUDDEN: "Gradual or sudden onset?"     gradual 5. PATTERN "Does the pain come and go, or is it constant?"    - If constant: "Is it getting better, staying the same, or worsening?"      (Note: Constant means the pain never goes away completely; most serious pain is constant and it progresses)     - If intermittent: "How long does it last?" "Do you have pain now?"     (Note: Intermittent means the pain goes away completely between bouts)     intermittent 6. SEVERITY: "How bad is the pain?"  (e.g., Scale 1-10; mild, moderate, or severe)    - MILD (1-3): doesn't interfere with normal activities, abdomen soft and not tender to touch     - MODERATE (4-7): interferes with normal activities or awakens from sleep, tender to touch     - SEVERE  (8-10): excruciating pain, doubled over, unable to do any normal activities       Mild 3/10 7. RECURRENT SYMPTOM: "Have you ever had this type of abdominal pain before?" If so, ask: "When was the last time?" and "What happened that time?"      no 8. AGGRAVATING FACTORS: "Does anything seem to cause this pain?" (e.g., foods, stress, alcohol)     Some foods 9. CARDIAC SYMPTOMS: "Do you have any of the following symptoms: chest pain, difficulty breathing, sweating, nausea?"     no 10. OTHER SYMPTOMS: "Do you have any other symptoms?" (e.g., fever, vomiting, diarrhea)       no  Protocols used: ABDOMINAL PAIN - UPPER-A-AH

## 2018-05-06 ENCOUNTER — Ambulatory Visit: Payer: Medicare Other | Admitting: Family Medicine

## 2018-05-06 ENCOUNTER — Encounter: Payer: Self-pay | Admitting: Family Medicine

## 2018-05-06 DIAGNOSIS — R1013 Epigastric pain: Secondary | ICD-10-CM | POA: Insufficient documentation

## 2018-05-06 MED ORDER — OMEPRAZOLE 20 MG PO CPDR: 20.0000 mg | DELAYED_RELEASE_CAPSULE | Freq: Every day | ORAL | 3 refills | Status: DC

## 2018-05-06 NOTE — Assessment & Plan Note (Signed)
Strongly suspect gastritis in light of some improvement with zantac and belching  Px omeprazole 20 mg in am  Continue ranitidine 150 in evening  Bland diet  Handout given Lab for cbc/cmet and lipase today  Will check in when labs are back  Update if not starting to improve in a week or if worsening

## 2018-05-06 NOTE — Progress Notes (Signed)
Subjective:    Patient ID: Carla Kelly, female    DOB: 09/26/1933, 84 y.o.   MRN: [id number]  HPI  Here for abdominal pain and GERD symptoms   5/27 started with pain in epigastric area  She bought otc zantac - took 1-2 per day 150 mg -it helped but did not cure her   Watches what she eats - for example coffee/caffeine  Cut out chocolate and spicy food  Eating really bland   No n/v  occ has some heartburn-but not often   No change in bm  Had a big bm on sat and 3 times today -normal for her today No blood  No black stool   Takes asa 325 No other nsaids  Cut out her vitamins however   Has never had an ulcer before   No more stress than usual     Wt Readings from Last 3 Encounters:  05/06/18 154 lb (69.9 kg)  03/12/18 157 lb (71.2 kg)  03/06/18 156 lb 8 oz (71 kg)   24.48 kg/m   Patient Active Problem List   Diagnosis Date Noted  . Epigastric pain 05/06/2018  . Hammer toe of right foot 03/12/2018  . Screening mammogram, encounter for 03/04/2017  . Flank pain 08/29/2016  . Routine general medical examination at a health care facility 02/12/2016  . Estrogen deficiency 02/15/2015  . Colon cancer screening 02/15/2015  . Arthritis of right knee 08/13/2014  . Status post total right knee replacement 08/13/2014  . Transient global amnesia 02/20/2014  . Encounter for Medicare annual wellness exam 12/07/2013  . Psoriasis 02/16/2013  . Other screening mammogram 08/19/2012  . Pedal edema 03/11/2012  . B12 deficiency 09/05/2009  . Vitamin D deficiency 11/08/2008  . Hyperlipidemia 08/25/2008  . Osteoporosis 08/25/2008  . History of colonic polyps 08/25/2008  . HERPES ZOSTER, HX OF 06/03/2008  . Hypothyroidism 08/04/2007  . HYPOGLYCEMIA 08/04/2007  . ASTHMA 08/04/2007  . IRRITABLE BOWEL SYNDROME 08/04/2007  . VAGINITIS, ATROPHIC 08/04/2007  . Osteoarthrosis, unspecified whether generalized or localized, unspecified site 08/04/2007  . FIBROMYALGIA 08/04/2007  .  URINARY INCONTINENCE 08/04/2007   Past Medical History:  Diagnosis Date  . Adenomatous colon polyp   . Asthma   . Depression   . Disorder of bone and cartilage, unspecified   . Embolism and thrombosis of unspecified site   . Fibromyalgia   . History of herpes zoster   . HLD (hyperlipidemia)   . Hypoglycemia   . Hypothyroidism   . IBS (irritable bowel syndrome)   . Mitral valve disorder    "leaking mitral valve-very mild"  . Orbital floor fracture ([place]) 10/16   with a fall   . Osteoarthritis   . Osteoarthrosis, unspecified whether generalized or localized, unspecified site   . Other B-complex deficiencies   . Psoriasis   . Stroke ([place])   . Swelling of extremity, right    right leg  . 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 Unspecified urinary incontinence   . Unspecified vitamin D deficiency    Past Surgical History:  Procedure Laterality Date  . ABDOMINAL HYSTERECTOMY  1970   fibroids; endometriosis  . APPENDECTOMY    . bladder tack  5/05  . BREAST BIOPSY Right 1995  . BREAST CYST ASPIRATION Left 03/17/2015  . CATARACT EXTRACTION Right 12/13   Dr George Ina  . CATARACT EXTRACTION W/PHACO Left 05/10/2015   Procedure: CATARACT EXTRACTION PHACO AND INTRAOCULAR LENS PLACEMENT (IOC);  Surgeon: Birder Robson, MD;  Location: [facility]  ORS;  Service: Ophthalmology;  Laterality: Left;  [place] 00:59 AP% 26.7 CDE 15.85 fluid pack lot#[id number]  . CERVICAL DISC SURGERY     x 2  . CHOLECYSTECTOMY  2004  . COLONOSCOPY  2/02   polyps, BE-polyps  . EYE SURGERY     right eye  . MOUTH SURGERY     lower teeth have 4 studs to keep in place  . TONSILLECTOMY    . TOTAL KNEE ARTHROPLASTY Right 08/13/2014   Procedure: RIGHT TOTAL KNEE ARTHROPLASTY;  Surgeon: Mcarthur Rossetti, MD;  Location: [facility] ORS;  Service: Orthopedics;  Laterality: Right;  . TYMPANOPLASTY    . US TRANSVAGINAL PELVIC MODIFIED  10/02   small left ovarian cyst   Social History   Tobacco Use  . Smoking status: Never  Smoker  . Smokeless tobacco: Never Used  Substance Use Topics  . Alcohol use: No    Comment: "is allergic"  . Drug use: No   Family History  Problem Relation Age of Onset  . Heart attack Mother   . Stroke Mother        several  . Lupus Brother   . Other Sister        benign brain tumor  . Asthma Daughter   . Breast cancer Daughter   . Other Brother        heart problems  . Colon cancer Unknown        1st cousin   Allergies  Allergen Reactions  . Alcohol-Sulfur [Sulfur] Shortness Of Breath and Swelling    Alcohol po swells throat  . Ergocalciferol Other (See Comments)    REACTION: GI side effects  . Morphine Nausea And Vomiting  . Tolterodine Tartrate Other (See Comments)    REACTION: ? reaction  . Alcohol Other (See Comments) and Swelling  . Myrbetriq [Mirabegron] Other (See Comments)    GI Upset  . Nortriptyline Hcl Other (See Comments)    unknown   Current Outpatient Medications on File Prior to Visit  Medication Sig Dispense Refill  . albuterol (PROVENTIL) 4 MG tablet Take 1 tablet (4 mg total) by mouth every 4 (four) hours as needed. 30 tablet 11  . aspirin 325 MG tablet Take 325 mg by mouth daily.    . Biotin 2500 MCG CAPS Take 2,500 mcg by mouth daily.    . Calcium 1500 MG tablet Take 1,500 mg by mouth at bedtime.     . Calcium-Phosphorus-Vitamin D (CALCIUM/D3 ADULT GUMMIES PO) Take 500 mg by mouth daily.    . cholecalciferol (VITAMIN D) 1000 units tablet Take 2,000 Units by mouth daily.    . diazepam (VALIUM) 5 MG tablet take 1 tablet by mouth three times a day if needed 90 tablet 0  . furosemide (LASIX) 40 MG tablet Take 1 tablet (40 mg total) by mouth daily. 30 tablet 11  . Multiple Vitamin (MULTIVITAMIN) tablet Take 1 tablet by mouth every morning.     . Prenatal Vit-Fe Fumarate-FA (PRENATAL PO) Take 1 tablet by mouth daily.    Marland Kitchen SYNTHROID 88 MCG tablet TAKE 1 TABLET BY MOUTH EVERY MORNING ON AN EMPTY STOMACH 90 tablet 3  . UNABLE TO FIND Med Name: Prevagen      No current facility-administered medications on file prior to visit.     Review of Systems  Constitutional: Positive for appetite change. Negative for activity change, fatigue, fever and unexpected weight change.  HENT: Negative for congestion, ear pain, rhinorrhea, sinus pressure and sore throat.  Eyes: Negative for pain, redness and visual disturbance.  Respiratory: Negative for cough, shortness of breath and wheezing.   Cardiovascular: Negative for chest pain and palpitations.  Gastrointestinal: Positive for abdominal pain. Negative for abdominal distention, anal bleeding, blood in stool, constipation, diarrhea, nausea, rectal pain and vomiting.       Belching occ heartburn   Endocrine: Negative for polydipsia and polyuria.  Genitourinary: Negative for dysuria, frequency and urgency.  Musculoskeletal: Negative for arthralgias, back pain and myalgias.  Skin: Negative for pallor and rash.  Allergic/Immunologic: Negative for environmental allergies.  Neurological: Negative for dizziness, syncope and headaches.  Hematological: Negative for adenopathy. Does not bruise/bleed easily.  Psychiatric/Behavioral: Negative for decreased concentration and dysphoric mood. The patient is not nervous/anxious.        Objective:   Physical Exam  Constitutional: She appears well-developed and well-nourished. No distress.  Well appearing   HENT:  Head: Normocephalic and atraumatic.  Mouth/Throat: Oropharynx is clear and moist.  Eyes: Pupils are equal, round, and reactive to light. Conjunctivae and EOM are normal. No scleral icterus.  Neck: Normal range of motion. Neck supple.  Cardiovascular: Normal rate, regular rhythm and normal heart sounds.  Pulmonary/Chest: Effort normal and breath sounds normal. No respiratory distress. She has no wheezes. She has no rales.  Abdominal: Soft. Bowel sounds are normal. She exhibits no distension, no pulsatile liver, no abdominal bruit, no pulsatile midline  mass and no mass. There is no hepatosplenomegaly. There is tenderness in the epigastric area. There is no rigidity, no rebound, no guarding, no CVA tenderness, no tenderness at McBurney's point and negative Murphy's sign. No hernia.  Lymphadenopathy:    She has no cervical adenopathy.  Neurological: She is alert.  Skin: Skin is warm and dry. No erythema. No pallor.  No pallor or jaundice  Psychiatric: She has a normal mood and affect.  Pleasant           Assessment & Plan:   Problem List Items Addressed This Visit      Other   Epigastric pain    Strongly suspect gastritis in light of some improvement with zantac and belching  Px omeprazole 20 mg in am  Continue ranitidine 150 in evening  Bland diet  Handout given Lab for cbc/cmet and lipase today  Will check in when labs are back  Update if not starting to improve in a week or if worsening        Relevant Orders   CBC with Differential/Platelet   Comprehensive metabolic panel   Lipase

## 2018-05-06 NOTE — Patient Instructions (Addendum)
Continue the zantac 150 mg at bedtime daily  Take the omeprazole (generic prilosec) in am 15 minutes or more before breakfast   I think this is gastritis- irritated/inflamed stomach lining   Continue to eat bland Watch for dark stool (can be a sign of bleeding)  Labs today   We will check in with you after labs return   Update if not starting to improve in a week or if worsening

## 2018-05-07 LAB — CBC WITH DIFFERENTIAL/PLATELET
BASOS ABS: 0.1 10*3/uL (ref 0.0–0.1)
BASOS PCT: 1.2 % (ref 0.0–3.0)
EOS ABS: 0.2 10*3/uL (ref 0.0–0.7)
Eosinophils Relative: 3.1 % (ref 0.0–5.0)
HCT: 39.8 % (ref 36.0–46.0)
HEMOGLOBIN: 13.6 g/dL (ref 12.0–15.0)
LYMPHS PCT: 41 % (ref 12.0–46.0)
Lymphs Abs: 2.5 10*3/uL (ref 0.7–4.0)
MCHC: 34.1 g/dL (ref 30.0–36.0)
MCV: 94 fl (ref 78.0–100.0)
MONO ABS: 0.4 10*3/uL (ref 0.1–1.0)
Monocytes Relative: 6.1 % (ref 3.0–12.0)
Neutro Abs: 3 10*3/uL (ref 1.4–7.7)
Neutrophils Relative %: 48.6 % (ref 43.0–77.0)
Platelets: 187 10*3/uL (ref 150.0–400.0)
RBC: 4.23 Mil/uL (ref 3.87–5.11)
RDW: 12.9 % (ref 11.5–15.5)
WBC: 6.2 10*3/uL (ref 4.0–10.5)

## 2018-05-07 LAB — COMPREHENSIVE METABOLIC PANEL
ALT: 10 U/L (ref 0–35)
AST: 15 U/L (ref 0–37)
Albumin: 4 g/dL (ref 3.5–5.2)
Alkaline Phosphatase: 64 U/L (ref 39–117)
BILIRUBIN TOTAL: 0.3 mg/dL (ref 0.2–1.2)
BUN: 18 mg/dL (ref 6–23)
CHLORIDE: 104 meq/L (ref 96–112)
CO2: 31 meq/L (ref 19–32)
CREATININE: 0.79 mg/dL (ref 0.40–1.20)
Calcium: 9.4 mg/dL (ref 8.4–10.5)
GFR: 73.82 mL/min (ref >60.00)
Glucose, Bld: 75 mg/dL (ref 70–99)
Potassium: 4.3 mEq/L (ref 3.5–5.1)
SODIUM: 141 meq/L (ref 135–145)
Total Protein: 6.8 g/dL (ref 6.0–8.3)

## 2018-05-07 LAB — LIPASE: Lipase: 38 U/L (ref 11.0–59.0)

## 2018-05-10 ENCOUNTER — Telehealth: Payer: Self-pay | Admitting: Family Medicine

## 2018-05-10 DIAGNOSIS — R1013 Epigastric pain: Secondary | ICD-10-CM

## 2018-05-10 NOTE — Telephone Encounter (Signed)
Ref done  Will route to [facility] 

## 2018-05-12 ENCOUNTER — Encounter: Payer: Self-pay | Admitting: Physician Assistant

## 2018-05-12 NOTE — Telephone Encounter (Signed)
Appt made and patient aware.  

## 2018-05-15 ENCOUNTER — Ambulatory Visit: Payer: Medicare Other | Admitting: Physician Assistant

## 2018-05-15 ENCOUNTER — Encounter: Payer: Self-pay | Admitting: Physician Assistant

## 2018-05-15 VITALS — BP 130/72 | HR 104 | Ht 66.5 in

## 2018-05-15 DIAGNOSIS — K625 Hemorrhage of anus and rectum: Secondary | ICD-10-CM | POA: Diagnosis not present

## 2018-05-15 DIAGNOSIS — R197 Diarrhea, unspecified: Secondary | ICD-10-CM

## 2018-05-15 DIAGNOSIS — R1013 Epigastric pain: Secondary | ICD-10-CM | POA: Diagnosis not present

## 2018-05-15 DIAGNOSIS — R109 Unspecified abdominal pain: Secondary | ICD-10-CM | POA: Diagnosis not present

## 2018-05-15 DIAGNOSIS — R1310 Dysphagia, unspecified: Secondary | ICD-10-CM

## 2018-05-15 MED ORDER — PANTOPRAZOLE SODIUM 40 MG PO TBEC: 40.0000 mg | DELAYED_RELEASE_TABLET | Freq: Two times a day (BID) | ORAL | 2 refills | Status: DC

## 2018-05-15 NOTE — Progress Notes (Signed)
Reviewed and agree with initial management plan.  Karie Skowron T. Liora Myles, MD FACG 

## 2018-05-15 NOTE — Patient Instructions (Addendum)
If you are age 82 or older, your body mass index should be between 23-30. Your Body mass index is 24.48 kg/m. If this is out of the aforementioned range listed, please consider follow up with your Primary Care Provider.  If you are age 7 or younger, your body mass index should be between 19-25. Your Body mass index is 24.48 kg/m. If this is out of the aformentioned range listed, please consider follow up with your Primary Care Provider.   We have sent the following medications to your pharmacy for you to pick up at your convenience: Pantoprazole 40 mg  STOP Prilosec (Omeprazole)  You have been given IBgard samples/coupons.  Take 2 capsules twice daily for 1 month.  You have been given GERD Literature.  Patty, our nurse will call in 2 weeks to get an update.  Follow up with me on June 12, 2018 at 3:15 pm.  Thank you for choosing me and [facility].   Ellouise Newer, PA-C

## 2018-05-15 NOTE — Progress Notes (Signed)
Chief Complaint: Epigastric pain  HPI:    Carla Kelly is an 84 year old female with a past medical history as listed below, follows with Dr. Fuller Plan for screening colonoscopies and who was referred to me by Abner Greenspan, MD for a complaint of epigastric pain.      06/08/2010 colonoscopy due to hematochezia and weight loss and follow-up of adenomatous polyps in 2002.  Findings of a 5 mm sessile polyp in the descending colon and 2 polyps in the proximal transverse colon, repeat recommended in 5 years.  Pathology showed tubular adenomas.  The chart was later reviewed and was not recommended patient have a repeat colonoscopy.    05/06/2018 office visit with PCP for epigastric pain, described starting 04/21/2018 with pain.  Over-the-counter Zantac helps some.  At that time Omeprazole was prescribed 20 mg daily and continued on Ranitidine 150 mg in the evening.  CBC see med and lipase were checked.  Labs are normal.    Today, presents with daughter who does assist with history, explains that on May 27 she awoke with an epigastric pain which was a "terrible hurt" she rated this is a 10/10 when it started, it ws big as her fist up under her rib cage in the middle.  She immediately started over-the-counter Zantac which seemed to help her symptoms some but then saw her PCP as above and continued with pain.  Started Omeprazole 20 mg daily which has been helping some.  Currently pain is only 1-2/10.      Her biggest concern today is that she has been having an increase in loose stools over the past few weeks as well generalized abdominal cramping pain.  Has had 2 episodes of small amount of bright red blood on the toilet paper after wiping.      Also had one instance of dysphagia where she felt as though [place] food was stuck in her throat.  Patient has been on the [place] and trying to avoid anything which irritates her stomach.  Does describe an excess of eructations lately.    Denies weight loss, fever, chills,  melena or symptoms that awaken her from sleep.  Past Medical History:  Diagnosis Date  . Adenomatous colon polyp   . Asthma   . Depression   . Disorder of bone and cartilage, unspecified   . Embolism and thrombosis of unspecified site   . Fibromyalgia   . History of herpes zoster   . HLD (hyperlipidemia)   . Hypoglycemia   . Hypothyroidism   . IBS (irritable bowel syndrome)   . Mitral valve disorder    "leaking mitral valve-very mild"  . Orbital floor fracture ([place]) 10/16   with a fall   . Osteoarthritis   . Osteoarthrosis, unspecified whether generalized or localized, unspecified site   . Other B-complex deficiencies   . Psoriasis   . Stroke ([place])   . Swelling of extremity, right    right leg  . 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 Unspecified urinary incontinence   . Unspecified vitamin D deficiency     Past Surgical History:  Procedure Laterality Date  . ABDOMINAL HYSTERECTOMY  1970   fibroids; endometriosis  . APPENDECTOMY    . bladder tack  5/05  . BREAST BIOPSY Right 1995  . BREAST CYST ASPIRATION Left 03/17/2015  . CATARACT EXTRACTION Right 12/13   Dr George Ina  . CATARACT EXTRACTION W/PHACO Left 05/10/2015   Procedure: CATARACT EXTRACTION PHACO AND INTRAOCULAR LENS PLACEMENT (IOC);  Surgeon: Birder Robson, MD;  Location: [facility] ORS;  Service: Ophthalmology;  Laterality: Left;  [place] 00:59 AP% 26.7 CDE 15.85 fluid pack lot#[id number]  . CERVICAL DISC SURGERY     x 2  . CHOLECYSTECTOMY  2004  . COLONOSCOPY  2/02   polyps, BE-polyps  . EYE SURGERY     right eye  . MOUTH SURGERY     lower teeth have 4 studs to keep in place  . TONSILLECTOMY    . TOTAL KNEE ARTHROPLASTY Right 08/13/2014   Procedure: RIGHT TOTAL KNEE ARTHROPLASTY;  Surgeon: Mcarthur Rossetti, MD;  Location: [facility] ORS;  Service: Orthopedics;  Laterality: Right;  . TYMPANOPLASTY    . US TRANSVAGINAL PELVIC MODIFIED  10/02   small left ovarian cyst    Current Outpatient Medications    Medication Sig Dispense Refill  . albuterol (PROVENTIL) 4 MG tablet Take 1 tablet (4 mg total) by mouth every 4 (four) hours as needed. 30 tablet 11  . aspirin 325 MG tablet Take 325 mg by mouth daily.    . Biotin 2500 MCG CAPS Take 2,500 mcg by mouth daily.    . Calcium 1500 MG tablet Take 1,500 mg by mouth at bedtime.     . Calcium-Phosphorus-Vitamin D (CALCIUM/D3 ADULT GUMMIES PO) Take 500 mg by mouth daily.    . cholecalciferol (VITAMIN D) 1000 units tablet Take 2,000 Units by mouth daily.    . diazepam (VALIUM) 5 MG tablet take 1 tablet by mouth three times a day if needed 90 tablet 0  . furosemide (LASIX) 40 MG tablet Take 1 tablet (40 mg total) by mouth daily. 30 tablet 11  . Multiple Vitamin (MULTIVITAMIN) tablet Take 1 tablet by mouth every morning.     Marland Kitchen omeprazole (PRILOSEC) 20 MG capsule Take 1 capsule (20 mg total) by mouth daily. In am 15 minutes before breakfast 30 capsule 3  . Prenatal Vit-Fe Fumarate-FA (PRENATAL PO) Take 1 tablet by mouth daily.    Marland Kitchen SYNTHROID 88 MCG tablet TAKE 1 TABLET BY MOUTH EVERY MORNING ON AN EMPTY STOMACH 90 tablet 3  . UNABLE TO FIND Med Name: Prevagen     No current facility-administered medications for this visit.     Allergies as of 05/15/2018 - Review Complete 05/15/2018  Allergen Reaction Noted  . Alcohol-sulfur [sulfur] Shortness Of Breath and Swelling 02/20/2014  . Ergocalciferol Other (See Comments) 11/08/2008  . Morphine Nausea And Vomiting 08/04/2007  . Tolterodine tartrate Other (See Comments) 08/04/2007  . Alcohol Other (See Comments) and Swelling 10/27/1982  . Myrbetriq [mirabegron] Other (See Comments) 02/20/2014  . Nortriptyline hcl Other (See Comments) 08/04/2007    Family History  Problem Relation Age of Onset  . Heart attack Mother   . Stroke Mother        several  . Lupus Brother   . Other Sister        benign brain tumor  . Asthma Daughter   . Breast cancer Daughter   . Other Brother        heart problems  .  Colon cancer Unknown        1st cousin    Social History   Socioeconomic History  . Marital status: Widowed    Spouse name: Not on file  . Number of children: 4  . Years of education: Not on file  . Highest education level: Not on file  Occupational History  . Occupation: Retired  Scientific laboratory technician  . Financial resource strain: Not on file  .  Food insecurity:    Worry: Not on file    Inability: Not on file  . Transportation needs:    Medical: Not on file    Non-medical: Not on file  Tobacco Use  . Smoking status: Never Smoker  . Smokeless tobacco: Never Used  Substance and Sexual Activity  . Alcohol use: No    Comment: "is allergic"  . Drug use: No  . Sexual activity: Not Currently  Lifestyle  . Physical activity:    Days per week: Not on file    Minutes per session: Not on file  . Stress: Not on file  Relationships  . Social connections:    Talks on phone: Not on file    Gets together: Not on file    Attends religious service: Not on file    Active member of club or organization: Not on file    Attends meetings of clubs or organizations: Not on file    Relationship status: Not on file  . Intimate partner violence:    Fear of current or ex partner: Not on file    Emotionally abused: Not on file    Physically abused: Not on file    Forced sexual activity: Not on file  Other Topics Concern  . Not on file  Social History Narrative   Widowed      4 children      Retired      Regular exercise      Grandson lives with her    Review of Systems:    Constitutional: No weight loss, fever or chills Skin: No rash Cardiovascular: No chest pain Respiratory: No SOB  Gastrointestinal: See HPI and otherwise negative Genitourinary: No dysuria  Neurological: No headache Musculoskeletal: No new muscle or joint pain Hematologic: No bruising Psychiatric: No history of depression or anxiety   Physical Exam:  Vital signs: BP 130/72   Pulse (!) 104   Ht 5' 6.5" (1.689  m)   SpO2 98%   BMI 24.48 kg/m   Constitutional:   Pleasant Elderly Caucasian female appears to be in NAD, Well developed, Well nourished, alert and cooperative Head:  Normocephalic and atraumatic. Eyes:   PEERL, EOMI. No icterus. Conjunctiva pink. Ears:  Normal auditory acuity. Neck:  Supple Throat: Oral cavity and pharynx without inflammation, swelling or lesion.  Respiratory: Respirations even and unlabored. Lungs clear to auscultation bilaterally.   No wheezes, crackles, or rhonchi.  Cardiovascular: Normal S1, S2. No MRG. Regular rate and rhythm. No peripheral edema, cyanosis or pallor.  Gastrointestinal:  Soft, nondistended, Moderate generalized ttp, No rebound or guarding. Normal bowel sounds. No appreciable masses or hepatomegaly. Rectal:  Not performed.  Msk:  Symmetrical without gross deformities. Without edema, no deformity or joint abnormality.  Neurologic:  Alert and  oriented x4;  grossly normal neurologically.  Skin:   Dry and intact without significant lesions or rashes. Psychiatric: Demonstrates good judgement and reason without abnormal affect or behaviors.  RELEVANT LABS AND IMAGING: CBC    Component Value Date/Time   WBC 6.2 05/06/2018 1626   RBC 4.23 05/06/2018 1626   HGB 13.6 05/06/2018 1626   HGB 12.3 10/16/2012 1306   HCT 39.8 05/06/2018 1626   PLT 187.0 05/06/2018 1626   MCV 94.0 05/06/2018 1626   MCH 30.2 11/01/2016 1628   MCHC 34.1 05/06/2018 1626   RDW 12.9 05/06/2018 1626   LYMPHSABS 2.5 05/06/2018 1626   MONOABS 0.4 05/06/2018 1626   EOSABS 0.2 05/06/2018 1626   BASOSABS 0.1  05/06/2018 1626    CMP     Component Value Date/Time   NA 141 05/06/2018 1626   K 4.3 05/06/2018 1626   K 4.0 10/16/2012 1306   CL 104 05/06/2018 1626   CO2 31 05/06/2018 1626   GLUCOSE 75 05/06/2018 1626   BUN 18 05/06/2018 1626   CREATININE 0.79 05/06/2018 1626   CALCIUM 9.4 05/06/2018 1626   PROT 6.8 05/06/2018 1626   ALBUMIN 4.0 05/06/2018 1626   AST 15  05/06/2018 1626   ALT 10 05/06/2018 1626   ALKPHOS 64 05/06/2018 1626   BILITOT 0.3 05/06/2018 1626   GFRNONAA 78 (L) 08/13/2014 1001   GFRAA >90 08/13/2014 1001    Assessment: 1.  Epigastric abdominal pain: Started 04/21/2009, was a 10/10 down to a 1-2/10 with Omeprazole 20 mg daily, though continues with some general stomach upset; consider gastritis versus PUD versus other 2.  Rectal bleeding: 2 episodes of bright red blood on the toilet paper, does have history of internal and external hemorrhoids 3.  Change in bowel habits: Towards looser stools occasionally accompanied by abdominal cramping; consider relation to PPI versus IBS versus other 4.  History of tubular adenomas: Last colonoscopy in 2011, repeat not recommended for surveillance 5. Dysphagia: one instance, consider relation to reflux esophagitis vs other  Plan: 1.  Prescribed Pantoprazole 40 mg twice daily, 30-60 minutes before eating breakfast and dinner.  #60 with 1 refill. 2.  Patient told to discontinue her Omeprazole today. 3.  Started the patient on IBGard, 2 capsules twice a day.  Provided her with samples and coupon.  She should continue this for at least a month. 4.  Provided her with a GERD handout.  Did review antireflux diet and lifestyle modifications. 5.  Discussed with patient that if she is not better in a couple of weeks would recommend that we order a barium esophagram for further evaluation.  She may or may not need an EGD in the future. 6.  Did offer the patient an antispasmodics today but she declined.  She does not want to take more medication than she needs.  She does continue with any abdominal discomfort recommend Hyoscyamine sulfate 0.125 mg sublingual tabs as needed. 7.  Patient to follow with me in 3-4 weeks or sooner if necessary.  Ellouise Newer, PA-C [facility] 05/15/2018, 2:33 PM  Cc: Tower, Wynelle Fanny, MD

## 2018-05-21 ENCOUNTER — Telehealth: Payer: Self-pay | Admitting: Physician Assistant

## 2018-05-21 ENCOUNTER — Other Ambulatory Visit: Payer: Medicare Other

## 2018-05-21 DIAGNOSIS — R197 Diarrhea, unspecified: Secondary | ICD-10-CM

## 2018-05-21 DIAGNOSIS — R1013 Epigastric pain: Secondary | ICD-10-CM

## 2018-05-21 DIAGNOSIS — R109 Unspecified abdominal pain: Secondary | ICD-10-CM

## 2018-05-21 NOTE — Telephone Encounter (Signed)
2 Spoke with patient- bad diarrhea this am, bowels didn't move 3 days, then little bit, then diarrhea-hurting in stomach, went with daughter to aerobics yesterday-able to do some- friend with h.pylori- burping some, hurts if empty, would like testing for h.pylori  Patty can you please order h,pylori fecal ag? Thanks-JLL  If this test is negative recommend hyoscyamine Sl tabs BID.

## 2018-05-21 NOTE — Telephone Encounter (Signed)
Carla Kelly the pt will only speak with you, she was advised that a message would be sent to your attention.

## 2018-05-21 NOTE — Telephone Encounter (Signed)
h pylori order in Epic.

## 2018-05-23 ENCOUNTER — Other Ambulatory Visit: Payer: Medicare Other

## 2018-05-23 DIAGNOSIS — R109 Unspecified abdominal pain: Secondary | ICD-10-CM

## 2018-05-23 DIAGNOSIS — R1013 Epigastric pain: Secondary | ICD-10-CM | POA: Diagnosis not present

## 2018-05-23 DIAGNOSIS — R197 Diarrhea, unspecified: Secondary | ICD-10-CM

## 2018-05-26 LAB — HELICOBACTER PYLORI  SPECIAL ANTIGEN
MICRO NUMBER: 90774880
SPECIMEN QUALITY: ADEQUATE

## 2018-05-27 ENCOUNTER — Encounter: Payer: Self-pay | Admitting: Physician Assistant

## 2018-06-02 ENCOUNTER — Encounter: Payer: Self-pay | Admitting: Physician Assistant

## 2018-06-09 ENCOUNTER — Telehealth (INDEPENDENT_AMBULATORY_CARE_PROVIDER_SITE_OTHER): Payer: Self-pay

## 2018-06-09 NOTE — Telephone Encounter (Signed)
Pt's daughter is calling to get the name, model  number and all related information of knee replacement for 07/2014 with Dr. Ninfa Linden

## 2018-06-10 NOTE — Telephone Encounter (Signed)
lmom for Carla Kelly to let me know if she wanted it mailed to her (if so what address) or if she wants to pick it up I can put info at the front desk.

## 2018-06-11 NOTE — Telephone Encounter (Signed)
I called and advised that I will mail it out today

## 2018-06-11 NOTE — Telephone Encounter (Signed)
Also, can this info be added to her MyChart as well as mailed?

## 2018-06-11 NOTE — Telephone Encounter (Signed)
Please mail to address:  [facility] [place], [place] [address]  Keturah Shavers # [phone]

## 2018-06-12 ENCOUNTER — Other Ambulatory Visit (INDEPENDENT_AMBULATORY_CARE_PROVIDER_SITE_OTHER): Payer: Medicare Other

## 2018-06-12 ENCOUNTER — Telehealth: Payer: Self-pay

## 2018-06-12 ENCOUNTER — Ambulatory Visit: Payer: Medicare Other | Admitting: Physician Assistant

## 2018-06-12 ENCOUNTER — Encounter: Payer: Self-pay | Admitting: Physician Assistant

## 2018-06-12 VITALS — BP 116/82 | HR 85 | Ht 66.5 in | Wt 157.0 lb

## 2018-06-12 DIAGNOSIS — R140 Abdominal distension (gaseous): Secondary | ICD-10-CM

## 2018-06-12 DIAGNOSIS — R1030 Lower abdominal pain, unspecified: Secondary | ICD-10-CM

## 2018-06-12 DIAGNOSIS — R1013 Epigastric pain: Secondary | ICD-10-CM | POA: Diagnosis not present

## 2018-06-12 DIAGNOSIS — R194 Change in bowel habit: Secondary | ICD-10-CM | POA: Diagnosis not present

## 2018-06-12 NOTE — Patient Instructions (Signed)
Please purchase the following medications over the counter and take as directed:  IB gard 1 capsule three times a day   A high fiber diet with plenty of fluids (up to 8 glasses of water daily) is suggested to relieve these symptoms.  A fiber supplement once or twice daily can be used to keep bowels regular if needed.  We have given you a high fiber diet handout. Please strive to have 25-30 grams of fiber daily.   Decrease Pantoprazole 40 mg daily.

## 2018-06-12 NOTE — Progress Notes (Signed)
Chief Complaint: Follow-up epigastric pain and loose stools  HPI:    Carla Kelly is a an 84 year old female with a past medical history as listed below, who follows with Dr. Fuller Plan for screening colonoscopies and presents to clinic today for follow-up of her epigastric pain and loose stools.    05/15/2018 office visit discussed epigastric pain rated as a 10/10, better on omeprazole and Zantac.  At that time biggest concern was loose stools over the past few weeks with generalized abdominal cramping.  Also described one instance of dysphagia.  At that visit prescribed Pantoprazole 40 mg twice daily and she was told to discontinue her Omeprazole.  She was started on IBgard 2 capsules twice a day.  Offered antispasmodics and she declined.    05/21/2018 spoke with patient to describe diarrhea and abdominal pain, question of H. pylori by her.  H. pylori fecal antigen was ordered but patient's PPI was not stopped.    Today, patient describes that she is somewhat distressed that she has to pay for a lab at which "was useless".    In discussion of symptoms patient tells me that her epigastric discomfort and problems are "much better".  Currently she complains of a bilateral lower abdominal pain which is somewhat cramping in nature and bloating so much that she has to wear "bigger pants".  Continues with a change in her bowel habits noting that she will have a cycle of normal stool then diarrhea then nothing but "really pea-sized stools" followed by normal stool and diarrhea all over again.  When she does have diarrhea she feels like this "cleans me out".  Believes IBgard 1 tab twice daily has been helping.  Patient has continued on an altered diet, noting that she has left out a lot of things that she would normally eat because she feels as though they may give her increased abdominal discomfort and she is "tired of it".    Daughter asked about possibility of cancer.    Denies fever, chills, blood in her stool,  weight loss, anorexia, nausea or vomiting.  Past Medical History:  Diagnosis Date  . Adenomatous colon polyp   . Asthma   . Depression   . Disorder of bone and cartilage, unspecified   . Embolism and thrombosis of unspecified site   . Fibromyalgia   . History of herpes zoster   . HLD (hyperlipidemia)   . Hypoglycemia   . Hypothyroidism   . IBS (irritable bowel syndrome)   . Mitral valve disorder    "leaking mitral valve-very mild"  . Orbital floor fracture ([place]) 10/16   with a fall   . Osteoarthritis   . Osteoarthrosis, unspecified whether generalized or localized, unspecified site   . Other B-complex deficiencies   . Psoriasis   . Stroke ([place])   . Swelling of extremity, right    right leg  . 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 Unspecified urinary incontinence   . Unspecified vitamin D deficiency     Past Surgical History:  Procedure Laterality Date  . ABDOMINAL HYSTERECTOMY  1970   fibroids; endometriosis  . APPENDECTOMY    . bladder tack  5/05  . BREAST BIOPSY Right 1995  . BREAST CYST ASPIRATION Left 03/17/2015  . CATARACT EXTRACTION Right 12/13   Dr George Ina  . CATARACT EXTRACTION W/PHACO Left 05/10/2015   Procedure: CATARACT EXTRACTION PHACO AND INTRAOCULAR LENS PLACEMENT (IOC);  Surgeon: Birder Robson, MD;  Location: [facility] ORS;  Service: Ophthalmology;  Laterality: Left;  [place] 00:59 AP% 26.7 CDE 15.85 fluid pack lot#[id number]  . CERVICAL DISC SURGERY     x 2  . CHOLECYSTECTOMY  2004  . COLONOSCOPY  2/02   polyps, BE-polyps  . EYE SURGERY     right eye  . MOUTH SURGERY     lower teeth have 4 studs to keep in place  . TONSILLECTOMY    . TOTAL KNEE ARTHROPLASTY Right 08/13/2014   Procedure: RIGHT TOTAL KNEE ARTHROPLASTY;  Surgeon: Mcarthur Rossetti, MD;  Location: [facility];  Service: Orthopedics;  Laterality: Right;  . TYMPANOPLASTY    . US TRANSVAGINAL PELVIC MODIFIED  10/02   small left ovarian cyst    Current Outpatient Medications    Medication Sig Dispense Refill  . albuterol (PROVENTIL) 4 MG tablet Take 1 tablet (4 mg total) by mouth every 4 (four) hours as needed. 30 tablet 11  . aspirin 325 MG tablet Take 325 mg by mouth daily.    . diazepam (VALIUM) 5 MG tablet take 1 tablet by mouth three times a day if needed (Patient taking differently: at bedtime. ) 90 tablet 0  . pantoprazole (PROTONIX) 40 MG tablet Take 1 tablet (40 mg total) by mouth 2 (two) times daily. Take 30-60 minutes before breakfast and dinner. 60 tablet 2  . SYNTHROID 88 MCG tablet TAKE 1 TABLET BY MOUTH EVERY MORNING ON AN EMPTY STOMACH 90 tablet 3   No current facility-administered medications for this visit.     Allergies as of 06/12/2018 - Review Complete 06/12/2018  Allergen Reaction Noted  . Alcohol-sulfur [sulfur] Shortness Of Breath and Swelling 02/20/2014  . Ergocalciferol Other (See Comments) 11/08/2008  . Morphine Nausea And Vomiting 08/04/2007  . Tolterodine tartrate Other (See Comments) 08/04/2007  . Alcohol Other (See Comments) and Swelling 10/27/1982  . Myrbetriq [mirabegron] Other (See Comments) 02/20/2014  . Nortriptyline hcl Other (See Comments) 08/04/2007    Family History  Problem Relation Age of Onset  . Heart attack Mother   . Stroke Mother        several  . Lupus Brother   . Other Sister        benign brain tumor  . Asthma Daughter   . Breast cancer Daughter   . Other Brother        heart problems  . Colon cancer Unknown        1st cousin    Social History   Socioeconomic History  . Marital status: Widowed    Spouse name: Not on file  . Number of children: 4  . Years of education: Not on file  . Highest education level: Not on file  Occupational History  . Occupation: Retired  Scientific laboratory technician  . Financial resource strain: Not on file  . Food insecurity:    Worry: Not on file    Inability: Not on file  . Transportation needs:    Medical: Not on file    Non-medical: Not on file  Tobacco Use  . Smoking  status: Never Smoker  . Smokeless tobacco: Never Used  Substance and Sexual Activity  . Alcohol use: No    Comment: "is allergic"  . Drug use: No  . Sexual activity: Not Currently  Lifestyle  . Physical activity:    Days per week: Not on file    Minutes per session: Not on file  . Stress: Not on file  Relationships  . Social connections:    Talks on phone: Not on file    Gets  together: Not on file    Attends religious service: Not on file    Active member of club or organization: Not on file    Attends meetings of clubs or organizations: Not on file    Relationship status: Not on file  . Intimate partner violence:    Fear of current or ex partner: Not on file    Emotionally abused: Not on file    Physically abused: Not on file    Forced sexual activity: Not on file  Other Topics Concern  . Not on file  Social History Narrative   Widowed      4 children      Retired      Regular exercise      Grandson lives with her    Review of Systems:    Constitutional: No weight loss, fever or chills Cardiovascular: No chest pain Respiratory: No SOB  Gastrointestinal: See HPI and otherwise negative   Physical Exam:  Vital signs: BP 116/82   Pulse 85   Ht 5' 6.5" (1.689 m)   Wt 157 lb (71.2 kg)   BMI 24.96 kg/m   Constitutional:   Pleasant Elderly Caucasian female appears to be in NAD, Well developed, Well nourished, alert and cooperative Respiratory: Respirations even and unlabored. Lungs clear to auscultation bilaterally.   No wheezes, crackles, or rhonchi.  Cardiovascular: Normal S1, S2. No MRG. Regular rate and rhythm. No peripheral edema, cyanosis or pallor.  Gastrointestinal:  Soft, mild distension, mild b/l lower abdominal ttp, No rebound or guarding. Normal bowel sounds. No appreciable masses or hepatomegaly. Psychiatric:  Demonstrates good judgement and reason without abnormal affect or behaviors.  MOST RECENT LABS AND IMAGING: CBC    Component Value Date/Time    WBC 6.2 05/06/2018 1626   RBC 4.23 05/06/2018 1626   HGB 13.6 05/06/2018 1626   HGB 12.3 10/16/2012 1306   HCT 39.8 05/06/2018 1626   PLT 187.0 05/06/2018 1626   MCV 94.0 05/06/2018 1626   MCH 30.2 11/01/2016 1628   MCHC 34.1 05/06/2018 1626   RDW 12.9 05/06/2018 1626   LYMPHSABS 2.5 05/06/2018 1626   MONOABS 0.4 05/06/2018 1626   EOSABS 0.2 05/06/2018 1626   BASOSABS 0.1 05/06/2018 1626    CMP     Component Value Date/Time   NA 141 05/06/2018 1626   K 4.3 05/06/2018 1626   K 4.0 10/16/2012 1306   CL 104 05/06/2018 1626   CO2 31 05/06/2018 1626   GLUCOSE 75 05/06/2018 1626   BUN 18 05/06/2018 1626   CREATININE 0.79 05/06/2018 1626   CALCIUM 9.4 05/06/2018 1626   PROT 6.8 05/06/2018 1626   ALBUMIN 4.0 05/06/2018 1626   AST 15 05/06/2018 1626   ALT 10 05/06/2018 1626   ALKPHOS 64 05/06/2018 1626   BILITOT 0.3 05/06/2018 1626   GFRNONAA 78 (L) 08/13/2014 1001   GFRAA >90 08/13/2014 1001    Assessment: 1.  Bilateral lower abdominal pain: Question relation to possible IBS? 2.  Change in bowel habits: With variation from diarrhea to constipation; question IBS vs other 3.  Epigastric pain: better after change and increase in PPI, patient still questions possible h.pylori 4.  Bloating  Plan: 1.  Decreased patient's Pantoprazole back to 40 mg daily, 30-60 minutes before breakfast.  She can continue Zantac 75 mg nightly. 2.  Increased IBgard to 1 tab 3 times daily.  Provide patient with samples and a coupon. 4.  Discussed with patient that we will reverse the charge of  her H. pylori fecal antigen.  I apologize for the miscommunication in regards to her PPI. 5.  Patient was told to increase fiber to at least 25-35 g/day with use of a fiber supplement such as Metamucil, Citrucel or Benefiber.  Advised her with a high-fiber diet handout. 6.  Continue to increase water intake of at least 6-8 eight ounce glasses of water per day. 7.  Recommend patient continue on probiotic.   Discussed Align. 8.  Patient would really like H. pylori testing.  Ordered H. pylori antibody today. 9.  Recommend a CT of the abdomen and pelvis with contrast for further evaluation, patient would like to wait on this until finding out about H.pylori. If this is negative she agrees to proceed. I did explain that h.pylori does not usually cause lower abdominal pain like she is describing. She still wishes to proceed as previously explained. 10.  Patient will follow with Dr. Fuller Plan at his next available appointment.  She has never seen him in clinic.  Ellouise Newer, PA-C [facility] 06/12/2018, 3:34 PM  Cc: Tower, Wynelle Fanny, MD

## 2018-06-12 NOTE — Telephone Encounter (Signed)
-----   Message from Levin Erp, [place] sent at 06/12/2018  4:10 PM EDT ----- Regarding: Please reverse charge for h.pylori Sherri told me to ask you to please call whoever resulted patient's H. pylori fecal antigen and have the charges reversed and sent to our clinic instead as it was our mistake patient was still on PPI when doing this testing.  Thank you so much. Ellouise Newer, PA-C

## 2018-06-12 NOTE — Telephone Encounter (Signed)
Spoke with Senaida Ores in the lab and she will have the charges reversed to our clinic and not the pt.

## 2018-06-13 ENCOUNTER — Encounter: Payer: Self-pay | Admitting: Physician Assistant

## 2018-06-13 ENCOUNTER — Other Ambulatory Visit: Payer: Self-pay

## 2018-06-13 LAB — H. PYLORI ANTIBODY, IGG: H Pylori IgG: POSITIVE — AB

## 2018-06-13 MED ORDER — METRONIDAZOLE 250 MG PO TABS: 250.0000 mg | ORAL_TABLET | Freq: Four times a day (QID) | ORAL | 0 refills | Status: AC

## 2018-06-13 MED ORDER — BISMUTH SUBSALICYLATE 262 MG PO CHEW: 524.0000 mg | CHEWABLE_TABLET | Freq: Four times a day (QID) | ORAL | 0 refills | Status: AC

## 2018-06-13 MED ORDER — DOXYCYCLINE HYCLATE 100 MG PO CAPS
100.0000 mg | ORAL_CAPSULE | Freq: Two times a day (BID) | ORAL | 0 refills | Status: AC
Start: 2018-06-13 — End: 2018-06-27

## 2018-06-16 NOTE — Progress Notes (Signed)
Reviewed and agree with initial management plan.  Chany Woolworth T. Krina Mraz, MD FACG 

## 2018-06-23 ENCOUNTER — Telehealth: Payer: Self-pay | Admitting: Physician Assistant

## 2018-06-23 NOTE — Telephone Encounter (Signed)
The patient has been notified of this information and all questions answered. The pt has been advised of the information and verbalized understanding.    

## 2018-06-23 NOTE — Telephone Encounter (Signed)
Yes, please, if she can tolerate, this is the best medicine for her at the moment.

## 2018-06-23 NOTE — Telephone Encounter (Signed)
Dizziness for about 45 min after taking flagyl.  Last dose taken at 10 am with no dizziness.  Do you want her to continue until she finishes the course?

## 2018-06-26 ENCOUNTER — Encounter: Payer: Self-pay | Admitting: Physician Assistant

## 2018-06-27 ENCOUNTER — Other Ambulatory Visit: Payer: Self-pay | Admitting: Family Medicine

## 2018-06-27 NOTE — Telephone Encounter (Signed)
Diazepam refill Last Refill:03/12/18 #90 Last OV: 03/12/18 PCP: Dr. Glori Bickers Pharmacy: [facility] [id number]

## 2018-06-27 NOTE — Telephone Encounter (Signed)
Copied from [place] [id number]. Topic: Quick Communication - Rx Refill/Question >> Jun 27, 2018  3:42 PM Selinda Flavin B, NT wrote: Medication: diazepam (VALIUM) 5 MG tablet  Has the patient contacted their pharmacy? Yes.   (Agent: If no, request that the patient contact the pharmacy for the refill.) (Agent: If yes, when and what did the pharmacy advise?)  Preferred Pharmacy (with phone number or street name): [facility] [place], [place]: Please be advised that RX refills may take up to 3 business days. We ask that you follow-up with your pharmacy.

## 2018-06-30 MED ORDER — DIAZEPAM 5 MG PO TABS: 5.0000 mg | ORAL_TABLET | Freq: Every evening | ORAL | 1 refills | Status: DC | PRN

## 2018-06-30 NOTE — Telephone Encounter (Signed)
Name of Medication: Valium 5 mg Name of Pharmacy: [facility] [address] [place] or Written Date and Quantity: # 53 on 03/12/18 Last Office Visit and Type: 03/12/18 annual Next Office Visit and Type: 03/30/19 annual

## 2018-08-18 ENCOUNTER — Other Ambulatory Visit (INDEPENDENT_AMBULATORY_CARE_PROVIDER_SITE_OTHER): Payer: Medicare Other

## 2018-08-18 ENCOUNTER — Encounter: Payer: Self-pay | Admitting: Gastroenterology

## 2018-08-18 ENCOUNTER — Ambulatory Visit: Payer: Medicare Other | Admitting: Gastroenterology

## 2018-08-18 VITALS — BP 116/84 | HR 70 | Ht 66.5 in | Wt 156.4 lb

## 2018-08-18 DIAGNOSIS — K219 Gastro-esophageal reflux disease without esophagitis: Secondary | ICD-10-CM | POA: Diagnosis not present

## 2018-08-18 DIAGNOSIS — R1013 Epigastric pain: Secondary | ICD-10-CM

## 2018-08-18 DIAGNOSIS — R1031 Right lower quadrant pain: Secondary | ICD-10-CM

## 2018-08-18 LAB — CREATININE, SERUM: CREATININE: 0.72 mg/dL (ref 0.40–1.20)

## 2018-08-18 LAB — BUN: BUN: 21 mg/dL (ref 6–23)

## 2018-08-18 NOTE — Progress Notes (Signed)
    History of Present Illness: This is an 84 year old female with right lower abdominal pain and intermittent epigastric pain.  She is accompanied by her daughter Carla Kelly.  Her lower abdominal pain and epigastric pain substantially improved with treatment of H pylori and use of pantoprazole.  Certain vitamins, calcium and coffee exacerbate her epigastric pain.  She describes the right lower quadrant pain as intermittent, unpredictable and not changed with meals or bowel movements.  IBgard is not helping her intermittent right lower quadrant pain.   Current Medications, Allergies, Past Medical History, Past Surgical History, Family History and Social History were reviewed in [employer] record.  Physical Exam: General: Well developed, well nourished, no acute distress Head: Normocephalic and atraumatic Eyes:  sclerae anicteric, EOMI Ears: Normal auditory acuity Mouth: No deformity or lesions Lungs: Clear throughout to auscultation Heart: Regular rate and rhythm; no murmurs, rubs or bruits Abdomen: Soft, mild focal right lower quadrant tenderness to deep palpation and non distended. No masses, hepatosplenomegaly or hernias noted. Normal Bowel sounds Rectal: Not done Musculoskeletal: Symmetrical with no gross deformities  Pulses:  Normal pulses noted Extremities: No clubbing, cyanosis, edema or deformities noted Neurological: Alert oriented x 4, grossly nonfocal Psychological:  Alert and cooperative. Normal mood and affect  Assessment and Recommendations:  1.  Persistent right lower quadrant pain not relieved by IBgard.  Appears to be a separate problem than her epigastric pain.  Schedule abdomen/pelvis CT.  2.  GERD and epigastric pain.  Continue standard antireflux measures and pantoprazole 40 mg po bid.  Avoid vitamins, coffee and calcium if they continue to exacerbate symptoms.  Suggested to try vitamins and calcium on a full stomach.  If symptoms persist recommend  EGD for further evaluation.

## 2018-08-18 NOTE — Patient Instructions (Signed)
Your provider has requested that you go to the basement level for lab work before leaving today. Press "B" on the elevator. The lab is located at the first door on the left as you exit the elevator.  You have been scheduled for a CT scan of the abdomen and pelvis at [facility] ([address]---this is in the same building as [facility]).   You are scheduled on 08/28/18 at 3:30pm. You should arrive 15 minutes prior to your appointment time for registration. Please follow the written instructions below on the day of your exam:  WARNING: IF YOU ARE ALLERGIC TO IODINE/X-RAY DYE, PLEASE NOTIFY RADIOLOGY IMMEDIATELY AT [phone]! YOU WILL BE GIVEN A 13 HOUR PREMEDICATION PREP.  1) Do not eat or drink anything after 11:30am (4 hours prior to your test) 2) You have been given 2 bottles of oral contrast to drink. The solution may taste better if refrigerated, but do NOT add ice or any other liquid to this solution. Shake well before drinking.    Drink 1 bottle of contrast @ 1:30pm (2 hours prior to your exam)  Drink 1 bottle of contrast @ 2:30pm (1 hour prior to your exam)  You may take any medications as prescribed with a small amount of water except for the following: Metformin, Glucophage, Glucovance, Avandamet, Riomet, Fortamet, Actoplus Met, Janumet, Glumetza or Metaglip. The above medications must be held the day of the exam AND 48 hours after the exam.  The purpose of you drinking the oral contrast is to aid in the visualization of your intestinal tract. The contrast solution may cause some diarrhea. Before your exam is started, you will be given a small amount of fluid to drink. Depending on your individual set of symptoms, you may also receive an intravenous injection of x-ray contrast/dye. Plan on being at [facility] for 30 minutes or longer, depending on the type of exam you are having performed.  This test typically takes 30-45 minutes to complete.  If you  have any questions regarding your exam or if you need to reschedule, you may call the CT department at [phone] between the hours of 8:00 am and 5:00 pm, Monday-Friday.  ________________________________________________________________________  Normal BMI (Body Mass Index- based on height and weight) is between 23 and 30. Your BMI today is Body mass index is 24.86 kg/m. Marland Kitchen Please consider follow up  regarding your BMI with your Primary Care Provider.  Thank you for choosing me and [facility].  Pricilla Riffle. Dagoberto Ligas., MD., [facility] Regal

## 2018-08-19 ENCOUNTER — Encounter: Payer: Self-pay | Admitting: Family Medicine

## 2018-08-28 ENCOUNTER — Inpatient Hospital Stay: Admission: RE | Admit: 2018-08-28 | Payer: Medicare Other | Source: Ambulatory Visit

## 2018-09-01 ENCOUNTER — Ambulatory Visit (INDEPENDENT_AMBULATORY_CARE_PROVIDER_SITE_OTHER)
Admission: RE | Admit: 2018-09-01 | Discharge: 2018-09-01 | Disposition: A | Discharge status: Home / Self Care | Payer: Medicare Other | Source: Ambulatory Visit | Attending: Gastroenterology | Admitting: Gastroenterology

## 2018-09-01 DIAGNOSIS — R1031 Right lower quadrant pain: Secondary | ICD-10-CM | POA: Diagnosis not present

## 2018-09-01 DIAGNOSIS — N83291 Other ovarian cyst, right side: Secondary | ICD-10-CM | POA: Diagnosis not present

## 2018-09-01 MED ORDER — IOPAMIDOL (ISOVUE-300) INJECTION 61%
100.0000 mL | Freq: Once | INTRAVENOUS | Status: AC | PRN
  Administered 2018-09-01: 100 mL via INTRAVENOUS

## 2018-09-04 ENCOUNTER — Ambulatory Visit (INDEPENDENT_AMBULATORY_CARE_PROVIDER_SITE_OTHER): Payer: Medicare Other

## 2018-09-04 DIAGNOSIS — Z23 Encounter for immunization: Secondary | ICD-10-CM | POA: Diagnosis not present

## 2018-09-12 DIAGNOSIS — N83201 Unspecified ovarian cyst, right side: Secondary | ICD-10-CM | POA: Diagnosis not present

## 2018-12-23 DIAGNOSIS — R1903 Right lower quadrant abdominal swelling, mass and lump: Secondary | ICD-10-CM | POA: Diagnosis not present

## 2019-01-08 ENCOUNTER — Other Ambulatory Visit: Payer: Self-pay | Admitting: Family Medicine

## 2019-01-08 NOTE — Telephone Encounter (Signed)
Name of Medication: Valium 5 mg Name of Pharmacy: [facility] [address] [place] Last Fill or Written Date and Quantity: # 90 with 1 refill on 06/30/18 Last Office Visit and Type: 05/06/18 GI issues Next Office Visit and Type: 03/30/19 CPE

## 2019-03-19 ENCOUNTER — Other Ambulatory Visit: Payer: Self-pay | Admitting: Family Medicine

## 2019-03-23 ENCOUNTER — Ambulatory Visit: Payer: Medicare Other

## 2019-03-30 ENCOUNTER — Ambulatory Visit: Payer: Medicare Other | Admitting: Family Medicine

## 2019-06-02 ENCOUNTER — Telehealth: Payer: Self-pay | Admitting: Family Medicine

## 2019-06-02 DIAGNOSIS — Z0000 Encounter for general adult medical examination without abnormal findings: Secondary | ICD-10-CM

## 2019-06-02 DIAGNOSIS — E039 Hypothyroidism, unspecified: Secondary | ICD-10-CM

## 2019-06-02 DIAGNOSIS — E559 Vitamin D deficiency, unspecified: Secondary | ICD-10-CM

## 2019-06-02 DIAGNOSIS — M810 Age-related osteoporosis without current pathological fracture: Secondary | ICD-10-CM

## 2019-06-02 DIAGNOSIS — E538 Deficiency of other specified B group vitamins: Secondary | ICD-10-CM

## 2019-06-02 DIAGNOSIS — E7800 Pure hypercholesterolemia, unspecified: Secondary | ICD-10-CM

## 2019-06-02 NOTE — Telephone Encounter (Signed)
-----   Message from Ellamae Sia sent at 06/01/2019 12:44 PM EDT ----- Regarding: Lab orders for Thursday, 7.9.20 Patient is scheduled for CPX labs, please order future labs, Thanks , Karna Christmas

## 2019-06-04 ENCOUNTER — Other Ambulatory Visit: Payer: Self-pay

## 2019-06-04 ENCOUNTER — Other Ambulatory Visit (INDEPENDENT_AMBULATORY_CARE_PROVIDER_SITE_OTHER): Payer: Medicare Other

## 2019-06-04 DIAGNOSIS — E039 Hypothyroidism, unspecified: Secondary | ICD-10-CM | POA: Diagnosis not present

## 2019-06-04 DIAGNOSIS — E538 Deficiency of other specified B group vitamins: Secondary | ICD-10-CM

## 2019-06-04 DIAGNOSIS — E7800 Pure hypercholesterolemia, unspecified: Secondary | ICD-10-CM | POA: Diagnosis not present

## 2019-06-04 DIAGNOSIS — E559 Vitamin D deficiency, unspecified: Secondary | ICD-10-CM

## 2019-06-04 DIAGNOSIS — Z0000 Encounter for general adult medical examination without abnormal findings: Secondary | ICD-10-CM | POA: Diagnosis not present

## 2019-06-04 LAB — COMPREHENSIVE METABOLIC PANEL
ALT: 11 U/L (ref 0–35)
AST: 16 U/L (ref 0–37)
Albumin: 4 g/dL (ref 3.5–5.2)
Alkaline Phosphatase: 85 U/L (ref 39–117)
BUN: 15 mg/dL (ref 6–23)
CO2: 33 mEq/L — ABNORMAL HIGH (ref 19–32)
Calcium: 9.1 mg/dL (ref 8.4–10.5)
Chloride: 106 mEq/L (ref 96–112)
Creatinine, Ser: 0.8 mg/dL (ref 0.40–1.20)
GFR: 68.27 mL/min (ref >60.00)
Glucose, Bld: 79 mg/dL (ref 70–99)
Potassium: 4.1 mEq/L (ref 3.5–5.1)
Sodium: 144 mEq/L (ref 135–145)
Total Bilirubin: 0.4 mg/dL (ref 0.2–1.2)
Total Protein: 6.4 g/dL (ref 6.0–8.3)

## 2019-06-04 LAB — CBC WITH DIFFERENTIAL/PLATELET
Basophils Absolute: 0 10*3/uL (ref 0.0–0.1)
Basophils Relative: 0.7 % (ref 0.0–3.0)
Eosinophils Absolute: 0.5 10*3/uL (ref 0.0–0.7)
Eosinophils Relative: 7.7 % — ABNORMAL HIGH (ref 0.0–5.0)
HCT: 39.6 % (ref 36.0–46.0)
Hemoglobin: 13 g/dL (ref 12.0–15.0)
Lymphocytes Relative: 53 % — ABNORMAL HIGH (ref 12.0–46.0)
Lymphs Abs: 3.3 10*3/uL (ref 0.7–4.0)
MCHC: 32.8 g/dL (ref 30.0–36.0)
MCV: 95.3 fl (ref 78.0–100.0)
Monocytes Absolute: 0.4 10*3/uL (ref 0.1–1.0)
Monocytes Relative: 6.6 % (ref 3.0–12.0)
Neutro Abs: 2 10*3/uL (ref 1.4–7.7)
Neutrophils Relative %: 32 % — ABNORMAL LOW (ref 43.0–77.0)
Platelets: 191 10*3/uL (ref 150.0–400.0)
RBC: 4.16 Mil/uL (ref 3.87–5.11)
RDW: 13.3 % (ref 11.5–15.5)
WBC: 6.3 10*3/uL (ref 4.0–10.5)

## 2019-06-04 LAB — LIPID PANEL
Cholesterol: 190 mg/dL (ref 0–200)
HDL: 53.7 mg/dL (ref >39.00)
LDL Cholesterol: 103 mg/dL — ABNORMAL HIGH (ref 0–99)
NonHDL: 136.46
Total CHOL/HDL Ratio: 4
Triglycerides: 166 mg/dL — ABNORMAL HIGH (ref 0.0–149.0)
VLDL: 33.2 mg/dL (ref 0.0–40.0)

## 2019-06-04 LAB — TSH: TSH: 0.7 u[IU]/mL (ref 0.35–4.50)

## 2019-06-04 LAB — VITAMIN B12: Vitamin B-12: 518 pg/mL (ref 211–911)

## 2019-06-04 LAB — VITAMIN D 25 HYDROXY (VIT D DEFICIENCY, FRACTURES): VITD: 83.17 ng/mL (ref 30.00–100.00)

## 2019-06-11 ENCOUNTER — Ambulatory Visit (INDEPENDENT_AMBULATORY_CARE_PROVIDER_SITE_OTHER): Payer: Medicare Other | Admitting: Family Medicine

## 2019-06-11 ENCOUNTER — Encounter: Payer: Self-pay | Admitting: Family Medicine

## 2019-06-11 VITALS — BP 117/62 | HR 98 | Ht 66.5 in | Wt 154.0 lb

## 2019-06-11 DIAGNOSIS — Z0000 Encounter for general adult medical examination without abnormal findings: Secondary | ICD-10-CM | POA: Diagnosis not present

## 2019-06-11 DIAGNOSIS — E538 Deficiency of other specified B group vitamins: Secondary | ICD-10-CM

## 2019-06-11 DIAGNOSIS — M810 Age-related osteoporosis without current pathological fracture: Secondary | ICD-10-CM | POA: Diagnosis not present

## 2019-06-11 DIAGNOSIS — E039 Hypothyroidism, unspecified: Secondary | ICD-10-CM

## 2019-06-11 DIAGNOSIS — L409 Psoriasis, unspecified: Secondary | ICD-10-CM

## 2019-06-11 DIAGNOSIS — E559 Vitamin D deficiency, unspecified: Secondary | ICD-10-CM

## 2019-06-11 DIAGNOSIS — E7800 Pure hypercholesterolemia, unspecified: Secondary | ICD-10-CM

## 2019-06-11 MED ORDER — MOMETASONE FUROATE 0.1 % EX CREA: 1.0000 "application " | TOPICAL_CREAM | Freq: Every day | CUTANEOUS | 1 refills | Status: DC

## 2019-06-11 NOTE — Assessment & Plan Note (Signed)
Reviewed health habits including diet and exercise and skin cancer prevention Reviewed appropriate screening tests for age  Also reviewed health mt list, fam hx and immunization status , as well as social and family history    See HPI Labs reviewed  Disc fall prevention  Holding off on dexa and mammogram due to the pandemic currently  utd colon screening and vaccines May be interested in shingrix later if covered  Enc on line and phone socialization during the pandemic

## 2019-06-11 NOTE — Assessment & Plan Note (Signed)
dexa 6/18 stable osteoporosis  No fractures/one fall  disc fall prevention Taking supplements/nl D level  Not interested in tx  Pt stays active

## 2019-06-11 NOTE — Assessment & Plan Note (Signed)
Vitamin D level is therapeutic with current supplementation Disc importance of this to bone and overall health  

## 2019-06-11 NOTE — Assessment & Plan Note (Signed)
Refilled mometasone prev px by derm for prn use

## 2019-06-11 NOTE — Assessment & Plan Note (Signed)
Hypothyroidism  Pt has no clinical changes No change in energy level/ hair or skin/ edema and no tremor Lab Results  Component Value Date   TSH 0.70 06/04/2019

## 2019-06-11 NOTE — Assessment & Plan Note (Signed)
Lab Results  Component Value Date   [id number] 06/04/2019   Taking oral supplement every other day

## 2019-06-11 NOTE — Assessment & Plan Note (Signed)
Disc goals for lipids and reasons to control them Rev last labs with pt Rev low sat fat diet in detail  Improved LDL Fish oil and good diet

## 2019-06-11 NOTE — Progress Notes (Signed)
Virtual Visit via Video Note  I connected with Carla Kelly on 06/11/19 at  9:30 AM EDT by a video enabled telemedicine application and verified that I am speaking with the correct person using two identifiers.  Location: Patient: home Provider: office    I discussed the limitations of evaluation and management by telemedicine and the availability of in person appointments. The patient expressed understanding and agreed to proceed.  History of Present Illness: Here for health maintenance exam and to review chronic medical problems   Doing fairly well overall   Has stayed at home during the pandemic  Does choir meetings virtually as well as church Being careful  Doing a lot of outdoor work (one fall/had her cell phone to get help) Sprained the ankle she previously broke  She stayed off of it and used her ankle brace for 2 wk (also ice) Still using her cane and elevating  No longer swelling and pain is improved   Psoriasis is acting up  Mometasone -needs refill  Goes off and on   Weight 154 lb  she mt her weight  Eats very well  Active- doing more as her ankle sprain improves   Wt Readings from Last 3 Encounters:  06/11/19 154 lb (69.9 kg)  08/18/18 156 lb 6 oz (70.9 kg)  06/12/18 157 lb (71.2 kg)   24.48 kg/m   BP has been good at home  117/62 today   Temp 97.6  Takes [facility] brand of align-helps her GI issues  Saw Dr Judd Gaudier Dr Helane Rima for a small cystic structure in area of previous R ovary Watching - ? Scar tissue  No symptoms    Mammogram 6/19-has not had it (going to hold off in light of the pandemic) Self breast exam-no lumps  Daughter had breast cancer   Flu shot 10/19 Td 1/14 PNA vaccines completed  Has had shingles Vaccine status - may be interested later in shingrix   dexa 6/18  Stable with osteoporosis  Falls -fell while gardening on uneven ground and twisted her L ankle (forgot her cane) Fractures-none in the past 12 months  Supplements  -takes vit D and ca  Vit D level is 83.1  Colonoscopy 7/11 with 5 y recall (her choice) for polyps  Dr Fuller Plan did not want her to get another   Hypothyroidism  Pt has no clinical changes No change in energy level/ hair or skin/ edema and no tremor Lab Results  Component Value Date   TSH 0.70 06/04/2019     B12 def Lab Results  Component Value Date   VITAMINB12 518 06/04/2019  takes every other day (was high a year ago)     Hyperlipidemia Lab Results  Component Value Date   CHOL 190 06/04/2019   CHOL 201 (H) 03/06/2018   CHOL 184 02/21/2017   Lab Results  Component Value Date   HDL 53.70 06/04/2019   HDL 67.80 03/06/2018   HDL 53.50 02/21/2017   Lab Results  Component Value Date   LDLCALC 103 (H) 06/04/2019   LDLCALC 118 (H) 03/06/2018   LDLCALC 106 (H) 02/21/2017   Lab Results  Component Value Date   TRIG 166.0 (H) 06/04/2019   TRIG 74.0 03/06/2018   TRIG 125.0 02/21/2017   Lab Results  Component Value Date   CHOLHDL 4 06/04/2019   CHOLHDL 3 03/06/2018   CHOLHDL 3 02/21/2017   Lab Results  Component Value Date   LDLDIRECT 149.5 12/08/2013   LDLDIRECT 111.0  12/04/2012   LDLDIRECT 128.3 09/17/2011   Diet controlled  She watches her diet  Fish oil daily   Other labs Lab Results  Component Value Date   CREATININE 0.80 06/04/2019   BUN 15 06/04/2019   NA 144 06/04/2019   K 4.1 06/04/2019   CL 106 06/04/2019   CO2 33 (H) 06/04/2019   Lab Results  Component Value Date   ALT 11 06/04/2019   AST 16 06/04/2019   ALKPHOS 85 06/04/2019   BILITOT 0.4 06/04/2019   Lab Results  Component Value Date   WBC 6.3 06/04/2019   HGB 13.0 06/04/2019   HCT 39.6 06/04/2019   MCV 95.3 06/04/2019   PLT 191.0 06/04/2019  glucose 79    Patient Active Problem List   Diagnosis Date Noted  . Epigastric pain 05/06/2018  . Hammer toe of right foot 03/12/2018  . Screening mammogram, encounter for 03/04/2017  . Flank pain 08/29/2016  . Routine general medical  examination at a health care facility 02/12/2016  . Estrogen deficiency 02/15/2015  . Colon cancer screening 02/15/2015  . Arthritis of right knee 08/13/2014  . Status post total right knee replacement 08/13/2014  . Transient global amnesia 02/20/2014  . Encounter for Medicare annual wellness exam 12/07/2013  . Psoriasis 02/16/2013  . Other screening mammogram 08/19/2012  . Pedal edema 03/11/2012  . B12 deficiency 09/05/2009  . Vitamin D deficiency 11/08/2008  . Hyperlipidemia 08/25/2008  . Osteoporosis 08/25/2008  . History of colonic polyps 08/25/2008  . HERPES ZOSTER, HX OF 06/03/2008  . Hypothyroidism 08/04/2007  . HYPOGLYCEMIA 08/04/2007  . ASTHMA 08/04/2007  . IRRITABLE BOWEL SYNDROME 08/04/2007  . VAGINITIS, ATROPHIC 08/04/2007  . Osteoarthrosis, unspecified whether generalized or localized, unspecified site 08/04/2007  . FIBROMYALGIA 08/04/2007  . URINARY INCONTINENCE 08/04/2007   Past Medical History:  Diagnosis Date  . Adenomatous colon polyp   . Asthma   . Depression   . Disorder of bone and cartilage, unspecified   . Embolism and thrombosis of unspecified site   . Fibromyalgia   . History of herpes zoster   . HLD (hyperlipidemia)   . Hypoglycemia   . Hypothyroidism   . IBS (irritable bowel syndrome)   . Mitral valve disorder    "leaking mitral valve-very mild"  . Orbital floor fracture ([place]) 10/16   with a fall   . Osteoarthritis   . Osteoarthrosis, unspecified whether generalized or localized, unspecified site   . Other B-complex deficiencies   . Psoriasis   . Stroke ([place])   . Swelling of extremity, right    right leg  . 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 Unspecified urinary incontinence   . Unspecified vitamin D deficiency    Past Surgical History:  Procedure Laterality Date  . ABDOMINAL HYSTERECTOMY  1970   fibroids; endometriosis  . APPENDECTOMY    . bladder tack  5/05  . BREAST BIOPSY Right 1995  . BREAST CYST ASPIRATION Left  03/17/2015  . CATARACT EXTRACTION Right 12/13   Dr George Ina  . CATARACT EXTRACTION W/PHACO Left 05/10/2015   Procedure: CATARACT EXTRACTION PHACO AND INTRAOCULAR LENS PLACEMENT (IOC);  Surgeon: Birder Robson, MD;  Location: [facility] OR[facility];  Service: Ophthalmology;  Laterality: Left;  [place] 00:59 AP% 26.7 CDE 15.85 fluid pack lot#[id number]  . CERVICAL DISC SURGERY     x 2  . CHOLECYSTECTOMY  2004  . COLONOSCOPY  2/02   polyps, BE-polyps  . EYE SURGERY     right eye  . MOUTH SURGERY  lower teeth have 4 studs to keep in place  . TONSILLECTOMY    . TOTAL KNEE ARTHROPLASTY Right 08/13/2014   Procedure: RIGHT TOTAL KNEE ARTHROPLASTY;  Surgeon: Mcarthur Rossetti, MD;  Location: [facility];  Service: Orthopedics;  Laterality: Right;  . TYMPANOPLASTY    . US TRANSVAGINAL PELVIC MODIFIED  10/02   small left ovarian cyst   Social History   Tobacco Use  . Smoking status: Never Smoker  . Smokeless tobacco: Never Used  Substance Use Topics  . Alcohol use: No    Comment: "is allergic"  . Drug use: No   Family History  Problem Relation Age of Onset  . Heart attack Mother   . Stroke Mother        several  . Lupus Brother   . Other Sister        benign brain tumor  . Asthma Daughter   . Breast cancer Daughter   . Other Brother        heart problems  . Colon cancer Unknown        1st cousin   Allergies  Allergen Reactions  . Alcohol-Sulfur [Sulfur] Shortness Of Breath and Swelling    Alcohol po swells throat  . Ergocalciferol Other (See Comments)    REACTION: GI side effects  . Morphine Nausea And Vomiting  . Tolterodine Tartrate Other (See Comments)    REACTION: ? reaction  . Alcohol Other (See Comments) and Swelling  . Myrbetriq [Mirabegron] Other (See Comments)    GI Upset  . Nortriptyline Hcl Other (See Comments)    unknown   Current Outpatient Medications on File Prior to Visit  Medication Sig Dispense Refill  . albuterol (PROVENTIL) 4 MG tablet Take 1 tablet (4 mg  total) by mouth every 4 (four) hours as needed. (Patient taking differently: Take 4 mg by mouth as needed. ) 30 tablet 11  . Apoaequorin (PREVAGEN) 10 MG CAPS Take 1 capsule by mouth daily.    Marland Kitchen aspirin 325 MG tablet Take 325 mg by mouth daily.    . Calcium Carbonate-Vit D-Min (CALTRATE 600+D PLUS MINERALS) 600-800 MG-UNIT TABS Take 1 capsule by mouth daily.    . Cholecalciferol (VITAMIN D3 GUMMIES PO) Take 1 tablet by mouth daily.    . cyanocobalamin 1000 MCG tablet Take 1,000 mcg by mouth every other day.    . diazepam (VALIUM) 5 MG tablet TAKE 1 TABLET BY MOUTH AT BEDTIME AS NEEDED FOR ANXIETY. TAKE 1 TABLET THREE TIMES DAILY AS NEEDED 90 tablet 1  . furosemide (LASIX) 20 MG tablet Take 10 mg by mouth every other day.    . Omega-3 Fatty Acids (FISH OIL) 1000 MG CAPS Take 1 tablet by mouth daily.    Marland Kitchen OVER THE COUNTER MEDICATION [facility]  Fiber + B Vitamins-chew two daily    . Prenatal Vit-Fe Fumarate-FA (PRENATAL MULTIVITAMIN/IRON PO) Take 1 tablet by mouth daily.    . Probiotic Product (ALIGN) 4 MG CAPS Take by mouth every other day.    Marland Kitchen SYNTHROID 88 MCG tablet TAKE 1 TABLET BY MOUTH EVERY MORNING ON AN EMPTY STOMACH 90 tablet 3   No current facility-administered medications on file prior to visit.     Review of Systems  Constitutional: Negative for chills, fever, malaise/fatigue and weight loss.  HENT: Negative for sore throat.   Eyes: Negative for blurred vision, discharge and redness.  Respiratory: Negative for cough and shortness of breath.   Cardiovascular: Negative for chest pain and palpitations.  Gastrointestinal:  Negative for abdominal pain, blood in stool and melena.       Bowels are more regular   Musculoskeletal: Negative for myalgias and neck pain.       L ankle sprain-getting better  Skin: Positive for rash.       Psoriasis   Neurological: Negative for dizziness and headaches.  Endo/Heme/Allergies: Negative for polydipsia.  Psychiatric/Behavioral: Negative for depression  and memory loss.    Observations/Objective: Patient appears well, in no distress Weight is baseline (normal) No facial swelling or asymmetry Normal voice-not hoarse and no slurred speech No obvious tremor or mobility impairment Moving neck and UEs normally Able to hear the call well  No cough or shortness of breath during interview  Talkative and mentally sharp with no cognitive changes No skin changes on face or neck , no rash or pallor Affect is normal    Assessment and Plan: Problem List Items Addressed This Visit      Endocrine   Hypothyroidism    Hypothyroidism  Pt has no clinical changes No change in energy level/ hair or skin/ edema and no tremor Lab Results  Component Value Date   TSH 0.70 06/04/2019            Musculoskeletal and Integument   Osteoporosis    dexa 6/18 stable osteoporosis  No fractures/one fall  disc fall prevention Taking supplements/nl D level  Not interested in tx  Pt stays active       Relevant Medications   Calcium Carbonate-Vit D-Min (CALTRATE 600+D PLUS MINERALS) 600-800 MG-UNIT TABS   Cholecalciferol (VITAMIN D3 GUMMIES PO)   Psoriasis    Refilled mometasone prev px by derm for prn use         Other   B12 deficiency    Lab Results  Component Value Date   VITAMINB12 518 06/04/2019   Taking oral supplement every other day       Vitamin D deficiency    Vitamin D level is therapeutic with current supplementation Disc importance of this to bone and overall health       Hyperlipidemia    Disc goals for lipids and reasons to control them Rev last labs with pt Rev low sat fat diet in detail  Improved LDL Fish oil and good diet       Routine general medical examination at a health care facility - Primary    Reviewed health habits including diet and exercise and skin cancer prevention Reviewed appropriate screening tests for age  Also reviewed health mt list, fam hx and immunization status , as well as social and family  history    See HPI Labs reviewed  Disc fall prevention  Holding off on dexa and mammogram due to the pandemic currently  utd colon screening and vaccines May be interested in shingrix later if covered  Enc on line and phone socialization during the pandemic           Follow Up Instructions: No change in thyroid medicine I will send in mometasone for your psoriasis  Stay active Use your cane/be careful not to fall   Get your mammogram when you feel safe going out  We will hold off on dexa until the pandemic is slowed or over  Continue wearing a mask   Keep socializing on the phone and on line   Get your flu shot in the fall (call or email [place] in sept)    I discussed the assessment and treatment plan with the patient.  The patient was provided an opportunity to ask questions and all were answered. The patient agreed with the plan and demonstrated an understanding of the instructions.   The patient was advised to call back or seek an in-person evaluation if the symptoms worsen or if the condition fails to improve as anticipated.     Loura Pardon, MD

## 2019-06-11 NOTE — Patient Instructions (Addendum)
No change in thyroid medicine I will send in mometasone for your psoriasis  Stay active Use your cane/be careful not to fall   Get your mammogram when you feel safe going out  We will hold off on dexa until the pandemic is slowed or over  Continue wearing a mask   Keep socializing on the phone and on line   Get your flu shot in the fall (call or email [place] in sept)

## 2019-07-21 ENCOUNTER — Ambulatory Visit (INDEPENDENT_AMBULATORY_CARE_PROVIDER_SITE_OTHER): Payer: Medicare Other

## 2019-07-21 DIAGNOSIS — Z23 Encounter for immunization: Secondary | ICD-10-CM

## 2019-07-22 ENCOUNTER — Encounter: Payer: Self-pay | Admitting: Family Medicine

## 2019-07-22 NOTE — Telephone Encounter (Signed)
See mychart message, pt did receive the regular dose of the flu vaccine

## 2019-09-08 ENCOUNTER — Other Ambulatory Visit: Payer: Self-pay | Admitting: Family Medicine

## 2019-09-08 NOTE — Telephone Encounter (Signed)
Name of [place] 5 mg Name of Pharmacy:[facility] [address] [place] or Written Date and Quantity:# 41 with 1 refill on 01/08/19 Last Office Visit and Type:CPE on 06/11/19 (Doxy) Next Office Visit and Type: none scheduled

## 2019-09-28 ENCOUNTER — Other Ambulatory Visit: Payer: Self-pay | Admitting: Family Medicine

## 2019-10-30 ENCOUNTER — Other Ambulatory Visit: Payer: Self-pay | Admitting: Family Medicine

## 2019-10-30 NOTE — Telephone Encounter (Signed)
Pt said she was advise to take 20 mg daily. Pt said she has been cutting the 40mg  tabs in 1/2, pt is requesting Dr. Glori Bickers to refill the 40 mg dose still because she cuts them in half and she only takes a 1/2 pill once daily of the. I asked pt if she wanted me to have Dr. Glori Bickers send in the 20 mg tabs so she wont have to break them in half and she said "no" she wants to keep getting #30 tabs of the 40 mg tabs because it's "economically smarter" to get 40 mg tabs and cut them in half

## 2019-10-30 NOTE — Telephone Encounter (Signed)
Med list says 20 mg and Rx is for 40 mg, please advise

## 2019-10-30 NOTE — Telephone Encounter (Signed)
Pt said she misunderstood me and she is taking 1/2 tab BID and would like to Rx to say that. Rx sent to pharmacy with that directions

## 2019-10-30 NOTE — Telephone Encounter (Signed)
If she is really taking 1/2 of the pill and the px says she takes the whole one- I can get audited for insurance fraud.- can't do that  Is she ok with the 20s?  Sorry about that

## 2019-10-30 NOTE — Telephone Encounter (Signed)
Please call pt and confirm what she is taking Thanks

## 2019-11-06 ENCOUNTER — Encounter: Payer: Self-pay | Admitting: Family Medicine

## 2019-11-11 ENCOUNTER — Telehealth: Payer: Self-pay | Admitting: Family Medicine

## 2019-11-11 DIAGNOSIS — E039 Hypothyroidism, unspecified: Secondary | ICD-10-CM

## 2019-11-11 NOTE — Telephone Encounter (Signed)
-----   Message from Ellamae Sia sent at 11/10/2019  3:16 PM EST ----- Regarding: Lab orders for Friday, 12.18.20 Lab orders, thyroid??

## 2019-11-13 ENCOUNTER — Other Ambulatory Visit: Payer: Self-pay

## 2019-11-13 ENCOUNTER — Other Ambulatory Visit (INDEPENDENT_AMBULATORY_CARE_PROVIDER_SITE_OTHER): Payer: Medicare Other

## 2019-11-13 DIAGNOSIS — E039 Hypothyroidism, unspecified: Secondary | ICD-10-CM

## 2019-11-13 LAB — TSH: TSH: 2.28 u[IU]/mL (ref 0.35–4.50)

## 2019-11-16 ENCOUNTER — Encounter: Payer: Self-pay | Admitting: *Deleted

## 2019-12-07 ENCOUNTER — Encounter: Payer: Self-pay | Admitting: Family Medicine

## 2019-12-07 ENCOUNTER — Telehealth: Payer: Self-pay | Admitting: Family Medicine

## 2019-12-07 NOTE — Telephone Encounter (Signed)
Pt notified form ready for pick up 

## 2019-12-07 NOTE — Telephone Encounter (Signed)
Done and in IN box 

## 2019-12-07 NOTE — Telephone Encounter (Signed)
In your inbox.

## 2019-12-07 NOTE — Telephone Encounter (Signed)
Pt dropped off a handicap parking placard form to be signed. Placed in rx tower.

## 2019-12-24 ENCOUNTER — Encounter: Payer: Self-pay | Admitting: Family Medicine

## 2019-12-31 ENCOUNTER — Encounter: Payer: Self-pay | Admitting: Family Medicine

## 2020-02-04 ENCOUNTER — Other Ambulatory Visit: Payer: Self-pay | Admitting: *Deleted

## 2020-02-04 MED ORDER — DIAZEPAM 5 MG PO TABS: ORAL_TABLET | ORAL | 0 refills | Status: DC

## 2020-02-04 NOTE — Telephone Encounter (Signed)
Refill appropriate, but given age and that she is not my patient will do #30 tablet and defer additional refills to PCP

## 2020-02-04 NOTE — Telephone Encounter (Signed)
Name of [place] 5 mg Name of Pharmacy:[facility] [address] [place] or Written Date and Quantity:# 90with 0 refillon 09/08/19 Last Office Visit and Type:CPE on 06/11/19 (Doxy) Next Office Visit and Type: none scheduled   PCP not in office, will route to PCP and a provider in the office

## 2020-03-30 ENCOUNTER — Other Ambulatory Visit: Payer: Self-pay | Admitting: *Deleted

## 2020-03-30 MED ORDER — SYNTHROID 88 MCG PO TABS: 88.0000 ug | ORAL_TABLET | Freq: Every day | ORAL | 2 refills | Status: DC

## 2020-04-14 ENCOUNTER — Ambulatory Visit: Payer: Medicare Other | Admitting: Family Medicine

## 2020-04-14 ENCOUNTER — Other Ambulatory Visit: Payer: Self-pay

## 2020-04-14 ENCOUNTER — Encounter: Payer: Self-pay | Admitting: Family Medicine

## 2020-04-14 VITALS — BP 146/80 | HR 104 | Temp 96.2°F | Ht 66.5 in | Wt 161.3 lb

## 2020-04-14 DIAGNOSIS — L409 Psoriasis, unspecified: Secondary | ICD-10-CM

## 2020-04-14 MED ORDER — FLUOCINOLONE ACETONIDE SCALP 0.01 % EX OIL: TOPICAL_OIL | CUTANEOUS | 1 refills | Status: DC

## 2020-04-14 NOTE — Patient Instructions (Addendum)
Mometasone is a stronger steroid cream than you can get over the counter  You can use it on non hairy areas  Also derma smoothe oil- may be easier to use on scalp and areas with hair  (I sent it to the pharmacy)   Let us know if you want a dermatology referral

## 2020-04-14 NOTE — Progress Notes (Signed)
Subjective:    Patient ID: Carla Kelly, female    DOB: 05/20/1933, 86 y.o.   MRN: [id number]  This visit occurred during the SARS-CoV-2 public health emergency.  Safety protocols were in place, including screening questions prior to the visit, additional usage of staff PPE, and extensive cleaning of exam room while observing appropriate contact time as indicated for disinfecting solutions.    HPI Pt presents with c/o psoriasis   Wt Readings from Last 3 Encounters:  04/14/20 161 lb 5 oz (73.2 kg)  06/11/19 154 lb (69.9 kg)  08/18/18 156 lb 6 oz (70.9 kg)   25.65 kg/m   ? Of swollen LN  1-2 weeks ago - noted raised areas in L posterior neck at base of scalp  Psoriasis (just above it) is itchy    Her psoriasis was well controlled until after 2nd covid shot then spread to more areas    She had her pfizer covid vaccines  Had some facial parethesia after 2nd dose followed by some dizziness  This is improved  She plans never to get a booster   Patient Active Problem List   Diagnosis Date Noted  . Epigastric pain 05/06/2018  . Hammer toe of right foot 03/12/2018  . Screening mammogram, encounter for 03/04/2017  . Flank pain 08/29/2016  . Routine general medical examination at a health care facility 02/12/2016  . Estrogen deficiency 02/15/2015  . Colon cancer screening 02/15/2015  . Arthritis of right knee 08/13/2014  . Status post total right knee replacement 08/13/2014  . Transient global amnesia 02/20/2014  . Encounter for Medicare annual wellness exam 12/07/2013  . Psoriasis 02/16/2013  . Other screening mammogram 08/19/2012  . Pedal edema 03/11/2012  . B12 deficiency 09/05/2009  . Vitamin D deficiency 11/08/2008  . Hyperlipidemia 08/25/2008  . Osteoporosis 08/25/2008  . History of colonic polyps 08/25/2008  . HERPES ZOSTER, HX OF 06/03/2008  . Hypothyroidism 08/04/2007  . HYPOGLYCEMIA 08/04/2007  . ASTHMA 08/04/2007  . IRRITABLE BOWEL SYNDROME 08/04/2007  .  VAGINITIS, ATROPHIC 08/04/2007  . Osteoarthrosis, unspecified whether generalized or localized, unspecified site 08/04/2007  . FIBROMYALGIA 08/04/2007  . URINARY INCONTINENCE 08/04/2007   Past Medical History:  Diagnosis Date  . Adenomatous colon polyp   . Asthma   . Depression   . Disorder of bone and cartilage, unspecified   . Embolism and thrombosis of unspecified site   . Fibromyalgia   . History of herpes zoster   . HLD (hyperlipidemia)   . Hypoglycemia   . Hypothyroidism   . IBS (irritable bowel syndrome)   . Mitral valve disorder    "leaking mitral valve-very mild"  . Orbital floor fracture ([place]) 10/16   with a fall   . Osteoarthritis   . Osteoarthrosis, unspecified whether generalized or localized, unspecified site   . Other B-complex deficiencies   . Psoriasis   . Stroke ([place])   . Swelling of extremity, right    right leg  . 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 Unspecified urinary incontinence   . Unspecified vitamin D deficiency    Past Surgical History:  Procedure Laterality Date  . ABDOMINAL HYSTERECTOMY  1970   fibroids; endometriosis  . APPENDECTOMY    . bladder tack  5/05  . BREAST BIOPSY Right 1995  . BREAST CYST ASPIRATION Left 03/17/2015  . CATARACT EXTRACTION Right 12/13   Dr George Ina  . CATARACT EXTRACTION W/PHACO Left 05/10/2015   Procedure: CATARACT EXTRACTION PHACO AND INTRAOCULAR LENS PLACEMENT (IOC);  Surgeon: Birder Robson, MD;  Location: [facility];  Service: Ophthalmology;  Laterality: Left;  [place] 00:59 AP% 26.7 CDE 15.85 fluid pack lot#[id number]  . CERVICAL DISC SURGERY     x 2  . CHOLECYSTECTOMY  2004  . COLONOSCOPY  2/02   polyps, BE-polyps  . EYE SURGERY     right eye  . MOUTH SURGERY     lower teeth have 4 studs to keep in place  . TONSILLECTOMY    . TOTAL KNEE ARTHROPLASTY Right 08/13/2014   Procedure: RIGHT TOTAL KNEE ARTHROPLASTY;  Surgeon: Mcarthur Rossetti, MD;  Location: [facility];  Service: Orthopedics;  Laterality:  Right;  . TYMPANOPLASTY    . US TRANSVAGINAL PELVIC MODIFIED  10/02   small left ovarian cyst   Social History   Tobacco Use  . Smoking status: Never Smoker  . Smokeless tobacco: Never Used  Substance Use Topics  . Alcohol use: No    Comment: "is allergic"  . Drug use: No   Family History  Problem Relation Age of Onset  . Heart attack Mother   . Stroke Mother        several  . Lupus Brother   . Other Sister        benign brain tumor  . Asthma Daughter   . Breast cancer Daughter   . Other Brother        heart problems  . Colon cancer Unknown        1st cousin   Allergies  Allergen Reactions  . Alcohol-Sulfur [Sulfur] Shortness Of Breath and Swelling    Alcohol po swells throat  . Ergocalciferol Other (See Comments)    REACTION: GI side effects  . Morphine Nausea And Vomiting  . Tolterodine Tartrate Other (See Comments)    REACTION: ? reaction  . Alcohol Other (See Comments) and Swelling  . Myrbetriq [Mirabegron] Other (See Comments)    GI Upset  . Nortriptyline Hcl Other (See Comments)    unknown   Current Outpatient Medications on File Prior to Visit  Medication Sig Dispense Refill  . albuterol (PROVENTIL) 4 MG tablet TAKE 1 TABLET(4 MG) BY MOUTH EVERY 4 HOURS AS NEEDED 30 tablet 5  . Apoaequorin (PREVAGEN) 10 MG CAPS Take 1 capsule by mouth daily.    Marland Kitchen aspirin 325 MG tablet Take 325 mg by mouth daily.    . Calcium Carbonate-Vit D-Min (CALTRATE 600+D PLUS MINERALS) 600-800 MG-UNIT TABS Take 1 capsule by mouth daily.    . Cholecalciferol (VITAMIN D3 GUMMIES PO) Take 1 tablet by mouth daily.    . diazepam (VALIUM) 5 MG tablet TAKE 1 TABLET BY MOUTH AT BEDTIME AS NEEDED FOR ANXIETY. TAKE 1 TABLET BY MOUTH THREE TIMES DAILY AS NEEDED 30 tablet 0  . furosemide (LASIX) 40 MG tablet Take 0.5 tablets (20 mg total) by mouth 2 (two) times daily. 30 tablet 11  . hydrocortisone cream 1 % Apply 1 application topically 2 (two) times daily.    . mometasone (ELOCON) 0.1 % cream  Apply 1 application topically daily. 45 g 1  . Omega-3 Fatty Acids (FISH OIL) 1000 MG CAPS Take 1 tablet by mouth daily.    Marland Kitchen OVER THE COUNTER MEDICATION [facility]  Fiber + B Vitamins-chew two daily    . Prenatal Vit-Fe Fumarate-FA (PRENATAL MULTIVITAMIN/IRON PO) Take 1 tablet by mouth daily.    . Probiotic Product (ALIGN) 4 MG CAPS Take by mouth every other day.    Marland Kitchen SYNTHROID 88 MCG tablet Take 1  tablet (88 mcg total) by mouth daily before breakfast. 90 tablet 2   No current facility-administered medications on file prior to visit.    Review of Systems  Constitutional: Negative for activity change, appetite change, fatigue, fever and unexpected weight change.  HENT: Negative for congestion, ear pain, rhinorrhea, sinus pressure and sore throat.   Eyes: Negative for pain, redness and visual disturbance.  Respiratory: Negative for cough, shortness of breath and wheezing.   Cardiovascular: Negative for chest pain and palpitations.  Gastrointestinal: Negative for abdominal pain, blood in stool, constipation and diarrhea.  Endocrine: Negative for polydipsia and polyuria.  Genitourinary: Negative for dysuria, frequency and urgency.  Musculoskeletal: Negative for arthralgias, back pain and myalgias.  Skin: Positive for rash. Negative for pallor and wound.  Allergic/Immunologic: Negative for environmental allergies.  Neurological: Negative for dizziness, syncope and headaches.  Hematological: Negative for adenopathy. Does not bruise/bleed easily.  Psychiatric/Behavioral: Negative for decreased concentration and dysphoric mood. The patient is not nervous/anxious.        Objective:   Physical Exam Constitutional:      General: She is not in acute distress.    Appearance: Normal appearance. She is normal weight. She is not ill-appearing.  HENT:     Mouth/Throat:     Mouth: Mucous membranes are moist.  Eyes:     General:        Right eye: No discharge.        Left eye: No discharge.      Conjunctiva/sclera: Conjunctivae normal.     Pupils: Pupils are equal, round, and reactive to light.  Neck:     Comments: Exam consistent with few reactive L posterior cervical LN  Cardiovascular:     Rate and Rhythm: Normal rate and regular rhythm.     Heart sounds: Normal heart sounds.  Abdominal:     Palpations: There is mass.  Musculoskeletal:     Cervical back: Normal range of motion. No tenderness.  Lymphadenopathy:     Cervical: Cervical adenopathy present.  Skin:    General: Skin is warm and dry.     Coloration: Skin is not jaundiced.     Findings: Erythema and rash present. No bruising.     Comments: Small areas of erythema and scale at base of scalp on the right (No excoriation)  Inferior to this-skin is slightly thickened and pink w/o scale  Few small (shotty sized) lumps -non tender -consistent with lymph nodes  Some pink patches on R anterior forearm w/o scale (few scabs) consistent with dermatitis   Neurological:     Mental Status: She is alert.     Sensory: No sensory deficit.  Psychiatric:        Mood and Affect: Mood normal.           Assessment & Plan:   Problem List Items Addressed This Visit      Musculoskeletal and Integument   Psoriasis - Primary    Worsened lately (pt makes point that worse after 2nd covid shot)- unsure if related  Scalp and neck area  Refilled derma smoothe oil for scalp Enc use of mometasone for other areas prn  No thick plaques-mild for the most part If no improvement offered ref to dermatology

## 2020-04-14 NOTE — Assessment & Plan Note (Signed)
Worsened lately (pt makes point that worse after 2nd covid shot)- unsure if related  Scalp and neck area  Refilled derma smoothe oil for scalp Enc use of mometasone for other areas prn  No thick plaques-mild for the most part If no improvement offered ref to dermatology

## 2020-04-27 ENCOUNTER — Other Ambulatory Visit: Payer: Self-pay | Admitting: Family Medicine

## 2020-06-22 ENCOUNTER — Other Ambulatory Visit: Payer: Self-pay | Admitting: Family Medicine

## 2020-06-22 NOTE — Telephone Encounter (Signed)
Name of Medication: Valium Name of Pharmacy: [facility] [place] or Written Date and Quantity: 05/03/20 #30 tab with 0 refills  Last Office Visit and Type: Psoriasis appt on 04/14/20 Next Office Visit and Type: CPE on 07/06/20

## 2020-06-28 ENCOUNTER — Telehealth: Payer: Self-pay | Admitting: Family Medicine

## 2020-06-28 DIAGNOSIS — E039 Hypothyroidism, unspecified: Secondary | ICD-10-CM

## 2020-06-28 DIAGNOSIS — E7800 Pure hypercholesterolemia, unspecified: Secondary | ICD-10-CM

## 2020-06-28 DIAGNOSIS — E538 Deficiency of other specified B group vitamins: Secondary | ICD-10-CM

## 2020-06-28 DIAGNOSIS — M810 Age-related osteoporosis without current pathological fracture: Secondary | ICD-10-CM

## 2020-06-28 DIAGNOSIS — E559 Vitamin D deficiency, unspecified: Secondary | ICD-10-CM

## 2020-06-28 DIAGNOSIS — Z0000 Encounter for general adult medical examination without abnormal findings: Secondary | ICD-10-CM

## 2020-06-28 NOTE — Telephone Encounter (Signed)
-----   Message from Cloyd Stagers, RT sent at 06/17/2020 11:20 AM EDT ----- Regarding: Lab Orders for Wednesday 8.4.2021 Please place lab orders for Wednesday 8.4.2021, office visit for physical on Wednesday 8.11.2021 Thank you, Dyke Maes RT(R)

## 2020-06-29 ENCOUNTER — Other Ambulatory Visit (INDEPENDENT_AMBULATORY_CARE_PROVIDER_SITE_OTHER): Payer: Medicare Other

## 2020-06-29 ENCOUNTER — Other Ambulatory Visit: Payer: Self-pay

## 2020-06-29 DIAGNOSIS — E039 Hypothyroidism, unspecified: Secondary | ICD-10-CM

## 2020-06-29 DIAGNOSIS — E7800 Pure hypercholesterolemia, unspecified: Secondary | ICD-10-CM | POA: Diagnosis not present

## 2020-06-29 DIAGNOSIS — Z0000 Encounter for general adult medical examination without abnormal findings: Secondary | ICD-10-CM

## 2020-06-29 DIAGNOSIS — E538 Deficiency of other specified B group vitamins: Secondary | ICD-10-CM

## 2020-06-29 DIAGNOSIS — E559 Vitamin D deficiency, unspecified: Secondary | ICD-10-CM | POA: Diagnosis not present

## 2020-06-29 LAB — COMPREHENSIVE METABOLIC PANEL
ALT: 12 U/L (ref 0–35)
AST: 17 U/L (ref 0–37)
Albumin: 4 g/dL (ref 3.5–5.2)
Alkaline Phosphatase: 62 U/L (ref 39–117)
BUN: 24 mg/dL — ABNORMAL HIGH (ref 6–23)
CO2: 31 mEq/L (ref 19–32)
Calcium: 9.4 mg/dL (ref 8.4–10.5)
Chloride: 105 mEq/L (ref 96–112)
Creatinine, Ser: 0.86 mg/dL (ref 0.40–1.20)
GFR: 62.64 mL/min (ref >60.00)
Glucose, Bld: 82 mg/dL (ref 70–99)
Potassium: 4.1 mEq/L (ref 3.5–5.1)
Sodium: 141 mEq/L (ref 135–145)
Total Bilirubin: 0.5 mg/dL (ref 0.2–1.2)
Total Protein: 6.4 g/dL (ref 6.0–8.3)

## 2020-06-29 LAB — CBC WITH DIFFERENTIAL/PLATELET
Basophils Absolute: 0 10*3/uL (ref 0.0–0.1)
Basophils Relative: 0.6 % (ref 0.0–3.0)
Eosinophils Absolute: 0.4 10*3/uL (ref 0.0–0.7)
Eosinophils Relative: 7.5 % — ABNORMAL HIGH (ref 0.0–5.0)
HCT: 38.2 % (ref 36.0–46.0)
Hemoglobin: 13 g/dL (ref 12.0–15.0)
Lymphocytes Relative: 49.5 % — ABNORMAL HIGH (ref 12.0–46.0)
Lymphs Abs: 2.5 10*3/uL (ref 0.7–4.0)
MCHC: 33.9 g/dL (ref 30.0–36.0)
MCV: 94.4 fl (ref 78.0–100.0)
Monocytes Absolute: 0.4 10*3/uL (ref 0.1–1.0)
Monocytes Relative: 7.7 % (ref 3.0–12.0)
Neutro Abs: 1.8 10*3/uL (ref 1.4–7.7)
Neutrophils Relative %: 34.7 % — ABNORMAL LOW (ref 43.0–77.0)
Platelets: 164 10*3/uL (ref 150.0–400.0)
RBC: 4.05 Mil/uL (ref 3.87–5.11)
RDW: 13 % (ref 11.5–15.5)
WBC: 5.1 10*3/uL (ref 4.0–10.5)

## 2020-06-29 LAB — TSH: TSH: 1.02 u[IU]/mL (ref 0.35–4.50)

## 2020-06-29 LAB — LIPID PANEL
Cholesterol: 219 mg/dL — ABNORMAL HIGH (ref 0–200)
HDL: 67.5 mg/dL (ref >39.00)
LDL Cholesterol: 138 mg/dL — ABNORMAL HIGH (ref 0–99)
NonHDL: 151.24
Total CHOL/HDL Ratio: 3
Triglycerides: 64 mg/dL (ref 0.0–149.0)
VLDL: 12.8 mg/dL (ref 0.0–40.0)

## 2020-06-29 LAB — VITAMIN B12: Vitamin B-12: 356 pg/mL (ref 211–911)

## 2020-06-29 LAB — VITAMIN D 25 HYDROXY (VIT D DEFICIENCY, FRACTURES): VITD: 66.47 ng/mL (ref 30.00–100.00)

## 2020-07-06 ENCOUNTER — Other Ambulatory Visit: Payer: Self-pay

## 2020-07-06 ENCOUNTER — Encounter: Payer: Self-pay | Admitting: Family Medicine

## 2020-07-06 ENCOUNTER — Ambulatory Visit (INDEPENDENT_AMBULATORY_CARE_PROVIDER_SITE_OTHER): Payer: Medicare Other | Admitting: Family Medicine

## 2020-07-06 VITALS — BP 138/80 | HR 95 | Temp 97.1°F | Ht 65.75 in | Wt 157.3 lb

## 2020-07-06 DIAGNOSIS — H9192 Unspecified hearing loss, left ear: Secondary | ICD-10-CM

## 2020-07-06 DIAGNOSIS — E039 Hypothyroidism, unspecified: Secondary | ICD-10-CM

## 2020-07-06 DIAGNOSIS — M810 Age-related osteoporosis without current pathological fracture: Secondary | ICD-10-CM | POA: Diagnosis not present

## 2020-07-06 DIAGNOSIS — H9190 Unspecified hearing loss, unspecified ear: Secondary | ICD-10-CM | POA: Insufficient documentation

## 2020-07-06 DIAGNOSIS — E538 Deficiency of other specified B group vitamins: Secondary | ICD-10-CM | POA: Diagnosis not present

## 2020-07-06 DIAGNOSIS — E7800 Pure hypercholesterolemia, unspecified: Secondary | ICD-10-CM

## 2020-07-06 DIAGNOSIS — Z0000 Encounter for general adult medical examination without abnormal findings: Secondary | ICD-10-CM

## 2020-07-06 DIAGNOSIS — E559 Vitamin D deficiency, unspecified: Secondary | ICD-10-CM

## 2020-07-06 DIAGNOSIS — L409 Psoriasis, unspecified: Secondary | ICD-10-CM

## 2020-07-06 NOTE — Progress Notes (Signed)
Subjective:    Patient ID: Carla Kelly, female    DOB: 01-Oct-1933, 86 y.o.   MRN: [id number]  This visit occurred during the SARS-CoV-2 public health emergency.  Safety protocols were in place, including screening questions prior to the visit, additional usage of staff PPE, and extensive cleaning of exam room while observing appropriate contact time as indicated for disinfecting solutions.    HPI Pt presents for amw and health mt exam with rev of chronic medical problems   I have personally reviewed the Medicare Annual Wellness questionnaire and have noted 1. The patient's medical and social history 2. Their use of alcohol, tobacco or illicit drugs 3. Their current medications and supplements 4. The patient's functional ability including ADL's, fall risks, home safety risks and hearing or visual             impairment. 5. Diet and physical activities 6. Evidence for depression or mood disorders  The patients weight, height, BMI have been recorded in the chart and visual acuity is per eye clinic.  I have made referrals, counseling and provided education to the patient based review of the above and I have provided the pt with a written personalized care plan for preventive services. Reviewed and updated provider list, see scanned forms.  See scanned forms.  Routine anticipatory guidance given to patient.  See health maintenance. Colon cancer screening    Colonoscopy 7/11= polyps  Breast cancer screening  Mammogram 6/19  Self breast exam-no lumps daugher had breast cancer  Due for f/u with Dr Helane Rima -she will call to schedule (has an area in R pelvis they are watching) Flu vaccine -gets every fall  covid- vaccinated  Tetanus vaccine  Td 1/14 Pneumovax-completed Zoster vaccine-not ready yet  Dexa 6/18 - osteoporosis Falls- none Fractures- none recent , had ankle fracture about 14 years  Supplements- taking her vit D Vit D level is good at 66.4 Exercise - exercise bike    (she  has had a L knee injury and old R knee replacement)  Advance directive-has a living will and poa (her daughter)  Cognitive function addressed- see scanned forms- and if abnormal then additional documentation follows.   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   ROS: See HPI.  Otherwise negative.    Care team Jissel Slavens-pc[facility] Porfil[place]- [place]- gyn    Weight : Wt Readings from Last 3 Encounters:  07/06/20 157 lb 5 oz (71.4 kg)  04/14/20 161 lb 5 oz (73.2 kg)  06/11/19 154 lb (69.9 kg)   25.58 kg/m  Ht Readings from Last 3 Encounters:  07/06/20 5' 5.75" (1.67 m)  04/14/20 5' 6.5" (1.689 m)  06/11/19 5' 6.5" (1.689 m)     Hearing/vision:  Hearing Screening   125Hz  250Hz  500Hz  1000Hz  2000Hz  3000Hz  4000Hz  6000Hz  8000Hz   Right ear:   40 40 40  40    Left ear:   40 0 0  0    Vision Screening Comments: Pt has eye exam scheduled with [facility] in Oct 2021 per pt - L ear has been bothersome/pain  She had surgery for a ruptured eardrum in the past-  ? Patch     BP Readings from Last 3 Encounters:  07/06/20 (!) 144/76  04/14/20 (!) 146/80  06/11/19 117/62  re check BP: 138/80   Pulse Readings from Last 3 Encounters:  07/06/20 95  04/14/20 (!) 104  06/11/19 98    Hypothyroidism  Pt has no clinical changes No change in energy level/ hair or skin/ edema and no tremor Lab Results  Component Value Date   TSH 1.02 06/29/2020     H/o vit B12 def Lab Results  Component Value Date   VITAMINB12 356 06/29/2020   Takes oral B12  Hyperlipidemia Lab Results  Component Value Date   CHOL 219 (H) 06/29/2020   CHOL 190 06/04/2019   CHOL 201 (H) 03/06/2018   Lab Results  Component Value Date   HDL 67.50 06/29/2020   HDL 53.70 06/04/2019   HDL 67.80 03/06/2018   Lab Results    Component Value Date   LDLCALC 138 (H) 06/29/2020   LDLCALC 103 (H) 06/04/2019   LDLCALC 118 (H) 03/06/2018   Lab Results  Component Value Date   TRIG 64.0 06/29/2020   TRIG 166.0 (H) 06/04/2019   TRIG 74.0 03/06/2018   Lab Results  Component Value Date   CHOLHDL 3 06/29/2020   CHOLHDL 4 06/04/2019   CHOLHDL 3 03/06/2018   Lab Results  Component Value Date   LDLDIRECT 149.5 12/08/2013   LDLDIRECT 111.0 12/04/2012   LDLDIRECT 128.3 09/17/2011   LDL is up  Has eaten a lot more eggs and cheese  HDL is up also  Ratio is improved however    Lab Results  Component Value Date   CREATININE 0.86 06/29/2020   BUN 24 (H) 06/29/2020   NA 141 06/29/2020   K 4.1 06/29/2020   CL 105 06/29/2020   CO2 31 06/29/2020   She takes diuretic   Lab Results  Component Value Date   ALT 12 06/29/2020   AST 17 06/29/2020   ALKPHOS 62 06/29/2020   BILITOT 0.5 06/29/2020    Lab Results  Component Value Date   WBC 5.1 06/29/2020   HGB 13.0 06/29/2020   HCT 38.2 06/29/2020   MCV 94.4 06/29/2020   PLT 164.0 06/29/2020    Patient Active Problem List   Diagnosis Date Noted  . Hearing loss 07/06/2020  . Hammer toe of right foot 03/12/2018  . Screening mammogram, encounter for 03/04/2017  . Routine general medical examination at a health care facility 02/12/2016  . Estrogen deficiency 02/15/2015  . Colon cancer screening 02/15/2015  . Arthritis of right knee 08/13/2014  . Status post total right knee replacement 08/13/2014  . Transient global amnesia 02/20/2014  . Encounter for Medicare annual wellness exam 12/07/2013  . Psoriasis 02/16/2013  . Other screening mammogram 08/19/2012  . Pedal edema 03/11/2012  . B12 deficiency 09/05/2009  . Vitamin D deficiency 11/08/2008  . Hyperlipidemia 08/25/2008  . Osteoporosis 08/25/2008  . History of colonic polyps 08/25/2008  . HERPES ZOSTER, HX OF 06/03/2008  . Hypothyroidism 08/04/2007  . HYPOGLYCEMIA 08/04/2007  . ASTHMA  08/04/2007  . IRRITABLE BOWEL SYNDROME 08/04/2007  . VAGINITIS, ATROPHIC 08/04/2007  . Osteoarthrosis, unspecified whether generalized or localized, unspecified site 08/04/2007  . FIBROMYALGIA 08/04/2007  . URINARY INCONTINENCE 08/04/2007   Past Medical History:  Diagnosis Date  . Adenomatous colon polyp   . Asthma   . Depression   . Disorder of bone and cartilage, unspecified   . Embolism and thrombosis of unspecified site   . Fibromyalgia   . History of herpes zoster   . HLD (hyperlipidemia)   . Hypoglycemia   . Hypothyroidism   .  IBS (irritable bowel syndrome)   . Mitral valve disorder    "leaking mitral valve-very mild"  . Orbital floor fracture ([place]) 10/16   with a fall   . Osteoarthritis   . Osteoarthrosis, unspecified whether generalized or localized, unspecified site   . Other B-complex deficiencies   . Psoriasis   . Stroke ([place])   . Swelling of extremity, right    right leg  . 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 Unspecified urinary incontinence   . Unspecified vitamin D deficiency    Past Surgical History:  Procedure Laterality Date  . ABDOMINAL HYSTERECTOMY  1970   fibroids; endometriosis  . APPENDECTOMY    . bladder tack  5/05  . BREAST BIOPSY Right 1995  . BREAST CYST ASPIRATION Left 03/17/2015  . CATARACT EXTRACTION Right 12/13   Dr George Ina  . CATARACT EXTRACTION W/PHACO Left 05/10/2015   Procedure: CATARACT EXTRACTION PHACO AND INTRAOCULAR LENS PLACEMENT (IOC);  Surgeon: Birder Robson, MD;  Location: [facility] ORS;  Service: Ophthalmology;  Laterality: Left;  [place] 00:59 AP% 26.7 CDE 15.85 fluid pack lot#[id number]  . CERVICAL DISC SURGERY     x 2  . CHOLECYSTECTOMY  2004  . COLONOSCOPY  2/02   polyps, BE-polyps  . EYE SURGERY     right eye  . MOUTH SURGERY     lower teeth have 4 studs to keep in place  . TONSILLECTOMY    . TOTAL KNEE ARTHROPLASTY Right 08/13/2014   Procedure: RIGHT TOTAL KNEE ARTHROPLASTY;  Surgeon: Mcarthur Rossetti, MD;   Location: [facility] ORS;  Service: Orthopedics;  Laterality: Right;  . TYMPANOPLASTY    . US TRANSVAGINAL PELVIC MODIFIED  10/02   small left ovarian cyst   Social History   Tobacco Use  . Smoking status: Never Smoker  . Smokeless tobacco: Never Used  Vaping Use  . Vaping Use: Never used  Substance Use Topics  . Alcohol use: No    Comment: "is allergic"  . Drug use: No   Family History  Problem Relation Age of Onset  . Heart attack Mother   . Stroke Mother        several  . Lupus Brother   . Other Sister        benign brain tumor  . Asthma Daughter   . Breast cancer Daughter   . Other Brother        heart problems  . Colon cancer Unknown        1st cousin   Allergies  Allergen Reactions  . Alcohol-Sulfur [Sulfur] Shortness Of Breath and Swelling    Alcohol po swells throat  . Ergocalciferol Other (See Comments)    REACTION: GI side effects  . Morphine Nausea And Vomiting  . Tolterodine Tartrate Other (See Comments)    REACTION: ? reaction  . Alcohol Other (See Comments) and Swelling  . Myrbetriq [Mirabegron] Other (See Comments)    GI Upset  . Nortriptyline Hcl Other (See Comments)    unknown   Current Outpatient Medications on File Prior to Visit  Medication Sig Dispense Refill  . albuterol (PROVENTIL) 4 MG tablet TAKE 1 TABLET(4 MG) BY MOUTH EVERY 4 HOURS AS NEEDED 30 tablet 5  . Apoaequorin (PREVAGEN) 10 MG CAPS Take 1 capsule by mouth daily.    . Ascorbic Acid (VITAMIN C) 1000 MG tablet Take 1 tablet by mouth daily.    Marland Kitchen aspirin 325 MG tablet Take 325 mg by mouth daily.    . Calcium Carbonate-Vit D-Min (  CALTRATE 600+D PLUS MINERALS) 600-800 MG-UNIT TABS Take 1 capsule by mouth daily.    . Cholecalciferol (VITAMIN D3 GUMMIES PO) Take 1 tablet by mouth daily.    . diazepam (VALIUM) 5 MG tablet TAKE 1 TABLET BY MOUTH AT BEDTIME AS NEEDED FOR ANXIETY 30 tablet 0  . Fluocinolone Acetonide Scalp (DERMA-SMOOTHE/FS SCALP) 0.01 % OIL Apply to affected area (rash/psoriasis)  three times daily as needed 118 mL 1  . furosemide (LASIX) 40 MG tablet Take 0.5 tablets (20 mg total) by mouth 2 (two) times daily. 30 tablet 11  . hydrocortisone cream 1 % Apply 1 application topically 2 (two) times daily.    . mometasone (ELOCON) 0.1 % cream Apply 1 application topically daily. 45 g 1  . Omega-3 Fatty Acids (FISH OIL) 1000 MG CAPS Take 1 tablet by mouth daily.    Marland Kitchen OVER THE COUNTER MEDICATION [facility]  Fiber + B Vitamins-chew two daily    . Prenatal Vit-Fe Fumarate-FA (PRENATAL MULTIVITAMIN/IRON PO) Take 1 tablet by mouth daily.    . Probiotic Product (ALIGN) 4 MG CAPS Take by mouth every other day.    Marland Kitchen SYNTHROID 88 MCG tablet Take 1 tablet (88 mcg total) by mouth daily before breakfast. 90 tablet 2   No current facility-administered medications on file prior to visit.     Review of Systems  Constitutional: Negative for activity change, appetite change, fatigue, fever and unexpected weight change.  HENT: Positive for ear pain and hearing loss. Negative for congestion, rhinorrhea, sinus pressure and sore throat.   Eyes: Negative for pain, redness and visual disturbance.  Respiratory: Negative for cough, shortness of breath and wheezing.   Cardiovascular: Negative for chest pain and palpitations.  Gastrointestinal: Negative for abdominal pain, blood in stool, constipation and diarrhea.  Endocrine: Negative for polydipsia and polyuria.  Genitourinary: Negative for dysuria, frequency and urgency.  Musculoskeletal: Positive for arthralgias and back pain. Negative for myalgias.  Skin: Negative for pallor and rash.  Allergic/Immunologic: Negative for environmental allergies.  Neurological: Negative for dizziness, syncope and headaches.  Hematological: Negative for adenopathy. Does not bruise/bleed easily.  Psychiatric/Behavioral: Negative for decreased concentration and dysphoric mood. The patient is not nervous/anxious.        Objective:   Physical Exam Constitutional:       General: She is not in acute distress.    Appearance: Normal appearance. She is well-developed and normal weight. She is not ill-appearing or diaphoretic.  HENT:     Head: Normocephalic and atraumatic.     Right Ear: Tympanic membrane, ear canal and external ear normal.     Left Ear: Tympanic membrane, ear canal and external ear normal.     Nose: Nose normal. No congestion.     Mouth/Throat:     Mouth: Mucous membranes are moist.     Pharynx: Oropharynx is clear. No posterior oropharyngeal erythema.  Eyes:     General: No scleral icterus.    Extraocular Movements: Extraocular movements intact.     Conjunctiva/sclera: Conjunctivae normal.     Pupils: Pupils are equal, round, and reactive to light.  Neck:     Thyroid: No thyromegaly.     Vascular: No carotid bruit or JVD.  Cardiovascular:     Rate and Rhythm: Normal rate and regular rhythm.     Pulses: Normal pulses.     Heart sounds: Normal heart sounds. No gallop.   Pulmonary:     Effort: Pulmonary effort is normal. No respiratory distress.  Breath sounds: Normal breath sounds. No wheezing.     Comments: Good air exch Chest:     Chest wall: No tenderness.  Abdominal:     General: Bowel sounds are normal. There is no distension or abdominal bruit.     Palpations: Abdomen is soft. There is no mass.     Tenderness: There is no abdominal tenderness.     Hernia: No hernia is present.  Genitourinary:    Comments: Breast exam: No mass, nodules, thickening, tenderness, bulging, retraction, inflamation, nipple discharge or skin changes noted.  No axillary or clavicular LA.     Musculoskeletal:        General: No tenderness. Normal range of motion.     Cervical back: Normal range of motion and neck supple. No rigidity. No muscular tenderness.     Right lower leg: No edema.     Left lower leg: No edema.  Lymphadenopathy:     Cervical: No cervical adenopathy.  Skin:    General: Skin is warm and dry.     Coloration: Skin is not  pale.     Findings: No erythema or rash.     Comments: sks on trunk  Some angiomas  Neurological:     Mental Status: She is alert. Mental status is at baseline.     Cranial Nerves: No cranial nerve deficit.     Motor: No abnormal muscle tone.     Coordination: Coordination normal.     Gait: Gait normal.     Deep Tendon Reflexes: Reflexes are normal and symmetric. Reflexes normal.  Psychiatric:        Mood and Affect: Mood normal.        Cognition and Memory: Cognition and memory normal.     Comments: Very mentally sharp Pleasant and talkative           Assessment & Plan:   Problem List Items Addressed This Visit      Endocrine   Hypothyroidism    Hypothyroidism  Pt has no clinical changes No change in energy level/ hair or skin/ edema and no tremor Lab Results  Component Value Date   TSH 1.02 06/29/2020            Nervous and Auditory   Hearing loss    Worsening hearing loss in L ear with some intermittent pain  H/o TM repair after rupture years ago  No congestion or other symptoms Ref made to ENT      Relevant Orders   Ambulatory referral to ENT     Musculoskeletal and Integument   Osteoporosis    dexa 6/18 She is not yet ready to schedule another one and unsure if she would ever treat the problem  No falls or fractures D level is tx  Good exercise       Psoriasis    Some improvement with generic derma smoothe oil        Other   B12 deficiency    Lab Results  Component Value Date   VITAMINB12 356 06/29/2020   With oral supplementation       Vitamin D deficiency    Good D level of 66.4 with current supplementation  Vitamin D level is therapeutic with current supplementation Disc importance of this to bone and overall health       Hyperlipidemia    Disc goals for lipids and reasons to control them Rev last labs with pt Rev low sat fat diet in detail Both LDL and HDL  are up but chol/HDL ratio has improved  Per pt -eating too many  eggs/cheese and will cut back       Encounter for Medicare annual wellness exam - Primary    Reviewed health habits including diet and exercise and skin cancer prevention Reviewed appropriate screening tests for age  Also reviewed health mt list, fam hx and immunization status , as well as social and family history   See HPI Labs reviewed  Enc pt to schedule mammogram and number given Planning f/u with gyn for f/u  Vaccinated for covid  Disc shingrix-she is not ready to get this  Declines dexa currently / no falls or fractures Advance directive is utd No cognitive concerns-very sharp Hearing loss in L ear-referred to ENT Eye exam is scheduled        Routine general medical examination at a health care facility    Reviewed health habits including diet and exercise and skin cancer prevention Reviewed appropriate screening tests for age  Also reviewed health mt list, fam hx and immunization status , as well as social and family history   See HPI Labs reviewed  Enc pt to schedule mammogram and number given Planning f/u with gyn for f/u  Vaccinated for covid  Disc shingrix-she is not ready to get this  Declines dexa currently / no falls or fractures Advance directive is utd No cognitive concerns-very sharp Hearing loss in L ear-referred to ENT Eye exam is scheduled

## 2020-07-06 NOTE — Assessment & Plan Note (Signed)
Worsening hearing loss in L ear with some intermittent pain  H/o TM repair after rupture years ago  No congestion or other symptoms Ref made to ENT

## 2020-07-06 NOTE — Assessment & Plan Note (Signed)
Disc goals for lipids and reasons to control them Rev last labs with pt Rev low sat fat diet in detail Both LDL and HDL are up but chol/HDL ratio has improved  Per pt -eating too many eggs/cheese and will cut back

## 2020-07-06 NOTE — Assessment & Plan Note (Signed)
Hypothyroidism  Pt has no clinical changes No change in energy level/ hair or skin/ edema and no tremor Lab Results  Component Value Date   TSH 1.02 06/29/2020

## 2020-07-06 NOTE — Assessment & Plan Note (Signed)
Good D level of 66.4 with current supplementation  Vitamin D level is therapeutic with current supplementation Disc importance of this to bone and overall health

## 2020-07-06 NOTE — Assessment & Plan Note (Signed)
Some improvement with generic derma smoothe oil

## 2020-07-06 NOTE — Assessment & Plan Note (Signed)
Reviewed health habits including diet and exercise and skin cancer prevention Reviewed appropriate screening tests for age  Also reviewed health mt list, fam hx and immunization status , as well as social and family history   See HPI Labs reviewed  Enc pt to schedule mammogram and number given Planning f/u with gyn for f/u  Vaccinated for covid  Disc shingrix-she is not ready to get this  Declines dexa currently / no falls or fractures Advance directive is utd No cognitive concerns-very sharp Hearing loss in L ear-referred to ENT Eye exam is scheduled

## 2020-07-06 NOTE — Assessment & Plan Note (Signed)
Lab Results  Component Value Date   [id number] 06/29/2020   With oral supplementation

## 2020-07-06 NOTE — Patient Instructions (Addendum)
Follow up with Dr Helane Rima  Call and schedule your mammogram   If you are interested in the new shingles vaccine (Shingrix) - call your local pharmacy to check on coverage and availability  If affordable, get on a wait list at your pharmacy to get the vaccine.  Continue to think about a bone density test and let me know if you want to schedule it   Our office will call you regarding ENT referral   Try to stay active and eat a balance diet

## 2020-07-06 NOTE — Assessment & Plan Note (Signed)
dexa 6/18 She is not yet ready to schedule another one and unsure if she would ever treat the problem  No falls or fractures D level is tx  Good exercise

## 2020-07-11 ENCOUNTER — Encounter: Payer: Self-pay | Admitting: Family Medicine

## 2020-07-12 MED ORDER — ALBUTEROL SULFATE 4 MG PO TABS: ORAL_TABLET | ORAL | 5 refills | Status: DC

## 2020-07-13 DIAGNOSIS — H9202 Otalgia, left ear: Secondary | ICD-10-CM | POA: Diagnosis not present

## 2020-07-13 DIAGNOSIS — H9313 Tinnitus, bilateral: Secondary | ICD-10-CM | POA: Diagnosis not present

## 2020-07-13 DIAGNOSIS — H90A32 Mixed conductive and sensorineural hearing loss, unilateral, left ear with restricted hearing on the contralateral side: Secondary | ICD-10-CM | POA: Diagnosis not present

## 2020-07-21 ENCOUNTER — Encounter: Payer: Self-pay | Admitting: Family Medicine

## 2020-08-10 ENCOUNTER — Other Ambulatory Visit: Payer: Self-pay | Admitting: Family Medicine

## 2020-08-10 NOTE — Telephone Encounter (Signed)
Name of Medication: Valium Name of Pharmacy: [facility] or Written Date and Quantity: 06/22/20 #30 tab with 0 refills  Last Office Visit and Type: CPE on 07/06/20 Next Office Visit and Type: none schedule

## 2020-08-31 DIAGNOSIS — Z961 Presence of intraocular lens: Secondary | ICD-10-CM | POA: Diagnosis not present

## 2020-09-13 ENCOUNTER — Other Ambulatory Visit: Payer: Self-pay | Admitting: Family Medicine

## 2020-09-13 NOTE — Telephone Encounter (Signed)
Last OV 07/06/20 Last fill 08/10/20 #30/0

## 2020-09-23 ENCOUNTER — Other Ambulatory Visit: Payer: Self-pay

## 2020-09-23 ENCOUNTER — Telehealth: Payer: Self-pay

## 2020-09-23 ENCOUNTER — Emergency Department: Payer: Medicare Other

## 2020-09-23 ENCOUNTER — Emergency Department
Admission: EM | Admit: 2020-09-23 | Discharge: 2020-09-23 | Disposition: A | Discharge status: Home / Self Care | Payer: Medicare Other | Attending: Emergency Medicine | Admitting: Emergency Medicine

## 2020-09-23 DIAGNOSIS — S32010A Wedge compression fracture of first lumbar vertebra, initial encounter for closed fracture: Secondary | ICD-10-CM

## 2020-09-23 DIAGNOSIS — E039 Hypothyroidism, unspecified: Secondary | ICD-10-CM | POA: Insufficient documentation

## 2020-09-23 DIAGNOSIS — R1012 Left upper quadrant pain: Secondary | ICD-10-CM | POA: Diagnosis not present

## 2020-09-23 DIAGNOSIS — J45909 Unspecified asthma, uncomplicated: Secondary | ICD-10-CM | POA: Diagnosis not present

## 2020-09-23 DIAGNOSIS — K5909 Other constipation: Secondary | ICD-10-CM

## 2020-09-23 DIAGNOSIS — Z20822 Contact with and (suspected) exposure to covid-19: Secondary | ICD-10-CM | POA: Insufficient documentation

## 2020-09-23 DIAGNOSIS — S34109A Unspecified injury to unspecified level of lumbar spinal cord, initial encounter: Secondary | ICD-10-CM | POA: Diagnosis present

## 2020-09-23 DIAGNOSIS — R109 Unspecified abdominal pain: Secondary | ICD-10-CM | POA: Diagnosis not present

## 2020-09-23 DIAGNOSIS — R1032 Left lower quadrant pain: Secondary | ICD-10-CM | POA: Diagnosis not present

## 2020-09-23 DIAGNOSIS — R9431 Abnormal electrocardiogram [ECG] [EKG]: Secondary | ICD-10-CM | POA: Diagnosis not present

## 2020-09-23 DIAGNOSIS — X500XXA Overexertion from strenuous movement or load, initial encounter: Secondary | ICD-10-CM | POA: Insufficient documentation

## 2020-09-23 DIAGNOSIS — Z96651 Presence of right artificial knee joint: Secondary | ICD-10-CM | POA: Diagnosis not present

## 2020-09-23 DIAGNOSIS — R1084 Generalized abdominal pain: Secondary | ICD-10-CM | POA: Diagnosis not present

## 2020-09-23 DIAGNOSIS — Z7982 Long term (current) use of aspirin: Secondary | ICD-10-CM | POA: Diagnosis not present

## 2020-09-23 DIAGNOSIS — Z79890 Hormone replacement therapy (postmenopausal): Secondary | ICD-10-CM | POA: Diagnosis not present

## 2020-09-23 LAB — COMPREHENSIVE METABOLIC PANEL
ALT: 19 U/L (ref 0–44)
AST: 26 U/L (ref 15–41)
Albumin: 4.1 g/dL (ref 3.5–5.0)
Alkaline Phosphatase: 88 U/L (ref 38–126)
Anion gap: 10 (ref 5–15)
BUN: 17 mg/dL (ref 8–23)
CO2: 23 mmol/L (ref 22–32)
Calcium: 8.8 mg/dL — ABNORMAL LOW (ref 8.9–10.3)
Chloride: 106 mmol/L (ref 98–111)
Creatinine, Ser: 0.66 mg/dL (ref 0.44–1.00)
GFR, Estimated: >60 mL/min (ref >60)
Glucose, Bld: 97 mg/dL (ref 70–99)
Potassium: 3.7 mmol/L (ref 3.5–5.1)
Sodium: 139 mmol/L (ref 135–145)
Total Bilirubin: 0.9 mg/dL (ref 0.3–1.2)
Total Protein: 7.1 g/dL (ref 6.5–8.1)

## 2020-09-23 LAB — CBC
HCT: 36.7 % (ref 36.0–46.0)
Hemoglobin: 12.4 g/dL (ref 12.0–15.0)
MCH: 31.3 pg (ref 26.0–34.0)
MCHC: 33.8 g/dL (ref 30.0–36.0)
MCV: 92.7 fL (ref 80.0–100.0)
Platelets: 235 10*3/uL (ref 150–400)
RBC: 3.96 MIL/uL (ref 3.87–5.11)
RDW: 12.9 % (ref 11.5–15.5)
WBC: 8.4 10*3/uL (ref 4.0–10.5)
nRBC: 0 % (ref 0.0–0.2)

## 2020-09-23 LAB — RESPIRATORY PANEL BY RT PCR (FLU A&B, COVID)
Influenza A by PCR: NEGATIVE
Influenza B by PCR: NEGATIVE
SARS Coronavirus 2 by RT PCR: NEGATIVE

## 2020-09-23 LAB — LIPASE, BLOOD: Lipase: 28 U/L (ref 11–51)

## 2020-09-23 MED ORDER — IOHEXOL 300 MG/ML  SOLN
100.0000 mL | Freq: Once | INTRAMUSCULAR | Status: AC | PRN
  Administered 2020-09-23: 100 mL via INTRAVENOUS

## 2020-09-23 MED ORDER — POLYETHYLENE GLYCOL 3350 17 G PO PACK
34.0000 g | PACK | Freq: Once | ORAL | Status: AC
  Administered 2020-09-23: 34 g via ORAL
  Filled 2020-09-23: qty 2

## 2020-09-23 MED ORDER — SORBITOL 70 % SOLN
960.0000 mL | TOPICAL_OIL | Freq: Once | ORAL | Status: AC
  Administered 2020-09-23: 960 mL via RECTAL
  Filled 2020-09-23: qty 473

## 2020-09-23 MED ORDER — LACTATED RINGERS IV BOLUS
1000.0000 mL | Freq: Once | INTRAVENOUS | Status: AC
  Administered 2020-09-23: 1000 mL via INTRAVENOUS

## 2020-09-23 MED ORDER — ONDANSETRON HCL 4 MG/2ML IJ SOLN
4.0000 mg | Freq: Once | INTRAMUSCULAR | Status: AC
  Administered 2020-09-23: 4 mg via INTRAVENOUS
  Filled 2020-09-23: qty 2

## 2020-09-23 MED ORDER — MORPHINE SULFATE (PF) 4 MG/ML IV SOLN
4.0000 mg | Freq: Once | INTRAVENOUS | Status: AC
  Administered 2020-09-23: 4 mg via INTRAVENOUS
  Filled 2020-09-23: qty 1

## 2020-09-23 NOTE — Telephone Encounter (Signed)
Pt's daughter left a message on triage line that they were at the [facility] ER.

## 2020-09-23 NOTE — ED Notes (Signed)
Pt presents to ED with c/o of having ABD pain and states she has not had a BM in about 5 days and states a small one in the past 10 days. Pt denies passing gas. BS x4 hypoactive at this time. Pt denies N/V to this RN. Pt is A&Ox4. Pt also states being more weak than normal and not being able to functio as she normally can. Pt states not being able to ambulate as normal and needing more help than normal. Pt is currently A&Ox4.

## 2020-09-23 NOTE — Telephone Encounter (Signed)
Aware ?Will watch for correspondence  ?

## 2020-09-23 NOTE — Discharge Instructions (Signed)
Keep your new brace on at all times while upright to help support your back and allow it to heal appropriately.  Use Tylenol for pain and fevers.  Up to 1000 mg per dose, up to 4 times per day.  Do not take more than 4000 mg of Tylenol/acetaminophen within 24 hours..  Please use MiraLAX for your constipation.  2 capfuls per dose mixed in your favorite noncarbonated drink, do this 1-2 times per day.  You may supplement with suppositories or enema as needed.  Return to the ED with any worsening symptoms.  Attached the phone number for a neurosurgeon to follow-up with to ensure your back is healing appropriately.

## 2020-09-23 NOTE — ED Notes (Signed)
Pt medicated per MAR. Had small bowel movement on toilet. Pt required RN assistance and use of cane to move from litter to toilet. AO x4

## 2020-09-23 NOTE — ED Notes (Signed)
Assumed care of pt at 1900. Pt resting in room, pain relieved from analgesics at this time. Pt favoring laying on right side at this time. AO x4. Talking in full sentences with regular and unlabored breathing. Side rails up x2, call bell within reach. Daughter at bedside. Awaiting splint placement and enema delivery from pharmacy at this time

## 2020-09-23 NOTE — Telephone Encounter (Signed)
Per chart review tab pt is presently at [facility] ED. 

## 2020-09-23 NOTE — ED Triage Notes (Addendum)
Pt here via Ems from home with complains of LUQ pain with tenderness on palpation. Last BM was last wekk, pt took laxative yesterday with no relief. Pt reports last time she passed gas was yesterday. Today pt was unable to ambulate. Denies any urinary symptoms.  Pt here with daughter.

## 2020-09-23 NOTE — ED Notes (Signed)
Pt TLSO brace applied by specialist, pt states pain in back is tolerable with brace in place, daughter remains back at bedside to take pt home upon dc.

## 2020-09-23 NOTE — Telephone Encounter (Signed)
[facility] - Client TELEPHONE ADVICE RECORD [facility] Patient Name: Carla Kelly Gender: Female DOB: 01/11/1931 Age: 86 Y 38 M 13 D Return Phone Number: [phone] (Primary), [phone] (Secondary) Address: [place] [address] Client [facility] - Client Client Site [facility] Glori Bickers, Roque Lias - MD Contact Type Call Who Is Calling Patient / Member / Family / Caregiver Call Type Triage / Clinical Relationship To Patient Self Return Phone Number [phone] (Primary) Chief Complaint ABDOMINAL PAIN - Severe and only in abdomen Reason for Call Symptomatic / Request for [name] states she has constipation, abdominal pain, bloating, fatigue, for past week, and wondering what to do. [place] Not Listed [place] Translation No Nurse Assessment Nurse: Raphael Gibney, RN, [place] Date/Time (Eastern Time): 09/23/2020 10:40:11 AM Confirm and document reason for call. If symptomatic, describe symptoms. ---Caller states she has constipation with abd pain. Last BM was 5 days ago. has left sided abd pain. Pain level 10. She is very weak. One BM is the past 11 days. Does the patient have any new or worsening symptoms? ---Yes Will a triage be completed? ---Yes Related visit to physician within the last 2 weeks? ---No Does the PT have any chronic conditions? (i.e. diabetes, asthma, this includes High risk factors for pregnancy, etc.) ---No Is this a behavioral health or substance abuse call? ---No Guidelines Guideline Title Affirmed Question Affirmed Notes Nurse Date/Time Eilene Ghazi Time) Abdominal Pain - Female [1] SEVERE pain (e.g., excruciating) AND [2] present > 1 hour Raphael Gibney, RN, Masae 09/23/2020 10:42:30 AM Disp. Time Eilene Ghazi Time) Disposition Final User 09/23/2020 10:38:37 AM Send to Urgent Queue Rosana Fret 09/23/2020 10:46:29 AM  Go to ED Now Yes Raphael Gibney, RN, [name] PLEASE NOTE: All timestamps contained within this report are represented as [place] Standard Time. CONFIDENTIALTY NOTICE: This fax transmission is intended only for the addressee. It contains information that is legally privileged, confidential or otherwise protected from use or disclosure. If you are not the intended recipient, you are strictly prohibited from reviewing, disclosing, copying using or disseminating any of this information or taking any action in reliance on or regarding this information. If you have received this fax in error, please notify us immediately by telephone so that we can arrange for its return to [place]. Phone: [phone], Toll-Free: [phone], Fax: [phone] Page: 2 of 2 Call Id: [id number] Caller Disagree/Comply Comply Caller Understands Yes PreDisposition Call Doctor Care Advice Given Per Guideline GO TO ED NOW: * You need to be seen in the Emergency Department. ANOTHER ADULT SHOULD DRIVE: * It is better and safer if another adult drives instead of you. Referrals GO TO FACILITY OTHER - SPECIFY

## 2020-09-23 NOTE — ED Provider Notes (Signed)
[facility] Emergency Department Provider Note ____________________________________________   First MD Initiated Contact with Patient 09/23/20 1518     (approximate)  I have reviewed the triage vital signs and the nursing notes.  HISTORY  Chief Complaint Abdominal Pain   HPI Carla Kelly is a 86 y.o. femalewho presents to the ED for evaluation of abdominal pain.   Chart review indicates history of hypothyroidism, HLD, Abdominal surgical history of appendectomy, cholecystectomy and hysterectomy.    Patient presents to the ED with her daughter due to LLQ abdominal pain, constipation and generalized weakness.  Patient reports a single bowel movement in the past 10 days, which was 5 days ago she reports it was very "clay like."  She denies hematochezia, melena, nausea, vomiting, emesis.  She reports progressively worsening and constant LLQ abdominal pain, radiating around her left flank, with associated generalized weakness and inability to get out of bed at home.  Currently 9/10 intensity.  She denies urinary changes, hematuria or dysuria.   She further reports a twisting and lifting mechanism with possible injury 2 weeks ago when moving a queen size mattress.  Past Medical History:  Diagnosis Date  . Adenomatous colon polyp   . Asthma   . Depression   . Disorder of bone and cartilage, unspecified   . Embolism and thrombosis of unspecified site   . Fibromyalgia   . History of herpes zoster   . HLD (hyperlipidemia)   . Hypoglycemia   . Hypothyroidism   . IBS (irritable bowel syndrome)   . Mitral valve disorder    "leaking mitral valve-very mild"  . Orbital floor fracture ([place]) 10/16   with a fall   . Osteoarthritis   . Osteoarthrosis, unspecified whether generalized or localized, unspecified site   . Other B-complex deficiencies   . Psoriasis   . Stroke ([place])   . Swelling of extremity, right    right leg  . TGA (transient global amnesia) 01/2014    . Unspecified urinary incontinence   . Unspecified vitamin D deficiency     Patient Active Problem List   Diagnosis Date Noted  . Hearing loss 07/06/2020  . Hammer toe of right foot 03/12/2018  . Screening mammogram, encounter for 03/04/2017  . Routine general medical examination at a health care facility 02/12/2016  . Estrogen deficiency 02/15/2015  . Colon cancer screening 02/15/2015  . Arthritis of right knee 08/13/2014  . Status post total right knee replacement 08/13/2014  . Transient global amnesia 02/20/2014  . Encounter for Medicare annual wellness exam 12/07/2013  . Psoriasis 02/16/2013  . Other screening mammogram 08/19/2012  . Pedal edema 03/11/2012  . B12 deficiency 09/05/2009  . Vitamin D deficiency 11/08/2008  . Hyperlipidemia 08/25/2008  . Osteoporosis 08/25/2008  . History of colonic polyps 08/25/2008  . HERPES ZOSTER, HX OF 06/03/2008  . Hypothyroidism 08/04/2007  . HYPOGLYCEMIA 08/04/2007  . ASTHMA 08/04/2007  . IRRITABLE BOWEL SYNDROME 08/04/2007  . VAGINITIS, ATROPHIC 08/04/2007  . Osteoarthrosis, unspecified whether generalized or localized, unspecified site 08/04/2007  . FIBROMYALGIA 08/04/2007  . URINARY INCONTINENCE 08/04/2007    Past Surgical History:  Procedure Laterality Date  . ABDOMINAL HYSTERECTOMY  1970   fibroids; endometriosis  . APPENDECTOMY    . bladder tack  5/05  . BREAST BIOPSY Right 1995  . BREAST CYST ASPIRATION Left 03/17/2015  . CATARACT EXTRACTION Right 12/13   Dr George Ina  . CATARACT EXTRACTION W/PHACO Left 05/10/2015   Procedure: CATARACT EXTRACTION PHACO AND INTRAOCULAR LENS  PLACEMENT (IOC);  Surgeon: Birder Robson, MD;  Location: [facility] ORS;  Service: Ophthalmology;  Laterality: Left;  [place] 00:59 AP% 26.7 CDE 15.85 fluid pack lot#[id number]  . CERVICAL DISC SURGERY     x 2  . CHOLECYSTECTOMY  2004  . COLONOSCOPY  2/02   polyps, BE-polyps  . EYE SURGERY     right eye  . MOUTH SURGERY     lower teeth have 4 studs to  keep in place  . TONSILLECTOMY    . TOTAL KNEE ARTHROPLASTY Right 08/13/2014   Procedure: RIGHT TOTAL KNEE ARTHROPLASTY;  Surgeon: Mcarthur Rossetti, MD;  Location: [facility] ORS;  Service: Orthopedics;  Laterality: Right;  . TYMPANOPLASTY    . US TRANSVAGINAL PELVIC MODIFIED  10/02   small left ovarian cyst    Prior to Admission medications   Medication Sig Start Date End Date Taking? Authorizing Provider  albuterol (PROVENTIL) 4 MG tablet TAKE 1 TABLET(4 MG) BY MOUTH EVERY 4 HOURS AS NEEDED 07/12/20   Tower, Wynelle Fanny, MD  Apoaequorin (PREVAGEN) 10 MG CAPS Take 1 capsule by mouth daily. 06/30/18   [provider]  Ascorbic Acid (VITAMIN C) 1000 MG tablet Take 1 tablet by mouth daily. 01/25/20   [provider]  aspirin 325 MG tablet Take 325 mg by mouth daily.    [provider]  Calcium Carbonate-Vit D-Min (CALTRATE 600+D PLUS MINERALS) 600-800 MG-UNIT TABS Take 1 capsule by mouth daily. 03/10/18   [provider]  Cholecalciferol (VITAMIN D3 GUMMIES PO) Take 1 tablet by mouth daily. 03/10/18   [provider]  diazepam (VALIUM) 5 MG tablet TAKE 1 TABLET BY MOUTH AT BEDTIME AS NEEDED FOR ANXIETY 09/13/20   Tower, Wynelle Fanny, MD  Fluocinolone Acetonide Scalp (DERMA-SMOOTHE/FS SCALP) 0.01 % OIL Apply to affected area (rash/psoriasis) three times daily as needed 04/14/20   Tower, Wynelle Fanny, MD  furosemide (LASIX) 40 MG tablet Take 0.5 tablets (20 mg total) by mouth 2 (two) times daily. 10/30/19   Tower, Wynelle Fanny, MD  hydrocortisone cream 1 % Apply 1 application topically 2 (two) times daily.    [provider]  mometasone (ELOCON) 0.1 % cream Apply 1 application topically daily. 06/11/19   Tower, Wynelle Fanny, MD  Omega-3 Fatty Acids (FISH OIL) 1000 MG CAPS Take 1 tablet by mouth daily. 11/09/18   [provider]  OVER THE COUNTER MEDICATION [facility]  Fiber + B Vitamins-chew two daily 11/09/18   [provider]  Prenatal Vit-Fe Fumarate-FA (PRENATAL  MULTIVITAMIN/IRON PO) Take 1 tablet by mouth daily. 04/14/17   [provider]  Probiotic Product (ALIGN) 4 MG CAPS Take by mouth every other day.    [provider]  SYNTHROID 88 MCG tablet Take 1 tablet (88 mcg total) by mouth daily before breakfast. 03/30/20   Tower, Wynelle Fanny, MD    Allergies Alcohol-sulfur [sulfur], Ergocalciferol, Morphine, Tolterodine tartrate, Alcohol, Myrbetriq [mirabegron], and Nortriptyline hcl  Family History  Problem Relation Age of Onset  . Heart attack Mother   . Stroke Mother        several  . Lupus Brother   . Other Sister        benign brain tumor  . Asthma Daughter   . Breast cancer Daughter   . Other Brother        heart problems  . Colon cancer Other        1st cousin    Social History Social History   Tobacco Use  . Smoking  status: Never Smoker  . Smokeless tobacco: Never Used  Vaping Use  . Vaping Use: Never used  Substance Use Topics  . Alcohol use: No    Comment: "is allergic"  . Drug use: No    Review of Systems  Constitutional: No fever/chills Eyes: No visual changes. ENT: No sore throat. Cardiovascular: Denies chest pain. Respiratory: Denies shortness of breath. Gastrointestinal:  No nausea, no vomiting.  No diarrhea.  Positive for abdominal pain constipation. Genitourinary: Negative for dysuria. Musculoskeletal: Negative for back pain. Skin: Negative for rash. Neurological: Negative for headaches, focal weakness or numbness.  ____________________________________________   PHYSICAL EXAM:  VITAL SIGNS: Vitals:   09/23/20 2000 09/23/20 2253  BP: (!) 143/132 126/72  Pulse: 90 99  Resp: 18 18  Temp:    SpO2: 96% 95%      Constitutional: Alert and oriented.  Uncomfortable-appearing, but in no acute distress.  Conversational full sentences. Eyes: Conjunctivae are normal. PERRL. EOMI. Head: Atraumatic. Nose: No congestion/rhinnorhea. Mouth/Throat: Mucous membranes are dry.  Oropharynx  non-erythematous. Neck: No stridor. No cervical spine tenderness to palpation. Cardiovascular: Normal rate, regular rhythm. Grossly normal heart sounds.  Good peripheral circulation. Respiratory: Normal respiratory effort.  No retractions. Lungs CTAB. Gastrointestinal: Soft but slightly distended.  LLQ tenderness to palpation with involuntary guarding, vague tenderness to the right-sided quadrants. Musculoskeletal: No lower extremity tenderness nor edema.  No joint effusions. No signs of acute trauma. Neurologic:  Normal speech and language. No gross focal neurologic deficits are appreciated. No gait instability noted. Skin:  Skin is warm, dry and intact. No rash noted. Psychiatric: Mood and affect are normal. Speech and behavior are normal.  ____________________________________________   LABS (all labs ordered are listed, but only abnormal results are displayed)  Labs Reviewed  COMPREHENSIVE METABOLIC PANEL - Abnormal; Notable for the following components:      Result Value   Calcium 8.8 (*)    All other components within normal limits  RESPIRATORY PANEL BY RT PCR (FLU A&B, COVID)  LIPASE, BLOOD  CBC  URINALYSIS, COMPLETE (UACMP) WITH MICROSCOPIC   ____________________________________________  12 Lead EKG  Sinus rhythm, Rate of 93 bpm.  Normal axis and intervals.  No evidence of acute ischemia. ____________________________________________  RADIOLOGY  ED MD interpretation:    Official radiology report(s): CT ABDOMEN PELVIS W CONTRAST  Result Date: 09/23/2020 CLINICAL DATA:  86 year old female with abdominal pain. EXAM: CT ABDOMEN AND PELVIS WITH CONTRAST TECHNIQUE: Multidetector CT imaging of the abdomen and pelvis was performed using the standard protocol following bolus administration of intravenous contrast. CONTRAST:  166mL OMNIPAQUE IOHEXOL 300 MG/ML  SOLN COMPARISON:  CT abdomen pelvis dated 09/01/2018. FINDINGS: Lower chest: Bibasilar dependent atelectasis. No  intra-abdominal free air or free fluid. Hepatobiliary: The liver is unremarkable. There is dilatation of the intrahepatic and extrahepatic biliary tree as seen previously. The common bile duct measures approximately 11 mm in diameter. No retained calcified stone noted in the central CBD. Cholecystectomy. Pancreas: Unremarkable. No pancreatic ductal dilatation or surrounding inflammatory changes. Spleen: Normal in size without focal abnormality. Adrenals/Urinary Tract: The adrenal glands unremarkable. There is no hydronephrosis on either side. There is symmetric enhancement and excretion of contrast by both kidneys. Small bilateral renal cysts. The visualized ureters and urinary bladder appear unremarkable. Stomach/Bowel: There is a small hiatal hernia. There is large amount of stool throughout the colon. There is no bowel obstruction or active inflammation. Appendectomy. Vascular/Lymphatic: Advanced aortoiliac atherosclerotic disease. The IVC is unremarkable. No portal venous gas. There is no adenopathy.  Reproductive: Hysterectomy. No adnexal masses. Other: None Musculoskeletal: Osteopenia with multilevel degenerative changes of the spine. There is compression fracture of the L1 vertebra with approximately 30-40% loss of vertebral body height, likely acute. There is lucency within the L1 vertebra which may be related to osteopenia or represent a lucent lesion. There are fractures of the superior and inferior endplates of L1 as well as extension of the fracture into the anterior and possibly posterior cortex. There is 3 mm retropulsion of the superior posterior cortex. There is perivertebral edema or hematoma at L1. There is fracture of the right L1 transverse process. Chronic pain compression fracture of L3. Multilevel disc desiccation and vacuum phenomena. IMPRESSION: 1. Acute appearing compression fracture of the L1 vertebra with approximately 30-40% loss of vertebral body height. There is 3 mm retropulsion of the  superior posterior cortex. 2. Constipation. No bowel obstruction. 3. Aortic Atherosclerosis (ICD10-I70.0). Electronically Signed   By: Anner Crete M.D.   On: 09/23/2020 18:09    ____________________________________________   PROCEDURES and INTERVENTIONS  Procedure(s) performed (including Critical Care):  .1-3 Lead EKG Interpretation Performed by: Vladimir Crofts, MD Authorized by: Vladimir Crofts, MD     Interpretation: normal     ECG rate:  88   ECG rate assessment: normal     Rhythm: sinus rhythm     Ectopy: none     Conduction: normal      Medications  lactated ringers bolus 1,000 mL (0 mLs Intravenous Stopped 09/23/20 2326)  morphine 4 MG/ML injection 4 mg (4 mg Intravenous Given 09/23/20 1655)  ondansetron (ZOFRAN) injection 4 mg (4 mg Intravenous Given 09/23/20 1657)  iohexol (OMNIPAQUE) 300 MG/ML solution 100 mL (100 mLs Intravenous Contrast Given 09/23/20 1743)  polyethylene glycol (MIRALAX / GLYCOLAX) packet 34 g (34 g Oral Given 09/23/20 2030)  sorbitol, milk of mag, mineral oil, glycerin (SMOG) enema (960 mLs Rectal Given 09/23/20 2024)    ____________________________________________   MDM / ED COURSE  Pleasant 86 year old woman presents from home with 10 days of left-sided abdominal pain, consistent with constipation superimposed upon an acute L1 compression fracture, ultimately amenable to TLSO bracing, fecal disimpaction and outpatient management.  Normal vital signs and room air.  Exam initially concerning for SBO with LLQ tenderness to palpation with associated involuntary guarding.  Blood work is reassuring without significant derangements.  CT imaging obtained to rule out SBO, and does not show evidence of such but does demonstrate evidence of constipation and an acute L1 compression fracture.  Patient has no neurologic deficits or indications for emergent neurosurgical intervention or consultation.  Patient was fitted for TLSO brace while in the ED, with  subsequently improving pain.  Fecal disimpaction attempted by me does not demonstrate impaction, so patient was provided an enema and MiraLAX with subsequent passage of bowel movement and improving symptoms.  We discussed following up with PCP and neurosurgery.  We discussed return precautions for the ED.  Patient is medically stable for discharge home with her daughter.  Clinical Course as of Sep 24 38  Fri Sep 23, 2020  1901 Reassessed.  Educated patient and daughter at the bedside about CT results demonstrating acute lumbar fracture, and no evidence of SBO.   [DS]  1903 Digital rectal exam performed without evidence of stool impaction.  Nurse places enema and provides MiraLAX at this time.   [DS]  1949 Reassessed.  Patient actually has not been provided enema or MiraLAX yet.  Daytime nurse failed to provide this before leaving at the  end of her shift.  I touched base with the new RN providing care and educated her of the plan for bracing and passage of bowel movement before likely discharge home.   [DS]  2203 Orthotist approaches me and tells me that patient has been fitted with her TLSO brace and reports improved pain.  She has been educated and daughter has been educated on use   [DS]    Clinical Course User Index [DS] Vladimir Crofts, MD     ____________________________________________   FINAL CLINICAL IMPRESSION(S) / ED DIAGNOSES  Final diagnoses:  LLQ abdominal pain  Other constipation  Compression fracture of L1 vertebra, initial encounter [facility])     ED Discharge Orders    None       Donte Lenzo   Note:  This document was prepared using Dragon voice recognition software and may include unintentional dictation errors.   Vladimir Crofts, MD 09/24/20 [phone]

## 2020-09-23 NOTE — Progress Notes (Signed)
Orthopedic Tech Progress Note Patient Details:  [name] 1933-04-25 [id number] Called order into [facility] for TLSO. Patient ID: Carla Kelly, female   DOB: March 25, 1933, 86 y.o.   MRN: [id number]   Chip Boer 09/23/2020, 7:14 PM

## 2020-09-23 NOTE — ED Triage Notes (Signed)
Pt comes into the ED via [facility] from home c/o LUQ pain with tenderness upon palpation.  Pt hasn't had a BM in a week.  Pt also has reduced mobility with ambulation.  Pt normally able to ambulate independently, but not as well today. Pt has been using laxatives with no help. VSS.

## 2020-09-23 NOTE — ED Notes (Signed)
DC reviewed by provider, denies needs or concerns. AO x4. Pt aware of need to follow up with neurologist. Pt daughter taught by RN how to remove brace for pt sleep and shower. RN wheeled pt out of ED and assisted to daughter's vehicle for departure home. AO x4. Talking in full sentences with regular and unlabored breathing

## 2020-09-23 NOTE — Telephone Encounter (Signed)
Pt called to report that she has been having abd bloating and feeling weak along with constipation x 10 days with a small BM x 5 days ago... pt has tried OTC x 2 days of laxative with no relief... Pt transferred to access nurse for further triage

## 2020-09-26 ENCOUNTER — Telehealth: Payer: Self-pay | Admitting: *Deleted

## 2020-09-26 DIAGNOSIS — S32010A Wedge compression fracture of first lumbar vertebra, initial encounter for closed fracture: Secondary | ICD-10-CM | POA: Insufficient documentation

## 2020-09-26 DIAGNOSIS — S32010S Wedge compression fracture of first lumbar vertebra, sequela: Secondary | ICD-10-CM

## 2020-09-26 NOTE — Telephone Encounter (Signed)
Just FYI I spoke with patient's daughter & they are starting the process of checking out what facilities they think would be best fit for patient. She is also going to check with patient's insurance. She knows also to be on the lookout for a call from [facility]. She will keep you updated & in the loop about patient's care.

## 2020-09-26 NOTE — Telephone Encounter (Signed)
Thanks for letting me know  I assume she is having pain - so do alert me if they need help with the referral (I see orders in the chart already)  For the constipation -please take miralax every day as directed (this is safe and can prevent more problems)   Keep me posted please- glad that she is dong a little better

## 2020-09-26 NOTE — Telephone Encounter (Signed)
Patient's daughter HPOA left a voicemail stating that she had to take her mom to the ER Friday because she was having abdominal and back pain. Mickel Baas stated her mom had a bowel impactment and they took care of that at the ER. Mickel Baas stated that they also found on a CT scan that her mom had a compression fracture. Patient's daughter stated that she is trying to schedule her mom with [facility] that was recommended by the hospital. Mickel Baas stated that you can call her back or her mom with any suggestions.

## 2020-09-26 NOTE — Telephone Encounter (Signed)
I put a referral in to [facility] to see if they could help them out with social work/ other care   Direct admissions can only be done from the hospital. The family needs to start calling some facilities to see where she may want to go and then I will do any paperwork needed   [place]- do you think a home care consult would be appropriate for now?   I doubt she has skilled nursing needs right now but perhaps PT (limited in light of L1 compression fracture)   Thanks  Sending to [employer]

## 2020-09-26 NOTE — Telephone Encounter (Signed)
I spoke with patient's daughter advise the miralax. Pt's daughter went on to say that her mom is staying with her at the moment & she requires constant assistance. She only has a one bedroom home & works full time. She said that her brother is sitting with patient during the day. She was wondering if somehow patient could go to a rehab facility or some type of assisted living until she healing from fracture. She said that she does not have the skills needed to care for her mother or the resources. Pt is currently in a hospital bed that patient's daughter had from a few years ago & is set up in her bedroom with her.  She plans to call patient's insurance to see what all is covered, but she was seeking advice on what to do?

## 2020-09-27 ENCOUNTER — Other Ambulatory Visit: Payer: Self-pay | Admitting: *Deleted

## 2020-09-27 NOTE — Patient Outreach (Addendum)
[facility]) Care Management  09/27/2020  Carla Kelly October 30, 1933 [id number]  Initial telephone outreach for possible LTC placement.  Called daughter, Carla Kelly, left message to return my call.  Called and spoke to Carla Kelly who was able to give permission to participate with Carla Kelly care management services. She gave me a thorough hx.  She sustained a L1 compression fx several weeks ago and her mobility is very limited now. She has a torso brace. She is incontinent of bowel and bladder. She can get out of bed with assistance and walk with a walker with stand by assist. She needs help with dressing and bathing.  She has moved into her daughter and son-in-laws home, but states this is temporary.  She has a Games developer.  She requested I speak to her daughter and Np advised her I have called and left a message. The 3 of [place] will work together to find some solutions for her.  Outpatient Encounter Medications as of 09/27/2020  Medication Sig Note   albuterol (PROVENTIL) 4 MG tablet TAKE 1 TABLET(4 MG) BY MOUTH EVERY 4 HOURS AS NEEDED    Apoaequorin (PREVAGEN) 10 MG CAPS Take 1 capsule by mouth daily.    Ascorbic Acid (VITAMIN C) 1000 MG tablet Take 1 tablet by mouth daily.    aspirin 325 MG tablet Take 325 mg by mouth daily.    diazepam (VALIUM) 5 MG tablet TAKE 1 TABLET BY MOUTH AT BEDTIME AS NEEDED FOR ANXIETY    hydrocortisone cream 1 % Apply 1 application topically 2 (two) times daily.    Omega-3 Fatty Acids (FISH OIL) 1000 MG CAPS Take 1 tablet by mouth daily.    OVER THE COUNTER MEDICATION [facility]  Fiber + B Vitamins-chew two daily    Probiotic Product (ALIGN) 4 MG CAPS Take by mouth every other day.    SYNTHROID 88 MCG tablet Take 1 tablet (88 mcg total) by mouth daily before breakfast.    Calcium Carbonate-Vit D-Min (CALTRATE 600+D PLUS MINERALS) 600-800 MG-UNIT TABS Take 1 capsule by mouth daily. (Patient not taking: Reported on 09/27/2020)     Cholecalciferol (VITAMIN D3 GUMMIES PO) Take 1 tablet by mouth daily. (Patient not taking: Reported on 09/27/2020)    Fluocinolone Acetonide Scalp (DERMA-SMOOTHE/FS SCALP) 0.01 % OIL Apply to affected area (rash/psoriasis) three times daily as needed (Patient not taking: Reported on 09/27/2020)    furosemide (LASIX) 40 MG tablet Take 0.5 tablets (20 mg total) by mouth 2 (two) times daily. 09/27/2020: Only takes as needed for R leg swelling.   mometasone (ELOCON) 0.1 % cream Apply 1 application topically daily. (Patient not taking: Reported on 09/27/2020)    [DISCONTINUED] Prenatal Vit-Fe Fumarate-FA (PRENATAL MULTIVITAMIN/IRON PO) Take 1 tablet by mouth daily. (Patient not taking: Reported on 09/27/2020)    No facility-administered encounter medications on file as of 09/27/2020.   Fall Risk  07/06/2020 03/06/2018 02/21/2017 02/21/2016 02/21/2016  Falls in the past year? 0 No No (No Data) Yes  Comment - - - 08/2015 fall with injury; right orbital floor fracture with laceration -  Number falls in past yr: - - - - 1  Injury with Fall? - - - - Yes  Follow up Falls evaluation completed - - - Falls evaluation completed   Depression screen [facility] 2/9 07/06/2020 03/06/2018 02/21/2017 02/21/2016 02/21/2016  Decreased Interest 0 0 0 0 0  Down, Depressed, Hopeless 0 1 1 0 0  PHQ - 2 Score 0 1 1 0 0  Altered sleeping - 0 0 - -  Tired, decreased energy - 0 3 - -  Change in appetite - 0 0 - -  Feeling bad or failure about yourself  - 0 0 - -  Trouble concentrating - 0 0 - -  Moving slowly or fidgety/restless - 0 0 - -  Suicidal thoughts - 0 1 - -  PHQ-9 Score - 1 5 - -  Difficult doing work/chores - Not difficult at all Somewhat difficult - -    Copeland Lapier C. [name], MSN, [facility] Gerontological Nurse Practitioner [facility] Care Management [phone]  09/28/20 Additional documentation. Talked to Carla Kelly, pt daughter who verifies all reported information from her mother. Mickel Baas says that they are not considering  LTC placement. They have rearranged their home to accomodate her mother in a room to herself. Mickel Baas will be working at home while her mother is convalescing. She does not need caregiving assistance nor to access her mother's LTC insurance. Her concerns are 1) Is the brace that was provided in the ED the appropriate size, is there a way to clean it if it becomes saturated with urine or to prevent it from becoming saturated? Should her mother be getting PT? Are there exercises she can be doing without PT? Is there a solution to catching her mother's urine to prevent her total saturation of bed and the back brace? Mickel Baas will ask the prosthetist tomorrow about the questions concerning the brace. She will also call the orthopedic specialist about exercise. NP to find out if purewick can be covered by [facility].  NP to call next week to follow up. Will advise Mickel Baas if purewick can be covered or where to purechase this out of pocket.  [name]. [name], MSN, [place] Gerontological Nurse Practitioner [facility] Care Management [phone]

## 2020-09-28 ENCOUNTER — Telehealth: Payer: Self-pay

## 2020-09-28 DIAGNOSIS — N3945 Continuous leakage: Secondary | ICD-10-CM

## 2020-09-28 NOTE — Telephone Encounter (Signed)
I ordered it and the print out is in the IN box on Shapale's desk Thank you

## 2020-09-28 NOTE — Telephone Encounter (Signed)
[place] Night - Client Nonclinical Telephone Record AccessNurse Client [place] Night - Client Client Site [place] Physician Loura Pardon - MD Contact Type Call Who Is Calling Patient / Member / Family / Caregiver Caller Name Lilliam Chamblee Caller Phone Number [phone] or [phone] Patient Name Diannia Hogenson Patient DOB 1933-03-19 Call Type Message Only Information Provided Reason for Call Request to Reschedule Office Appointment Initial Comment Caller states she would like to reschedule appointment time Disp. Time Disposition Final User 09/27/2020 5:30:33 PM General Information Provided Yes Ignacia Marvel Call Closed By: Ignacia Marvel Transaction Date/Time: 09/27/2020 5:27:38 PM (ET)

## 2020-09-28 NOTE — Telephone Encounter (Signed)
I spoke to patient's daughter and she changed appointment to 10/03/20 at 9:30.

## 2020-09-28 NOTE — Telephone Encounter (Signed)
Mat Carne DPR signed left v/m that Mickel Baas was on the phone with pts ins co and had been advised that ins would pay for PureWick female external catheter system if ordered as durable medical equipment. Mickel Baas request cb if [place] system is ordered and if Mickel Baas needs to pick up the rx.  Sending note to DR [employer].

## 2020-09-28 NOTE — Telephone Encounter (Signed)
Forwarding information to [facility] care coordinator to request information on what step the referral process is in.

## 2020-09-29 NOTE — Telephone Encounter (Signed)
Hello Carla Kelly - the [place] has a visit scheduled with this patient on 11/11 and will forward her note after her visit. Please let me know if I can help with anything else. Thank you.

## 2020-09-30 NOTE — Telephone Encounter (Signed)
Please get the prior auth I have not been able to get this covered in the past

## 2020-09-30 NOTE — Telephone Encounter (Signed)
Spoke with daughter Virginia Rochester. And she is going to see what DME store carries them and then call us back and let us know so we can fax the order directly to the DME store.

## 2020-09-30 NOTE — Telephone Encounter (Signed)
Patient's daughter Mickel Baas left a voicemail stating that she has found out that medicare will not pay for the [facility] without Dr. Glori Bickers doing a  PA to make sure that patient qualifies for this. Mickel Baas stated that the telephone number to call to submit the PA is [phone] opt 6. Mickel Baas stated that only one supplier is contracted with Medicare for this equipment.

## 2020-10-03 ENCOUNTER — Encounter: Payer: Self-pay | Admitting: Family Medicine

## 2020-10-03 ENCOUNTER — Ambulatory Visit: Payer: Medicare Other | Admitting: Family Medicine

## 2020-10-03 ENCOUNTER — Ambulatory Visit (INDEPENDENT_AMBULATORY_CARE_PROVIDER_SITE_OTHER): Payer: Medicare Other | Admitting: Family Medicine

## 2020-10-03 VITALS — BP 140/78 | HR 87 | Temp 97.4°F | Ht 66.0 in | Wt 157.0 lb

## 2020-10-03 DIAGNOSIS — Z23 Encounter for immunization: Secondary | ICD-10-CM

## 2020-10-03 DIAGNOSIS — K5900 Constipation, unspecified: Secondary | ICD-10-CM | POA: Diagnosis not present

## 2020-10-03 DIAGNOSIS — M810 Age-related osteoporosis without current pathological fracture: Secondary | ICD-10-CM

## 2020-10-03 DIAGNOSIS — S32010S Wedge compression fracture of first lumbar vertebra, sequela: Secondary | ICD-10-CM

## 2020-10-03 NOTE — Progress Notes (Signed)
Subjective:    Patient ID: Carla Kelly, female    DOB: 1933-03-05, 86 y.o.   MRN: [id number]  This visit occurred during the SARS-CoV-2 public health emergency.  Safety protocols were in place, including screening questions prior to the visit, additional usage of staff PPE, and extensive cleaning of exam room while observing appropriate contact time as indicated for disinfecting solutions.    HPI Pt presents for f/u of ER visit and also for flu shot    She was seen in ER for LLQ abd pain on 10/29  (also constipation and gen weakness)   Noted to hae constipation and acute L1 compression fracture She was fitted for TLSO brace while in ER  Fecal disimpaction attempted and miralax enema -helpful   Lab Results  Component Value Date   CREATININE 0.66 09/23/2020   BUN 17 09/23/2020   NA 139 09/23/2020   K 3.7 09/23/2020   CL 106 09/23/2020   CO2 23 09/23/2020   Lab Results  Component Value Date   ALT 19 09/23/2020   AST 26 09/23/2020   ALKPHOS 88 09/23/2020   BILITOT 0.9 09/23/2020   calcium level was low at 8.8 Lab Results  Component Value Date   WBC 8.4 09/23/2020   HGB 12.4 09/23/2020   HCT 36.7 09/23/2020   MCV 92.7 09/23/2020   PLT 235 09/23/2020   Glucose 97 Neg flu and covid tests   CT done CT ABDOMEN PELVIS W CONTRAST  Result Date: 09/23/2020 CLINICAL DATA:  86 year old female with abdominal pain. EXAM: CT ABDOMEN AND PELVIS WITH CONTRAST TECHNIQUE: Multidetector CT imaging of the abdomen and pelvis was performed using the standard protocol following bolus administration of intravenous contrast. CONTRAST:  158mL OMNIPAQUE IOHEXOL 300 MG/ML  SOLN COMPARISON:  CT abdomen pelvis dated 09/01/2018. FINDINGS: Lower chest: Bibasilar dependent atelectasis. No intra-abdominal free air or free fluid. Hepatobiliary: The liver is unremarkable. There is dilatation of the intrahepatic and extrahepatic biliary tree as seen previously. The common bile duct measures approximately  11 mm in diameter. No retained calcified stone noted in the central CBD. Cholecystectomy. Pancreas: Unremarkable. No pancreatic ductal dilatation or surrounding inflammatory changes. Spleen: Normal in size without focal abnormality. Adrenals/Urinary Tract: The adrenal glands unremarkable. There is no hydronephrosis on either side. There is symmetric enhancement and excretion of contrast by both kidneys. Small bilateral renal cysts. The visualized ureters and urinary bladder appear unremarkable. Stomach/Bowel: There is a small hiatal hernia. There is large amount of stool throughout the colon. There is no bowel obstruction or active inflammation. Appendectomy. Vascular/Lymphatic: Advanced aortoiliac atherosclerotic disease. The IVC is unremarkable. No portal venous gas. There is no adenopathy. Reproductive: Hysterectomy. No adnexal masses. Other: None Musculoskeletal: Osteopenia with multilevel degenerative changes of the spine. There is compression fracture of the L1 vertebra with approximately 30-40% loss of vertebral body height, likely acute. There is lucency within the L1 vertebra which may be related to osteopenia or represent a lucent lesion. There are fractures of the superior and inferior endplates of L1 as well as extension of the fracture into the anterior and possibly posterior cortex. There is 3 mm retropulsion of the superior posterior cortex. There is perivertebral edema or hematoma at L1. There is fracture of the right L1 transverse process. Chronic pain compression fracture of L3. Multilevel disc desiccation and vacuum phenomena. IMPRESSION: 1. Acute appearing compression fracture of the L1 vertebra with approximately 30-40% loss of vertebral body height. There is 3 mm retropulsion of the superior posterior cortex.  2. Constipation. No bowel obstruction. 3. Aortic Atherosclerosis (ICD10-I70.0). Electronically Signed   By: Anner Crete M.D.   On: 09/23/2020 18:09   Known OP Last dexa 6/18 and  declined another  D level good at 66.4 in august   Ref was made to neurosurg to help with compression fracture We placed a [facility] care ref to help family with home care/adv planning   Was interested in pure wik cath system to help with urinary incontinence while recovering   Using the brace now- is starting to heal Pain is starting to improve  Takes 1000 mg of tylenol four times daily   Constipation is better- bm twice daily  Has not had to use miralax  Appetite is very good   Daughter is taking care of her  She is able to work at home  Has a hospital bed - very helpful  Able to do more on her own  Doing some leg and foot lifts/raises  [facility]- f/u mid month     (also went to [employer] [facility] ortho to adj brace)   Getting up more than three times daily now- happy about that    Has wheel chair and walker and shower chair from family   In retrospect she thinks that painting a deck and flipping mattress might have done it   Patient Active Problem List   Diagnosis Date Noted  . Constipation 10/03/2020  . Closed compression fracture of L1 vertebra ([place]) 09/26/2020  . Hearing loss 07/06/2020  . Hammer toe of right foot 03/12/2018  . Screening mammogram, encounter for 03/04/2017  . Routine general medical examination at a health care facility 02/12/2016  . Estrogen deficiency 02/15/2015  . Colon cancer screening 02/15/2015  . Arthritis of right knee 08/13/2014  . Status post total right knee replacement 08/13/2014  . Transient global amnesia 02/20/2014  . Encounter for Medicare annual wellness exam 12/07/2013  . Psoriasis 02/16/2013  . Other screening mammogram 08/19/2012  . Pedal edema 03/11/2012  . B12 deficiency 09/05/2009  . Vitamin D deficiency 11/08/2008  . Hyperlipidemia 08/25/2008  . Osteoporosis 08/25/2008  . History of colonic polyps 08/25/2008  . HERPES ZOSTER, HX OF 06/03/2008  . Hypothyroidism 08/04/2007  . HYPOGLYCEMIA 08/04/2007  . ASTHMA 08/04/2007  .  IRRITABLE BOWEL SYNDROME 08/04/2007  . VAGINITIS, ATROPHIC 08/04/2007  . Osteoarthrosis, unspecified whether generalized or localized, unspecified site 08/04/2007  . FIBROMYALGIA 08/04/2007  . URINARY INCONTINENCE 08/04/2007   Past Medical History:  Diagnosis Date  . Adenomatous colon polyp   . Asthma   . Depression   . Disorder of bone and cartilage, unspecified   . Embolism and thrombosis of unspecified site   . Fibromyalgia   . History of herpes zoster   . HLD (hyperlipidemia)   . Hypoglycemia   . Hypothyroidism   . IBS (irritable bowel syndrome)   . Mitral valve disorder    "leaking mitral valve-very mild"  . Orbital floor fracture ([place]) 10/16   with a fall   . Osteoarthritis   . Osteoarthrosis, unspecified whether generalized or localized, unspecified site   . Other B-complex deficiencies   . Psoriasis   . Stroke ([place])   . Swelling of extremity, right    right leg  . 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 Unspecified urinary incontinence   . Unspecified vitamin D deficiency    Past Surgical History:  Procedure Laterality Date  . ABDOMINAL HYSTERECTOMY  1970   fibroids; endometriosis  . APPENDECTOMY    .  bladder tack  5/05  . BREAST BIOPSY Right 1995  . BREAST CYST ASPIRATION Left 03/17/2015  . CATARACT EXTRACTION Right 12/13   Dr George Ina  . CATARACT EXTRACTION W/PHACO Left 05/10/2015   Procedure: CATARACT EXTRACTION PHACO AND INTRAOCULAR LENS PLACEMENT (IOC);  Surgeon: Birder Robson, MD;  Location: [facility] ORS;  Service: Ophthalmology;  Laterality: Left;  [place] 00:59 AP% 26.7 CDE 15.85 fluid pack lot#[id number]  . CERVICAL DISC SURGERY     x 2  . CHOLECYSTECTOMY  2004  . COLONOSCOPY  2/02   polyps, BE-polyps  . EYE SURGERY     right eye  . MOUTH SURGERY     lower teeth have 4 studs to keep in place  . TONSILLECTOMY    . TOTAL KNEE ARTHROPLASTY Right 08/13/2014   Procedure: RIGHT TOTAL KNEE ARTHROPLASTY;  Surgeon: Mcarthur Rossetti, MD;  Location: [facility]  ORS;  Service: Orthopedics;  Laterality: Right;  . TYMPANOPLASTY    . US TRANSVAGINAL PELVIC MODIFIED  10/02   small left ovarian cyst   Social History   Tobacco Use  . Smoking status: Never Smoker  . Smokeless tobacco: Never Used  Vaping Use  . Vaping Use: Never used  Substance Use Topics  . Alcohol use: No    Comment: "is allergic"  . Drug use: No   Family History  Problem Relation Age of Onset  . Heart attack Mother   . Stroke Mother        several  . Lupus Brother   . Other Sister        benign brain tumor  . Asthma Daughter   . Breast cancer Daughter   . Other Brother        heart problems  . Colon cancer Other        1st cousin   Allergies  Allergen Reactions  . Alcohol-Sulfur [Sulfur] Shortness Of Breath and Swelling    Alcohol po swells throat  . Ergocalciferol Other (See Comments)    REACTION: GI side effects  . Morphine Nausea And Vomiting  . Tolterodine Tartrate Other (See Comments)    REACTION: ? reaction  . Alcohol Other (See Comments) and Swelling  . Myrbetriq [Mirabegron] Other (See Comments)    GI Upset  . Nortriptyline Hcl Other (See Comments)    unknown   Current Outpatient Medications on File Prior to Visit  Medication Sig Dispense Refill  . acetaminophen (TYLENOL) 500 MG tablet Take 1,000 mg by mouth 3 (three) times daily.    Marland Kitchen albuterol (PROVENTIL) 4 MG tablet TAKE 1 TABLET(4 MG) BY MOUTH EVERY 4 HOURS AS NEEDED 30 tablet 5  . Apoaequorin (PREVAGEN) 10 MG CAPS Take 1 capsule by mouth daily.    . Ascorbic Acid (VITAMIN C) 1000 MG tablet Take 1 tablet by mouth daily.    . Calcium Carbonate-Vit D-Min (CALTRATE 600+D PLUS MINERALS) 600-800 MG-UNIT TABS Take 1 capsule by mouth daily.     . diazepam (VALIUM) 5 MG tablet TAKE 1 TABLET BY MOUTH AT BEDTIME AS NEEDED FOR ANXIETY 30 tablet 3  . furosemide (LASIX) 40 MG tablet Take 0.5 tablets (20 mg total) by mouth 2 (two) times daily. 30 tablet 11  . hydrocortisone cream 1 % Apply 1 application  topically 2 (two) times daily.    . Omega-3 Fatty Acids (FISH OIL) 1000 MG CAPS Take 1 tablet by mouth daily.    Marland Kitchen OVER THE COUNTER MEDICATION [facility]  Fiber + B Vitamins-chew two daily    . Probiotic  Product (ALIGN) 4 MG CAPS Take by mouth every other day.    Marland Kitchen SYNTHROID 88 MCG tablet Take 1 tablet (88 mcg total) by mouth daily before breakfast. 90 tablet 2  . aspirin 325 MG tablet Take 325 mg by mouth daily. (Patient not taking: Reported on 10/03/2020)    . Cholecalciferol (VITAMIN D3 GUMMIES PO) Take 1 tablet by mouth daily. (Patient not taking: Reported on 10/03/2020)    . Fluocinolone Acetonide Scalp (DERMA-SMOOTHE/FS SCALP) 0.01 % OIL Apply to affected area (rash/psoriasis) three times daily as needed (Patient not taking: Reported on 09/27/2020) 118 mL 1  . mometasone (ELOCON) 0.1 % cream Apply 1 application topically daily. (Patient not taking: Reported on 09/27/2020) 45 g 1   No current facility-administered medications on file prior to visit.    Review of Systems  Constitutional: Negative for activity change, appetite change, fatigue, fever and unexpected weight change.  HENT: Negative for congestion, ear pain, rhinorrhea, sinus pressure and sore throat.   Eyes: Negative for pain, redness and visual disturbance.  Respiratory: Negative for cough, shortness of breath and wheezing.   Cardiovascular: Negative for chest pain and palpitations.  Gastrointestinal: Negative for abdominal pain, blood in stool, constipation and diarrhea.       Constipation is better   Endocrine: Negative for polydipsia and polyuria.  Genitourinary: Negative for dysuria, frequency and urgency.  Musculoskeletal: Positive for back pain. Negative for arthralgias and myalgias.  Skin: Negative for pallor and rash.  Allergic/Immunologic: Negative for environmental allergies.  Neurological: Negative for dizziness, syncope and headaches.  Hematological: Negative for adenopathy. Does not bruise/bleed easily.    Psychiatric/Behavioral: Negative for decreased concentration and dysphoric mood. The patient is not nervous/anxious.        Objective:   Physical Exam Constitutional:      General: She is not in acute distress.    Appearance: Normal appearance. She is well-developed and normal weight. She is not ill-appearing.     Comments: Frail appearing in back brace today  HENT:     Head: Normocephalic and atraumatic.  Eyes:     General: No scleral icterus.    Conjunctiva/sclera: Conjunctivae normal.     Pupils: Pupils are equal, round, and reactive to light.  Neck:     Thyroid: No thyromegaly.     Vascular: No carotid bruit or JVD.  Cardiovascular:     Rate and Rhythm: Normal rate and regular rhythm.     Heart sounds: Normal heart sounds. No gallop.   Pulmonary:     Effort: Pulmonary effort is normal. No respiratory distress.     Breath sounds: Normal breath sounds. No wheezing or rales.  Abdominal:     General: Bowel sounds are normal. There is no distension or abdominal bruit.     Palpations: Abdomen is soft. There is no mass.     Tenderness: There is no abdominal tenderness.     Comments: Limited exam, sitting in back brace  Musculoskeletal:     Cervical back: Normal range of motion and neck supple. No tenderness.     Right lower leg: No edema.     Left lower leg: No edema.     Comments: Limited rom of spine In brace for L1 compression fracture   Kyphosis   Lymphadenopathy:     Cervical: No cervical adenopathy.  Skin:    General: Skin is warm and dry.     Findings: No erythema or rash.     Comments: Fair complexion    Neurological:  Mental Status: She is alert.     Cranial Nerves: No cranial nerve deficit.     Deep Tendon Reflexes: Reflexes are normal and symmetric.     Comments: req asst with ambulation due to spinal comp fracture   Psychiatric:        Mood and Affect: Mood normal.     Comments: Good mood and positive outlook today  Mentally sharp  Talkative             Assessment & Plan:   Problem List Items Addressed This Visit      Musculoskeletal and Integument   Osteoporosis    Pt declined dexa and tx in the past but now with L1 compression fx is open to it  Once healed will get dexa and discuss treatment Good diet and vit D intake        Closed compression fracture of L1 vertebra (H[facility]) - Primary    Pain is starting to let up (taking tylenol) and wearing brace which she was able to adjust  Family is helping with care and doing a great job  [facility] mid month -at that time we may get a better idea of if/when PT may be ok  Pt thinks she had injury painting a deck (no fall however)  She is agreeable to dexa and tx for OP once healed         Other   Constipation    This is resolved after miralax and enema in the ER Has not req further medication but knows miralax is available if needed  Reviewed hospital records, lab results and studies in detail   Rev CT scan report  Very reassuring labs       Other Visit Diagnoses    Need for influenza vaccination       Relevant Orders   Flu Vaccine QUAD High Dose(Fluad) (Completed)

## 2020-10-03 NOTE — Patient Instructions (Addendum)
Eat a healthy balanced diet  Gradually increase activity as tolerated   Follow up with [facility] clinic as planned   Once you are healed I would like to schedule a bone density test and discuss options for osteoporosis treatment   miralax is ok as needed for constipation   Flu shot today   Get a covid booster when you are ready

## 2020-10-03 NOTE — Assessment & Plan Note (Signed)
Pt declined dexa and tx in the past but now with L1 compression fx is open to it  Once healed will get dexa and discuss treatment Good diet and vit D intake

## 2020-10-03 NOTE — Assessment & Plan Note (Signed)
This is resolved after miralax and enema in the ER Has not req further medication but knows miralax is available if needed  Reviewed hospital records, lab results and studies in detail   Rev CT scan report  Very reassuring labs

## 2020-10-03 NOTE — Assessment & Plan Note (Signed)
Pain is starting to let up (taking tylenol) and wearing brace which she was able to adjust  Family is helping with care and doing a great job  [facility] mid month -at that time we may get a better idea of if/when PT may be ok  Pt thinks she had injury painting a deck (no fall however)  She is agreeable to dexa and tx for OP once healed

## 2020-10-05 ENCOUNTER — Ambulatory Visit: Payer: Self-pay | Admitting: *Deleted

## 2020-10-06 NOTE — Telephone Encounter (Signed)
Spoke with [employer]) and started this request, they said we have to fax over last OV note. OV note and order sent to [facility], I will await a response

## 2020-10-09 ENCOUNTER — Encounter: Payer: Self-pay | Admitting: Family Medicine

## 2020-10-10 ENCOUNTER — Other Ambulatory Visit: Payer: Self-pay | Admitting: *Deleted

## 2020-10-10 NOTE — Patient Outreach (Signed)
[facility]) Care Management  10/10/2020  YONA KOSEK [birth year] [id number]  Telephone outreach.  Talked with pt daughter, Mat Carne. Advised that I had missed the appt last week and would like to do her mother's assessment today, which she agreed to.  She says her mother is progressing well. She is able to most of her self care herself now except for bathing. She is ambulating with a traditional walker. Mickel Baas voices concerns about her positioning when walking. Educated on desired positioning when using the walker, so balance is optimal and posture is upright and not leaning forward with out-stretched arms.   Her appetite is excellent. She asks for prn Miralax if she needs it with good results. She is able to change her wet incontinence garments herself.   They did go to the prosthetist and he did verify the brace is the appropriate size and that under the arm is appropriate for her. Advised she may take the brace off for one hour each day.  Her pain is diminishing. She had been taking 4000 mg APAP a day. Educated that older adults should not take more than 3000 mg APAP a day. Only take it when necessary. Try 500 mg 3-4 times a day if needed.  She will see the orthopedist tomorrow.  Will we talk again in 2 weeks.

## 2020-10-11 DIAGNOSIS — R0781 Pleurodynia: Secondary | ICD-10-CM | POA: Diagnosis not present

## 2020-10-11 DIAGNOSIS — S32018A Other fracture of first lumbar vertebra, initial encounter for closed fracture: Secondary | ICD-10-CM | POA: Diagnosis not present

## 2020-10-12 ENCOUNTER — Other Ambulatory Visit: Payer: Self-pay | Admitting: Nurse Practitioner

## 2020-10-12 DIAGNOSIS — S32018A Other fracture of first lumbar vertebra, initial encounter for closed fracture: Secondary | ICD-10-CM

## 2020-10-12 DIAGNOSIS — R0781 Pleurodynia: Secondary | ICD-10-CM

## 2020-10-18 ENCOUNTER — Other Ambulatory Visit: Payer: Self-pay

## 2020-10-18 ENCOUNTER — Ambulatory Visit
Admission: RE | Admit: 2020-10-18 | Discharge: 2020-10-18 | Disposition: A | Discharge status: Home / Self Care | Payer: Medicare Other | Source: Ambulatory Visit | Attending: Nurse Practitioner | Admitting: Nurse Practitioner

## 2020-10-18 DIAGNOSIS — M545 Low back pain, unspecified: Secondary | ICD-10-CM | POA: Diagnosis not present

## 2020-10-18 DIAGNOSIS — S32018A Other fracture of first lumbar vertebra, initial encounter for closed fracture: Secondary | ICD-10-CM | POA: Diagnosis not present

## 2020-10-18 DIAGNOSIS — S32010A Wedge compression fracture of first lumbar vertebra, initial encounter for closed fracture: Secondary | ICD-10-CM | POA: Diagnosis not present

## 2020-10-18 DIAGNOSIS — M5124 Other intervertebral disc displacement, thoracic region: Secondary | ICD-10-CM | POA: Diagnosis not present

## 2020-10-18 DIAGNOSIS — R0781 Pleurodynia: Secondary | ICD-10-CM

## 2020-10-24 ENCOUNTER — Other Ambulatory Visit: Payer: Self-pay | Admitting: *Deleted

## 2020-11-01 ENCOUNTER — Other Ambulatory Visit: Payer: Self-pay | Admitting: *Deleted

## 2020-11-01 NOTE — Patient Outreach (Signed)
[name]) Care Management  11/01/2020  Carla Kelly 12-05-1932 [id number]   Telephone outreach.  Pt is doing fairly well. Had a simple Thanksgiving at home with her daughter.  Goals Addressed            This Visit's Progress   . COMPLETED: Cope with Pain       Follow Up Date 10/27/20   - learn relaxation techniques - think of new ways to do favorite things    Why is this important?   Living with back pain and enjoying your life may be hard.  Feelings, such as depression or anger, can make your pain worse.  Learning ways to cope may help you find some relief from the pain.    Notes: Pt does rest during the day. Has adopted a way to be able to do her usually daily activities. 11/01/20 [name] does try to do things that are relaxing like reading, deep breathing, prayer. She states the brace that she wears is so cumbersome it also inhibits some of the things she would like to do. She is doing well on this goal, however.    . Increase physical activity       Starting 02/21/17, I will continue to exercise at least 30 min 2-3 days per week.  THN COMMENT 11/01/20: Pt is unable to exercise after her compression fracture. She will be evaluated next week for possible kyphoplasty.    . Increase physical activity       Starting 03/06/2018, I will continue to exercise for 45-60 minutes 4 days per week.  THN Comment 11/01/20 - pt is unable to exercise at this time post compression fx.    . Keep Pain Under Control       Follow Up Date 12/10/20   - develop a personal pain management plan - plan exercise or activity when pain is best controlled - prioritize tasks for the day - track times pain is worst and when it is best - track what makes the pain worse and what makes it better - use ice or heat for pain relief    Why is this important?   Day-to-day life can be hard when you have back pain.  Pain medicine is just one piece of the treatment puzzle. There are many  things you can do to manage pain and keep your back strong.   Lifestyle changes, like stopping smoking and eating foods with Vitamin D and calcium, keep your bones and muscles healthy. Your back is better when it is supported by strong muscles.  You can try these action steps to help you manage your pain.     Notes: 10/10/20 Assessed pain med dose and frequency, advised on max dose and to only use as needed. Encouraged 15 min walking daily in the house. 11/01/20 Pt has reduced APAP to tid and sometimes is able to skip a dose. Does relaxation activities also. Will have orthopedic assessment for possible kyphoplasty this Friday. Pian level 5/10 today.    . Patient Stated       Will use walker with good positioning to avoid fall due to poor  positioning. 09/30/20 Educated on appropriate positioning for desired balance and safety. 11/01/20 Pt reports she continues to use her walker. She is also still wearing her torso brace. She reports it has been a learning experience to be able to move around with the brace on as it is very cumbersome.      We agreed  to talk again in January.  Carla Kelly. Carla Kelly Neither, MSN, [facility] Gerontological Nurse Practitioner [facility] Care Management [phone]

## 2020-11-04 ENCOUNTER — Other Ambulatory Visit: Payer: Self-pay

## 2020-11-04 ENCOUNTER — Other Ambulatory Visit
Admission: RE | Admit: 2020-11-04 | Discharge: 2020-11-04 | Disposition: A | Discharge status: Home / Self Care | Payer: Medicare Other | Source: Ambulatory Visit | Attending: Orthopedic Surgery | Admitting: Orthopedic Surgery

## 2020-11-04 ENCOUNTER — Other Ambulatory Visit: Payer: Self-pay | Admitting: Orthopedic Surgery

## 2020-11-04 DIAGNOSIS — S32010A Wedge compression fracture of first lumbar vertebra, initial encounter for closed fracture: Secondary | ICD-10-CM | POA: Diagnosis not present

## 2020-11-04 DIAGNOSIS — Z20822 Contact with and (suspected) exposure to covid-19: Secondary | ICD-10-CM | POA: Insufficient documentation

## 2020-11-04 DIAGNOSIS — Z01812 Encounter for preprocedural laboratory examination: Secondary | ICD-10-CM | POA: Diagnosis not present

## 2020-11-05 LAB — SARS CORONAVIRUS 2 (TAT 6-24 HRS): SARS Coronavirus 2: NEGATIVE

## 2020-11-07 ENCOUNTER — Encounter
Admission: RE | Admit: 2020-11-07 | Discharge: 2020-11-07 | Disposition: A | Discharge status: Home / Self Care | Payer: Medicare Other | Source: Ambulatory Visit | Attending: Orthopedic Surgery | Admitting: Orthopedic Surgery

## 2020-11-07 ENCOUNTER — Other Ambulatory Visit: Payer: Self-pay

## 2020-11-07 HISTORY — DX: Personal history of other diseases of the digestive system: Z87.19

## 2020-11-07 HISTORY — DX: Anemia, unspecified: D64.9

## 2020-11-07 NOTE — Patient Instructions (Addendum)
Your procedure is scheduled on: 11/08/20 Report to [place]. MUST CHECK IN AT THE ADMITTING DESK To find out your arrival time please call [phone] between 1PM - 3PM on 11/07/20.  Remember: Instructions that are not followed completely may result in serious medical risk, up to and including death, or upon the discretion of your surgeon and anesthesiologist your surgery may need to be rescheduled.     _X__ 1. Do not eat food after midnight the night before your procedure.                 No gum chewing or hard candies. You may drink clear liquids up to 2 hours                 before you are scheduled to arrive for your surgery- DO not drink clear                 liquids within 2 hours of the start of your surgery.                 Clear Liquids include:  water, apple juice without pulp, clear carbohydrate                 drink such as Clearfast or Gatorade, Black Coffee or Tea (Do not add                 anything to coffee or tea). Diabetics water only  __X__2.  On the morning of surgery brush your teeth with toothpaste and water, you                 may rinse your mouth with mouthwash if you wish.  Do not swallow any              toothpaste of mouthwash.     _X__ 3.  No Alcohol for 24 hours before or after surgery.   _X__ 4.  Do Not Smoke or use e-cigarettes For 24 Hours Prior to Your Surgery.                 Do not use any chewable tobacco products for at least 6 hours prior to                 surgery.  ____  5.  Bring all medications with you on the day of surgery if instructed.   __X__  6.  Notify your doctor if there is any change in your medical condition      (cold, fever, infections).     Do not wear jewelry, make-up, hairpins, clips or nail polish. Do not wear lotions, powders, or perfumes.  Do not shave 48 hours prior to surgery. Men may shave face and neck. Do not bring valuables to the hospital.    [facility] is  not responsible for any belongings or valuables.  Contacts, dentures/partials or body piercings may not be worn into surgery. Bring a case for your contacts, glasses or hearing aids, a denture cup will be supplied. Leave your suitcase in the car. After surgery it may be brought to your room. For patients admitted to the hospital, discharge time is determined by your treatment team.   Patients discharged the day of surgery will not be allowed to drive home.   Please read over the following fact sheets that you were given:     __X__ Take these medicines the morning of surgery with  A SIP OF WATER:    1. SYNTHROID 88 MCG tablet  2.   3.   4.  5.  6.  ____ Fleet Enema (as directed)   __X__ Use CHG Soap/SAGE wipes as directed  ____ Use inhalers on the day of surgery  ____ Stop metformin/Janumet/Farxiga 2 days prior to surgery    ____ Take 1/2 of usual insulin dose the night before surgery. No insulin the morning          of surgery.   ____ Stop Blood Thinners Coumadin/Plavix/Xarelto/Pleta/Pradaxa/Eliquis/Effient/Aspirin  on   Or contact your Surgeon, Cardiologist or Medical Doctor regarding  ability to stop your blood thinners  __X__ Stop Anti-inflammatories 7 days before surgery such as Advil, Ibuprofen, Motrin,  BC or Goodies Powder, Naprosyn, Naproxen, Aleve, Aspirin    __X__ Stop all herbal supplements, fish oil or vitamin E until after surgery.    ____ Bring C-Pap to the hospital.       How to Use an Incentive Spirometer    .             1. Sit up on the edge of your bed or on a chair. 2. Hold the incentive spirometer so that it is in an upright position. 3. Before you use the spirometer, breathe out normally. 4. Place the mouthpiece in your mouth. Make sure your lips are closed tightly around it. 5. Breathe in slowly and as deeply as you can through your mouth, causing the piston or the ball to rise toward the top of the chamber. 6. Hold your breath  for 3-5 seconds, or for as long as possible. ? If the spirometer includes a coach indicator, use this to guide you in breathing. Slow down your breathing if the indicator goes above the marked areas. 7. Remove the mouthpiece from your mouth and breathe out normally. The piston or ball will return to the bottom of the chamber. 8. Rest for a few seconds, then repeat the steps 10 or more times. ? Take your time and take a few normal breaths between deep breaths so that you do not get dizzy or light-headed. ? Do this every 1-2 hours when you are awake. 9. If the spirometer includes a goal marker to show the highest number you have reached (best effort), use this as a goal to work toward during each repetition. 10. After each set of 10 deep breaths, cough a few times. This will help to make sure that your lungs are clear. ? If you have an incision on your chest or abdomen from surgery, place a pillow or a rolled-up towel firmly against the incision when you cough. This can help to reduce pain from coughing. General tips  When you become able to get out of bed, walk around often and continue to cough to help clear your lungs.  Keep using the incentive spirometer until your health care provider says it is okay to stop using it. If you have been in the hospital, you may be told to keep using the spirometer at home. Contact a health care provider if:  You are having difficulty using the spirometer.  You have trouble using the spirometer as often as instructed.  Your pain medicine is not giving enough relief for you to use the spirometer as told.  You have a fever.  You develop shortness of breath. Get help right away if:  You develop a cough with bloody mucus from the lungs (bloody sputum).  You have fluid or  blood coming from an incision site after you cough. Summary  An incentive spirometer is a tool that can help you learn to take long, deep breaths to keep your lungs clear and  active.  You may be asked to use a spirometer after a surgery, if you have a lung problem or a history of smoking, or if you have been inactive for a long period of time.  Use your incentive spirometer as instructed every 1-2 hours while you are awake.  If you have an incision on your chest or abdomen, place a pillow or a rolled-up towel firmly against your incision when you cough. This will help to reduce pain. This information is not intended to replace advice given to you by your health care provider. Make sure you discuss any questions you have with your health care provider. Document Revised: 06/12/2019 Document Reviewed: 09/25/2017 Elsevier Patient Education  2020 [employer].

## 2020-11-08 ENCOUNTER — Ambulatory Visit: Payer: Medicare Other | Admitting: Anesthesiology

## 2020-11-08 ENCOUNTER — Encounter
Admission: RE | Disposition: A | Discharge status: Home / Self Care | Payer: Self-pay | Source: Home / Self Care | Attending: Orthopedic Surgery

## 2020-11-08 ENCOUNTER — Other Ambulatory Visit: Payer: Self-pay

## 2020-11-08 ENCOUNTER — Ambulatory Visit: Payer: Medicare Other

## 2020-11-08 ENCOUNTER — Encounter: Payer: Self-pay | Admitting: Orthopedic Surgery

## 2020-11-08 ENCOUNTER — Ambulatory Visit
Admission: RE | Admit: 2020-11-08 | Discharge: 2020-11-08 | Disposition: A | Discharge status: Home / Self Care | Payer: Medicare Other | Attending: Orthopedic Surgery | Admitting: Orthopedic Surgery

## 2020-11-08 DIAGNOSIS — Z79899 Other long term (current) drug therapy: Secondary | ICD-10-CM | POA: Diagnosis not present

## 2020-11-08 DIAGNOSIS — Z981 Arthrodesis status: Secondary | ICD-10-CM | POA: Diagnosis not present

## 2020-11-08 DIAGNOSIS — Z419 Encounter for procedure for purposes other than remedying health state, unspecified: Secondary | ICD-10-CM

## 2020-11-08 DIAGNOSIS — X58XXXA Exposure to other specified factors, initial encounter: Secondary | ICD-10-CM | POA: Diagnosis not present

## 2020-11-08 DIAGNOSIS — E039 Hypothyroidism, unspecified: Secondary | ICD-10-CM | POA: Diagnosis not present

## 2020-11-08 DIAGNOSIS — Z96651 Presence of right artificial knee joint: Secondary | ICD-10-CM | POA: Diagnosis not present

## 2020-11-08 DIAGNOSIS — S32010A Wedge compression fracture of first lumbar vertebra, initial encounter for closed fracture: Secondary | ICD-10-CM | POA: Insufficient documentation

## 2020-11-08 DIAGNOSIS — Z8673 Personal history of transient ischemic attack (TIA), and cerebral infarction without residual deficits: Secondary | ICD-10-CM | POA: Insufficient documentation

## 2020-11-08 DIAGNOSIS — Z79890 Hormone replacement therapy (postmenopausal): Secondary | ICD-10-CM | POA: Diagnosis not present

## 2020-11-08 DIAGNOSIS — Z7982 Long term (current) use of aspirin: Secondary | ICD-10-CM | POA: Insufficient documentation

## 2020-11-08 DIAGNOSIS — M4856XA Collapsed vertebra, not elsewhere classified, lumbar region, initial encounter for fracture: Secondary | ICD-10-CM | POA: Diagnosis not present

## 2020-11-08 DIAGNOSIS — E785 Hyperlipidemia, unspecified: Secondary | ICD-10-CM | POA: Diagnosis not present

## 2020-11-08 DIAGNOSIS — J45909 Unspecified asthma, uncomplicated: Secondary | ICD-10-CM | POA: Diagnosis not present

## 2020-11-08 HISTORY — PX: KYPHOPLASTY: SHX5884

## 2020-11-08 LAB — POCT I-STAT, CHEM 8
BUN: 24 mg/dL — ABNORMAL HIGH (ref 8–23)
Calcium, Ion: 1.25 mmol/L (ref 1.15–1.40)
Chloride: 105 mmol/L (ref 98–111)
Creatinine, Ser: 0.6 mg/dL (ref 0.44–1.00)
Glucose, Bld: 81 mg/dL (ref 70–99)
HCT: 39 % (ref 36.0–46.0)
Hemoglobin: 13.3 g/dL (ref 12.0–15.0)
Potassium: 3.9 mmol/L (ref 3.5–5.1)
Sodium: 141 mmol/L (ref 135–145)
TCO2: 25 mmol/L (ref 22–32)

## 2020-11-08 SURGERY — KYPHOPLASTY
Anesthesia: General

## 2020-11-08 MED ORDER — BUPIVACAINE-EPINEPHRINE (PF) 0.5% -1:200000 IJ SOLN
INTRAMUSCULAR | Status: DC | PRN
  Administered 2020-11-08: 20 mL

## 2020-11-08 MED ORDER — LIDOCAINE HCL (PF) 1 % IJ SOLN
INTRAMUSCULAR | Status: AC
  Filled 2020-11-08: qty 60

## 2020-11-08 MED ORDER — IOHEXOL 180 MG/ML  SOLN
INTRAMUSCULAR | Status: DC | PRN
  Administered 2020-11-08: 20 mL

## 2020-11-08 MED ORDER — FENTANYL CITRATE (PF) 100 MCG/2ML IJ SOLN: 25.0000 ug | INTRAMUSCULAR | Status: DC | PRN

## 2020-11-08 MED ORDER — LIDOCAINE HCL 1 % IJ SOLN
INTRAMUSCULAR | Status: DC | PRN
  Administered 2020-11-08: 30 mL

## 2020-11-08 MED ORDER — FAMOTIDINE 20 MG PO TABS
ORAL_TABLET | ORAL | Status: AC
  Administered 2020-11-08: 20 mg via ORAL
  Filled 2020-11-08: qty 1

## 2020-11-08 MED ORDER — ONDANSETRON HCL 4 MG/2ML IJ SOLN: 4.0000 mg | Freq: Four times a day (QID) | INTRAMUSCULAR | Status: DC | PRN

## 2020-11-08 MED ORDER — HYDROCODONE-ACETAMINOPHEN 5-325 MG PO TABS: 1.0000 | ORAL_TABLET | Freq: Four times a day (QID) | ORAL | 0 refills | Status: DC | PRN

## 2020-11-08 MED ORDER — METOCLOPRAMIDE HCL 10 MG PO TABS
5.0000 mg | ORAL_TABLET | Freq: Three times a day (TID) | ORAL | Status: DC | PRN
Start: 2020-11-08 — End: 2020-11-08

## 2020-11-08 MED ORDER — LIDOCAINE HCL (CARDIAC) PF 100 MG/5ML IV SOSY
PREFILLED_SYRINGE | INTRAVENOUS | Status: DC | PRN
  Administered 2020-11-08: 80 mg via INTRAVENOUS

## 2020-11-08 MED ORDER — LACTATED RINGERS IV SOLN: INTRAVENOUS | Status: DC

## 2020-11-08 MED ORDER — BUPIVACAINE-EPINEPHRINE (PF) 0.5% -1:200000 IJ SOLN
INTRAMUSCULAR | Status: AC
  Filled 2020-11-08: qty 60

## 2020-11-08 MED ORDER — ONDANSETRON HCL 4 MG PO TABS: 4.0000 mg | ORAL_TABLET | Freq: Four times a day (QID) | ORAL | Status: DC | PRN

## 2020-11-08 MED ORDER — METOCLOPRAMIDE HCL 5 MG/ML IJ SOLN: 5.0000 mg | Freq: Three times a day (TID) | INTRAMUSCULAR | Status: DC | PRN

## 2020-11-08 MED ORDER — PROPOFOL 500 MG/50ML IV EMUL
INTRAVENOUS | Status: AC
  Filled 2020-11-08: qty 50

## 2020-11-08 MED ORDER — CEFAZOLIN SODIUM-DEXTROSE 2-4 GM/100ML-% IV SOLN
2.0000 g | INTRAVENOUS | Status: AC
  Administered 2020-11-08: 2 g via INTRAVENOUS

## 2020-11-08 MED ORDER — CEFAZOLIN SODIUM-DEXTROSE 2-4 GM/100ML-% IV SOLN
INTRAVENOUS | Status: AC
  Filled 2020-11-08: qty 100

## 2020-11-08 MED ORDER — PROPOFOL 10 MG/ML IV BOLUS
INTRAVENOUS | Status: DC | PRN
  Administered 2020-11-08: 30 mg via INTRAVENOUS
  Administered 2020-11-08 (×3): 20 mg via INTRAVENOUS

## 2020-11-08 MED ORDER — SODIUM CHLORIDE 0.9 % IV SOLN: INTRAVENOUS | Status: DC

## 2020-11-08 MED ORDER — FAMOTIDINE 20 MG PO TABS: 20.0000 mg | ORAL_TABLET | Freq: Once | ORAL | Status: AC

## 2020-11-08 MED ORDER — ONDANSETRON HCL 4 MG/2ML IJ SOLN: 4.0000 mg | Freq: Once | INTRAMUSCULAR | Status: DC | PRN

## 2020-11-08 MED ORDER — LIDOCAINE HCL (PF) 2 % IJ SOLN
INTRAMUSCULAR | Status: AC
  Filled 2020-11-08: qty 5

## 2020-11-08 MED ORDER — PROPOFOL 500 MG/50ML IV EMUL
INTRAVENOUS | Status: DC | PRN
  Administered 2020-11-08: 50 ug/kg/min via INTRAVENOUS

## 2020-11-08 SURGICAL SUPPLY — 21 items
ADH SKN CLS APL DERMABOND .7 (GAUZE/BANDAGES/DRESSINGS) ×1
CEMENT KYPHON CX01A KIT/MIXER (Cement) ×2 IMPLANT
COVER WAND RF STERILE (DRAPES) ×2 IMPLANT
DERMABOND ADVANCED (GAUZE/BANDAGES/DRESSINGS) ×1
DERMABOND ADVANCED .7 DNX12 (GAUZE/BANDAGES/DRESSINGS) ×1 IMPLANT
DEVICE BIOPSY BONE KYPHX (INSTRUMENTS) ×2 IMPLANT
DRAPE C-ARM XRAY 36X54 (DRAPES) ×2 IMPLANT
DURAPREP 26ML APPLICATOR (WOUND CARE) ×2 IMPLANT
GLOVE SURG SYN 9.0  PF PI (GLOVE) ×2
GLOVE SURG SYN 9.0 PF PI (GLOVE) ×1 IMPLANT
GOWN SRG 2XL LVL 4 RGLN SLV (GOWNS) ×1 IMPLANT
GOWN STRL NON-REIN 2XL LVL4 (GOWNS) ×2
GOWN STRL REUS W/ TWL LRG LVL3 (GOWN DISPOSABLE) ×1 IMPLANT
GOWN STRL REUS W/TWL LRG LVL3 (GOWN DISPOSABLE) ×2
MANIFOLD NEPTUNE II (INSTRUMENTS) ×2 IMPLANT
PACK KYPHOPLASTY (MISCELLANEOUS) ×2 IMPLANT
RENTAL RFA GENERATOR (MISCELLANEOUS) IMPLANT
STRAP SAFETY 5IN WIDE (MISCELLANEOUS) ×2 IMPLANT
SWABSTK COMLB BENZOIN TINCTURE (MISCELLANEOUS) ×2 IMPLANT
TRAY KYPHOPAK 15/3 EXPRESS 1ST (MISCELLANEOUS) ×2 IMPLANT
TRAY KYPHOPAK 20/3 EXPRESS 1ST (MISCELLANEOUS) IMPLANT

## 2020-11-08 NOTE — Anesthesia Preprocedure Evaluation (Signed)
Anesthesia Evaluation  Patient identified by MRN, date of birth, ID band Patient awake    Reviewed: Allergy & Precautions, NPO status , Patient's Chart, lab work & pertinent test results  History of Anesthesia Complications (+) DIFFICULT AIRWAY and history of anesthetic complications  Airway Mallampati: II       Dental  (+) Upper Dentures, Lower Dentures   Pulmonary asthma , neg sleep apnea, Not current smoker,           Cardiovascular (-) hypertension(-) Past MI and (-) CHF (-) dysrhythmias (-) Valvular Problems/Murmurs     Neuro/Psych neg Seizures Depression TIANo Residual Symptoms    GI/Hepatic Neg liver ROS, hiatal hernia, GERD  ,  Endo/Other  neg diabetesHypothyroidism   Renal/GU negative Renal ROS     Musculoskeletal   Abdominal   Peds  Hematology  (+) anemia ,   Anesthesia Other Findings   Reproductive/Obstetrics                             Anesthesia Physical Anesthesia Plan  ASA: III  Anesthesia Plan: General   Post-op Pain Management:    Induction: Intravenous  PONV Risk Score and Plan: 3 and TIVA, Propofol infusion and Treatment may vary due to age or medical condition  Airway Management Planned:   Additional Equipment:   Intra-op Plan:   Post-operative Plan:   Informed Consent: I have reviewed the patients History and Physical, chart, labs and discussed the procedure including the risks, benefits and alternatives for the proposed anesthesia with the patient or authorized representative who has indicated his/her understanding and acceptance.       Plan Discussed with:   Anesthesia Plan Comments:         Anesthesia Quick Evaluation

## 2020-11-08 NOTE — Anesthesia Postprocedure Evaluation (Signed)
Anesthesia Post Note  Patient: Carla Kelly  Procedure(s) Performed: L1 Kyphoplasty (N/A )  Patient location during evaluation: PACU Anesthesia Type: General Level of consciousness: awake and alert Pain management: pain level controlled Vital Signs Assessment: post-procedure vital signs reviewed and stable Respiratory status: spontaneous breathing and respiratory function stable Cardiovascular status: stable Anesthetic complications: no   No complications documented.   Last Vitals:  Vitals:   11/08/20 1548 11/08/20 1603  BP: (!) 144/62 140/74  Pulse: 73 73  Resp: 15 18  Temp:    SpO2: 98% 100%    Last Pain:  Vitals:   11/08/20 1545  TempSrc:   PainSc: 0-No pain                 Kiona Blume K

## 2020-11-08 NOTE — OR Nursing (Signed)
Per Dr Rudene Christians, pt can resume her aspirin 325 mg today. Continue to monitor.

## 2020-11-08 NOTE — H&P (Signed)
Chief Complaint  Patient presents with  . Establish Care  L1 Compression fx per MRI    History of the Present Illness: Carla Kelly is a 86 y.o. female here today.   The patient presents for evaluation of an L1 fracture. An MRI done at [facility] on 10/18/2020 shows extensive edema in L1, also significant degenerative changes in the lower lumbar, which seems to be just some edema of the bone, but it seems to be related to her degenerative disc at multiple levels.  The patient states she is not having much pain right now. She tells me she injured her back in the middle of 08/2020. She states she is not walking that much, but she is walking than 15 minutes daily+. The patient presents with a female companion who states the patient normally uses a walker, but she has walked with a cane. She tells me she has an area that felt like it was being pinched, but it has gotten better, and she is able to sit up straighter. The patient feels she is losing her muscle strength. She had severe pain when she was positioned for her MRI without her brace.   She states she has been off estrogen for 8 years. She has had a prior epidural for her spinal stenosis, which was effective in relieving her back pain.  The patient lived alone prior to her injury. She also rode a bike.   I have reviewed past medical, surgical, social and family history, and allergies as documented in the EMR.  Past Medical History: Past Medical History:  Diagnosis Date  . Adenomatous colon polyp, unspecified  . Asthma  . B-complex deficiency  . Depression  . Disorder of bone and cartilage  . Embolism and thrombosis (CMS-HCC)  . Fibromyalgia  . History of herpes zoster  . HLD (hyperlipidemia)  . Hypoglycemia  . Hypothyroidism  . IBS (irritable bowel syndrome)  . Mitral valve disorder  . Orbital floor fracture (CMS-HCC) 08/2015  after a fall  . Osteoarthritis  . Psoriasis  . Stroke (CMS-HCC)  . Swelling of extremity, right  . 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 Urinary incontinence  . Vitamin D deficiency   Past Surgical History: Past Surgical History:  Procedure Laterality Date  . ABDOMINAL HYSTERECTOMY 1970  . APPENDECTOMY  . bladder tack 03/2004  . Breast biopsy (Right) 1995  . Breast cyst aspiration (Left) 03/17/2015  . Cataract extraction (Right) 10/2012  . Cataract extraction w/phaco (Left) 05/10/2015  . Cervical disc surgery  . CHOLECYSTECTOMY 2004  . COLONOSCOPY 12/2000  . eye surgery, unspecified  . mouth surgery  . TONSILLECTOMY  . Total knee arthroplasty (Right) 08/13/2014  . TYMPANOPLASTY  . US transvaginal pelvic modified 08/2001   Past Family History: History reviewed. No pertinent family history.  Medications: Current Outpatient Medications Ordered in Epic  Medication Sig Dispense Refill  . acetaminophen (TYLENOL) 500 MG tablet Take 1,000 mg by mouth 3 (three) times daily  . albuterol (PROVENTIL) 4 MG tablet Take 1 tablet by mouth every 4 (four) hours as needed  . ascorbic acid, vitamin C, (VITAMIN C) 1000 MG tablet Take 1 tablet by mouth once daily  . aspirin 325 MG tablet Take 1 tablet by mouth once daily  . Bifidobacterium infantis (ALIGN) 4 mg capsule Take 1 capsule by mouth every other day  . diazePAM (VALIUM) 5 MG tablet Take 1 tablet by mouth nightly as needed for Anxiety  . docosahexaenoic acid-epa 120-180 mg Cap Take 1  tablet by mouth once daily  . fluocinolone and shower cap (DERMA-SMOOTHE) 0.01 % scalp oil Apply topically 3 (three) times daily as needed  . FUROsemide (LASIX) 40 MG tablet Take 0.5 tablets by mouth 2 (two) times daily  . hydrocortisone 1 % cream Apply 1 Application topically 2 (two) times daily  . SYNTHROID 88 mcg tablet Take 1 tablet by mouth every morning   No current Epic-ordered facility-administered medications on file.   Allergies: Allergies  Allergen Reactions  . Opioids - Morphine Analogues Vomiting  . Polyoxyethylated Stearyl Alcohol  (Steareth-30) Other (See Comments)  Alcohol -- Blisters in mouth & lungs    Body mass index is 25.27 kg/m.  Review of Systems: A comprehensive 14 point ROS was performed, reviewed, and the pertinent orthopaedic findings are documented in the HPI.  Vitals:  11/04/20 1045  BP: 138/72    General Physical Examination:   General/Constitutional: No apparent distress: well-nourished and well developed. Eyes: Pupils equal, round with synchronous movement. Lungs: Clear to auscultation HEENT: Full dentures Vascular: No edema, swelling or tenderness, except as noted in detailed exam. Cardiac: Heart rate and rhythm is regular. Integumentary: No impressive skin lesions present, except as noted in detailed exam. Neuro/Psych: Normal mood and affect, oriented to person, place and time.  Musculoskeletal Examination:  On exam, tenderness to the L1 fracture site. Unable to ambulate without the walker.  Radiographs:  No new imaging studies were obtained or reviewed today.  Assessment: ICD-10-CM  1. Closed wedge compression fracture of L1 vertebra, initial encounter (CMS-HCC) S32.010A   Plan:  The patient has clinical findings of painful L1 kyphoplasty despite bracing and pain medication.  We discussed the patient's prior x-ray findings. I explained most compression fractures heal on their own. I explained she is a good candidate for L1 kyphoplasty, as she has been active and is losing her function by being inactive. I explained the surgery and postoperative course in detail.  We will plan surgery next week.  Surgical Risks:  The nature of the condition and the proposed procedure has been reviewed in detail with the patient. Surgical versus non-surgical options and prognosis for recovery have been reviewed and the inherent risks and benefits of each have been discussed including the risks of infection, bleeding, injury to nerves/blood vessels/tendons, incomplete relief of symptoms,  persisting pain and/or stiffness, loss of function, complex regional pain syndrome, failure of the procedure, as appropriate.  Teeth: Full dentures  Attestation: I, Dawn Royse, am documenting for [facility], MD utilizing [place].    Reviewed  H+P. No changes noted.

## 2020-11-08 NOTE — Op Note (Signed)
11/08/2020  3:18 PM  PATIENT:  Carla Kelly   MRN: [id number]   PRE-OPERATIVE DIAGNOSIS:  closed wedge compression fracture of L1   POST-OPERATIVE DIAGNOSIS:  closed wedge compression fracture of L1   PROCEDURE:  Procedure(s): KYPHOPLASTY L1  SURGEON: Laurene Footman, MD   ASSISTANTS: None   ANESTHESIA:   local and MAC   EBL:  No intake/output data recorded.   BLOOD ADMINISTERED:none   DRAINS: none    LOCAL MEDICATIONS USED:  MARCAINE    and XYLOCAINE    SPECIMEN:    L1 vertebral body biopsy   DISPOSITION OF SPECIMEN:  Pathology   COUNTS:  YES   TOURNIQUET:  * No tourniquets in log *   IMPLANTS: Bone cement   DICTATION: .Dragon Dictation  patient was brought to the operating room and after adequate anesthesia was obtained the patient was placed prone.  C arm was brought in in good visualization of the affected level obtained on both AP and lateral projections.  After patient identification and timeout procedures were completed, local anesthetic was infiltrated with 10 cc 1% Xylocaine infiltrated subcutaneously.  This is done the area on the each side of the planned approach.  The back was then prepped and draped in the usual sterile manner and repeat timeout procedure carried out.  A spinal needle was brought down to the pedicle on the each side of  L1 and a 50-50 mix of 1% Xylocaine half percent Sensorcaine with epinephrine total of 20 cc injected on each side.  After allowing this to set a small incision was made and the trocar was advanced into the vertebral body in an extrapedicular fashion.  Biopsy was obtained Drilling was carried out balloon inserted with inflation to  2-1/2 cc on the right and 2-1/2 cc on the left.  When the cement was appropriate consistency 4-1/2 cc were injected on the right and 3-1/2 cc on the left into the vertebral body without extravasation, good fill superior to inferior endplates and from right to left sides along the inferior endplate.  After  the cement had set the trochar was removed and permanent C-arm views obtained.  The wound was closed with Dermabond followed by Band-Aid   PLAN OF CARE:  Discharge home after recovery room   PATIENT DISPOSITION:  PACU - hemodynamically stable.

## 2020-11-08 NOTE — Transfer of Care (Signed)
Immediate Anesthesia Transfer of Care Note  Patient: Carla Kelly  Procedure(s) Performed: L1 Kyphoplasty (N/A )  Patient Location: PACU  Anesthesia Type:General  Level of Consciousness: awake, alert , oriented and patient cooperative  Airway & Oxygen Therapy: Patient Spontanous Breathing  Post-op Assessment: Report given to RN and Post -op Vital signs reviewed and stable  Post vital signs: Reviewed and stable  Last Vitals:  Vitals Value Taken Time  BP 153/72 11/08/20 1517  Temp    Pulse 102 11/08/20 1520  Resp 18 11/08/20 1520  SpO2 99 % 11/08/20 1520  Vitals shown include unvalidated device data.  Last Pain:  Vitals:   11/08/20 1337  TempSrc: Oral  PainSc: 0-No pain         Complications: No complications documented.

## 2020-11-08 NOTE — Discharge Instructions (Addendum)
AMBULATORY SURGERY  DISCHARGE INSTRUCTIONS   1) The drugs that you were given will stay in your system until tomorrow so for the next 24 hours you should not:  A) Drive an automobile B) Make any legal decisions C) Drink any alcoholic beverage   2) You may resume regular meals tomorrow.  Today it is better to start with liquids and gradually work up to solid foods.  You may eat anything you prefer, but it is better to start with liquids, then soup and crackers, and gradually work up to solid foods.   3) Please notify your doctor immediately if you have any unusual bleeding, trouble breathing, redness and pain at the surgery site, drainage, fever, or pain not relieved by medication.    4) Additional Instructions:        Please contact your physician with any problems or Same Day Surgery at [phone], Monday through Friday 6 am to 4 pm, or [place] at [facility] number at [phone].Take it easy today and tomorrow then walk is much as you can. Avoid bending and lifting for 2 weeks but be otherwise as active as you can Pain medicine as directed Band-Aids come off on Thursday.  Then okay to shower Call office if you are having problems Only wear brace if you want to.

## 2020-11-09 ENCOUNTER — Encounter: Payer: Self-pay | Admitting: Orthopedic Surgery

## 2020-11-10 LAB — SURGICAL PATHOLOGY

## 2020-11-23 ENCOUNTER — Encounter: Payer: Self-pay | Admitting: Family Medicine

## 2020-11-23 DIAGNOSIS — Z9889 Other specified postprocedural states: Secondary | ICD-10-CM | POA: Diagnosis not present

## 2020-11-23 DIAGNOSIS — E2839 Other primary ovarian failure: Secondary | ICD-10-CM

## 2020-11-23 DIAGNOSIS — M810 Age-related osteoporosis without current pathological fracture: Secondary | ICD-10-CM

## 2020-11-23 DIAGNOSIS — S32010S Wedge compression fracture of first lumbar vertebra, sequela: Secondary | ICD-10-CM

## 2020-11-29 DIAGNOSIS — E539 Vitamin B deficiency, unspecified: Secondary | ICD-10-CM | POA: Diagnosis not present

## 2020-11-29 DIAGNOSIS — R32 Unspecified urinary incontinence: Secondary | ICD-10-CM | POA: Diagnosis not present

## 2020-11-29 DIAGNOSIS — K589 Irritable bowel syndrome without diarrhea: Secondary | ICD-10-CM | POA: Diagnosis not present

## 2020-11-29 DIAGNOSIS — E559 Vitamin D deficiency, unspecified: Secondary | ICD-10-CM | POA: Diagnosis not present

## 2020-11-29 DIAGNOSIS — S32010D Wedge compression fracture of first lumbar vertebra, subsequent encounter for fracture with routine healing: Secondary | ICD-10-CM | POA: Diagnosis not present

## 2020-11-29 DIAGNOSIS — E039 Hypothyroidism, unspecified: Secondary | ICD-10-CM | POA: Diagnosis not present

## 2020-11-29 DIAGNOSIS — J45909 Unspecified asthma, uncomplicated: Secondary | ICD-10-CM | POA: Diagnosis not present

## 2020-11-29 DIAGNOSIS — L409 Psoriasis, unspecified: Secondary | ICD-10-CM | POA: Diagnosis not present

## 2020-11-29 DIAGNOSIS — Z7982 Long term (current) use of aspirin: Secondary | ICD-10-CM | POA: Diagnosis not present

## 2020-11-29 DIAGNOSIS — F32A Depression, unspecified: Secondary | ICD-10-CM | POA: Diagnosis not present

## 2020-11-29 DIAGNOSIS — I059 Rheumatic mitral valve disease, unspecified: Secondary | ICD-10-CM | POA: Diagnosis not present

## 2020-11-29 DIAGNOSIS — M797 Fibromyalgia: Secondary | ICD-10-CM | POA: Diagnosis not present

## 2020-11-29 DIAGNOSIS — E785 Hyperlipidemia, unspecified: Secondary | ICD-10-CM | POA: Diagnosis not present

## 2020-11-29 DIAGNOSIS — Z9889 Other specified postprocedural states: Secondary | ICD-10-CM | POA: Diagnosis not present

## 2020-11-29 DIAGNOSIS — M899 Disorder of bone, unspecified: Secondary | ICD-10-CM | POA: Diagnosis not present

## 2020-11-29 DIAGNOSIS — M1991 Primary osteoarthritis, unspecified site: Secondary | ICD-10-CM | POA: Diagnosis not present

## 2020-12-02 ENCOUNTER — Other Ambulatory Visit: Payer: Self-pay | Admitting: *Deleted

## 2020-12-02 NOTE — Patient Outreach (Signed)
[facility]) Care Management  12/02/2020  ELLANOR FEUERSTEIN 10/06/1933 [id number]   Telephone outreach to follow up on kyphoplasty.  Goals have been met. Goals Addressed            This Visit's Progress   . COMPLETED: Keep Pain Under Control       Follow Up Date 12/10/20   - develop a personal pain management plan - plan exercise or activity when pain is best controlled - prioritize tasks for the day - track times pain is worst and when it is best - track what makes the pain worse and what makes it better - use ice or heat for pain relief    Why is this important?   Day-to-day life can be hard when you have back pain.  Pain medicine is just one piece of the treatment puzzle. There are many things you can do to manage pain and keep your back strong.   Lifestyle changes, like stopping smoking and eating foods with Vitamin D and calcium, keep your bones and muscles healthy. Your back is better when it is supported by strong muscles.  You can try these action steps to help you manage your pain.     Notes: 10/10/20 Assessed pain med dose and frequency, advised on max dose and to only use as needed. Encouraged 15 min walking daily in the house. 11/01/20 Pt has reduced APAP to tid and sometimes is able to skip a dose. Does relaxation activities also. Will have orthopedic assessment for possible kyphoplasty this Friday. Pian level 5/10 today. 12/02/20 Pt had her kyphoplasty with good results and is taking PT. SHe is out of her brace. Her pain level currently is a "0" the most it gets now is up to a "2/10"    . COMPLETED: Patient Stated       Will use walker with good positioning to avoid fall due to poor  positioning. 09/30/20 Educated on appropriate positioning for desired balance and safety. 11/01/20 Pt reports she continues to use her walker. She is also still wearing her torso brace. She reports it has been a learning experience to be able to move around with the brace on as it is  very cumbersome. 12/02/20 Pt no longer needs her walker. She is using a cane only now. She had the kyphoplasty which went very well and no longer needs to use her brace. She is a happy lady now.      Reinforced safety precautions.  Encouraged pt to call in the future with any questions, new needs or problems.  Will close her case at this time.  [name]. [name], MSN, [facility] Gerontological Nurse Practitioner [facility] Care Management [phone]

## 2020-12-12 ENCOUNTER — Other Ambulatory Visit: Payer: Self-pay | Admitting: Family Medicine

## 2020-12-14 ENCOUNTER — Telehealth: Payer: Self-pay

## 2020-12-14 NOTE — Telephone Encounter (Signed)
Will see what Raquel Sarna says because I am not aware of a way to opt out of it, I think it just happens every time a refill is done

## 2020-12-14 NOTE — Telephone Encounter (Addendum)
Pt left v/m and pt spoke with Cecilie Lowers [phone] and he said that [facility] was sending these messages and not [place]. If pt wants my chart message then will need to let [facility] be aware. Pt prefers my chart message or email about this and Cecilie Lowers advised pt that out office should be able to send either by my chart or email and not a text. Pt request cb. Sending note to [place] and Donzetta Matters front office mgr. See staff message 10/25/20 about [employer] launch.

## 2020-12-14 NOTE — Telephone Encounter (Addendum)
I have not sent pt anything so it probably was about refill since that was only thing that happened recently

## 2020-12-14 NOTE — Telephone Encounter (Signed)
Pt said she is staying at her daughters while she is recovering from surgery and the roads are not clear and pt got text message about 2 - 3 days ago and cannot open text message and wants to know if can be a my chart message sent to pt about whatever the text is about; I gave pt the contact # [phone] because it may be the levothyroxine refill notification for pt . Pt is aware med is at [facility] and pt has med at her daughters and will hope to get the new refill after the next snow storm. Sending note to [place] for review.

## 2020-12-22 ENCOUNTER — Other Ambulatory Visit: Payer: Self-pay | Admitting: Family Medicine

## 2020-12-22 DIAGNOSIS — Z1231 Encounter for screening mammogram for malignant neoplasm of breast: Secondary | ICD-10-CM

## 2020-12-27 NOTE — Telephone Encounter (Signed)
After research, I was not able to find any way to opt out. I agree with [place]. I attempted to contact patient to discuss. Lvm asking her to call office. I advise she call [phone] to see if there is anything they can do on their side to fix this.

## 2020-12-29 DIAGNOSIS — M899 Disorder of bone, unspecified: Secondary | ICD-10-CM | POA: Diagnosis not present

## 2020-12-29 DIAGNOSIS — I059 Rheumatic mitral valve disease, unspecified: Secondary | ICD-10-CM | POA: Diagnosis not present

## 2020-12-29 DIAGNOSIS — M797 Fibromyalgia: Secondary | ICD-10-CM | POA: Diagnosis not present

## 2020-12-29 DIAGNOSIS — Z7982 Long term (current) use of aspirin: Secondary | ICD-10-CM | POA: Diagnosis not present

## 2020-12-29 DIAGNOSIS — E539 Vitamin B deficiency, unspecified: Secondary | ICD-10-CM | POA: Diagnosis not present

## 2020-12-29 DIAGNOSIS — K589 Irritable bowel syndrome without diarrhea: Secondary | ICD-10-CM | POA: Diagnosis not present

## 2020-12-29 DIAGNOSIS — L409 Psoriasis, unspecified: Secondary | ICD-10-CM | POA: Diagnosis not present

## 2020-12-29 DIAGNOSIS — M1991 Primary osteoarthritis, unspecified site: Secondary | ICD-10-CM | POA: Diagnosis not present

## 2020-12-29 DIAGNOSIS — J45909 Unspecified asthma, uncomplicated: Secondary | ICD-10-CM | POA: Diagnosis not present

## 2020-12-29 DIAGNOSIS — E785 Hyperlipidemia, unspecified: Secondary | ICD-10-CM | POA: Diagnosis not present

## 2020-12-29 DIAGNOSIS — E559 Vitamin D deficiency, unspecified: Secondary | ICD-10-CM | POA: Diagnosis not present

## 2020-12-29 DIAGNOSIS — F32A Depression, unspecified: Secondary | ICD-10-CM | POA: Diagnosis not present

## 2020-12-29 DIAGNOSIS — S32010D Wedge compression fracture of first lumbar vertebra, subsequent encounter for fracture with routine healing: Secondary | ICD-10-CM | POA: Diagnosis not present

## 2020-12-29 DIAGNOSIS — Z9889 Other specified postprocedural states: Secondary | ICD-10-CM | POA: Diagnosis not present

## 2020-12-29 DIAGNOSIS — E039 Hypothyroidism, unspecified: Secondary | ICD-10-CM | POA: Diagnosis not present

## 2020-12-29 DIAGNOSIS — R32 Unspecified urinary incontinence: Secondary | ICD-10-CM | POA: Diagnosis not present

## 2021-01-07 IMAGING — XA DG LUMBAR SPINE 2-3V
1 series · 1 of 1 positions shown · non-contrast
Comparison: Lumbar MRI 10/18/2020

CLINICAL DATA: Kyphoplasty.

EXAM:
DG C-ARM 1-60 MIN; LUMBAR SPINE - 2-3 VIEW
FLUOROSCOPY TIME:  Fluoroscopy Time:  2 minutes 13 seconds
Radiation Exposure Index (if provided by the fluoroscopic device):
Not available
Number of Acquired Spot Images: 3

[Series 4: ortho standard · 1 of 1 slices shown]
[im 1/1]
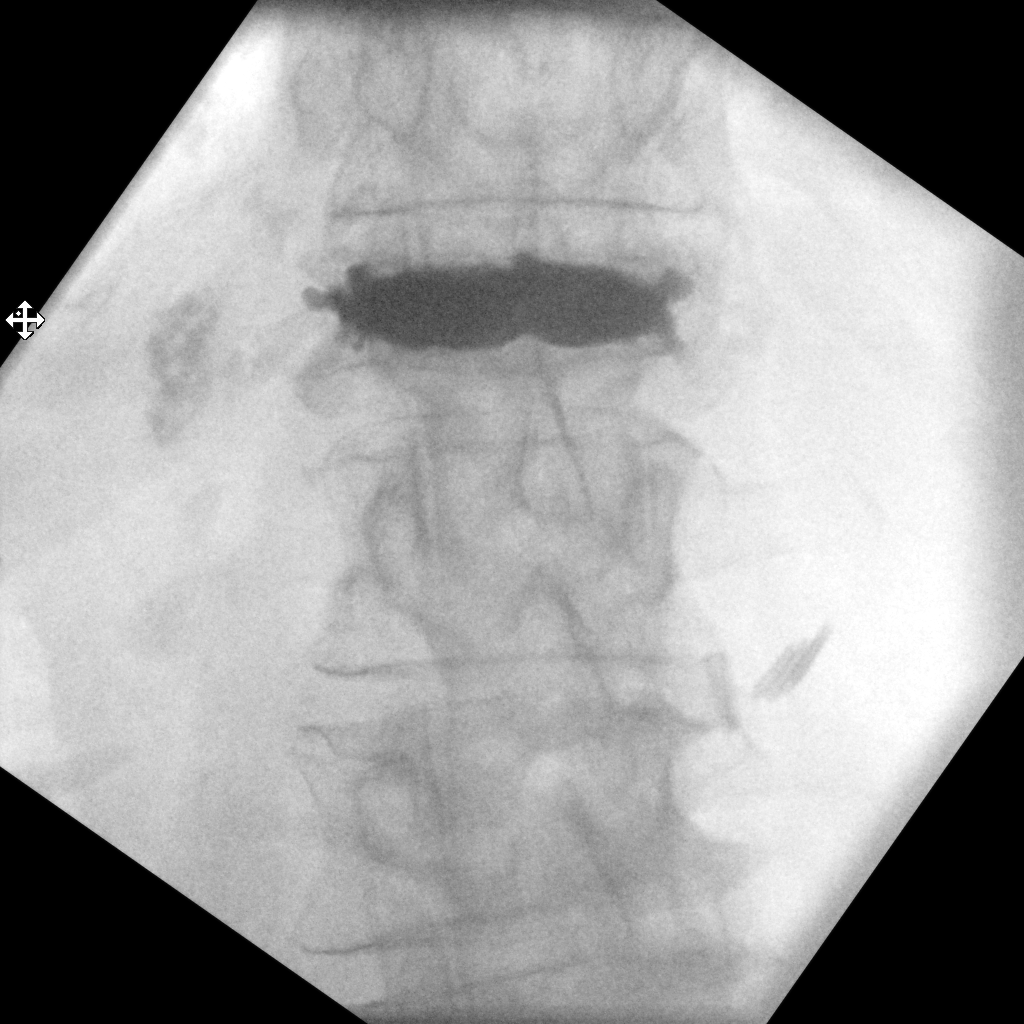

[1 of 1 positions shown; findings below may reference images not displayed]

FINDINGS: Two lateral and single frontal views of the upper lumbar spine.
Compression fracture with vertebral plasty material. Level is
difficult to delineate on this common view.
IMPRESSION: Fluoroscopic guidance for kyphoplasty of compression fracture, level
difficult to delineate on the coned views.

## 2021-01-09 DIAGNOSIS — R1903 Right lower quadrant abdominal swelling, mass and lump: Secondary | ICD-10-CM | POA: Diagnosis not present

## 2021-01-28 DIAGNOSIS — Z7982 Long term (current) use of aspirin: Secondary | ICD-10-CM | POA: Diagnosis not present

## 2021-01-28 DIAGNOSIS — K589 Irritable bowel syndrome without diarrhea: Secondary | ICD-10-CM | POA: Diagnosis not present

## 2021-01-28 DIAGNOSIS — S32010D Wedge compression fracture of first lumbar vertebra, subsequent encounter for fracture with routine healing: Secondary | ICD-10-CM | POA: Diagnosis not present

## 2021-01-28 DIAGNOSIS — M1991 Primary osteoarthritis, unspecified site: Secondary | ICD-10-CM | POA: Diagnosis not present

## 2021-01-28 DIAGNOSIS — L409 Psoriasis, unspecified: Secondary | ICD-10-CM | POA: Diagnosis not present

## 2021-01-28 DIAGNOSIS — F32A Depression, unspecified: Secondary | ICD-10-CM | POA: Diagnosis not present

## 2021-01-28 DIAGNOSIS — R32 Unspecified urinary incontinence: Secondary | ICD-10-CM | POA: Diagnosis not present

## 2021-01-28 DIAGNOSIS — I059 Rheumatic mitral valve disease, unspecified: Secondary | ICD-10-CM | POA: Diagnosis not present

## 2021-01-28 DIAGNOSIS — M899 Disorder of bone, unspecified: Secondary | ICD-10-CM | POA: Diagnosis not present

## 2021-01-28 DIAGNOSIS — E039 Hypothyroidism, unspecified: Secondary | ICD-10-CM | POA: Diagnosis not present

## 2021-01-28 DIAGNOSIS — E785 Hyperlipidemia, unspecified: Secondary | ICD-10-CM | POA: Diagnosis not present

## 2021-01-28 DIAGNOSIS — M797 Fibromyalgia: Secondary | ICD-10-CM | POA: Diagnosis not present

## 2021-01-28 DIAGNOSIS — E559 Vitamin D deficiency, unspecified: Secondary | ICD-10-CM | POA: Diagnosis not present

## 2021-01-28 DIAGNOSIS — E539 Vitamin B deficiency, unspecified: Secondary | ICD-10-CM | POA: Diagnosis not present

## 2021-01-28 DIAGNOSIS — J45909 Unspecified asthma, uncomplicated: Secondary | ICD-10-CM | POA: Diagnosis not present

## 2021-01-28 DIAGNOSIS — Z9889 Other specified postprocedural states: Secondary | ICD-10-CM | POA: Diagnosis not present

## 2021-02-27 DIAGNOSIS — E559 Vitamin D deficiency, unspecified: Secondary | ICD-10-CM | POA: Diagnosis not present

## 2021-02-27 DIAGNOSIS — M1991 Primary osteoarthritis, unspecified site: Secondary | ICD-10-CM | POA: Diagnosis not present

## 2021-02-27 DIAGNOSIS — R32 Unspecified urinary incontinence: Secondary | ICD-10-CM | POA: Diagnosis not present

## 2021-02-27 DIAGNOSIS — F32A Depression, unspecified: Secondary | ICD-10-CM | POA: Diagnosis not present

## 2021-02-27 DIAGNOSIS — M899 Disorder of bone, unspecified: Secondary | ICD-10-CM | POA: Diagnosis not present

## 2021-02-27 DIAGNOSIS — S32010D Wedge compression fracture of first lumbar vertebra, subsequent encounter for fracture with routine healing: Secondary | ICD-10-CM | POA: Diagnosis not present

## 2021-02-27 DIAGNOSIS — L409 Psoriasis, unspecified: Secondary | ICD-10-CM | POA: Diagnosis not present

## 2021-02-27 DIAGNOSIS — M797 Fibromyalgia: Secondary | ICD-10-CM | POA: Diagnosis not present

## 2021-02-27 DIAGNOSIS — Z9889 Other specified postprocedural states: Secondary | ICD-10-CM | POA: Diagnosis not present

## 2021-02-27 DIAGNOSIS — E785 Hyperlipidemia, unspecified: Secondary | ICD-10-CM | POA: Diagnosis not present

## 2021-02-27 DIAGNOSIS — E539 Vitamin B deficiency, unspecified: Secondary | ICD-10-CM | POA: Diagnosis not present

## 2021-02-27 DIAGNOSIS — Z7982 Long term (current) use of aspirin: Secondary | ICD-10-CM | POA: Diagnosis not present

## 2021-02-27 DIAGNOSIS — I059 Rheumatic mitral valve disease, unspecified: Secondary | ICD-10-CM | POA: Diagnosis not present

## 2021-02-27 DIAGNOSIS — E039 Hypothyroidism, unspecified: Secondary | ICD-10-CM | POA: Diagnosis not present

## 2021-02-27 DIAGNOSIS — J45909 Unspecified asthma, uncomplicated: Secondary | ICD-10-CM | POA: Diagnosis not present

## 2021-02-27 DIAGNOSIS — K589 Irritable bowel syndrome without diarrhea: Secondary | ICD-10-CM | POA: Diagnosis not present

## 2021-03-21 ENCOUNTER — Encounter: Payer: Self-pay | Admitting: Family Medicine

## 2021-03-22 ENCOUNTER — Other Ambulatory Visit: Payer: Self-pay

## 2021-03-22 ENCOUNTER — Inpatient Hospital Stay
Admission: EM | Admit: 2021-03-22 | Discharge: 2021-03-25 | DRG: 871 | Disposition: A | Discharge status: Home Health | Payer: Medicare Other | Attending: Internal Medicine | Admitting: Internal Medicine

## 2021-03-22 ENCOUNTER — Emergency Department: Payer: Medicare Other

## 2021-03-22 ENCOUNTER — Telehealth: Payer: Self-pay

## 2021-03-22 DIAGNOSIS — R000 Tachycardia, unspecified: Secondary | ICD-10-CM | POA: Diagnosis not present

## 2021-03-22 DIAGNOSIS — Z8679 Personal history of other diseases of the circulatory system: Secondary | ICD-10-CM | POA: Diagnosis present

## 2021-03-22 DIAGNOSIS — Z825 Family history of asthma and other chronic lower respiratory diseases: Secondary | ICD-10-CM

## 2021-03-22 DIAGNOSIS — Z20822 Contact with and (suspected) exposure to covid-19: Secondary | ICD-10-CM | POA: Diagnosis present

## 2021-03-22 DIAGNOSIS — Z885 Allergy status to narcotic agent status: Secondary | ICD-10-CM

## 2021-03-22 DIAGNOSIS — Z803 Family history of malignant neoplasm of breast: Secondary | ICD-10-CM

## 2021-03-22 DIAGNOSIS — Z9109 Other allergy status, other than to drugs and biological substances: Secondary | ICD-10-CM

## 2021-03-22 DIAGNOSIS — Z7982 Long term (current) use of aspirin: Secondary | ICD-10-CM

## 2021-03-22 DIAGNOSIS — J45909 Unspecified asthma, uncomplicated: Secondary | ICD-10-CM | POA: Diagnosis present

## 2021-03-22 DIAGNOSIS — J984 Other disorders of lung: Secondary | ICD-10-CM | POA: Diagnosis not present

## 2021-03-22 DIAGNOSIS — I4891 Unspecified atrial fibrillation: Secondary | ICD-10-CM | POA: Diagnosis present

## 2021-03-22 DIAGNOSIS — Z888 Allergy status to other drugs, medicaments and biological substances status: Secondary | ICD-10-CM

## 2021-03-22 DIAGNOSIS — M797 Fibromyalgia: Secondary | ICD-10-CM | POA: Diagnosis present

## 2021-03-22 DIAGNOSIS — F32A Depression, unspecified: Secondary | ICD-10-CM | POA: Diagnosis present

## 2021-03-22 DIAGNOSIS — J189 Pneumonia, unspecified organism: Secondary | ICD-10-CM | POA: Diagnosis present

## 2021-03-22 DIAGNOSIS — E876 Hypokalemia: Secondary | ICD-10-CM | POA: Diagnosis present

## 2021-03-22 DIAGNOSIS — R509 Fever, unspecified: Secondary | ICD-10-CM | POA: Diagnosis not present

## 2021-03-22 DIAGNOSIS — I959 Hypotension, unspecified: Secondary | ICD-10-CM | POA: Diagnosis present

## 2021-03-22 DIAGNOSIS — M1990 Unspecified osteoarthritis, unspecified site: Secondary | ICD-10-CM | POA: Diagnosis present

## 2021-03-22 DIAGNOSIS — Z86010 Personal history of colonic polyps: Secondary | ICD-10-CM | POA: Diagnosis not present

## 2021-03-22 DIAGNOSIS — L409 Psoriasis, unspecified: Secondary | ICD-10-CM | POA: Diagnosis present

## 2021-03-22 DIAGNOSIS — Z800 Family history of malignant neoplasm of digestive organs: Secondary | ICD-10-CM

## 2021-03-22 DIAGNOSIS — R2681 Unsteadiness on feet: Secondary | ICD-10-CM

## 2021-03-22 DIAGNOSIS — A419 Sepsis, unspecified organism: Secondary | ICD-10-CM | POA: Diagnosis not present

## 2021-03-22 DIAGNOSIS — E785 Hyperlipidemia, unspecified: Secondary | ICD-10-CM | POA: Diagnosis present

## 2021-03-22 DIAGNOSIS — E039 Hypothyroidism, unspecified: Secondary | ICD-10-CM | POA: Diagnosis not present

## 2021-03-22 DIAGNOSIS — J9811 Atelectasis: Secondary | ICD-10-CM | POA: Diagnosis present

## 2021-03-22 DIAGNOSIS — Z832 Family history of diseases of the blood and blood-forming organs and certain disorders involving the immune mechanism: Secondary | ICD-10-CM

## 2021-03-22 DIAGNOSIS — R918 Other nonspecific abnormal finding of lung field: Secondary | ICD-10-CM | POA: Diagnosis not present

## 2021-03-22 DIAGNOSIS — Z96651 Presence of right artificial knee joint: Secondary | ICD-10-CM | POA: Diagnosis present

## 2021-03-22 DIAGNOSIS — Z8249 Family history of ischemic heart disease and other diseases of the circulatory system: Secondary | ICD-10-CM

## 2021-03-22 DIAGNOSIS — Z79890 Hormone replacement therapy (postmenopausal): Secondary | ICD-10-CM

## 2021-03-22 DIAGNOSIS — Z8673 Personal history of transient ischemic attack (TIA), and cerebral infarction without residual deficits: Secondary | ICD-10-CM | POA: Diagnosis not present

## 2021-03-22 DIAGNOSIS — Z823 Family history of stroke: Secondary | ICD-10-CM

## 2021-03-22 DIAGNOSIS — Z79899 Other long term (current) drug therapy: Secondary | ICD-10-CM

## 2021-03-22 LAB — CBC WITH DIFFERENTIAL/PLATELET
Abs Immature Granulocytes: 0.02 10*3/uL (ref 0.00–0.07)
Basophils Absolute: 0 10*3/uL (ref 0.0–0.1)
Basophils Relative: 0 %
Eosinophils Absolute: 0.1 10*3/uL (ref 0.0–0.5)
Eosinophils Relative: 1 %
HCT: 35.6 % — ABNORMAL LOW (ref 36.0–46.0)
Hemoglobin: 11.9 g/dL — ABNORMAL LOW (ref 12.0–15.0)
Immature Granulocytes: 0 %
Lymphocytes Relative: 19 %
Lymphs Abs: 1.4 10*3/uL (ref 0.7–4.0)
MCH: 31.4 pg (ref 26.0–34.0)
MCHC: 33.4 g/dL (ref 30.0–36.0)
MCV: 93.9 fL (ref 80.0–100.0)
Monocytes Absolute: 0.7 10*3/uL (ref 0.1–1.0)
Monocytes Relative: 10 %
Neutro Abs: 4.8 10*3/uL (ref 1.7–7.7)
Neutrophils Relative %: 70 %
Platelets: 189 10*3/uL (ref 150–400)
RBC: 3.79 MIL/uL — ABNORMAL LOW (ref 3.87–5.11)
RDW: 12.8 % (ref 11.5–15.5)
WBC: 7 10*3/uL (ref 4.0–10.5)
nRBC: 0 % (ref 0.0–0.2)

## 2021-03-22 LAB — URINALYSIS, COMPLETE (UACMP) WITH MICROSCOPIC
Bacteria, UA: NONE SEEN
Bilirubin Urine: NEGATIVE
Glucose, UA: NEGATIVE mg/dL
Ketones, ur: 80 mg/dL — AB
Nitrite: NEGATIVE
Protein, ur: 30 mg/dL — AB
Specific Gravity, Urine: 1.027 (ref 1.005–1.030)
pH: 6 (ref 5.0–8.0)

## 2021-03-22 LAB — PROTIME-INR
INR: 1.1 (ref 0.8–1.2)
Prothrombin Time: 13.8 seconds (ref 11.4–15.2)

## 2021-03-22 LAB — TSH: TSH: 0.452 u[IU]/mL (ref 0.350–4.500)

## 2021-03-22 LAB — COMPREHENSIVE METABOLIC PANEL
ALT: 19 U/L (ref 0–44)
AST: 30 U/L (ref 15–41)
Albumin: 3.4 g/dL — ABNORMAL LOW (ref 3.5–5.0)
Alkaline Phosphatase: 73 U/L (ref 38–126)
Anion gap: 9 (ref 5–15)
BUN: 17 mg/dL (ref 8–23)
CO2: 22 mmol/L (ref 22–32)
Calcium: 8.8 mg/dL — ABNORMAL LOW (ref 8.9–10.3)
Chloride: 107 mmol/L (ref 98–111)
Creatinine, Ser: 0.7 mg/dL (ref 0.44–1.00)
GFR, Estimated: >60 mL/min (ref >60)
Glucose, Bld: 117 mg/dL — ABNORMAL HIGH (ref 70–99)
Potassium: 4.6 mmol/L (ref 3.5–5.1)
Sodium: 138 mmol/L (ref 135–145)
Total Bilirubin: 1.4 mg/dL — ABNORMAL HIGH (ref 0.3–1.2)
Total Protein: 7.1 g/dL (ref 6.5–8.1)

## 2021-03-22 LAB — RESP PANEL BY RT-PCR (FLU A&B, COVID) ARPGX2
Influenza A by PCR: NEGATIVE
Influenza B by PCR: NEGATIVE
SARS Coronavirus 2 by RT PCR: NEGATIVE

## 2021-03-22 LAB — LACTIC ACID, PLASMA
Lactic Acid, Venous: 1.2 mmol/L (ref 0.5–1.9)
Lactic Acid, Venous: 1.1 mmol/L (ref 0.5–1.9)

## 2021-03-22 LAB — APTT: aPTT: 32 seconds (ref 24–36)

## 2021-03-22 MED ORDER — VANCOMYCIN HCL IN DEXTROSE 1-5 GM/200ML-% IV SOLN
1000.0000 mg | Freq: Once | INTRAVENOUS | Status: AC
  Administered 2021-03-22: 1000 mg via INTRAVENOUS
  Filled 2021-03-22: qty 200

## 2021-03-22 MED ORDER — LACTATED RINGERS IV SOLN: INTRAVENOUS | Status: AC

## 2021-03-22 MED ORDER — ASPIRIN EC 325 MG PO TBEC
325.0000 mg | DELAYED_RELEASE_TABLET | Freq: Every day | ORAL | Status: DC
  Administered 2021-03-23 – 2021-03-25 (×3): 325 mg via ORAL
  Filled 2021-03-22 (×3): qty 1

## 2021-03-22 MED ORDER — SODIUM CHLORIDE 0.9 % IV BOLUS
1000.0000 mL | Freq: Once | INTRAVENOUS | Status: AC
  Administered 2021-03-22: 1000 mL via INTRAVENOUS

## 2021-03-22 MED ORDER — ACETAMINOPHEN 650 MG RE SUPP: 650.0000 mg | Freq: Four times a day (QID) | RECTAL | Status: DC | PRN

## 2021-03-22 MED ORDER — ASCORBIC ACID 500 MG PO TABS
2000.0000 mg | ORAL_TABLET | Freq: Every evening | ORAL | Status: DC
  Administered 2021-03-22 – 2021-03-24 (×3): 2000 mg via ORAL
  Filled 2021-03-22 (×3): qty 4

## 2021-03-22 MED ORDER — ENOXAPARIN SODIUM 80 MG/0.8ML ~~LOC~~ SOLN
70.0000 mg | Freq: Two times a day (BID) | SUBCUTANEOUS | Status: DC
  Administered 2021-03-22 – 2021-03-25 (×6): 70 mg via SUBCUTANEOUS
  Filled 2021-03-22 (×6): qty 0.8

## 2021-03-22 MED ORDER — FIBER ADULT GUMMIES 2 G PO CHEW: CHEWABLE_TABLET | Freq: Every day | ORAL | Status: DC

## 2021-03-22 MED ORDER — SODIUM CHLORIDE 0.9 % IV SOLN
2.0000 g | Freq: Once | INTRAVENOUS | Status: AC
  Administered 2021-03-22: 2 g via INTRAVENOUS
  Filled 2021-03-22: qty 2

## 2021-03-22 MED ORDER — APOAEQUORIN 10 MG PO CAPS: 10.0000 mg | ORAL_CAPSULE | Freq: Every day | ORAL | Status: DC

## 2021-03-22 MED ORDER — ACETAMINOPHEN 325 MG PO TABS
650.0000 mg | ORAL_TABLET | Freq: Four times a day (QID) | ORAL | Status: DC | PRN
  Administered 2021-03-23 – 2021-03-25 (×5): 650 mg via ORAL
  Filled 2021-03-22 (×5): qty 2

## 2021-03-22 MED ORDER — LACTATED RINGERS IV SOLN: INTRAVENOUS | Status: DC

## 2021-03-22 MED ORDER — VITAMIN D 25 MCG (1000 UNIT) PO TABS
ORAL_TABLET | Freq: Every day | ORAL | Status: DC
  Administered 2021-03-22 – 2021-03-25 (×4): 1000 [IU] via ORAL
  Filled 2021-03-22 (×4): qty 1

## 2021-03-22 MED ORDER — ENOXAPARIN SODIUM 40 MG/0.4ML ~~LOC~~ SOLN: 40.0000 mg | SUBCUTANEOUS | Status: DC

## 2021-03-22 MED ORDER — METRONIDAZOLE IN NACL 5-0.79 MG/ML-% IV SOLN
500.0000 mg | Freq: Once | INTRAVENOUS | Status: AC
  Administered 2021-03-22: 500 mg via INTRAVENOUS
  Filled 2021-03-22: qty 100

## 2021-03-22 MED ORDER — DEXTROMETHORPHAN POLISTIREX ER 30 MG/5ML PO SUER
15.0000 mg | Freq: Every evening | ORAL | Status: DC | PRN
  Administered 2021-03-23 – 2021-03-24 (×3): 15 mg via ORAL
  Filled 2021-03-22 (×4): qty 5

## 2021-03-22 MED ORDER — GUAIFENESIN 100 MG/5ML PO SOLN
5.0000 mL | ORAL | Status: DC | PRN
  Administered 2021-03-22 – 2021-03-25 (×4): 100 mg via ORAL
  Filled 2021-03-22 (×5): qty 5

## 2021-03-22 MED ORDER — ONDANSETRON HCL 4 MG PO TABS: 4.0000 mg | ORAL_TABLET | Freq: Four times a day (QID) | ORAL | Status: DC | PRN

## 2021-03-22 MED ORDER — SODIUM CHLORIDE 0.9 % IV SOLN
2.0000 g | INTRAVENOUS | Status: DC
  Administered 2021-03-22 – 2021-03-24 (×3): 2 g via INTRAVENOUS
  Filled 2021-03-22: qty 20
  Filled 2021-03-22: qty 2
  Filled 2021-03-22: qty 20
  Filled 2021-03-22: qty 2

## 2021-03-22 MED ORDER — LACTINEX PO CHEW
1.0000 | CHEWABLE_TABLET | Freq: Three times a day (TID) | ORAL | Status: DC
  Filled 2021-03-22 (×4): qty 1

## 2021-03-22 MED ORDER — SODIUM CHLORIDE 0.9 % IV SOLN
500.0000 mg | INTRAVENOUS | Status: DC
  Administered 2021-03-22 – 2021-03-24 (×3): 500 mg via INTRAVENOUS
  Filled 2021-03-22 (×5): qty 500

## 2021-03-22 MED ORDER — ONDANSETRON HCL 4 MG/2ML IJ SOLN
4.0000 mg | Freq: Four times a day (QID) | INTRAMUSCULAR | Status: DC | PRN
  Administered 2021-03-23: 4 mg via INTRAVENOUS
  Filled 2021-03-22: qty 2

## 2021-03-22 MED ORDER — SODIUM CHLORIDE 0.9 % IV BOLUS (SEPSIS)
1000.0000 mL | Freq: Once | INTRAVENOUS | Status: AC
  Administered 2021-03-22: 1000 mL via INTRAVENOUS

## 2021-03-22 MED ORDER — ALBUTEROL SULFATE (2.5 MG/3ML) 0.083% IN NEBU: 2.5000 mg | INHALATION_SOLUTION | Freq: Four times a day (QID) | RESPIRATORY_TRACT | Status: DC | PRN

## 2021-03-22 MED ORDER — LEVOTHYROXINE SODIUM 88 MCG PO TABS
88.0000 ug | ORAL_TABLET | Freq: Every day | ORAL | Status: DC
  Administered 2021-03-23 – 2021-03-24 (×2): 88 ug via ORAL
  Filled 2021-03-22 (×5): qty 1

## 2021-03-22 NOTE — Telephone Encounter (Signed)
[facility] - Client TELEPHONE ADVICE RECORD [facility] Patient Name: Carla Kelly Gender: Female DOB: Apr 10, 1933 Age: 87 Y 2 M 11 D Return Phone Number: [phone] (Primary), [phone] (Secondary) Address: City/ State/ Zip: [place] [address] Client [place] [facility] - Client Client Site [place] MD Contact Type Call Who Is Calling Patient / Member / Family / Caregiver Call Type Triage / Clinical Caller Name Mat Carne Relationship To Patient Daughter Return Phone Number [phone] (Primary) Chief Complaint Heart palpitations or irregular heartbeat Reason for Call Symptomatic / Request for Health Information Initial Comment Caller states mother has had fever for 5 days (100.5/98.5/98.4/101.2), coughing up phylum, headache, and has appointment tomorrow virtually. O2 is 95 pulse 136. HR acuity 2 per charge. [place] ED Translation No Nurse Assessment Nurse: Harvie Bridge, RN, Beth Date/Time (Eastern Time): 03/22/2021 10:52:08 AM Confirm and document reason for call. If symptomatic, describe symptoms. ---Caller states mother has had fever for 5 days (100.5/98.5/98.4/101.2) Temp 100.7, coughing up phlegm, headache, and has appointment tomorrow virtually. O2 is 96%, pulse 148 - HR . Does the patient have any new or worsening symptoms? ---Yes Will a triage be completed? ---Yes Related visit to physician within the last 2 weeks? ---No Does the PT have any chronic conditions? (i.e. diabetes, asthma, this includes High risk factors for pregnancy, etc.) ---Yes List chronic conditions. ---asthma hypothyriod Is this a behavioral health or substance abuse call? ---No Guidelines Guideline Title Affirmed Question Affirmed Notes Nurse Date/Time (Eastern Time) Heart Rate and Heartbeat Questions [1] Heart beating very rapidly (e.g., > 140 /  minute) AND [2] present now [place], RN, [place] 03/22/2021 10:58:55 AM PLEASE NOTE: All timestamps contained within this report are represented as [place] Standard Time. CONFIDENTIALTY NOTICE: This fax transmission is intended only for the addressee. It contains information that is legally privileged, confidential or otherwise protected from use or disclosure. If you are not the intended recipient, you are strictly prohibited from reviewing, disclosing, copying using or disseminating any of this information or taking any action in reliance on or regarding this information. If you have received this fax in error, please notify us immediately by telephone so that we can arrange for its return to [place]. Phone: [phone], Toll-Free: [phone], Fax: [phone] Page: 2 of 2 Call Id: [id number] Guidelines Guideline Title Affirmed Question Affirmed Notes Nurse Date/Time Eilene Ghazi Time) (Exception: during exercise) Disp. Time Eilene Ghazi Time) Disposition Final User 03/22/2021 11:00:02 AM Go to ED Now Yes [place], RN, Beth Caller Disagree/Comply Comply Caller Understands Yes PreDisposition InappropriateToAsk Care Advice Given Per Guideline GO TO ED NOW: * You need to be seen in the Emergency Department. NOTE TO TRIAGER - DRIVING: * Another adult should drive. ANOTHER ADULT SHOULD DRIVE: * It is better and safer if another adult drives instead of you. BRING MEDICINES: * Bring a list of your current medicines when you go to the Emergency Department (ER). CARE ADVICE given per Heart Rate and Heartbeat Questions (Adult) guideline. Comments User: Louretta Shorten, RN Date/Time Eilene Ghazi Time): 03/22/2021 11:00:58 AM HR between 142 to 168 per daughter Referrals GO TO FACILITY OTHER - SPECIFY

## 2021-03-22 NOTE — ED Provider Notes (Signed)
[facility] Emergency Department Provider Note  Time seen: 1:33 PM  I have reviewed the triage vital signs and the nursing notes.  HISTORY  Chief Complaint Fever and Tachycardia   HPI Carla Kelly is a 87 y.o. female with a past medical history of asthma, depression, fibromyalgia, hyperlipidemia, arthritis, presents to the emergency department for fever and cough.  According to the patient she has been experiencing fever as high as 102.1 at home.  Patient states over the past 3 days she has had a persistent cough now with thick sputum production.  Upon arrival patient found to be tachycardic and tachypneic.  Patient denies any vomiting or diarrhea.  No chest pain or abdominal pain.  Past Medical History:  Diagnosis Date  . Adenomatous colon polyp   . Anemia   . Asthma   . Depression   . Disorder of bone and cartilage, unspecified   . Embolism and thrombosis of unspecified site   . Fibromyalgia   . History of herpes zoster   . History of hiatal hernia   . HLD (hyperlipidemia)   . Hypoglycemia   . Hypothyroidism   . IBS (irritable bowel syndrome)   . Mitral valve disorder    "leaking mitral valve-very mild"  . Orbital floor fracture ([place]) 10/16   with a fall   . Osteoarthritis   . Osteoarthrosis, unspecified whether generalized or localized, unspecified site   . Other B-complex deficiencies   . Psoriasis   . Stroke ([place])   . Swelling of extremity, right    right leg  . 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 Unspecified urinary incontinence   . Unspecified vitamin D deficiency     Patient Active Problem List   Diagnosis Date Noted  . Constipation 10/03/2020  . Closed compression fracture of L1 vertebra ([place]) 09/26/2020  . Hearing loss 07/06/2020  . Hammer toe of right foot 03/12/2018  . Screening mammogram, encounter for 03/04/2017  . Routine general medical examination at a health care facility 02/12/2016  . Estrogen deficiency 02/15/2015   . Colon cancer screening 02/15/2015  . Arthritis of right knee 08/13/2014  . Status post total right knee replacement 08/13/2014  . Transient global amnesia 02/20/2014  . Encounter for Medicare annual wellness exam 12/07/2013  . Psoriasis 02/16/2013  . Other screening mammogram 08/19/2012  . Pedal edema 03/11/2012  . B12 deficiency 09/05/2009  . Vitamin D deficiency 11/08/2008  . Hyperlipidemia 08/25/2008  . Osteoporosis 08/25/2008  . History of colonic polyps 08/25/2008  . HERPES ZOSTER, HX OF 06/03/2008  . Hypothyroidism 08/04/2007  . HYPOGLYCEMIA 08/04/2007  . ASTHMA 08/04/2007  . IRRITABLE BOWEL SYNDROME 08/04/2007  . VAGINITIS, ATROPHIC 08/04/2007  . Osteoarthrosis, unspecified whether generalized or localized, unspecified site 08/04/2007  . FIBROMYALGIA 08/04/2007  . URINARY INCONTINENCE 08/04/2007    Past Surgical History:  Procedure Laterality Date  . ABDOMINAL HYSTERECTOMY  1970   fibroids; endometriosis  . APPENDECTOMY    . bladder tack  5/05  . BREAST BIOPSY Right 1995  . BREAST CYST ASPIRATION Left 03/17/2015  . CATARACT EXTRACTION Right 12/13   Dr George Ina  . CATARACT EXTRACTION W/PHACO Left 05/10/2015   Procedure: CATARACT EXTRACTION PHACO AND INTRAOCULAR LENS PLACEMENT (IOC);  Surgeon: Birder Robson, MD;  Location: [facility] ORS;  Service: Ophthalmology;  Laterality: Left;  [place] 00:59 AP% 26.7 CDE 15.85 fluid pack lot#[id number]  . CERVICAL DISC SURGERY     x 2  . CHOLECYSTECTOMY  2004  . COLONOSCOPY  2/02  polyps, BE-polyps  . EYE SURGERY     right eye  . JOINT REPLACEMENT    . KYPHOPLASTY N/A 11/08/2020   Procedure: L1 Kyphoplasty;  Surgeon: Hessie Knows, MD;  Location: [facility] ORS;  Service: Orthopedics;  Laterality: N/A;  . MOUTH SURGERY     lower teeth have 4 studs to keep in place  . TONSILLECTOMY    . TOTAL KNEE ARTHROPLASTY Right 08/13/2014   Procedure: RIGHT TOTAL KNEE ARTHROPLASTY;  Surgeon: Mcarthur Rossetti, MD;  Location: [facility] ORS;   Service: Orthopedics;  Laterality: Right;  . TYMPANOPLASTY    . US TRANSVAGINAL PELVIC MODIFIED  10/02   small left ovarian cyst    Prior to Admission medications   Medication Sig Start Date End Date Taking? Authorizing Provider  acetaminophen (TYLENOL) 500 MG tablet Take 1,000 mg by mouth 3 (three) times daily.    [provider]  albuterol (PROVENTIL) 4 MG tablet TAKE 1 TABLET(4 MG) BY MOUTH EVERY 4 HOURS AS NEEDED Patient taking differently: Take 4 mg by mouth every 4 (four) hours as needed for shortness of breath or wheezing. TAKE 1 TABLET(4 MG) BY MOUTH EVERY 4 HOURS AS NEEDED 07/12/20   Tower, Wynelle Fanny, MD  Apoaequorin (PREVAGEN) 10 MG CAPS Take 10 mg by mouth daily. 06/30/18   [provider]  Ascorbic Acid (VITAMIN C) 1000 MG tablet Take 2,000 mg by mouth every evening. 01/25/20   [provider]  aspirin 325 MG tablet Take 325 mg by mouth daily.    [provider]  Calcium Carbonate-Vit D-Min (CALTRATE 600+D PLUS MINERALS) 600-800 MG-UNIT TABS Take 1 capsule by mouth daily.  03/10/18   [provider]  Calcium-Magnesium-Zinc (CAL-MAG-ZINC PO) Take 1 tablet by mouth in the morning, at noon, and at bedtime.    [provider]  Cholecalciferol (VITAMIN D3 GUMMIES PO) Take 2 tablets by mouth daily. 03/10/18   [provider]  diazepam (VALIUM) 5 MG tablet TAKE 1 TABLET BY MOUTH AT BEDTIME AS NEEDED FOR ANXIETY Patient taking differently: Take 5 mg by mouth at bedtime. 09/13/20   Tower, Wynelle Fanny, MD  FIBER ADULT GUMMIES PO Take 2 tablets by mouth daily.    [provider]  furosemide (LASIX) 40 MG tablet Take 0.5 tablets (20 mg total) by mouth 2 (two) times daily. Patient taking differently: Take 20 mg by mouth daily. 10/30/19   Tower, Wynelle Fanny, MD  HYDROcodone-acetaminophen (NORCO) 5-325 MG tablet Take 1 tablet by mouth every 6 (six) hours as needed. 11/08/20   Hessie Knows, MD  Omega-3 Fatty Acids (FISH OIL) 1000 MG CAPS Take  1,000 mg by mouth daily. 11/09/18   [provider]  Probiotic Product (ALIGN) 4 MG CAPS Take 4 mg by mouth daily.    [provider]  SYNTHROID 88 MCG tablet TAKE 1 TABLET(88 MCG) BY MOUTH DAILY BEFORE BREAKFAST 12/13/20   Tower, Wynelle Fanny, MD    Allergies  Allergen Reactions  . Alcohol-Sulfur [Elemental Sulfur] Shortness Of Breath and Swelling    Alcohol po swells throat  . Ergocalciferol Other (See Comments)    REACTION: GI side effects  . Morphine Nausea And Vomiting  . Tolterodine Tartrate Other (See Comments)    REACTION: ? reaction  . Alcohol Other (See Comments) and Swelling  . Myrbetriq [Mirabegron] Other (See Comments)    GI Upset  . Nortriptyline Hcl Other (See Comments)    unknown    Family History  Problem Relation Age of Onset  .  Heart attack Mother   . Stroke Mother        several  . Lupus Brother   . Other Sister        benign brain tumor  . Asthma Daughter   . Breast cancer Daughter   . Other Brother        heart problems  . Colon cancer Other        1st cousin    Social History Social History   Tobacco Use  . Smoking status: Never Smoker  . Smokeless tobacco: Never Used  Vaping Use  . Vaping Use: Never used  Substance Use Topics  . Alcohol use: No    Comment: "is allergic"  . Drug use: No    Review of Systems Constitutional: Fever to 102.1 at home. Cardiovascular: Negative for chest pain. Respiratory: Mild shortness of breath.  Positive for cough productive of sputum. Gastrointestinal: Negative for abdominal pain, vomiting  Musculoskeletal: Negative for musculoskeletal complaints Neurological: Negative for headache All other ROS negative  ____________________________________________   PHYSICAL EXAM:  VITAL SIGNS: ED Triage Vitals  Enc Vitals Group     BP 03/22/21 1229 (!) 86/69     Pulse Rate 03/22/21 1229 (!) 142     Resp 03/22/21 1229 18     Temp 03/22/21 1229 98.5 F (36.9 C)     Temp Source 03/22/21 1229  Oral     SpO2 03/22/21 1229 95 %     Weight 03/22/21 1229 148 lb (67.1 kg)     Height 03/22/21 1229 5\' 4"  (1.626 m)     Head Circumference --      Peak Flow --      Pain Score 03/22/21 1229 0     Pain Loc --      Pain Edu? --      Excl. in [place]? --    Constitutional: Alert and oriented.  No acute distress.  Occasional cough during exam. Eyes: Normal exam ENT      Head: Normocephalic and atraumatic.      Mouth/Throat: Mucous membranes are moist. Cardiovascular: The rhythm rate around 110 bpm.  No obvious murmur. Respiratory: Mild tachypnea.  No significant increased respiratory effort.  Breath sounds are clear and occasional cough during examination. Gastrointestinal: Soft and nontender. No distention.   Musculoskeletal: Nontender with normal range of motion in all extremities.  Neurologic:  Normal speech and language. No gross focal neurologic deficits  Skin:  Skin is warm, dry and intact.  Psychiatric: Mood and affect are normal.  ____________________________________________    EKG  EKG viewed and interpreted by myself shows atrial fibrillation with rapid ventricular response at 146 bpm with a narrow QRS, normal axis, normal intervals, nonspecific ST changes.  ____________________________________________    RADIOLOGY  IMPRESSION:  1. Increased interstitial markings at the lung bases, may be chronic  and related to interstitial changes, less likely mild edema.  2. Linear airspace disease at the LEFT lung base, likely  atelectasis.  3. Nodular density at the RIGHT upper chest. This may represent  confluence of shadows or developing infection, nodule in this area  not excluded given asymmetry. Consider follow-up PA and lateral  chest after resolution of symptoms in short interval to ensure  resolution.  4. Question of interval loss of height at T12 since the end of 2021.  Correlate with any pain in this area. Signs of prior L1 kyphoplasty.    ____________________________________________   INITIAL IMPRESSION / ASSESSMENT AND PLAN / ED COURSE  Pertinent labs & imaging results that were available during my care of the patient were reviewed by me and considered in my medical decision making (see chart for details).   Patient presents to the emergency department for 3 to 4 days of cough now productive of sputum with fever to 102.1 at home.  Patient tachycardic around 150 bpm upon arrival.  Given the patient's reported fever at home tachycardia and tachypnea upon arrival meeting sepsis criteria we will start broad-spectrum antibiotics, IV hydration.  We will check labs, blood cultures and start broad-spectrum antibiotics.  Patient's labs are resulted largely within normal limits only minimally elevated lactic acid.  Patient's pulse rate is improving with IV fluids down to 107 currently.  Blood pressure remained stable 118/60 initially was hypotensive upon arrival.  Patient's chest x-ray shows airspace disease in left lung base could be atelectasis as well as right lung possible developing pneumonia.  Given the patient's fever at home tachycardia and tachypnea we will cover broad-spectrum antibiotics and admit to the hospitalist service for presumed sepsis due to pneumonia.  COVID and flu is pending.  Patient's COVID/flu was negative.  Suspect bacterial pneumonia.  Will admit to the hospital service.  Carla Kelly was evaluated in Emergency Department on 03/22/2021 for the symptoms described in the history of present illness. She was evaluated in the context of the global COVID-19 pandemic, which necessitated consideration that the patient might be at risk for infection with the SARS-CoV-2 virus that causes COVID-19. Institutional protocols and algorithms that pertain to the evaluation of patients at risk for COVID-19 are in a state of rapid change based on information released by regulatory bodies including the CDC and federal and state  organizations. These policies and algorithms were followed during the patient's care in the ED.  CRITICAL CARE Performed by: Harvest Dark   Total critical care time: 30 minutes  Critical care time was exclusive of separately billable procedures and treating other patients.  Critical care was necessary to treat or prevent imminent or life-threatening deterioration.  Critical care was time spent personally by me on the following activities: development of treatment plan with patient and/or surrogate as well as nursing, discussions with consultants, evaluation of patient's response to treatment, examination of patient, obtaining history from patient or surrogate, ordering and performing treatments and interventions, ordering and review of laboratory studies, ordering and review of radiographic studies, pulse oximetry and re-evaluation of patient's condition.  ____________________________________________   FINAL CLINICAL IMPRESSION(S) / ED DIAGNOSES  Pneumonia Sepsis   Harvest Dark, MD 03/22/21 1523

## 2021-03-22 NOTE — Sepsis Progress Note (Signed)
[facility]ink is following this sepsis 

## 2021-03-22 NOTE — ED Notes (Signed)
2nd set of blood cultures obtained at this time, before antibiotics started

## 2021-03-22 NOTE — H&P (Addendum)
History and Physical    Carla Kelly [id number] DOB: December 22, 1932 DOA: 03/22/2021  PCP: Abner Greenspan, MD   Patient coming from: Home  I have personally briefly reviewed patient's old medical records in [place]  Chief Complaint: Fever  HPI: Carla Kelly is a 87 y.o. female with medical history significant for depression, asthma, dyslipidemia, hypothyroidism who presents to the ER via EMS for evaluation of a fever. Patient states that for the last 3 nights she has had a fever with a T-max of 102 F.  Fever has been associated with a cough productive of greenish-yellow phlegm and congestion.  She had taken multiple home COVID test which were negative.  Patient was noted to be tachycardic upon arrival with heart rate of 140 bpm. She has had a sick contact and states that her son-in-law has had an upper respiratory tract infection. She denies having any chest pain, no shortness of breath, no abdominal pain, no nausea, no vomiting, no diarrhea, no urinary frequency, no nocturia, no dysuria, no blurred vision, no anorexia, no palpitations, no diaphoresis, no leg swelling, no orthopnea. Labs show sodium 138, potassium 4.6, chloride 107, bicarb 22, glucose 117, BUN 17, creatinine 0.70, calcium 8.8, alkaline phosphatase 73, albumin 3.4, AST 30, ALT 19, total protein 7.1, total bilirubin 1.4, lactic acid 1.1, white count 7.0, hemoglobin 11.9, hematocrit 35.6, MCV 93.9, RDW 12.8, platelet count 189, PT 13.8, INR 1.1 Respiratory viral panel is negative Chest x-ray reviewed by me shows Increased interstitial markings at the lung bases, may be chronic and related to interstitial changes, less likely mild edema. Linear airspace disease at the LEFT lung base, likely atelectasis. Nodular density at the RIGHT upper chest. This may represent confluence of shadows or developing infection, nodule in this area not excluded given asymmetry. Consider follow-up PA and lateral chest after resolution of  symptoms in short interval to ensure resolution. Question of interval loss of height at T12 since the end of 2021. Correlate with any pain in this area. Signs of prior L1 kyphoplasty. Twelve-lead EKG reviewed by me shows atrial fibrillation with a rapid ventricular rate.  ED Course: Patient is an 87 year old female who presents to the emergency room for evaluation of a fever with a T-max of 102 at home, cough productive of yellowish-green phlegm and tachycardia.  Chest x-ray shows an evolving right upper lobe pneumonia.  Twelve-lead EKG shows new onset atrial fibrillation.  Patient was also hypotensive upon arrival to the ER with systolic blood pressure of 40mmHg.  She responded to IV fluid resuscitation with improvement in her blood pressure to 0000000 systolic.  She received broad-spectrum antibiotic therapy and will be admitted to the hospital for further evaluation.    Review of Systems: As per HPI otherwise all other systems reviewed and negative.    Past Medical History:  Diagnosis Date  . Adenomatous colon polyp   . Anemia   . Asthma   . Depression   . Disorder of bone and cartilage, unspecified   . Embolism and thrombosis of unspecified site   . Fibromyalgia   . History of herpes zoster   . History of hiatal hernia   . HLD (hyperlipidemia)   . Hypoglycemia   . Hypothyroidism   . IBS (irritable bowel syndrome)   . Mitral valve disorder    "leaking mitral valve-very mild"  . Orbital floor fracture ([place]) 10/16   with a fall   . Osteoarthritis   . Osteoarthrosis, unspecified whether generalized or localized,  unspecified site   . Other B-complex deficiencies   . Psoriasis   . Stroke ([place])   . Swelling of extremity, right    right leg  . 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 Unspecified urinary incontinence   . Unspecified vitamin D deficiency     Past Surgical History:  Procedure Laterality Date  . ABDOMINAL HYSTERECTOMY  1970   fibroids; endometriosis  .  APPENDECTOMY    . bladder tack  5/05  . BREAST BIOPSY Right 1995  . BREAST CYST ASPIRATION Left 03/17/2015  . CATARACT EXTRACTION Right 12/13   Dr George Ina  . CATARACT EXTRACTION W/PHACO Left 05/10/2015   Procedure: CATARACT EXTRACTION PHACO AND INTRAOCULAR LENS PLACEMENT (IOC);  Surgeon: Birder Robson, MD;  Location: [facility] ORS;  Service: Ophthalmology;  Laterality: Left;  [place] 00:59 AP% 26.7 CDE 15.85 fluid pack lot#[id number]  . CERVICAL DISC SURGERY     x 2  . CHOLECYSTECTOMY  2004  . COLONOSCOPY  2/02   polyps, BE-polyps  . EYE SURGERY     right eye  . JOINT REPLACEMENT    . KYPHOPLASTY N/A 11/08/2020   Procedure: L1 Kyphoplasty;  Surgeon: Hessie Knows, MD;  Location: [facility] ORS;  Service: Orthopedics;  Laterality: N/A;  . MOUTH SURGERY     lower teeth have 4 studs to keep in place  . TONSILLECTOMY    . TOTAL KNEE ARTHROPLASTY Right 08/13/2014   Procedure: RIGHT TOTAL KNEE ARTHROPLASTY;  Surgeon: Mcarthur Rossetti, MD;  Location: [facility];  Service: Orthopedics;  Laterality: Right;  . TYMPANOPLASTY    . US TRANSVAGINAL PELVIC MODIFIED  10/02   small left ovarian cyst     reports that she has never smoked. She has never used smokeless tobacco. She reports that she does not drink alcohol and does not use drugs.  Allergies  Allergen Reactions  . Alcohol-Sulfur [Elemental Sulfur] Shortness Of Breath and Swelling    Alcohol po swells throat  . Ergocalciferol Other (See Comments)    REACTION: GI side effects  . Morphine Nausea And Vomiting  . Tolterodine Tartrate Other (See Comments)    REACTION: ? reaction  . Alcohol Other (See Comments) and Swelling  . Myrbetriq [Mirabegron] Other (See Comments)    GI Upset  . Nortriptyline Hcl Other (See Comments)    unknown    Family History  Problem Relation Age of Onset  . Heart attack Mother   . Stroke Mother        several  . Lupus Brother   . Other Sister        benign brain tumor  . Asthma Daughter   . Breast cancer  Daughter   . Other Brother        heart problems  . Colon cancer Other        1st cousin      Prior to Admission medications   Medication Sig Start Date End Date Taking? Authorizing Provider  albuterol (PROVENTIL) 4 MG tablet TAKE 1 TABLET(4 MG) BY MOUTH EVERY 4 HOURS AS NEEDED Patient taking differently: Take 4 mg by mouth every 4 (four) hours as needed for shortness of breath or wheezing. TAKE 1 TABLET(4 MG) BY MOUTH EVERY 4 HOURS AS NEEDED 07/12/20  Yes Tower, Marne A, MD  Apoaequorin (PREVAGEN) 10 MG CAPS Take 10 mg by mouth daily. 06/30/18  Yes [provider]  Ascorbic Acid (VITAMIN C) 1000 MG tablet Take 2,000 mg by mouth every evening. 01/25/20  Yes [provider]  aspirin 325 MG tablet Take 325 mg by mouth daily.   Yes [provider]  Calcium-Magnesium-Zinc (CAL-MAG-ZINC PO) Take 1 tablet by mouth in the morning, at noon, and at bedtime.   Yes [provider]  Cholecalciferol (VITAMIN D3 GUMMIES PO) Take 2 tablets by mouth daily. 03/10/18  Yes [provider]  FIBER ADULT GUMMIES PO Take 2 tablets by mouth daily.   Yes [provider]  furosemide (LASIX) 40 MG tablet Take 0.5 tablets (20 mg total) by mouth 2 (two) times daily. Patient taking differently: Take 20 mg by mouth daily. 10/30/19  Yes Tower, Wynelle Fanny, MD  Probiotic Product (ALIGN) 4 MG CAPS Take 4 mg by mouth daily.   Yes [provider]  SYNTHROID 88 MCG tablet TAKE 1 TABLET(88 MCG) BY MOUTH DAILY BEFORE BREAKFAST 12/13/20  Yes Tower, Wynelle Fanny, MD  acetaminophen (TYLENOL) 500 MG tablet Take 1,000 mg by mouth 3 (three) times daily. Patient not taking: Reported on 03/22/2021    [provider]  Calcium Carbonate-Vit D-Min (CALTRATE 600+D PLUS MINERALS) 600-800 MG-UNIT TABS Take 1 capsule by mouth daily.  Patient not taking: Reported on 03/22/2021 03/10/18   [provider]  diazepam (VALIUM) 5 MG tablet TAKE 1 TABLET BY MOUTH AT BEDTIME AS NEEDED FOR  ANXIETY Patient not taking: No sig reported 09/13/20   Tower, Wynelle Fanny, MD  HYDROcodone-acetaminophen (NORCO) 5-325 MG tablet Take 1 tablet by mouth every 6 (six) hours as needed. Patient not taking: Reported on 03/22/2021 11/08/20   Hessie Knows, MD  Omega-3 Fatty Acids (FISH OIL) 1000 MG CAPS Take 1,000 mg by mouth daily. Patient not taking: Reported on 03/22/2021 11/09/18   [provider]    Physical Exam: Vitals:   03/22/21 1500 03/22/21 1517 03/22/21 1600 03/22/21 1630  BP: (!) 135/56  (!) 122/59 (!) 117/53  Pulse:   91 95  Resp: (!) 23  15 18   Temp:      TempSrc:      SpO2: 98% 98% 94% 97%  Weight:      Height:         Vitals:   03/22/21 1500 03/22/21 1517 03/22/21 1600 03/22/21 1630  BP: (!) 135/56  (!) 122/59 (!) 117/53  Pulse:   91 95  Resp: (!) 23  15 18   Temp:      TempSrc:      SpO2: 98% 98% 94% 97%  Weight:      Height:          Constitutional: Alert and oriented x 3. Not in any apparent distress HEENT:      Head: Normocephalic and atraumatic.         Eyes: PERLA, EOMI, Conjunctivae are normal. Sclera is non-icteric.       Mouth/Throat: Mucous membranes are moist.       Neck: Supple with no signs of meningismus. Cardiovascular:  Irregularly irregular. No murmurs, gallops, or rubs. 2+ symmetrical distal pulses are present . No JVD.  trace LE edema Respiratory: Respiratory effort normal .rhonchi over the right upper lung zones no wheezes, crackles, or rhonchi.  Gastrointestinal: Soft, non tender and non distended with positive bowel sounds.  Genitourinary: No CVA tenderness. Musculoskeletal: Nontender with normal range of motion in all extremities. No cyanosis, or erythema of extremities. Neurologic:  Face is symmetric. Moving all extremities. No gross focal neurologic deficits  Skin: Skin is warm, dry.  No rash or ulcers Psychiatric: Mood and affect are normal   Labs on Admission:  I have personally reviewed following labs and imaging  studies  CBC: Recent Labs  Lab 03/22/21 1233  WBC 7.0  NEUTROABS 4.8  HGB 11.9*  HCT 35.6*  MCV 93.9  PLT 272   Basic Metabolic Panel: Recent Labs  Lab 03/22/21 1233  NA 138  K 4.6  CL 107  CO2 22  GLUCOSE 117*  BUN 17  CREATININE 0.70  CALCIUM 8.8*   GFR: Estimated Creatinine Clearance: 47.6 mL/min (by C-G formula based on SCr of 0.7 mg/dL). Liver Function Tests: Recent Labs  Lab 03/22/21 1233  AST 30  ALT 19  ALKPHOS 73  BILITOT 1.4*  PROT 7.1  ALBUMIN 3.4*   No results for input(s): LIPASE, AMYLASE in the last 168 hours. No results for input(s): AMMONIA in the last 168 hours. Coagulation Profile: Recent Labs  Lab 03/22/21 1233  INR 1.1   Cardiac Enzymes: No results for input(s): CKTOTAL, CKMB, CKMBINDEX, TROPONINI in the last 168 hours. BNP (last 3 results) No results for input(s): PROBNP in the last 8760 hours. HbA1C: No results for input(s): HGBA1C in the last 72 hours. CBG: No results for input(s): GLUCAP in the last 168 hours. Lipid Profile: No results for input(s): CHOL, HDL, LDLCALC, TRIG, CHOLHDL, LDLDIRECT in the last 72 hours. Thyroid Function Tests: No results for input(s): TSH, T4TOTAL, FREET4, T3FREE, THYROIDAB in the last 72 hours. Anemia Panel: No results for input(s): VITAMINB12, FOLATE, FERRITIN, TIBC, IRON, RETICCTPCT in the last 72 hours. Urine analysis:    Component Value Date/Time   COLORURINE YELLOW (A) 03/22/2021 1527   APPEARANCEUR HAZY (A) 03/22/2021 1527   LABSPEC 1.027 03/22/2021 1527   PHURINE 6.0 03/22/2021 1527   GLUCOSEU NEGATIVE 03/22/2021 1527   HGBUR MODERATE (A) 03/22/2021 1527   BILIRUBINUR NEGATIVE 03/22/2021 1527   BILIRUBINUR Trace 08/29/2016 1445   KETONESUR 80 (A) 03/22/2021 1527   PROTEINUR 30 (A) 03/22/2021 1527   UROBILINOGEN 0.2 08/29/2016 1445   UROBILINOGEN 0.2 08/06/2014 1413   NITRITE NEGATIVE 03/22/2021 1527   LEUKOCYTESUR SMALL (A) 03/22/2021 1527    Radiological Exams on  Admission: DG Chest 2 View  Result Date: 03/22/2021 CLINICAL DATA:  Suspected sepsis with fever and tachycardia in this 87 year old female. EXAM: CHEST - 2 VIEW COMPARISON:  August 29, 2016 FINDINGS: Cardiomediastinal contours are normal. Increased interstitial markings are present at the lung bases. Linear airspace disease at the LEFT lung base. Nodular density at the RIGHT upper chest asymmetric along the first rib and sternal junction no sign of pleural effusion. Signs of hyperinflation. Signs of kyphoplasty at the L1 level. Query interval loss of height also at T12. Appearance is similar to limited fluoroscopic views from November 08, 2020. Osteopenia. IMPRESSION: 1. Increased interstitial markings at the lung bases, may be chronic and related to interstitial changes, less likely mild edema. 2. Linear airspace disease at the LEFT lung base, likely atelectasis. 3. Nodular density at the RIGHT upper chest. This may represent confluence of shadows or developing infection, nodule in this area not excluded given asymmetry. Consider follow-up PA and lateral chest after resolution of symptoms in short interval to ensure resolution. 4. Question of interval loss of height at T12 since the end of 2021. Correlate with any pain in this area. Signs of prior L1 kyphoplasty. Electronically Signed   By: Zetta Bills M.D.   On: 03/22/2021 13:35     Assessment/Plan Principal Problem:   Sepsis ([place]) Active Problems:   Hypothyroidism   CAP (community acquired pneumonia)   Atrial  fibrillation with RVR (HCC)    Sepsis from community-acquired pneumonia (POA)  As evidenced by fever, hypotension (86/69) that responded to IV fluid resuscitation, tachycardia and evolving infiltrate in the right upper lobe. Patient's blood pressure has improved following IV fluid resuscitation Continue LR at 125 cc an hour Start patient empirically on Rocephin and Zithromax Follow-up results of blood cultures Patient therapy consult  for swallow function evaluation.    Atrial fibrillation with rapid ventricular rate (New onset) Patient has a CHA2DS2-VASc score of 3 and ideally requires long-term anticoagulation as a prophylaxis for acute stroke We will place patient on therapeutic Lovenox Obtain 2D echocardiogram to assess LVEF without valvular pathology Heart rate has improved with IV fluid hydration Obtain TSH levels     Hypothyroidism Continue Synthroid  DVT prophylaxis: Lovenox Code Status: full code Family Communication: Greater than 50% of time was spent discussing plan of care with patient at the bedside.  All questions and concerns have been addressed.  She verbalizes understanding and agrees with the plan. Also discussed the plan of care with patient's daughter, Mat Carne Disposition Plan: Back to previous home environment Consults called: none Status: At the time of admission, it appears that the appropriate admission status for this patient is inpatient. This is judged to be reasonable and necessary in order to provide the required intensity of service to ensure the patient's safety given the presenting symptoms, physical exam findings and initial radiographic and laboratory data in the context of their comorbid conditions. Patient requires inpatient status due to high intensity of service, high risk of further deterioration and high frequency of surveillance required.    Collier Bullock MD [facility]     03/22/2021, 5:12 PM

## 2021-03-22 NOTE — Telephone Encounter (Signed)
Aware, will watch for correspondence  

## 2021-03-22 NOTE — ED Triage Notes (Addendum)
Reports fever at night for last 3 nights of 101F. Cough, congestion. At home COVID test negative. Reports HR of 140 at home. Pt alert and oriented X4, cooperative, RR even and unlabored, color WNL. Pt in NAD. Negative at home COVID test today

## 2021-03-22 NOTE — Telephone Encounter (Signed)
I spoke with Mickel Baas (DPR signed) and pt is presently on her way to ED now. Sending note to DR [employer].

## 2021-03-22 NOTE — Consult Note (Signed)
CODE SEPSIS - PHARMACY COMMUNICATION  **Broad Spectrum Antibiotics should be administered within 1 hour of Sepsis diagnosis**  Time Code Sepsis Called/Page Received: 1308  Antibiotics Ordered: cefepime and vancomycin  Time of 1st antibiotic administration: 13:23      Oswald Hillock ,PharmD Clinical Pharmacist  03/22/2021  2:15 PM

## 2021-03-22 NOTE — Consult Note (Signed)
PHARMACY -  BRIEF ANTIBIOTIC NOTE   Pharmacy has received consult(s) for sepsis from an ED provider.  The patient's profile has been reviewed for ht/wt/allergies/indication/available labs.    One time order(s) placed for cefepime and vancomycin   Further antibiotics/pharmacy consults should be ordered by admitting physician if indicated.                       Thank you, Oswald Hillock 03/22/2021  1:17 PM

## 2021-03-22 NOTE — ED Notes (Signed)
Per Levada Dy in lab, will add on APTT to previously sent lab work

## 2021-03-23 ENCOUNTER — Telehealth: Payer: Medicare Other | Admitting: Family Medicine

## 2021-03-23 ENCOUNTER — Inpatient Hospital Stay
Admit: 2021-03-23 | Discharge: 2021-03-23 | Disposition: A | Discharge status: Home / Self Care | Payer: Medicare Other | Attending: Internal Medicine | Admitting: Internal Medicine

## 2021-03-23 ENCOUNTER — Encounter: Payer: Self-pay | Admitting: Internal Medicine

## 2021-03-23 LAB — ECHOCARDIOGRAM COMPLETE
AR max vel: 2.4 cm2
AV Area VTI: 2.71 cm2
AV Area mean vel: 2.22 cm2
AV Mean grad: 4 mmHg
AV Peak grad: 6.1 mmHg
Ao pk vel: 1.23 m/s
Area-P 1/2: 3.17 cm2
Height: 64 in
MV VTI: 1.51 cm2
S' Lateral: 2.5 cm
Weight: 2446.4 oz

## 2021-03-23 LAB — BASIC METABOLIC PANEL
Anion gap: 8 (ref 5–15)
BUN: 16 mg/dL (ref 8–23)
CO2: 22 mmol/L (ref 22–32)
Calcium: 8 mg/dL — ABNORMAL LOW (ref 8.9–10.3)
Chloride: 105 mmol/L (ref 98–111)
Creatinine, Ser: 0.56 mg/dL (ref 0.44–1.00)
GFR, Estimated: >60 mL/min (ref >60)
Glucose, Bld: 106 mg/dL — ABNORMAL HIGH (ref 70–99)
Potassium: 3.4 mmol/L — ABNORMAL LOW (ref 3.5–5.1)
Sodium: 135 mmol/L (ref 135–145)

## 2021-03-23 LAB — CBC
HCT: 31.6 % — ABNORMAL LOW (ref 36.0–46.0)
Hemoglobin: 10.5 g/dL — ABNORMAL LOW (ref 12.0–15.0)
MCH: 31.4 pg (ref 26.0–34.0)
MCHC: 33.2 g/dL (ref 30.0–36.0)
MCV: 94.6 fL (ref 80.0–100.0)
Platelets: 182 10*3/uL (ref 150–400)
RBC: 3.34 MIL/uL — ABNORMAL LOW (ref 3.87–5.11)
RDW: 12.6 % (ref 11.5–15.5)
WBC: 7.6 10*3/uL (ref 4.0–10.5)
nRBC: 0 % (ref 0.0–0.2)

## 2021-03-23 LAB — PROTIME-INR
INR: 1.1 (ref 0.8–1.2)
Prothrombin Time: 14.2 seconds (ref 11.4–15.2)

## 2021-03-23 LAB — PROCALCITONIN: Procalcitonin: 0.12 ng/mL

## 2021-03-23 LAB — CORTISOL-AM, BLOOD: Cortisol - AM: 18.7 ug/dL (ref 6.7–22.6)

## 2021-03-23 LAB — GLUCOSE, CAPILLARY
Glucose-Capillary: 109 mg/dL — ABNORMAL HIGH (ref 70–99)
Glucose-Capillary: 113 mg/dL — ABNORMAL HIGH (ref 70–99)

## 2021-03-23 MED ORDER — SODIUM CHLORIDE 0.9% FLUSH
10.0000 mL | Freq: Two times a day (BID) | INTRAVENOUS | Status: DC
  Administered 2021-03-23 – 2021-03-25 (×4): 10 mL via INTRAVENOUS

## 2021-03-23 MED ORDER — POTASSIUM CHLORIDE CRYS ER 20 MEQ PO TBCR
40.0000 meq | EXTENDED_RELEASE_TABLET | Freq: Once | ORAL | Status: AC
  Administered 2021-03-23: 40 meq via ORAL
  Filled 2021-03-23: qty 2

## 2021-03-23 MED ORDER — METOPROLOL TARTRATE 5 MG/5ML IV SOLN
5.0000 mg | INTRAVENOUS | Status: DC | PRN
  Administered 2021-03-23: 5 mg via INTRAVENOUS
  Filled 2021-03-23: qty 5

## 2021-03-23 MED ORDER — SODIUM CHLORIDE 0.9 % IV BOLUS
500.0000 mL | Freq: Once | INTRAVENOUS | Status: AC
  Administered 2021-03-23: 500 mL via INTRAVENOUS

## 2021-03-23 MED ORDER — RISAQUAD PO CAPS
2.0000 | ORAL_CAPSULE | Freq: Three times a day (TID) | ORAL | Status: DC
  Administered 2021-03-23 – 2021-03-25 (×7): 2 via ORAL
  Filled 2021-03-23 (×7): qty 2

## 2021-03-23 NOTE — Evaluation (Signed)
Clinical/Bedside Swallow Evaluation Patient Details  Name: Carla Kelly MRN: [id number] Date of Birth: 1932/12/18  Today's Date: 03/23/2021 Time: SLP Start Time (ACUTE ONLY): 2831 SLP Stop Time (ACUTE ONLY): 1445 SLP Time Calculation (min) (ACUTE ONLY): 60 min  Past Medical History:  Past Medical History:  Diagnosis Date  . Adenomatous colon polyp   . Anemia   . Asthma   . Depression   . Disorder of bone and cartilage, unspecified   . Embolism and thrombosis of unspecified site   . Fibromyalgia   . History of herpes zoster   . History of hiatal hernia   . HLD (hyperlipidemia)   . Hypoglycemia   . Hypothyroidism   . IBS (irritable bowel syndrome)   . Mitral valve disorder    "leaking mitral valve-very mild"  . Orbital floor fracture ([place]) 10/16   with a fall   . Osteoarthritis   . Osteoarthrosis, unspecified whether generalized or localized, unspecified site   . Other B-complex deficiencies   . Psoriasis   . Stroke ([place])   . Swelling of extremity, right    right leg  . 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 Unspecified urinary incontinence   . Unspecified vitamin D deficiency    Past Surgical History:  Past Surgical History:  Procedure Laterality Date  . ABDOMINAL HYSTERECTOMY  1970   fibroids; endometriosis  . APPENDECTOMY    . bladder tack  5/05  . BREAST BIOPSY Right 1995  . BREAST CYST ASPIRATION Left 03/17/2015  . CATARACT EXTRACTION Right 12/13   Dr George Ina  . CATARACT EXTRACTION W/PHACO Left 05/10/2015   Procedure: CATARACT EXTRACTION PHACO AND INTRAOCULAR LENS PLACEMENT (IOC);  Surgeon: Birder Robson, MD;  Location: [facility] ORS;  Service: Ophthalmology;  Laterality: Left;  [place] 00:59 AP% 26.7 CDE 15.85 fluid pack lot#[id number]  . CERVICAL DISC SURGERY     x 2  . CHOLECYSTECTOMY  2004  . COLONOSCOPY  2/02   polyps, BE-polyps  . EYE SURGERY     right eye  . JOINT REPLACEMENT    . KYPHOPLASTY N/A 11/08/2020   Procedure: L1 Kyphoplasty;  Surgeon: Hessie Knows, MD;  Location: [facility] ORS;  Service: Orthopedics;  Laterality: N/A;  . MOUTH SURGERY     lower teeth have 4 studs to keep in place  . TONSILLECTOMY    . TOTAL KNEE ARTHROPLASTY Right 08/13/2014   Procedure: RIGHT TOTAL KNEE ARTHROPLASTY;  Surgeon: Mcarthur Rossetti, MD;  Location: [facility];  Service: Orthopedics;  Laterality: Right;  . TYMPANOPLASTY    . US TRANSVAGINAL PELVIC MODIFIED  10/02   small left ovarian cyst   HPI:  Pt is a 87 y.o. female with medical history significant for depression, asthma, dyslipidemia, hypothyroidism, Fibromyalgia, CVA, hiatal hernia, and other medical dxs who presents to the ER via EMS for evaluation of a fever.  Patient states that for the last 3 nights she has had a fever with a T-max of 102 F.  Fever has been associated with a cough productive of greenish-yellow phlegm and congestion.  She had taken multiple home COVID test which were negative.  Patient was noted to be tachycardic upon arrival with heart rate of 140 bpm.  She has had a sick contact and states that her son-in-law has had an upper respiratory tract infection.  CXR: Increased interstitial markings at the lung bases, may be chronic and related to interstitial changes, less likely mild edema.  Linear airspace disease at the LEFT lung base, likely atelectasis.  Nodular density at the RIGHT upper chest. This may represent confluence of shadows or developing infection, nodule in this area not excluded given asymmetry. Consider follow-up PA and lateral  chest after resolution of symptoms in short interval to ensure resolution.  Pt has a Hiatal Hernia per chart/report.  Family and pt endorse s/s of Reflux at home reporting intermittently difficulty clearing certain solid foods(pointing to mid-sternum area of chest). Pt is not on a PPI.   Assessment / Plan / Recommendation Clinical Impression  Pt appears to present w/ adequate oropharyngeal phase swallowing function w/ No overt oropharyngeal phase  dysphagia appreciated w/ trials(thin liquids); No neuromuscular swallowing deficits appreciated. Pt is at reduced risk for aspiration from an oropharyngeal phase standpoint following general aspiration precautions. Pt has a baseline of REFLUX s/s per report/description from family member, and dx'd Hiatal Hernia. Pt herself c/o Dysmotility of the Esophagus when eating certain foods -- solids.  ANY Dysmotility or Regurgitation of Reflux material can increase risk for aspiration of the Reflux material during Retrograde flow thus impact Pulmonary status. Pt described ongoing issues w/ "stuck feeling in my chest" pointing to mid-lower sternum area w/ certain foods she ate at home. She has learned to "relax" to let the feeling pass. She also has dx'd Hiatal Hernia which can impact Esophageal motility. Pt sat more upright in bed w/ pillows to support and consumed trials of thin liquids via Straw w/ No overt clinical s/s of aspiration noted; clear vocal quality b/t trials, no decline in pulmonary status, no multiple swallows noted post initial pharyngeal swallow. Oral phase appeared [facility] for bolus management and timely A-P transfer/clearing of material. OM exam was [facility] for oral clearing; lingual/labial movements. No unilateral weakness. Speech clear.   Recommend continue Regluar diet monitoring meats/breads in diet, moisten foods well; thin liquids; general aspiration precautions. Rest Breaks during meals/oral intake to allow for Esophageal clearing. REFLUX precautions strongly recommended to lessen chance for Regurgitation. Recommend pt f/u w/ GI for management of potential Reflux and tx as s/s indicate. Thorough disucssion w/ pt/family and handouts given on Reflux. MD to reconsult ST services if any new needs while admitted. SLP Visit Diagnosis: Dysphagia, unspecified (R13.10)    Aspiration Risk   (reduced following general aspiration and reflux precautions)    Diet Recommendation  Regular diet w/ more mech soft  meats/cut foods moistened well in the diet; Thin liquids. General aspiration precautions. Follow REFLUX precautions.   Medication Administration: Whole meds with liquid (or whole in Puree if desired)    Other  Recommendations Recommended Consults: Consider GI evaluation;Consider esophageal assessment Oral Care Recommendations: Oral care BID;Oral care before and after PO;Patient independent with oral care Other Recommendations:  (n/a)   Follow up Recommendations None      Frequency and Duration  (n/a)   (n/a)       Prognosis Prognosis for Safe Diet Advancement: Good Barriers to Reach Goals:  (n/a)      Swallow Study   General Date of Onset: 03/22/21 HPI: Pt is a 87 y.o. female with medical history significant for depression, asthma, dyslipidemia, hypothyroidism, Fibromyalgia, CVA, hiatal hernia, and other medical dxs who presents to the ER via EMS for evaluation of a fever.  Patient states that for the last 3 nights she has had a fever with a T-max of 102 F.  Fever has been associated with a cough productive of greenish-yellow phlegm and congestion.  She had taken multiple home COVID test which were negative.  Patient was noted to  be tachycardic upon arrival with heart rate of 140 bpm.  She has had a sick contact and states that her son-in-law has had an upper respiratory tract infection.  CXR: Increased interstitial markings at the lung bases, may be chronic and related to interstitial changes, less likely mild edema.  Linear airspace disease at the LEFT lung base, likely atelectasis.  Nodular density at the RIGHT upper chest. This may represent confluence of shadows or developing infection, nodule in this area not excluded given asymmetry. Consider follow-up PA and lateral  chest after resolution of symptoms in short interval to ensure resolution.  Pt has a Hiatal Hernia per chart/report.  Family and pt endorse s/s of Reflux at home reporting intermittently difficulty clearing certain solid  foods(pointing to mid-sternum area of chest). Pt is not on a PPI. Type of Study: Bedside Swallow Evaluation Previous Swallow Assessment: none Diet Prior to this Study: Regular;Thin liquids Temperature Spikes Noted: No (wbc 7.6) Respiratory Status: Room air (weaning from 2L Keys today) History of Recent Intubation: No Behavior/Cognition: Alert;Cooperative;Pleasant mood Oral Cavity Assessment: Within Functional Limits Oral Care Completed by SLP: Recent completion by staff Oral Cavity - Dentition: Dentures, top;Dentures, bottom (secure) Vision: Functional for self-feeding Self-Feeding Abilities: Able to feed self Patient Positioning: Upright in bed (needed min support/education on sitting fully upright) Baseline Vocal Quality: Normal Volitional Cough: Strong Volitional Swallow: Able to elicit    Oral/Motor/Sensory Function Overall Oral Motor/Sensory Function: Within functional limits   Ice Chips Ice chips: Not tested   Thin Liquid Thin Liquid: Within functional limits Presentation: Self Fed;Straw (multiple sips - 7-8+; liquid medicine w/ NSG)    Nectar Thick Nectar Thick Liquid: Not tested   Honey Thick Honey Thick Liquid: Not tested   Puree Puree: Not tested   Solid     Solid: Not tested        Orinda Kenner, MS, CCC-SLP Speech Language Pathologist Rehab Services [phone] Dereonna Lensing 03/23/2021,3:43 PM

## 2021-03-23 NOTE — Progress Notes (Signed)
*  PRELIMINARY RESULTS* Echocardiogram 2D Echocardiogram3 has been performed.  Sherrie Sport 03/23/2021, 9:46 AM

## 2021-03-23 NOTE — Progress Notes (Addendum)
Progress Note    Carla Kelly  JIR:[id number] DOB: 1932-12-09  DOA: 03/22/2021 PCP: Abner Greenspan, MD      Brief Narrative:    Medical records reviewed and are as summarized below:  Carla Kelly is a 87 y.o. female       Assessment/Plan:   Principal Problem:   Sepsis ([place]) Active Problems:   Hypothyroidism   CAP (community acquired pneumonia)   Atrial fibrillation with RVR ([place])   Body mass index is 26.25 kg/m.    Sepsis secondary to pneumonia: Continue empiric IV antibiotics.  Follow-up blood cultures.  Atrial fibrillation with RVR: Converted to normal sinus rhythm.  Patient said she has no history of A. fib.  She also denied any history of stroke given daughters.  Commented on the past medical history in the chart.  This was confirmed by her daughter. 2D echo showed EF estimated at 60 to 65%, normal LV diastolic parameters, mild to moderate mitral regurgitation, moderate tricuspid regurgitation.  Hypokalemia: Replete potassium and monitor levels.  Hypotension: Resolved.  Discontinue IV fluids  History of asthma, osteoarthritis.  She has ?moles on her skin.  There is one mole on her chest that she has had since childhood but she said she had developed new moles on her abdomen.  Recommended outpatient follow-up with the dermatologist.  She says she already has a dermatologist but she has not seen the dermatologist in a while.  Diet Order            Diet 2 gram sodium Room service appropriate? Yes; Fluid consistency: Thin  Diet effective now                    Consultants:  None  Procedures:  None    Medications:   . acidophilus  2 capsule Oral TID with meals  . vitamin C  2,000 mg Oral QPM  . aspirin EC  325 mg Oral Daily  . cholecalciferol   Oral Daily  . enoxaparin (LOVENOX) injection  70 mg Subcutaneous Q12H  . levothyroxine  88 mcg Oral Q0600   Continuous Infusions: . azithromycin 500 mg (03/22/21 2105)  . cefTRIAXone  (ROCEPHIN)  IV 2 g (03/22/21 1748)     Anti-infectives (From admission, onward)   Start     Dose/Rate Route Frequency Ordered Stop   03/22/21 1700  cefTRIAXone (ROCEPHIN) 2 g in sodium chloride 0.9 % 100 mL IVPB        2 g 200 mL/hr over 30 Minutes Intravenous Every 24 hours 03/22/21 1648     03/22/21 1700  azithromycin (ZITHROMAX) 500 mg in sodium chloride 0.9 % 250 mL IVPB        500 mg 250 mL/hr over 60 Minutes Intravenous Every 24 hours 03/22/21 1648     03/22/21 1315  ceFEPIme (MAXIPIME) 2 g in sodium chloride 0.9 % 100 mL IVPB        2 g 200 mL/hr over 30 Minutes Intravenous  Once 03/22/21 1306 03/22/21 1409   03/22/21 1315  metroNIDAZOLE (FLAGYL) IVPB 500 mg        500 mg 100 mL/hr over 60 Minutes Intravenous  Once 03/22/21 1306 03/22/21 1454   03/22/21 1315  vancomycin (VANCOCIN) IVPB 1000 mg/200 mL premix        1,000 mg 200 mL/hr over 60 Minutes Intravenous  Once 03/22/21 1306 03/22/21 1614             Family Communication/Anticipated D/C date  and plan/Code Status   DVT prophylaxis:      Code Status: Full Code  Family Communication: Mickel Baas, daughter, at the bedside Disposition Plan:    Status is: Inpatient  Remains inpatient appropriate because:IV treatments appropriate due to intensity of illness or inability to take PO and Inpatient level of care appropriate due to severity of illness   Dispo: The patient is from: Home              Anticipated d/c is to: Home              Patient currently is not medically stable to d/c.   Difficult to place patient No           Subjective:   C/o productive cough.  Breathing is a little better today. Her daughter was at the bedside.  Objective:    Vitals:   03/23/21 0422 03/23/21 0800 03/23/21 1205 03/23/21 1618  BP: (!) 164/78 140/67 (!) 143/70 (!) 169/89  Pulse: (!) 107 81 82 93  Resp: 18 17 16 16   Temp: 98.7 F (37.1 C) 98.5 F (36.9 C) 97.8 F (36.6 C) 98.4 F (36.9 C)  TempSrc: Oral Oral  Oral Oral  SpO2: 93% 96% 98% 93%  Weight: 69.4 kg     Height:       No data found.   Intake/Output Summary (Last 24 hours) at 03/23/2021 1621 Last data filed at 03/23/2021 1500 Gross per 24 hour  Intake 3274.22 ml  Output 2050 ml  Net 1224.22 ml   Filed Weights   03/22/21 1229 03/22/21 1825 03/23/21 0422  Weight: 67.1 kg 69.6 kg 69.4 kg    Exam:  GEN: NAD SKIN: No rash. Moles on the abdomen EYES: EOMI ENT: MMM CV: RRR PULM: No wheezing heard.  Rales heard in the mid zone of the right lung ABD: soft, ND, NT, +BS CNS: AAO x 3, non focal EXT: No edema or tenderness        Data Reviewed:   I have personally reviewed following labs and imaging studies:  Labs: Labs show the following:   Basic Metabolic Panel: Recent Labs  Lab 03/22/21 1233 03/23/21 0515  NA 138 135  K 4.6 3.4*  CL 107 105  CO2 22 22  GLUCOSE 117* 106*  BUN 17 16  CREATININE 0.70 0.56  CALCIUM 8.8* 8.0*   GFR Estimated Creatinine Clearance: 48.3 mL/min (by C-G formula based on SCr of 0.56 mg/dL). Liver Function Tests: Recent Labs  Lab 03/22/21 1233  AST 30  ALT 19  ALKPHOS 73  BILITOT 1.4*  PROT 7.1  ALBUMIN 3.4*   No results for input(s): LIPASE, AMYLASE in the last 168 hours. No results for input(s): AMMONIA in the last 168 hours. Coagulation profile Recent Labs  Lab 03/22/21 1233 03/23/21 0515  INR 1.1 1.1    CBC: Recent Labs  Lab 03/22/21 1233 03/23/21 0515  WBC 7.0 7.6  NEUTROABS 4.8  --   HGB 11.9* 10.5*  HCT 35.6* 31.6*  MCV 93.9 94.6  PLT 189 182   Cardiac Enzymes: No results for input(s): CKTOTAL, CKMB, CKMBINDEX, TROPONINI in the last 168 hours. BNP (last 3 results) No results for input(s): PROBNP in the last 8760 hours. CBG: Recent Labs  Lab 03/23/21 0800 03/23/21 1206  GLUCAP 109* 113*   D-Dimer: No results for input(s): DDIMER in the last 72 hours. Hgb A1c: No results for input(s): HGBA1C in the last 72 hours. Lipid Profile: No results  for input(s): CHOL,  HDL, LDLCALC, TRIG, CHOLHDL, LDLDIRECT in the last 72 hours. Thyroid function studies: Recent Labs    03/22/21 1658  TSH 0.452   Anemia work up: No results for input(s): VITAMINB12, FOLATE, FERRITIN, TIBC, IRON, RETICCTPCT in the last 72 hours. Sepsis Labs: Recent Labs  Lab 03/22/21 1233 03/22/21 1527 03/23/21 0515  PROCALCITON  --   --  0.12  WBC 7.0  --  7.6  LATICACIDVEN 1.2 1.1  --     Microbiology Recent Results (from the past 240 hour(s))  Resp Panel by RT-PCR (Flu A&B, Covid) Nasopharyngeal Swab     Status: None   Collection Time: 03/22/21  1:05 PM   Specimen: Nasopharyngeal Swab; Nasopharyngeal(NP) swabs in vial transport medium  Result Value Ref Range Status   SARS Coronavirus 2 by RT PCR NEGATIVE NEGATIVE Final    Comment: (NOTE) SARS-CoV-2 target nucleic acids are NOT DETECTED.  The SARS-CoV-2 RNA is generally detectable in upper respiratory specimens during the acute phase of infection. The lowest concentration of SARS-CoV-2 viral copies this assay can detect is 138 copies/mL. A negative result does not preclude SARS-Cov-2 infection and should not be used as the sole basis for treatment or other patient management decisions. A negative result may occur with  improper specimen collection/handling, submission of specimen other than nasopharyngeal swab, presence of viral mutation(s) within the areas targeted by this assay, and inadequate number of viral copies(<138 copies/mL). A negative result must be combined with clinical observations, patient history, and epidemiological information. The expected result is Negative.  Fact Sheet for Patients:  EntrepreneurPulse.com.au  Fact Sheet for Healthcare Providers:  IncredibleEmployment.be  This test is no t yet approved or cleared by the [place] FDA and  has been authorized for detection and/or diagnosis of SARS-CoV-2 by FDA under an Emergency Use  Authorization (EUA). This EUA will remain  in effect (meaning this test can be used) for the duration of the COVID-19 declaration under Section 564(b)(1) of the Act, 21 U.S.C.section 360bbb-3(b)(1), unless the authorization is terminated  or revoked sooner.       Influenza A by PCR NEGATIVE NEGATIVE Final   Influenza B by PCR NEGATIVE NEGATIVE Final    Comment: (NOTE) The Xpert Xpress SARS-CoV-2/FLU/RSV plus assay is intended as an aid in the diagnosis of influenza from Nasopharyngeal swab specimens and should not be used as a sole basis for treatment. Nasal washings and aspirates are unacceptable for Xpert Xpress SARS-CoV-2/FLU/RSV testing.  Fact Sheet for Patients: EntrepreneurPulse.com.au  Fact Sheet for Healthcare Providers: IncredibleEmployment.be  This test is not yet approved or cleared by the [place] FDA and has been authorized for detection and/or diagnosis of SARS-CoV-2 by FDA under an Emergency Use Authorization (EUA). This EUA will remain in effect (meaning this test can be used) for the duration of the COVID-19 declaration under Section 564(b)(1) of the Act, 21 U.S.C. section 360bbb-3(b)(1), unless the authorization is terminated or revoked.  Performed at [facility], [address] [place], [place] [address]     Procedures and diagnostic studies:  DG Chest 2 View  Result Date: 03/22/2021 CLINICAL DATA:  Suspected sepsis with fever and tachycardia in this 87 year old female. EXAM: CHEST - 2 VIEW COMPARISON:  August 29, 2016 FINDINGS: Cardiomediastinal contours are normal. Increased interstitial markings are present at the lung bases. Linear airspace disease at the LEFT lung base. Nodular density at the RIGHT upper chest asymmetric along the first rib and sternal junction no sign of pleural effusion. Signs of hyperinflation. Signs of  kyphoplasty at the L1 level. Query interval loss of height also at T12.  Appearance is similar to limited fluoroscopic views from November 08, 2020. Osteopenia. IMPRESSION: 1. Increased interstitial markings at the lung bases, may be chronic and related to interstitial changes, less likely mild edema. 2. Linear airspace disease at the LEFT lung base, likely atelectasis. 3. Nodular density at the RIGHT upper chest. This may represent confluence of shadows or developing infection, nodule in this area not excluded given asymmetry. Consider follow-up PA and lateral chest after resolution of symptoms in short interval to ensure resolution. 4. Question of interval loss of height at T12 since the end of 2021. Correlate with any pain in this area. Signs of prior L1 kyphoplasty. Electronically Signed   By: Zetta Bills M.D.   On: 03/22/2021 13:35   ECHOCARDIOGRAM COMPLETE  Result Date: 03/23/2021    ECHOCARDIOGRAM REPORT   [name] Date of Exam: 03/23/2021 Medical Rec #:  [id number]    Height:       64.0 in Accession #:    [id number]   Weight:       152.9 lb Date of Birth:  July 30, 1933    BSA:          1.745 m Patient Age:    10 years     BP:           140/67 mmHg Patient Gender: F            HR:           81 bpm. Exam Location:  [facility] Procedure: 2D Echo, Cardiac Doppler, Color Doppler and Strain Analysis Indications:     Atrial Fibrillation I48.91  History:         Patient has no prior history of Echocardiogram examinations.                  Stroke. Mitral valve disorder.  Sonographer:     Sherrie Sport RDCS (AE) Referring Phys:  [id number] Collier Bullock Diagnosing Phys: Isaias Cowman MD  Sonographer Comments: Global longitudinal strain was attempted. IMPRESSIONS  1. Left ventricular ejection fraction, by estimation, is 60 to 65%. The left ventricle has normal function. The left ventricle has no regional wall motion abnormalities. Left ventricular diastolic parameters were normal.  2. Right ventricular systolic function is normal. The right ventricular size is normal.  3.  The mitral valve is normal in structure. Mild to moderate mitral valve regurgitation. Mild mitral stenosis.  4. Tricuspid valve regurgitation is moderate.  5. The aortic valve is normal in structure. Aortic valve regurgitation is not visualized. No aortic stenosis is present.  6. The inferior vena cava is normal in size with greater than 50% respiratory variability, suggesting right atrial pressure of 3 mmHg. FINDINGS  Left Ventricle: Left ventricular ejection fraction, by estimation, is 60 to 65%. The left ventricle has normal function. The left ventricle has no regional wall motion abnormalities. The left ventricular internal cavity size was normal in size. There is  no left ventricular hypertrophy. Left ventricular diastolic parameters were normal. Right Ventricle: The right ventricular size is normal. No increase in right ventricular wall thickness. Right ventricular systolic function is normal. Left Atrium: Left atrial size was normal in size. Right Atrium: Right atrial size was normal in size. Pericardium: There is no evidence of pericardial effusion. Mitral Valve: The mitral valve is normal in structure. Mild to moderate mitral valve regurgitation. Mild mitral valve stenosis. MV peak gradient, 11.8 mmHg.  The mean mitral valve gradient is 5.0 mmHg. Tricuspid Valve: The tricuspid valve is normal in structure. Tricuspid valve regurgitation is moderate . No evidence of tricuspid stenosis. Aortic Valve: The aortic valve is normal in structure. Aortic valve regurgitation is not visualized. No aortic stenosis is present. Aortic valve mean gradient measures 4.0 mmHg. Aortic valve peak gradient measures 6.1 mmHg. Aortic valve area, by VTI measures 2.71 cm. Pulmonic Valve: The pulmonic valve was normal in structure. Pulmonic valve regurgitation is not visualized. No evidence of pulmonic stenosis. Aorta: The aortic root is normal in size and structure. Venous: The inferior vena cava is normal in size with greater than  50% respiratory variability, suggesting right atrial pressure of 3 mmHg. IAS/Shunts: No atrial level shunt detected by color flow Doppler.  LEFT VENTRICLE PLAX 2D LVIDd:         4.28 cm  Diastology LVIDs:         2.50 cm  LV e' medial:    7.07 cm/s LV PW:         0.99 cm  LV E/e' medial:  18.7 LV IVS:        0.71 cm  LV e' lateral:   5.44 cm/s LVOT diam:     2.00 cm  LV E/e' lateral: 24.3 LV SV:         69 LV SV Index:   39 LVOT Area:     3.14 cm                          3D Volume EF:                         3D EF:        56 %                         LV EDV:       95 ml                         LV ESV:       42 ml                         LV SV:        53 ml RIGHT VENTRICLE RV Basal diam:  3.22 cm RV S prime:     15.60 cm/s TAPSE (M-mode): 3.4 cm LEFT ATRIUM             Index       RIGHT ATRIUM           Index LA diam:        3.30 cm 1.89 cm/m  RA Area:     16.50 cm LA Vol (A2C):   74.0 ml 42.40 ml/m RA Volume:   48.60 ml  27.85 ml/m LA Vol (A4C):   38.3 ml 21.94 ml/m LA Biplane Vol: 54.2 ml 31.05 ml/m  AORTIC VALVE                   PULMONIC VALVE AV Area (Vmax):    2.40 cm    PV Vmax:        0.66 m/s AV Area (Vmean):   2.22 cm    PV Peak grad:   1.7 mmHg AV Area (VTI):     2.71 cm    RVOT Peak grad:  1 mmHg AV Vmax:           123.00 cm/s AV Vmean:          92.250 cm/s AV VTI:            0.254 m AV Peak Grad:      6.1 mmHg AV Mean Grad:      4.0 mmHg LVOT Vmax:         94.00 cm/s LVOT Vmean:        65.300 cm/s LVOT VTI:          0.219 m LVOT/AV VTI ratio: 0.86  AORTA Ao Root diam: 2.90 cm MITRAL VALVE                TRICUSPID VALVE MV Area (PHT): 3.17 cm     TR Peak grad:   14.6 mmHg MV Area VTI:   1.51 cm     TR Vmax:        191.00 cm/s MV Peak grad:  11.8 mmHg MV Mean grad:  5.0 mmHg     SHUNTS MV Vmax:       1.72 m/s     Systemic VTI:  0.22 m MV Vmean:      102.0 cm/s   Systemic Diam: 2.00 cm MV Decel Time: 239 msec MV E velocity: 132.00 cm/s MV A velocity: 125.00 cm/s MV E/A ratio:  1.06 Isaias Cowman MD Electronically signed by Isaias Cowman MD Signature Date/Time: 03/23/2021/1:50:17 PM    Final                LOS: 1 day   Altin Sease  [facility]   Pager on [email or web address]. If 7PM-7AM, please contact night-coverage at [email or web address]     03/23/2021, 4:21 PM

## 2021-03-24 LAB — URINE CULTURE: Culture: NO GROWTH

## 2021-03-24 LAB — POTASSIUM: Potassium: 3.6 mmol/L (ref 3.5–5.1)

## 2021-03-24 LAB — MAGNESIUM: Magnesium: 1.9 mg/dL (ref 1.7–2.4)

## 2021-03-24 MED ORDER — POTASSIUM CHLORIDE CRYS ER 20 MEQ PO TBCR
40.0000 meq | EXTENDED_RELEASE_TABLET | Freq: Once | ORAL | Status: AC
  Administered 2021-03-24: 40 meq via ORAL
  Filled 2021-03-24: qty 2

## 2021-03-24 MED ORDER — MAGNESIUM SULFATE 2 GM/50ML IV SOLN
2.0000 g | Freq: Once | INTRAVENOUS | Status: AC
  Administered 2021-03-24: 2 g via INTRAVENOUS
  Filled 2021-03-24: qty 50

## 2021-03-24 MED ORDER — DILTIAZEM HCL ER COATED BEADS 120 MG PO CP24
120.0000 mg | ORAL_CAPSULE | Freq: Every day | ORAL | Status: DC
  Administered 2021-03-24 – 2021-03-25 (×2): 120 mg via ORAL
  Filled 2021-03-24 (×2): qty 1

## 2021-03-24 NOTE — Care Management Important Message (Signed)
Important Message  Patient Details  Name: TENISE STETLER MRN: [id number] Date of Birth: 1933-06-16   Medicare Important Message Given:  N/A - LOS <3 / Initial given by admissions  Initial Medicare IM reviewed with patient by S. Geanie Cooley, Patient Access Associate on 03/23/2021 at 10:30am.     Dannette Barbara 03/24/2021, 9:23 AM

## 2021-03-24 NOTE — Progress Notes (Signed)
Progress Note    Carla Kelly  IRJ:[id number] DOB: 1933-07-31  DOA: 03/22/2021 PCP: Abner Greenspan, MD      Brief Narrative:    Medical records reviewed and are as summarized below:  Carla Kelly is a 87 y.o. female       Assessment/Plan:   Principal Problem:   Sepsis ([place]) Active Problems:   Hypothyroidism   CAP (community acquired pneumonia)   Atrial fibrillation with RVR ([place])   Body mass index is 26.25 kg/m.    Sepsis secondary to pneumonia: Continue IV Rocephin and oral azithromycin.  No growth on blood cultures thus far.  Follow-up blood cultures.   Atrial fibrillation with RVR: She went into rapid A. fib overnight with heart rate in the 150s.  She converted back into normal sinus rhythm.  Start oral Cardizem for rate control and monitor heart rate.  2D echo showed EF estimated at 60 to 65%, normal LV diastolic parameters, mild to moderate mitral regurgitation, moderate tricuspid regurgitation.  Hypokalemia: Replete potassium.  Give IV magnesium sulfate to keep magnesium level at 2 or more.  Hypotension: Resolved.    History of asthma, osteoarthritis.  She has ?moles on her skin.  There is one mole on her chest that she has had since childhood but she said she had developed new moles on her abdomen.  Recommended outpatient follow-up with the dermatologist.  She says she already has a dermatologist but she has not seen the dermatologist in a while.  Encouraged ambulation today and monitor heart rate with ambulation.  Plan to discharge home tomorrow.  Diet Order            Diet 2 gram sodium Room service appropriate? Yes; Fluid consistency: Thin  Diet effective now                    Consultants:  None  Procedures:  None    Medications:   . acidophilus  2 capsule Oral TID with meals  . vitamin C  2,000 mg Oral QPM  . aspirin EC  325 mg Oral Daily  . cholecalciferol   Oral Daily  . diltiazem  120 mg Oral Daily  . enoxaparin  (LOVENOX) injection  70 mg Subcutaneous Q12H  . levothyroxine  88 mcg Oral Q0600  . potassium chloride  40 mEq Oral Once  . sodium chloride flush  10 mL Intravenous Q12H   Continuous Infusions: . azithromycin 500 mg (03/23/21 1910)  . cefTRIAXone (ROCEPHIN)  IV 2 g (03/24/21 1651)  . magnesium sulfate bolus IVPB       Anti-infectives (From admission, onward)   Start     Dose/Rate Route Frequency Ordered Stop   03/22/21 1700  cefTRIAXone (ROCEPHIN) 2 g in sodium chloride 0.9 % 100 mL IVPB        2 g 200 mL/hr over 30 Minutes Intravenous Every 24 hours 03/22/21 1648     03/22/21 1700  azithromycin (ZITHROMAX) 500 mg in sodium chloride 0.9 % 250 mL IVPB        500 mg 250 mL/hr over 60 Minutes Intravenous Every 24 hours 03/22/21 1648     03/22/21 1315  ceFEPIme (MAXIPIME) 2 g in sodium chloride 0.9 % 100 mL IVPB        2 g 200 mL/hr over 30 Minutes Intravenous  Once 03/22/21 1306 03/22/21 1409   03/22/21 1315  metroNIDAZOLE (FLAGYL) IVPB 500 mg        500 mg  100 mL/hr over 60 Minutes Intravenous  Once 03/22/21 1306 03/22/21 1454   03/22/21 1315  vancomycin (VANCOCIN) IVPB 1000 mg/200 mL premix        1,000 mg 200 mL/hr over 60 Minutes Intravenous  Once 03/22/21 1306 03/22/21 1614             Family Communication/Anticipated D/C date and plan/Code Status   DVT prophylaxis:      Code Status: Full Code  Family Communication: Mickel Baas, daughter, at the bedside Disposition Plan:    Status is: Inpatient  Remains inpatient appropriate because:IV treatments appropriate due to intensity of illness or inability to take PO and Inpatient level of care appropriate due to severity of illness   Dispo: The patient is from: Home              Anticipated d/c is to: Home              Patient currently is not medically stable to d/c.   Difficult to place patient No           Subjective:   C/o productive cough.  Breathing is a little better today. Her daughter was at the  bedside.  Objective:    Vitals:   03/24/21 0054 03/24/21 0253 03/24/21 0817 03/24/21 1205  BP: 102/77 104/79 138/70 (!) 117/51  Pulse: (!) 108 99 81 77  Resp: 19 17 18 18   Temp: 97.7 F (36.5 C) 99.1 F (37.3 C) 98.1 F (36.7 C)   TempSrc: Oral Oral Oral   SpO2: 97% 96% 97% 96%  Weight:      Height:       No data found.   Intake/Output Summary (Last 24 hours) at 03/24/2021 1711 Last data filed at 03/24/2021 1330 Gross per 24 hour  Intake 1230.24 ml  Output --  Net 1230.24 ml   Filed Weights   03/22/21 1229 03/22/21 1825 03/23/21 0422  Weight: 67.1 kg 69.6 kg 69.4 kg    Exam:  GEN: NAD SKIN: No rash EYES: EOMI ENT: MMM CV: RRR PULM: Rales heard in the mid zone of the right lung.  No wheezing. ABD: soft, ND, NT, +BS CNS: AAO x 3, non focal EXT: No edema or tenderness        Data Reviewed:   I have personally reviewed following labs and imaging studies:  Labs: Labs show the following:   Basic Metabolic Panel: Recent Labs  Lab 03/22/21 1233 03/23/21 0515 03/24/21 0434  NA 138 135  --   K 4.6 3.4* 3.6  CL 107 105  --   CO2 22 22  --   GLUCOSE 117* 106*  --   BUN 17 16  --   CREATININE 0.70 0.56  --   CALCIUM 8.8* 8.0*  --   MG  --   --  1.9   GFR Estimated Creatinine Clearance: 48.3 mL/min (by C-G formula based on SCr of 0.56 mg/dL). Liver Function Tests: Recent Labs  Lab 03/22/21 1233  AST 30  ALT 19  ALKPHOS 73  BILITOT 1.4*  PROT 7.1  ALBUMIN 3.4*   No results for input(s): LIPASE, AMYLASE in the last 168 hours. No results for input(s): AMMONIA in the last 168 hours. Coagulation profile Recent Labs  Lab 03/22/21 1233 03/23/21 0515  INR 1.1 1.1    CBC: Recent Labs  Lab 03/22/21 1233 03/23/21 0515  WBC 7.0 7.6  NEUTROABS 4.8  --   HGB 11.9* 10.5*  HCT 35.6* 31.6*  MCV 93.9  94.6  PLT 189 182   Cardiac Enzymes: No results for input(s): CKTOTAL, CKMB, CKMBINDEX, TROPONINI in the last 168 hours. BNP (last 3  results) No results for input(s): PROBNP in the last 8760 hours. CBG: Recent Labs  Lab 03/23/21 0800 03/23/21 1206  GLUCAP 109* 113*   D-Dimer: No results for input(s): DDIMER in the last 72 hours. Hgb A1c: No results for input(s): HGBA1C in the last 72 hours. Lipid Profile: No results for input(s): CHOL, HDL, LDLCALC, TRIG, CHOLHDL, LDLDIRECT in the last 72 hours. Thyroid function studies: Recent Labs    03/22/21 1658  TSH 0.452   Anemia work up: No results for input(s): VITAMINB12, FOLATE, FERRITIN, TIBC, IRON, RETICCTPCT in the last 72 hours. Sepsis Labs: Recent Labs  Lab 03/22/21 1233 03/22/21 1527 03/23/21 0515  PROCALCITON  --   --  0.12  WBC 7.0  --  7.6  LATICACIDVEN 1.2 1.1  --     Microbiology Recent Results (from the past 240 hour(s))  Culture, blood (Routine x 2)     Status: None (Preliminary result)   Collection Time: 03/22/21 12:33 PM   Specimen: BLOOD  Result Value Ref Range Status   Specimen Description BLOOD LEFT ANTECUBITAL  Final   Special Requests   Final    BOTTLES DRAWN AEROBIC AND ANAEROBIC Blood Culture results may not be optimal due to an excessive volume of blood received in culture bottles   Culture   Final    NO GROWTH 2 DAYS Performed at [facility], [address], [place], [place] [address]    Report Status PENDING  Incomplete  Culture, blood (Routine x 2)     Status: None (Preliminary result)   Collection Time: 03/22/21 12:40 PM   Specimen: BLOOD  Result Value Ref Range Status   Specimen Description BLOOD RIGHT ANTECUBITAL  Final   Special Requests   Final    BOTTLES DRAWN AEROBIC AND ANAEROBIC Blood Culture results may not be optimal due to an excessive volume of blood received in culture bottles   Culture   Final    NO GROWTH 2 DAYS Performed at [facility], [address], [place], [place] [address]    Report Status PENDING  Incomplete  Resp Panel by RT-PCR (Flu A&B, Covid) Nasopharyngeal Swab      Status: None   Collection Time: 03/22/21  1:05 PM   Specimen: Nasopharyngeal Swab; Nasopharyngeal(NP) swabs in vial transport medium  Result Value Ref Range Status   SARS Coronavirus 2 by RT PCR NEGATIVE NEGATIVE Final    Comment: (NOTE) SARS-CoV-2 target nucleic acids are NOT DETECTED.  The SARS-CoV-2 RNA is generally detectable in upper respiratory specimens during the acute phase of infection. The lowest concentration of SARS-CoV-2 viral copies this assay can detect is 138 copies/mL. A negative result does not preclude SARS-Cov-2 infection and should not be used as the sole basis for treatment or other patient management decisions. A negative result may occur with  improper specimen collection/handling, submission of specimen other than nasopharyngeal swab, presence of viral mutation(s) within the areas targeted by this assay, and inadequate number of viral copies(<138 copies/mL). A negative result must be combined with clinical observations, patient history, and epidemiological information. The expected result is Negative.  Fact Sheet for Patients:  EntrepreneurPulse.com.au  Fact Sheet for Healthcare Providers:  IncredibleEmployment.be  This test is no t yet approved or cleared by the [place] FDA and  has been authorized for detection and/or diagnosis of SARS-CoV-2 by FDA  under an Emergency Use Authorization (EUA). This EUA will remain  in effect (meaning this test can be used) for the duration of the COVID-19 declaration under Section 564(b)(1) of the Act, 21 U.S.C.section 360bbb-3(b)(1), unless the authorization is terminated  or revoked sooner.       Influenza A by PCR NEGATIVE NEGATIVE Final   Influenza B by PCR NEGATIVE NEGATIVE Final    Comment: (NOTE) The Xpert Xpress SARS-CoV-2/FLU/RSV plus assay is intended as an aid in the diagnosis of influenza from Nasopharyngeal swab specimens and should not be used as a sole basis  for treatment. Nasal washings and aspirates are unacceptable for Xpert Xpress SARS-CoV-2/FLU/RSV testing.  Fact Sheet for Patients: EntrepreneurPulse.com.au  Fact Sheet for Healthcare Providers: IncredibleEmployment.be  This test is not yet approved or cleared by the [place] FDA and has been authorized for detection and/or diagnosis of SARS-CoV-2 by FDA under an Emergency Use Authorization (EUA). This EUA will remain in effect (meaning this test can be used) for the duration of the COVID-19 declaration under Section 564(b)(1) of the Act, 21 U.S.C. section 360bbb-3(b)(1), unless the authorization is terminated or revoked.  Performed at [facility], [address], [place], [place] [address]   Urine culture     Status: None   Collection Time: 03/22/21  3:27 PM   Specimen: Urine, Random  Result Value Ref Range Status   Specimen Description   Final    URINE, RANDOM Performed at [facility], [address], [place], [place] [address]    Special Requests   Final    NONE Performed at [facility], [address] [place], [place] [address]    Culture   Final    NO GROWTH Performed at [facility], [place] [address], [place], [place] [address]    Report Status 03/24/2021 FINAL  Final    Procedures and diagnostic studies:  ECHOCARDIOGRAM COMPLETE  Result Date: 03/23/2021    ECHOCARDIOGRAM REPORT   [name] Date of Exam: 03/23/2021 Medical Rec #:  [id number]    Height:       64.0 in Accession #:    [id number]   Weight:       152.9 lb Date of Birth:  [birth year]    BSA:          1.745 m Patient Age:    69 years     BP:           140/67 mmHg Patient Gender: F            HR:           81 bpm. Exam Location:  [facility] Procedure: 2D Echo, Cardiac Doppler, Color Doppler and Strain Analysis Indications:     Atrial Fibrillation I48.91  History:         Patient has no prior history of Echocardiogram  examinations.                  Stroke. Mitral valve disorder.  Sonographer:     Sherrie Sport RDCS (AE) Referring Phys:  [id number] Collier Bullock Diagnosing Phys: Isaias Cowman MD  Sonographer Comments: Global longitudinal strain was attempted. IMPRESSIONS  1. Left ventricular ejection fraction, by estimation, is 60 to 65%. The left ventricle has normal function. The left ventricle has no regional wall motion abnormalities. Left ventricular diastolic parameters were normal.  2. Right ventricular systolic function is normal. The right ventricular size is normal.  3. The mitral valve  is normal in structure. Mild to moderate mitral valve regurgitation. Mild mitral stenosis.  4. Tricuspid valve regurgitation is moderate.  5. The aortic valve is normal in structure. Aortic valve regurgitation is not visualized. No aortic stenosis is present.  6. The inferior vena cava is normal in size with greater than 50% respiratory variability, suggesting right atrial pressure of 3 mmHg. FINDINGS  Left Ventricle: Left ventricular ejection fraction, by estimation, is 60 to 65%. The left ventricle has normal function. The left ventricle has no regional wall motion abnormalities. The left ventricular internal cavity size was normal in size. There is  no left ventricular hypertrophy. Left ventricular diastolic parameters were normal. Right Ventricle: The right ventricular size is normal. No increase in right ventricular wall thickness. Right ventricular systolic function is normal. Left Atrium: Left atrial size was normal in size. Right Atrium: Right atrial size was normal in size. Pericardium: There is no evidence of pericardial effusion. Mitral Valve: The mitral valve is normal in structure. Mild to moderate mitral valve regurgitation. Mild mitral valve stenosis. MV peak gradient, 11.8 mmHg. The mean mitral valve gradient is 5.0 mmHg. Tricuspid Valve: The tricuspid valve is normal in structure. Tricuspid valve regurgitation is  moderate . No evidence of tricuspid stenosis. Aortic Valve: The aortic valve is normal in structure. Aortic valve regurgitation is not visualized. No aortic stenosis is present. Aortic valve mean gradient measures 4.0 mmHg. Aortic valve peak gradient measures 6.1 mmHg. Aortic valve area, by VTI measures 2.71 cm. Pulmonic Valve: The pulmonic valve was normal in structure. Pulmonic valve regurgitation is not visualized. No evidence of pulmonic stenosis. Aorta: The aortic root is normal in size and structure. Venous: The inferior vena cava is normal in size with greater than 50% respiratory variability, suggesting right atrial pressure of 3 mmHg. IAS/Shunts: No atrial level shunt detected by color flow Doppler.  LEFT VENTRICLE PLAX 2D LVIDd:         4.28 cm  Diastology LVIDs:         2.50 cm  LV e' medial:    7.07 cm/s LV PW:         0.99 cm  LV E/e' medial:  18.7 LV IVS:        0.71 cm  LV e' lateral:   5.44 cm/s LVOT diam:     2.00 cm  LV E/e' lateral: 24.3 LV SV:         69 LV SV Index:   39 LVOT Area:     3.14 cm                          3D Volume EF:                         3D EF:        56 %                         LV EDV:       95 ml                         LV ESV:       42 ml                         LV SV:        53 ml RIGHT VENTRICLE  RV Basal diam:  3.22 cm RV S prime:     15.60 cm/s TAPSE (M-mode): 3.4 cm LEFT ATRIUM             Index       RIGHT ATRIUM           Index LA diam:        3.30 cm 1.89 cm/m  RA Area:     16.50 cm LA Vol (A2C):   74.0 ml 42.40 ml/m RA Volume:   48.60 ml  27.85 ml/m LA Vol (A4C):   38.3 ml 21.94 ml/m LA Biplane Vol: 54.2 ml 31.05 ml/m  AORTIC VALVE                   PULMONIC VALVE AV Area (Vmax):    2.40 cm    PV Vmax:        0.66 m/s AV Area (Vmean):   2.22 cm    PV Peak grad:   1.7 mmHg AV Area (VTI):     2.71 cm    RVOT Peak grad: 1 mmHg AV Vmax:           123.00 cm/s AV Vmean:          92.250 cm/s AV VTI:            0.254 m AV Peak Grad:      6.1 mmHg AV Mean Grad:       4.0 mmHg LVOT Vmax:         94.00 cm/s LVOT Vmean:        65.300 cm/s LVOT VTI:          0.219 m LVOT/AV VTI ratio: 0.86  AORTA Ao Root diam: 2.90 cm MITRAL VALVE                TRICUSPID VALVE MV Area (PHT): 3.17 cm     TR Peak grad:   14.6 mmHg MV Area VTI:   1.51 cm     TR Vmax:        191.00 cm/s MV Peak grad:  11.8 mmHg MV Mean grad:  5.0 mmHg     SHUNTS MV Vmax:       1.72 m/s     Systemic VTI:  0.22 m MV Vmean:      102.0 cm/s   Systemic Diam: 2.00 cm MV Decel Time: 239 msec MV E velocity: 132.00 cm/s MV A velocity: 125.00 cm/s MV E/A ratio:  1.06 Isaias Cowman MD Electronically signed by Isaias Cowman MD Signature Date/Time: 03/23/2021/1:50:17 PM    Final                LOS: 2 days   Ronda Rajkumar  [facility]   Pager on [email or web address]. If 7PM-7AM, please contact night-coverage at [email or web address]     03/24/2021, 5:11 PM

## 2021-03-24 NOTE — Progress Notes (Signed)
Mobility Specialist - Progress Note   03/24/21 1700  Mobility  Activity Ambulated in room;Transferred:  Bed to chair  Level of Assistance Standby assist, set-up cues, supervision of patient - no hands on  Assistive Device Front wheel walker  Distance Ambulated (ft) 60 ft  Mobility Response Tolerated well  Mobility performed by Mobility specialist  $Mobility charge 1 Mobility    During mobility: 104 HR, 92%  SpO2 Post-mobility: 90 HR, 95% SpO2   Pt ambulated in room with RW. No Lob. Pt able to self-identify mistakes, such as straightening posture as she tends to lean to the R during ambulation. Pt reports working with home health PT PTA. Pt able to sit EOB without physical assistance. Good standing balance as she is able to don briefs with modI prior to ambulation. Denied dizziness. Denied SOB, however O2 briefly desat to 87% during ambulation on RA but quickly rebounds to 92%. PLB engaged. Pt transferred to [facility] for BM before resting in recliner. MinA for peri-care. Pt left in recliner, family at bedside.    Kathee Delton Mobility Specialist 03/24/21, 5:23 PM

## 2021-03-24 NOTE — Progress Notes (Signed)
   03/23/21 2241  Assess: MEWS Score  Temp 98.2 F (36.8 C)  BP 109/69  Pulse Rate (!) 151  Resp 18  SpO2 94 %  O2 Device Nasal Cannula  O2 Flow Rate (L/min) 2 L/min  Assess: MEWS Score  MEWS Temp 0  MEWS Systolic 0  MEWS Pulse 3  MEWS RR 0  MEWS LOC 0  MEWS Score 3  MEWS Score Color Yellow  Assess: if the MEWS score is Yellow or Red  Were vital signs taken at a resting state? Yes  Focused Assessment No change from prior assessment  Early Detection of Sepsis Score *See Row Information* Low  MEWS guidelines implemented *See Row Information* Yes  Treat  MEWS Interventions Escalated (See documentation below)  Take Vital Signs  Increase Vital Sign Frequency  Yellow: Q 2hr X 2 then Q 4hr X 2, if remains yellow, continue Q 4hrs  Escalate  MEWS: Escalate Yellow: discuss with charge nurse/RN and consider discussing with provider and RRT  Notify: Charge Nurse/RN  Name of Charge Nurse/RN Notified Holley Dexter  Date Charge Nurse/RN Notified 03/24/21  Time Charge Nurse/RN Notified 2300  Notify: Provider  Provider Name/Title Dr.Optyd  Date Provider Notified 03/23/21  Time Provider Notified 2245  Notification Type  (secure chat)  Notification Reason Change in status  Provider response See new orders  Date of Provider Response 03/24/21  Document  Patient Outcome Stabilized after interventions  Progress note created (see row info) Yes   Patient went into afiv with rvr. Notified by central telemetry tech. EKG performed to verify.  No history of  Afib. Bolus and iv metoprolol administered.

## 2021-03-25 LAB — POTASSIUM: Potassium: 4.1 mmol/L (ref 3.5–5.1)

## 2021-03-25 LAB — MAGNESIUM: Magnesium: 2.2 mg/dL (ref 1.7–2.4)

## 2021-03-25 MED ORDER — FUROSEMIDE 40 MG PO TABS
20.0000 mg | ORAL_TABLET | Freq: Every day | ORAL | Status: DC
Start: 2021-03-25 — End: 2021-05-16

## 2021-03-25 MED ORDER — DILTIAZEM HCL ER COATED BEADS 120 MG PO CP24: 120.0000 mg | ORAL_CAPSULE | Freq: Every day | ORAL | 0 refills | Status: DC

## 2021-03-25 MED ORDER — AMOXICILLIN-POT CLAVULANATE 875-125 MG PO TABS: 1.0000 | ORAL_TABLET | Freq: Two times a day (BID) | ORAL | 0 refills | Status: AC

## 2021-03-25 NOTE — Evaluation (Signed)
Physical Therapy Evaluation Patient Details Name: Carla Kelly MRN: [id number] DOB: 07-Dec-1932 Today's Date: 03/25/2021   History of Present Illness  Pt admitted for sepsis. HIstory includes depression, asthma, IBS, CVA, and HLD.  Clinical Impression  Pt is a pleasant 87 year old female who was admitted for sepsis. Pt performs bed mobility/transfers with mod I and ambulation with supervision and RW. Pt demonstrates deficits with endurance/mobility. Currently receiving HHPT prior to admission. Would benefit from skilled PT to address above deficits and promote optimal return to PLOF. Recommend transition to [place] upon discharge from acute hospitalization.    Follow Up Recommendations Home health PT    Equipment Recommendations  None recommended by PT    Recommendations for Other Services       Precautions / Restrictions Precautions Precautions: None Restrictions Weight Bearing Restrictions: No      Mobility  Bed Mobility Overal bed mobility: Modified Independent             General bed mobility comments: use of railing. Once seated, able to sit with upright posture    Transfers Overall transfer level: Modified independent Equipment used: Rolling walker (2 wheeled)             General transfer comment: safe technique. Once standing, upright posture. Use of RW  Ambulation/Gait Ambulation/Gait assistance: Supervision Gait Distance (Feet): 50 Feet Assistive device: Rolling walker (2 wheeled) Gait Pattern/deviations: Step-through pattern     General Gait Details: ambulated room distances with RW. Pt fatigues with toileting, requesting to defer further ambulation at htis time. Safe technique with RW including balance  Stairs            Wheelchair Mobility    Modified Rankin (Stroke Patients Only)       Balance Overall balance assessment: Needs assistance Sitting-balance support: Feet supported Sitting balance-Leahy Scale: Good     Standing balance  support: Bilateral upper extremity supported Standing balance-Leahy Scale: Good                               Pertinent Vitals/Pain Pain Assessment: No/denies pain    Home Living Family/patient expects to be discharged to:: Private residence Living Arrangements: Children Available Help at Discharge: Family;Available 24 hours/day Type of Home: House Home Access: Stairs to enter Entrance Stairs-Rails: Left;Right (wide) Entrance Stairs-Number of Steps: "a few" Home Layout: One level Home Equipment: Walker - 2 wheels;Cane - single point;Hospital bed Additional Comments: previously lives alone, however currently living with daughter. Unsure of exact home layout    Prior Function Level of Independence: Independent with assistive device(s)         Comments: using SPC more frequently     Hand Dominance        Extremity/Trunk Assessment   Upper Extremity Assessment Upper Extremity Assessment: Overall WFL for tasks assessed    Lower Extremity Assessment Lower Extremity Assessment: Generalized weakness (B LE grossly 4+/5)       Communication   Communication: No difficulties  Cognition Arousal/Alertness: Awake/alert Behavior During Therapy: WFL for tasks assessed/performed Overall Cognitive Status: Within Functional Limits for tasks assessed                                        General Comments      Exercises Other Exercises Other Exercises: ambulated to bathroom with supervision. Needs cga for transition to  low commode. Supervison for peri care. Brief donned and purewick replaced due to chronic incontience post toileting.   Assessment/Plan    PT Assessment Patient needs continued PT services  PT Problem List Decreased strength;Decreased activity tolerance       PT Treatment Interventions Gait training;Therapeutic activities;Therapeutic exercise    PT Goals (Current goals can be found in the Care Plan section)  Acute Rehab PT  Goals Patient Stated Goal: to go home PT Goal Formulation: With patient Time For Goal Achievement: 04/08/21 Potential to Achieve Goals: Good    Frequency Min 2X/week   Barriers to discharge        Co-evaluation               AM-PAC PT "6 Clicks" Mobility  Outcome Measure Help needed turning from your back to your side while in a flat bed without using bedrails?: None Help needed moving from lying on your back to sitting on the side of a flat bed without using bedrails?: None Help needed moving to and from a bed to a chair (including a wheelchair)?: None Help needed standing up from a chair using your arms (e.g., wheelchair or bedside chair)?: None Help needed to walk in hospital room?: A Little Help needed climbing 3-5 steps with a railing? : A Little 6 Click Score: 22    End of Session Equipment Utilized During Treatment: Gait belt Activity Tolerance: Patient tolerated treatment well Patient left: in bed Nurse Communication: Mobility status PT Visit Diagnosis: Muscle weakness (generalized) (M62.81)    Time: 1610-9604 PT Time Calculation (min) (ACUTE ONLY): 26 min   Charges:   PT Evaluation $PT Eval Low Complexity: 1 Low PT Treatments $Therapeutic Activity: 8-22 mins        Greggory Stallion, PT, DPT [phone]   Carla Kelly 03/25/2021, 10:06 AM

## 2021-03-25 NOTE — Progress Notes (Signed)
Patient discharged at this time via wheelchair to home with all belongings. PIV/Tele removed. Patient verbalized discharge instructions per printed AVS. NAD noted upon departure. Family at bedside.

## 2021-03-25 NOTE — Evaluation (Signed)
Occupational Therapy Evaluation Patient Details Name: Carla Kelly MRN: [id number] DOB: 05/27/1933 Today's Date: 03/25/2021    History of Present Illness Pt admitted for sepsis. HIstory includes depression, asthma, IBS, CVA, and HLD.   Clinical Impression   Pt seen for OT evaluation this date in setting of acute hospitalization d/t sepsis 2/2 PNA. Pt reports being INDEP with self care at baseline and requiring some assist for heavier IADLs in the home such as vaccuming. Pt lives with her daughter and SIL in [facility] with a walk-out basement with BR/BA on first floor and walk in shower. Pt was using RW for fxl mobility in the home and community, but had been receiving HHPT and had recently graduated to [facility] before becoming ill in the last week. Pt presents this date with some decreased activity tolerance, requiring SUPV for fxl mobility and ADL transfers as well as standing self care. Pt left in bed with all needs met and in reach. Will continue to follow acutely and anticiapte that pt could benefit from [facility] f/u in the home setting to encourage and educate re: energy conservation versus re-gaining tolerance as able.     Follow Up Recommendations  Home health OT    Equipment Recommendations  None recommended by OT    Recommendations for Other Services       Precautions / Restrictions Precautions Precautions: None Restrictions Weight Bearing Restrictions: No      Mobility Bed Mobility Overal bed mobility: Modified Independent             General bed mobility comments: use of railing. Once seated, able to sit with upright posture    Transfers Overall transfer level: Modified independent Equipment used: Rolling walker (2 wheeled) Transfers: Sit to/from Stand Sit to Stand: Modified independent (Device/Increase time);Supervision         General transfer comment: increased time, use of RW, cues to back up all the way to the bed before sitting.    Balance Overall balance  assessment: Needs assistance Sitting-balance support: Feet supported Sitting balance-Leahy Scale: Good     Standing balance support: Bilateral upper extremity supported Standing balance-Leahy Scale: Good                             ADL either performed or assessed with clinical judgement   ADL Overall ADL's : Needs assistance/impaired                                       General ADL Comments: MOD I/SUPV with ADLs/ADL mobility, requires SETUP for some heavier/more dynamic tasks d/t SOB and decreased standing/activity tolerance which she reports is not her norm     Vision Baseline Vision/History: Wears glasses Wears Glasses: At all times Patient Visual Report: No change from baseline       Perception     Praxis      Pertinent Vitals/Pain Pain Assessment: No/denies pain     Hand Dominance Right   Extremity/Trunk Assessment Upper Extremity Assessment Upper Extremity Assessment: Overall WFL for tasks assessed;Generalized weakness (ROM WFL, MMT grossly 4-/5)   Lower Extremity Assessment Lower Extremity Assessment: Overall WFL for tasks assessed;Generalized weakness (ROM WFL, MMT grossly 4-/5)       Communication Communication Communication: No difficulties   Cognition Arousal/Alertness: Awake/alert Behavior During Therapy: WFL for tasks assessed/performed Overall Cognitive Status: Within Functional Limits for tasks assessed  General Comments       Exercises Exercises: Other exercises Other Exercises Other Exercises: OT engages pt in standing, fxl mobility, LB dressing, etc. noted that while pt is able to physically perform these tasks at SUPV/MOD I level, she struggles with fxl activity tolerance and requires 3-4 standing rest breaks and immediately wants to get back in bed after performing fxl mobilit d/t fatigue. Noted RR increase to ~26-28 with light activity, able to resume normal  breathing pattern within ~30 seconds of standing or seated rest. Other Exercises: Ed re: incentive spirometer use to improve depth of inspiration   Shoulder Instructions      Home Living Family/patient expects to be discharged to:: Private residence Living Arrangements: Children Available Help at Discharge: Family;Available 24 hours/day Type of Home: House Home Access: Stairs to enter [employer] of Steps: 3 shallow steps from garage (has large metal bar from tool box that is purposed as R rail), 6 out front (wide railing is out front) Entrance Stairs-Rails: Left;Right Home Layout: One level               Home Equipment: Walker - 2 wheels;Cane - single point;Hospital bed;Shower seat;Bedside commode   Additional Comments: previously lives alone, however currently living with daughter (since Oct 2021 back sx following a fall and subsequent kyphoplasty).      Prior Functioning/Environment Level of Independence: Needs assistance  Gait / Transfers Assistance Needed: was using walker with HHPT initially, but has recently graduated to [employer] ADL's / Homemaking Assistance Needed: Pt able to perform all basic self care I'ly. Bathes body in shower (on seat) but stands at sink to wash hair d/t h/o L ear drum burst. Pt able to cook breakfast/dinner for herself and do laundry. Family assists with heavier IADLs such as vacuuming and cooking big meals.   Comments: dtr/SIL provide transportation, pt has not driven since before back sx in Oct 2021        OT Problem List: Decreased activity tolerance;Cardiopulmonary status limiting activity;Decreased strength      OT Treatment/Interventions: Self-care/ADL training;Therapeutic activities;Energy conservation;Therapeutic exercise    OT Goals(Current goals can be found in the care plan section) Acute Rehab OT Goals Patient Stated Goal: to go home OT Goal Formulation: With patient Time For Goal Achievement: 04/08/21 Potential to  Achieve Goals: Good ADL Goals Pt Will Perform Grooming: with modified independence;standing (to complete 2-3 g/h tasks in standing to increase fxl activity tolerance with 1 standing rest break as needed.) Additional ADL Goal #1: Pt will complete one standing IADL with G dynamic standing balance with AD PRN, self-implementing standign rest breaks for EC as needed.  OT Frequency: Min 1X/week   Barriers to D/C:            Co-evaluation              AM-PAC OT "6 Clicks" Daily Activity     Outcome Measure Help from another person eating meals?: None Help from another person taking care of personal grooming?: None Help from another person toileting, which includes using toliet, bedpan, or urinal?: None Help from another person bathing (including washing, rinsing, drying)?: A Little Help from another person to put on and taking off regular upper body clothing?: None Help from another person to put on and taking off regular lower body clothing?: A Little 6 Click Score: 22   End of Session Equipment Utilized During Treatment: Gait belt;Rolling walker Nurse Communication: Mobility status;Other (comment) (need for incentive spirometer)  Activity Tolerance: Patient tolerated  treatment well Patient left: in bed;with call bell/phone within reach  OT Visit Diagnosis: Muscle weakness (generalized) (M62.81)                Time: 3762-8315 OT Time Calculation (min): 51 min Charges:  OT General Charges $OT Visit: 1 Visit OT Evaluation $OT Eval Moderate Complexity: 1 Mod OT Treatments $Self Care/Home Management : 23-37 mins $Therapeutic Activity: 8-22 mins  Gerrianne Scale, MS, OTR/L ascom [phone] 03/25/21, 10:41 AM

## 2021-03-25 NOTE — Progress Notes (Signed)
Mobility Specialist - Progress Note   03/25/21 1200  Mobility  Activity Ambulated in room  Range of Motion/Exercises All extremities (UB/LB dressing)  Level of Assistance Standby assist, set-up cues, supervision of patient - no hands on  Assistive Device Front wheel walker  Distance Ambulated (ft) 30 ft  Mobility Response Tolerated well  Mobility performed by Mobility specialist  $Mobility charge 1 Mobility    Pt ambulating room upon arrival using furniture for balance. Mobility offered assistance, providing RW. Pt attempting to get dressed for d/c this date. Pt able to doff/don UB and LB garments with supervision.   Kathee Delton Mobility Specialist 03/25/21, 12:59 PM

## 2021-03-25 NOTE — Discharge Summary (Signed)
Physician Discharge Summary  Carla Kelly TKW:[id number] DOB: Apr 18, 1933 DOA: 03/22/2021  PCP: Abner Greenspan, MD  Admit date: 03/22/2021 Discharge date: 03/25/2021  Discharge disposition: Home with home PT and OT   Recommendations for Outpatient Follow-Up:   Follow-up with PCP in 1 week   Discharge Diagnosis:   Principal Problem:   Sepsis ([place]) Active Problems:   Hypothyroidism   CAP (community acquired pneumonia)   Atrial fibrillation with RVR ([place])    Discharge Condition: Stable.  Diet recommendation:  Diet Order            Diet - low sodium heart healthy           Diet 2 gram sodium Room service appropriate? Yes; Fluid consistency: Thin  Diet effective now                   Code Status: Full Code     Hospital Course:   [name] is an 87 year old woman with medical history significant for depression, asthma, dyslipidemia, hypothyroidism, who presented to the hospital because of fever (T-max of 102 F), productive cough and congestion.  She was tachypneic, tachycardic and hypotensive in the emergency room.  She was admitted to the hospital for sepsis secondary to pneumonia.  She was also found to have atrial fibrillation with RVR.  She was treated with IV fluids and empiric IV antibiotics.  She converted to normal sinus rhythm.  She was started on oral Cardizem because of paroxysms of rapid A. fib.  Patient and her daughter declined anticoagulation for stroke prophylaxis.  She also had hypokalemia that was treated.  She has multiple skin lesions this summer off which she has stopped 40 years.  Outpatient follow-up with dermatologist was recommended.  Her condition has improved and she is deemed stable for discharge home.  Discharge plan was discussed with the patient and her daughter (over speaker phone).      Discharge Exam:    Vitals:   03/24/21 2144 03/25/21 0422 03/25/21 0611 03/25/21 1034  BP: 138/68 140/77  (!) 128/57  Pulse: 88 88  81   Resp: 16 18    Temp: 99.5 F (37.5 C) 100.2 F (37.9 C) 98.9 F (37.2 C) 98.4 F (36.9 C)  TempSrc: Oral Oral  Oral  SpO2: 95% 93%  97%  Weight:  69.7 kg    Height:         GEN: NAD SKIN: Multiple purpura lesions on the lower chest/abdomen (? moles) EYES: EOMI ENT: MMM CV: RRR PULM: CTA B ABD: soft, ND, NT, +BS CNS: AAO x 3, non focal EXT: No edema or tenderness   The results of significant diagnostics from this hospitalization (including imaging, microbiology, ancillary and laboratory) are listed below for reference.     Procedures and Diagnostic Studies:   ECHOCARDIOGRAM COMPLETE  Result Date: 03/23/2021    ECHOCARDIOGRAM REPORT   [name] Date of Exam: 03/23/2021 Medical Rec #:  [id number]    Height:       64.0 in Accession #:    [id number]   Weight:       152.9 lb Date of Birth:  [birth year]    BSA:          1.745 m Patient Age:    87 years     BP:           140/67 mmHg Patient Gender: F  HR:           81 bpm. Exam Location:  [facility] Procedure: 2D Echo, Cardiac Doppler, Color Doppler and Strain Analysis Indications:     Atrial Fibrillation I48.91  History:         Patient has no prior history of Echocardiogram examinations.                  Stroke. Mitral valve disorder.  Sonographer:     Carla Kelly RDCS (AE) Referring Phys:  [id number] Carla Kelly Diagnosing Phys: Carla Cowman MD  Sonographer Comments: Global longitudinal strain was attempted. IMPRESSIONS  1. Left ventricular ejection fraction, by estimation, is 60 to 65%. The left ventricle has normal function. The left ventricle has no regional wall motion abnormalities. Left ventricular diastolic parameters were normal.  2. Right ventricular systolic function is normal. The right ventricular size is normal.  3. The mitral valve is normal in structure. Mild to moderate mitral valve regurgitation. Mild mitral stenosis.  4. Tricuspid valve regurgitation is moderate.  5. The aortic valve is normal  in structure. Aortic valve regurgitation is not visualized. No aortic stenosis is present.  6. The inferior vena cava is normal in size with greater than 50% respiratory variability, suggesting right atrial pressure of 3 mmHg. FINDINGS  Left Ventricle: Left ventricular ejection fraction, by estimation, is 60 to 65%. The left ventricle has normal function. The left ventricle has no regional wall motion abnormalities. The left ventricular internal cavity size was normal in size. There is  no left ventricular hypertrophy. Left ventricular diastolic parameters were normal. Right Ventricle: The right ventricular size is normal. No increase in right ventricular wall thickness. Right ventricular systolic function is normal. Left Atrium: Left atrial size was normal in size. Right Atrium: Right atrial size was normal in size. Pericardium: There is no evidence of pericardial effusion. Mitral Valve: The mitral valve is normal in structure. Mild to moderate mitral valve regurgitation. Mild mitral valve stenosis. MV peak gradient, 11.8 mmHg. The mean mitral valve gradient is 5.0 mmHg. Tricuspid Valve: The tricuspid valve is normal in structure. Tricuspid valve regurgitation is moderate . No evidence of tricuspid stenosis. Aortic Valve: The aortic valve is normal in structure. Aortic valve regurgitation is not visualized. No aortic stenosis is present. Aortic valve mean gradient measures 4.0 mmHg. Aortic valve peak gradient measures 6.1 mmHg. Aortic valve area, by VTI measures 2.71 cm. Pulmonic Valve: The pulmonic valve was normal in structure. Pulmonic valve regurgitation is not visualized. No evidence of pulmonic stenosis. Aorta: The aortic root is normal in size and structure. Venous: The inferior vena cava is normal in size with greater than 50% respiratory variability, suggesting right atrial pressure of 3 mmHg. IAS/Shunts: No atrial level shunt detected by color flow Doppler.  LEFT VENTRICLE PLAX 2D LVIDd:         4.28 cm   Diastology LVIDs:         2.50 cm  LV e' medial:    7.07 cm/s LV PW:         0.99 cm  LV E/e' medial:  18.7 LV IVS:        0.71 cm  LV e' lateral:   5.44 cm/s LVOT diam:     2.00 cm  LV E/e' lateral: 24.3 LV SV:         69 LV SV Index:   39 LVOT Area:     3.14 cm  3D Volume EF:                         3D EF:        56 %                         LV EDV:       95 ml                         LV ESV:       42 ml                         LV SV:        53 ml RIGHT VENTRICLE RV Basal diam:  3.22 cm RV S prime:     15.60 cm/s TAPSE (M-mode): 3.4 cm LEFT ATRIUM             Index       RIGHT ATRIUM           Index LA diam:        3.30 cm 1.89 cm/m  RA Area:     16.50 cm LA Vol (A2C):   74.0 ml 42.40 ml/m RA Volume:   48.60 ml  27.85 ml/m LA Vol (A4C):   38.3 ml 21.94 ml/m LA Biplane Vol: 54.2 ml 31.05 ml/m  AORTIC VALVE                   PULMONIC VALVE AV Area (Vmax):    2.40 cm    PV Vmax:        0.66 m/s AV Area (Vmean):   2.22 cm    PV Peak grad:   1.7 mmHg AV Area (VTI):     2.71 cm    RVOT Peak grad: 1 mmHg AV Vmax:           123.00 cm/s AV Vmean:          92.250 cm/s AV VTI:            0.254 m AV Peak Grad:      6.1 mmHg AV Mean Grad:      4.0 mmHg LVOT Vmax:         94.00 cm/s LVOT Vmean:        65.300 cm/s LVOT VTI:          0.219 m LVOT/AV VTI ratio: 0.86  AORTA Ao Root diam: 2.90 cm MITRAL VALVE                TRICUSPID VALVE MV Area (PHT): 3.17 cm     TR Peak grad:   14.6 mmHg MV Area VTI:   1.51 cm     TR Vmax:        191.00 cm/s MV Peak grad:  11.8 mmHg MV Mean grad:  5.0 mmHg     SHUNTS MV Vmax:       1.72 m/s     Systemic VTI:  0.22 m MV Vmean:      102.0 cm/s   Systemic Diam: 2.00 cm MV Decel Time: 239 msec MV E velocity: 132.00 cm/s MV A velocity: 125.00 cm/s MV E/A ratio:  1.06 Carla Cowman MD Electronically signed by Carla Cowman MD Signature Date/Time: 03/23/2021/1:50:17 PM    Final      Labs:   Basic Metabolic Panel: Recent Labs  Lab  03/22/21 1233  03/23/21 0515 03/24/21 0434 03/25/21 0517  NA 138 135  --   --   K 4.6 3.4* 3.6 4.1  CL 107 105  --   --   CO2 22 22  --   --   GLUCOSE 117* 106*  --   --   BUN 17 16  --   --   CREATININE 0.70 0.56  --   --   CALCIUM 8.8* 8.0*  --   --   MG  --   --  1.9 2.2   GFR Estimated Creatinine Clearance: 48.4 mL/min (by C-G formula based on SCr of 0.56 mg/dL). Liver Function Tests: Recent Labs  Lab 03/22/21 1233  AST 30  ALT 19  ALKPHOS 73  BILITOT 1.4*  PROT 7.1  ALBUMIN 3.4*   No results for input(s): LIPASE, AMYLASE in the last 168 hours. No results for input(s): AMMONIA in the last 168 hours. Coagulation profile Recent Labs  Lab 03/22/21 1233 03/23/21 0515  INR 1.1 1.1    CBC: Recent Labs  Lab 03/22/21 1233 03/23/21 0515  WBC 7.0 7.6  NEUTROABS 4.8  --   HGB 11.9* 10.5*  HCT 35.6* 31.6*  MCV 93.9 94.6  PLT 189 182   Cardiac Enzymes: No results for input(s): CKTOTAL, CKMB, CKMBINDEX, TROPONINI in the last 168 hours. BNP: Invalid input(s): POCBNP CBG: Recent Labs  Lab 03/23/21 0800 03/23/21 1206  GLUCAP 109* 113*   D-Dimer No results for input(s): DDIMER in the last 72 hours. Hgb A1c No results for input(s): HGBA1C in the last 72 hours. Lipid Profile No results for input(s): CHOL, HDL, LDLCALC, TRIG, CHOLHDL, LDLDIRECT in the last 72 hours. Thyroid function studies Recent Labs    03/22/21 1658  TSH 0.452   Anemia work up No results for input(s): VITAMINB12, FOLATE, FERRITIN, TIBC, IRON, RETICCTPCT in the last 72 hours. Microbiology Recent Results (from the past 240 hour(s))  Culture, blood (Routine x 2)     Status: None (Preliminary result)   Collection Time: 03/22/21 12:33 PM   Specimen: BLOOD  Result Value Ref Range Status   Specimen Description BLOOD LEFT ANTECUBITAL  Final   Special Requests   Final    BOTTLES DRAWN AEROBIC AND ANAEROBIC Blood Culture results may not be optimal due to an excessive volume of blood received in culture  bottles   Culture   Final    NO GROWTH 3 DAYS Performed at [facility], [address], [place], [place] [address]    Report Status PENDING  Incomplete  Culture, blood (Routine x 2)     Status: None (Preliminary result)   Collection Time: 03/22/21 12:40 PM   Specimen: BLOOD  Result Value Ref Range Status   Specimen Description BLOOD RIGHT ANTECUBITAL  Final   Special Requests   Final    BOTTLES DRAWN AEROBIC AND ANAEROBIC Blood Culture results may not be optimal due to an excessive volume of blood received in culture bottles   Culture   Final    NO GROWTH 3 DAYS Performed at [facility], [address], [place], [place] [address]    Report Status PENDING  Incomplete  Resp Panel by RT-PCR (Flu A&B, Covid) Nasopharyngeal Swab     Status: None   Collection Time: 03/22/21  1:05 PM   Specimen: Nasopharyngeal Swab; Nasopharyngeal(NP) swabs in vial transport medium  Result Value Ref Range Status   SARS Coronavirus 2 by RT PCR NEGATIVE NEGATIVE Final    Comment: (  NOTE) SARS-CoV-2 target nucleic acids are NOT DETECTED.  The SARS-CoV-2 RNA is generally detectable in upper respiratory specimens during the acute phase of infection. The lowest concentration of SARS-CoV-2 viral copies this assay can detect is 138 copies/mL. A negative result does not preclude SARS-Cov-2 infection and should not be used as the sole basis for treatment or other patient management decisions. A negative result may occur with  improper specimen collection/handling, submission of specimen other than nasopharyngeal swab, presence of viral mutation(s) within the areas targeted by this assay, and inadequate number of viral copies(<138 copies/mL). A negative result must be combined with clinical observations, patient history, and epidemiological information. The expected result is Negative.  Fact Sheet for Patients:  EntrepreneurPulse.com.au  Fact Sheet for Healthcare  Providers:  IncredibleEmployment.be  This test is no t yet approved or cleared by the [place] FDA and  has been authorized for detection and/or diagnosis of SARS-CoV-2 by FDA under an Emergency Use Authorization (EUA). This EUA will remain  in effect (meaning this test can be used) for the duration of the COVID-19 declaration under Section 564(b)(1) of the Act, 21 U.S.C.section 360bbb-3(b)(1), unless the authorization is terminated  or revoked sooner.       Influenza A by PCR NEGATIVE NEGATIVE Final   Influenza B by PCR NEGATIVE NEGATIVE Final    Comment: (NOTE) The Xpert Xpress SARS-CoV-2/FLU/RSV plus assay is intended as an aid in the diagnosis of influenza from Nasopharyngeal swab specimens and should not be used as a sole basis for treatment. Nasal washings and aspirates are unacceptable for Xpert Xpress SARS-CoV-2/FLU/RSV testing.  Fact Sheet for Patients: EntrepreneurPulse.com.au  Fact Sheet for Healthcare Providers: IncredibleEmployment.be  This test is not yet approved or cleared by the [place] FDA and has been authorized for detection and/or diagnosis of SARS-CoV-2 by FDA under an Emergency Use Authorization (EUA). This EUA will remain in effect (meaning this test can be used) for the duration of the COVID-19 declaration under Section 564(b)(1) of the Act, 21 U.S.C. section 360bbb-3(b)(1), unless the authorization is terminated or revoked.  Performed at [facility], [address], [place], [place] [address]   Urine culture     Status: None   Collection Time: 03/22/21  3:27 PM   Specimen: Urine, Random  Result Value Ref Range Status   Specimen Description   Final    URINE, RANDOM Performed at [facility], [address] [place], [place] [address]    Special Requests   Final    NONE Performed at [facility], [address], [place], [place] [address]    Culture    Final    NO GROWTH Performed at [facility], [place] [address], [place], [place] [address]    Report Status 03/24/2021 FINAL  Final     Discharge Instructions:   Discharge Instructions    Diet - low sodium heart healthy   Complete by: As directed    Face-to-face encounter (required for Medicare/Medicaid patients)   Complete by: As directed    I Dixon certify that this patient is under my care and that I, or a nurse practitioner or physician's assistant working with me, had a face-to-face encounter that meets the physician face-to-face encounter requirements with this patient on 03/25/2021. The encounter with the patient was in whole, or in part for the following medical condition(s) which is the primary reason for home health care (List medical condition): unsteady gait   The encounter with the patient  was in whole, or in part, for the following medical condition, which is the primary reason for home health care: Unstaedy gait   I certify that, based on my findings, the following services are medically necessary home health services: Physical therapy   Reason for Medically Necessary Home Health Services: Therapy- Personnel officer, Public librarian   My clinical findings support the need for the above services: Unsafe ambulation due to balance issues   Further, I certify that my clinical findings support that this patient is homebound due to: Unsafe ambulation due to balance issues   Home Health   Complete by: As directed    To provide the following care/treatments:  PT OT     Increase activity slowly   Complete by: As directed      Allergies as of 03/25/2021      Reactions   Alcohol-sulfur [elemental Sulfur] Shortness Of Breath, Swelling   Alcohol po swells throat   Ergocalciferol Other (See Comments)   REACTION: GI side effects   Morphine Nausea And Vomiting   Tolterodine Tartrate Other (See Comments)   REACTION: ? reaction   Alcohol Other (See  Comments), Swelling   Myrbetriq [mirabegron] Other (See Comments)   GI Upset   Nortriptyline Hcl Other (See Comments)   unknown      Medication List    STOP taking these medications   acetaminophen 500 MG tablet Commonly known as: TYLENOL   Caltrate 600+D Plus Minerals 600-800 MG-UNIT Tabs   Fish Oil 1000 MG Caps   HYDROcodone-acetaminophen 5-325 MG tablet Commonly known as: Norco   Prevagen 10 MG Caps Generic drug: Apoaequorin   vitamin C 1000 MG tablet     TAKE these medications   albuterol 4 MG tablet Commonly known as: PROVENTIL TAKE 1 TABLET(4 MG) BY MOUTH EVERY 4 HOURS AS NEEDED What changed:   how much to take  how to take this  when to take this  reasons to take this   Align 4 MG Caps Take 4 mg by mouth daily.   amoxicillin-clavulanate 875-125 MG tablet Commonly known as: Augmentin Take 1 tablet by mouth 2 (two) times daily for 4 days.   aspirin 325 MG tablet Take 325 mg by mouth daily.   CAL-MAG-ZINC PO Take 1 tablet by mouth in the morning, at noon, and at bedtime.   diazepam 5 MG tablet Commonly known as: VALIUM TAKE 1 TABLET BY MOUTH AT BEDTIME AS NEEDED FOR ANXIETY   diltiazem 120 MG 24 hr capsule Commonly known as: CARDIZEM CD Take 1 capsule (120 mg total) by mouth daily. Start taking on: Mar 26, 2021   FIBER ADULT GUMMIES PO Take 2 tablets by mouth daily.   furosemide 40 MG tablet Commonly known as: LASIX Take 0.5 tablets (20 mg total) by mouth daily.   Synthroid 88 MCG tablet Generic drug: levothyroxine TAKE 1 TABLET(88 MCG) BY MOUTH DAILY BEFORE BREAKFAST   VITAMIN D3 GUMMIES PO Take 2 tablets by mouth daily.         Time coordinating discharge: 32 minutes  Signed:  Lataunya Ruud  [facility] 03/25/2021, 11:44 AM   Pager on [email or web address]. If 7PM-7AM, please contact night-coverage at [email or web address]

## 2021-03-27 ENCOUNTER — Telehealth: Payer: Self-pay

## 2021-03-27 LAB — CULTURE, BLOOD (ROUTINE X 2)
Culture: NO GROWTH
Culture: NO GROWTH

## 2021-03-27 NOTE — Telephone Encounter (Signed)
Per daughter Mickel Baas  Transition Care Management Follow-up Telephone Call  Date of discharge and from where: 03/25/2021 [facility]  How have you been since you were released from the hospital? Feeling better  Any questions or concerns? No  Items Reviewed:  Did the pt receive and understand the discharge instructions provided? Yes   Medications obtained and verified? Yes   Other? No   Any new allergies since your discharge? No   Dietary orders reviewed? Yes  Do you have support at home? Yes   Home Care and Equipment/Supplies: Were home health services ordered? yes If so, what is the name of the agency?   Has the agency set up a time to come to the patient's home? no Were any new equipment or medical supplies ordered?  No What is the name of the medical supply agency? n/a Were you able to get the supplies/equipment? not applicable Do you have any questions related to the use of the equipment or supplies? No  Functional Questionnaire: (I = Independent and D = Dependent) ADLs: I  Bathing/Dressing- I  Meal Prep- D  Eating- I  Maintaining continence- I  Transferring/Ambulation- I  Managing Meds- I  Follow up appointments reviewed:   PCP Hospital f/u appt confirmed? Yes  Scheduled to see Dr. Glori Bickers on 04/04/2021 @ 2:00.  Are transportation arrangements needed? No   If their condition worsens, is the pt aware to call PCP or go to the Emergency Dept.? Yes  Was the patient provided with contact information for the PCP's office or ED? Yes  Was to pt encouraged to call back with questions or concerns? Yes

## 2021-03-29 DIAGNOSIS — A419 Sepsis, unspecified organism: Secondary | ICD-10-CM | POA: Diagnosis not present

## 2021-03-29 DIAGNOSIS — K449 Diaphragmatic hernia without obstruction or gangrene: Secondary | ICD-10-CM | POA: Diagnosis not present

## 2021-03-29 DIAGNOSIS — I4891 Unspecified atrial fibrillation: Secondary | ICD-10-CM | POA: Diagnosis not present

## 2021-03-29 DIAGNOSIS — M1991 Primary osteoarthritis, unspecified site: Secondary | ICD-10-CM | POA: Diagnosis not present

## 2021-03-29 DIAGNOSIS — E538 Deficiency of other specified B group vitamins: Secondary | ICD-10-CM | POA: Diagnosis not present

## 2021-03-29 DIAGNOSIS — E559 Vitamin D deficiency, unspecified: Secondary | ICD-10-CM | POA: Diagnosis not present

## 2021-03-29 DIAGNOSIS — I059 Rheumatic mitral valve disease, unspecified: Secondary | ICD-10-CM | POA: Diagnosis not present

## 2021-03-29 DIAGNOSIS — E039 Hypothyroidism, unspecified: Secondary | ICD-10-CM | POA: Diagnosis not present

## 2021-03-29 DIAGNOSIS — K589 Irritable bowel syndrome without diarrhea: Secondary | ICD-10-CM | POA: Diagnosis not present

## 2021-03-29 DIAGNOSIS — D649 Anemia, unspecified: Secondary | ICD-10-CM | POA: Diagnosis not present

## 2021-03-29 DIAGNOSIS — F32A Depression, unspecified: Secondary | ICD-10-CM | POA: Diagnosis not present

## 2021-03-29 DIAGNOSIS — R32 Unspecified urinary incontinence: Secondary | ICD-10-CM | POA: Diagnosis not present

## 2021-03-29 DIAGNOSIS — E785 Hyperlipidemia, unspecified: Secondary | ICD-10-CM | POA: Diagnosis not present

## 2021-03-29 DIAGNOSIS — J189 Pneumonia, unspecified organism: Secondary | ICD-10-CM | POA: Diagnosis not present

## 2021-03-29 DIAGNOSIS — J45909 Unspecified asthma, uncomplicated: Secondary | ICD-10-CM | POA: Diagnosis not present

## 2021-03-29 DIAGNOSIS — L409 Psoriasis, unspecified: Secondary | ICD-10-CM | POA: Diagnosis not present

## 2021-03-30 ENCOUNTER — Telehealth: Payer: Self-pay | Admitting: *Deleted

## 2021-03-30 NOTE — Telephone Encounter (Signed)
Please ok those verbal orders  

## 2021-03-30 NOTE — Telephone Encounter (Signed)
Left VM giving Joelene Millin the verbal orders

## 2021-03-30 NOTE — Telephone Encounter (Signed)
Joelene Millin PT called for verbal orders. Pt was d/c from hospital recently and has hospital f/u on 03/27/21. Joelene Millin is asking for verbal orders for continuation of PT 2 times a week for 4 weeks, then 1 time a week for 4 weeks  [place] CB# [phone]

## 2021-04-04 ENCOUNTER — Ambulatory Visit (INDEPENDENT_AMBULATORY_CARE_PROVIDER_SITE_OTHER): Payer: Medicare Other | Admitting: Family Medicine

## 2021-04-04 ENCOUNTER — Other Ambulatory Visit: Payer: Self-pay

## 2021-04-04 ENCOUNTER — Encounter: Payer: Self-pay | Admitting: Family Medicine

## 2021-04-04 VITALS — BP 131/70 | HR 81 | Temp 97.0°F | Ht 64.0 in | Wt 152.4 lb

## 2021-04-04 DIAGNOSIS — J189 Pneumonia, unspecified organism: Secondary | ICD-10-CM | POA: Diagnosis not present

## 2021-04-04 DIAGNOSIS — S76811S Strain of other specified muscles, fascia and tendons at thigh level, right thigh, sequela: Secondary | ICD-10-CM | POA: Insufficient documentation

## 2021-04-04 DIAGNOSIS — Z8679 Personal history of other diseases of the circulatory system: Secondary | ICD-10-CM | POA: Diagnosis not present

## 2021-04-04 DIAGNOSIS — R102 Pelvic and perineal pain: Secondary | ICD-10-CM | POA: Diagnosis not present

## 2021-04-04 DIAGNOSIS — S76911S Strain of unspecified muscles, fascia and tendons at thigh level, right thigh, sequela: Secondary | ICD-10-CM | POA: Diagnosis not present

## 2021-04-04 DIAGNOSIS — E039 Hypothyroidism, unspecified: Secondary | ICD-10-CM

## 2021-04-04 DIAGNOSIS — R600 Localized edema: Secondary | ICD-10-CM

## 2021-04-04 MED ORDER — DILTIAZEM HCL ER COATED BEADS 120 MG PO CP24: 120.0000 mg | ORAL_CAPSULE | Freq: Every day | ORAL | 3 refills | Status: DC

## 2021-04-04 NOTE — Assessment & Plan Note (Addendum)
Causing pelvic pain Pt notes that Dr Helane Rima treated her for this in the office with a diazepam 10 mg vaginal suppository It helped very much and allowed her to be able to stand upright  Will call if/wen refill is needed Warned pt not to mix with oral valium she has taken in the past  Disc poss of habit and sedation

## 2021-04-04 NOTE — Assessment & Plan Note (Signed)
Recently hospitalized  Reviewed hospital records, lab results and studies in detail  Much clinical improvement with abx tx and fluids  Complicated by a spell of a fib which converted back and has not returned  Plan to continue PT at home Re check CXR in about 2 weeks to document resolution of change in R upper chest /lung process  inst to call if sob or cough return

## 2021-04-04 NOTE — Assessment & Plan Note (Signed)
This occurred while in the hospital for pna with sepsis and did resolve Echocardiogram reviewed No re occurrence  Will watch for symptoms  Pt declines cardiology input at this time but can re consider if needed  Rev s/d of a fib Reviewed hospital records, lab results and studies in detail  Echocardiogram is reassuring  Continues diltiazem with good bp, will plan to continue

## 2021-04-04 NOTE — Progress Notes (Signed)
Subjective:    Patient ID: Carla Kelly, female    DOB: Sep 26, 1933, 87 y.o.   MRN: [id number]  This visit occurred during the SARS-CoV-2 public health emergency.  Safety protocols were in place, including screening questions prior to the visit, additional usage of staff PPE, and extensive cleaning of exam room while observing appropriate contact time as indicated for disinfecting solutions.    HPI Pt presents for follow up of hospitalization for pneumonia with sepsis  Wanted to let us know of  refill of diazepam vag supp for 10 mg - from Dr Helane Rima for Iliopsoas muscle spasm (has 3 refills) -does not make her very drowsy and she does take it at bedtime No longer taking orally    Wt Readings from Last 3 Encounters:  04/04/21 152 lb 6 oz (69.1 kg)  03/25/21 153 lb 11.2 oz (69.7 kg)  11/08/20 156 lb (70.8 kg)   26.16 kg/m Pulse ox is 97% on RA today    She was hospitalized from 4/27 to 03/25/21 She presented with fever up to 102 and productive cough and congestion   Hospital course as follows: She was admitted to the hospital for sepsis secondary to pneumonia.  She was also found to have atrial fibrillation with RVR.  She was treated with IV fluids and empiric IV antibiotics.  She converted to normal sinus rhythm.  She was started on oral Cardizem because of paroxysms of rapid A. fib.  Patient and her daughter declined anticoagulation for stroke prophylaxis.  She also had hypokalemia that was treated.  She has multiple skin lesions this summer off which she has stopped 40 years.  Outpatient follow-up with dermatologist was recommended.  Her condition has improved and she is deemed stable for discharge home.  Discharge plan was discussed with the patient and her daughter (over speaker phone).  DG Chest 2 View  Result Date: 03/22/2021 CLINICAL DATA:  Suspected sepsis with fever and tachycardia in this 87 year old female. EXAM: CHEST - 2 VIEW COMPARISON:  August 29, 2016 FINDINGS:  Cardiomediastinal contours are normal. Increased interstitial markings are present at the lung bases. Linear airspace disease at the LEFT lung base. Nodular density at the RIGHT upper chest asymmetric along the first rib and sternal junction no sign of pleural effusion. Signs of hyperinflation. Signs of kyphoplasty at the L1 level. Query interval loss of height also at T12. Appearance is similar to limited fluoroscopic views from November 08, 2020. Osteopenia. IMPRESSION: 1. Increased interstitial markings at the lung bases, may be chronic and related to interstitial changes, less likely mild edema. 2. Linear airspace disease at the LEFT lung base, likely atelectasis. 3. Nodular density at the RIGHT upper chest. This may represent confluence of shadows or developing infection, nodule in this area not excluded given asymmetry. Consider follow-up PA and lateral chest after resolution of symptoms in short interval to ensure resolution. 4. Question of interval loss of height at T12 since the end of 2021. Correlate with any pain in this area. Signs of prior L1 kyphoplasty. Electronically Signed   By: Zetta Bills M.D.   On: 03/22/2021 13:35   ECHOCARDIOGRAM COMPLETE  Result Date: 03/23/2021    ECHOCARDIOGRAM REPORT   [name] Date of Exam: 03/23/2021 Medical Rec #:  [id number]    Height:       64.0 in Accession #:    [id number]   Weight:       152.9 lb Date of Birth:  1933-02-26  BSA:          1.745 m Patient Age:    55 years     BP:           140/67 mmHg Patient Gender: F            HR:           81 bpm. Exam Location:  [facility] Procedure: 2D Echo, Cardiac Doppler, Color Doppler and Strain Analysis Indications:     Atrial Fibrillation I48.91  History:         Patient has no prior history of Echocardiogram examinations.                  Stroke. Mitral valve disorder.  Sonographer:     Sherrie Sport RDCS (AE) Referring Phys:  [id number] Collier Bullock Diagnosing Phys: Isaias Cowman MD  Sonographer  Comments: Global longitudinal strain was attempted. IMPRESSIONS  1. Left ventricular ejection fraction, by estimation, is 60 to 65%. The left ventricle has normal function. The left ventricle has no regional wall motion abnormalities. Left ventricular diastolic parameters were normal.  2. Right ventricular systolic function is normal. The right ventricular size is normal.  3. The mitral valve is normal in structure. Mild to moderate mitral valve regurgitation. Mild mitral stenosis.  4. Tricuspid valve regurgitation is moderate.  5. The aortic valve is normal in structure. Aortic valve regurgitation is not visualized. No aortic stenosis is present.  6. The inferior vena cava is normal in size with greater than 50% respiratory variability, suggesting right atrial pressure of 3 mmHg. FINDINGS  Left Ventricle: Left ventricular ejection fraction, by estimation, is 60 to 65%. The left ventricle has normal function. The left ventricle has no regional wall motion abnormalities. The left ventricular internal cavity size was normal in size. There is  no left ventricular hypertrophy. Left ventricular diastolic parameters were normal. Right Ventricle: The right ventricular size is normal. No increase in right ventricular wall thickness. Right ventricular systolic function is normal. Left Atrium: Left atrial size was normal in size. Right Atrium: Right atrial size was normal in size. Pericardium: There is no evidence of pericardial effusion. Mitral Valve: The mitral valve is normal in structure. Mild to moderate mitral valve regurgitation. Mild mitral valve stenosis. MV peak gradient, 11.8 mmHg. The mean mitral valve gradient is 5.0 mmHg. Tricuspid Valve: The tricuspid valve is normal in structure. Tricuspid valve regurgitation is moderate . No evidence of tricuspid stenosis. Aortic Valve: The aortic valve is normal in structure. Aortic valve regurgitation is not visualized. No aortic stenosis is present. Aortic valve mean  gradient measures 4.0 mmHg. Aortic valve peak gradient measures 6.1 mmHg. Aortic valve area, by VTI measures 2.71 cm. Pulmonic Valve: The pulmonic valve was normal in structure. Pulmonic valve regurgitation is not visualized. No evidence of pulmonic stenosis. Aorta: The aortic root is normal in size and structure. Venous: The inferior vena cava is normal in size with greater than 50% respiratory variability, suggesting right atrial pressure of 3 mmHg. IAS/Shunts: No atrial level shunt detected by color flow Doppler.  LEFT VENTRICLE PLAX 2D LVIDd:         4.28 cm  Diastology LVIDs:         2.50 cm  LV e' medial:    7.07 cm/s LV PW:         0.99 cm  LV E/e' medial:  18.7 LV IVS:        0.71 cm  LV e' lateral:   5.44  cm/s LVOT diam:     2.00 cm  LV E/e' lateral: 24.3 LV SV:         69 LV SV Index:   39 LVOT Area:     3.14 cm                          3D Volume EF:                         3D EF:        56 %                         LV EDV:       95 ml                         LV ESV:       42 ml                         LV SV:        53 ml RIGHT VENTRICLE RV Basal diam:  3.22 cm RV S prime:     15.60 cm/s TAPSE (M-mode): 3.4 cm LEFT ATRIUM             Index       RIGHT ATRIUM           Index LA diam:        3.30 cm 1.89 cm/m  RA Area:     16.50 cm LA Vol (A2C):   74.0 ml 42.40 ml/m RA Volume:   48.60 ml  27.85 ml/m LA Vol (A4C):   38.3 ml 21.94 ml/m LA Biplane Vol: 54.2 ml 31.05 ml/m  AORTIC VALVE                   PULMONIC VALVE AV Area (Vmax):    2.40 cm    PV Vmax:        0.66 m/s AV Area (Vmean):   2.22 cm    PV Peak grad:   1.7 mmHg AV Area (VTI):     2.71 cm    RVOT Peak grad: 1 mmHg AV Vmax:           123.00 cm/s AV Vmean:          92.250 cm/s AV VTI:            0.254 m AV Peak Grad:      6.1 mmHg AV Mean Grad:      4.0 mmHg LVOT Vmax:         94.00 cm/s LVOT Vmean:        65.300 cm/s LVOT VTI:          0.219 m LVOT/AV VTI ratio: 0.86  AORTA Ao Root diam: 2.90 cm MITRAL VALVE                TRICUSPID VALVE  MV Area (PHT): 3.17 cm     TR Peak grad:   14.6 mmHg MV Area VTI:   1.51 cm     TR Vmax:        191.00 cm/s MV Peak grad:  11.8 mmHg MV Mean grad:  5.0 mmHg     SHUNTS MV Vmax:       1.72 m/s     Systemic VTI:  0.22 m MV Vmean:  102.0 cm/s   Systemic Diam: 2.00 cm MV Decel Time: 239 msec MV E velocity: 132.00 cm/s MV A velocity: 125.00 cm/s MV E/A ratio:  1.06 Isaias Cowman MD Electronically signed by Isaias Cowman MD Signature Date/Time: 03/23/2021/1:50:17 PM    Final    Findings of mild mitral stenosis  Last labs: Lab Results  Component Value Date   CREATININE 0.56 03/23/2021   BUN 16 03/23/2021   NA 135 03/23/2021   K 4.1 03/25/2021   CL 105 03/23/2021   CO2 22 03/23/2021   Lab Results  Component Value Date   ALT 19 03/22/2021   AST 30 03/22/2021   ALKPHOS 73 03/22/2021   BILITOT 1.4 (H) 03/22/2021   Lab Results  Component Value Date   WBC 7.6 03/23/2021   HGB 10.5 (L) 03/23/2021   HCT 31.6 (L) 03/23/2021   MCV 94.6 03/23/2021   PLT 182 03/23/2021   Blood cultures have been negative   Has had home care for PT  Had PT this am- it wore her out a bit (more tired and sob today but overall much improved)   BP Readings from Last 3 Encounters:  04/04/21 131/70  03/25/21 136/70  11/08/20 (!) 153/82     Pulse Readings from Last 3 Encounters:  04/04/21 81  03/25/21 85  11/08/20 77   Was told to hold fish oil and caltrate and vit C   Hypothyroid Lab Results  Component Value Date   TSH 0.452 03/22/2021   Patient Active Problem List   Diagnosis Date Noted  . Strain of iliopsoas muscle, right, sequela 04/04/2021  . CAP (community acquired pneumonia) 03/22/2021  . History of atrial fibrillation 03/22/2021  . Constipation 10/03/2020  . Closed compression fracture of L1 vertebra ([place]) 09/26/2020  . Hearing loss 07/06/2020  . Hammer toe of right foot 03/12/2018  . Screening mammogram, encounter for 03/04/2017  . Routine general medical examination at a  health care facility 02/12/2016  . Estrogen deficiency 02/15/2015  . Colon cancer screening 02/15/2015  . Arthritis of right knee 08/13/2014  . Status post total right knee replacement 08/13/2014  . Transient global amnesia 02/20/2014  . Encounter for Medicare annual wellness exam 12/07/2013  . Psoriasis 02/16/2013  . Other screening mammogram 08/19/2012  . Pedal edema 03/11/2012  . B12 deficiency 09/05/2009  . Vitamin D deficiency 11/08/2008  . Hyperlipidemia 08/25/2008  . Osteoporosis 08/25/2008  . History of colonic polyps 08/25/2008  . HERPES ZOSTER, HX OF 06/03/2008  . Hypothyroidism 08/04/2007  . HYPOGLYCEMIA 08/04/2007  . ASTHMA 08/04/2007  . IRRITABLE BOWEL SYNDROME 08/04/2007  . VAGINITIS, ATROPHIC 08/04/2007  . Osteoarthrosis, unspecified whether generalized or localized, unspecified site 08/04/2007  . FIBROMYALGIA 08/04/2007  . URINARY INCONTINENCE 08/04/2007   Past Medical History:  Diagnosis Date  . Adenomatous colon polyp   . Anemia   . Asthma   . Depression   . Disorder of bone and cartilage, unspecified   . Embolism and thrombosis of unspecified site   . Fibromyalgia   . History of herpes zoster   . History of hiatal hernia   . HLD (hyperlipidemia)   . Hypoglycemia   . Hypothyroidism   . IBS (irritable bowel syndrome)   . Mitral valve disorder    "leaking mitral valve-very mild"  . Orbital floor fracture ([place]) 10/16   with a fall   . Osteoarthritis   . Osteoarthrosis, unspecified whether generalized or localized, unspecified site   . Other B-complex deficiencies   . Psoriasis   .  Stroke ([place])   . Swelling of extremity, right    right leg  . 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 Unspecified urinary incontinence   . Unspecified vitamin D deficiency    Past Surgical History:  Procedure Laterality Date  . ABDOMINAL HYSTERECTOMY  1970   fibroids; endometriosis  . APPENDECTOMY    . bladder tack  5/05  . BREAST BIOPSY Right 1995  . BREAST CYST  ASPIRATION Left 03/17/2015  . CATARACT EXTRACTION Right 12/13   Dr George Ina  . CATARACT EXTRACTION W/PHACO Left 05/10/2015   Procedure: CATARACT EXTRACTION PHACO AND INTRAOCULAR LENS PLACEMENT (IOC);  Surgeon: Birder Robson, MD;  Location: [facility] ORS;  Service: Ophthalmology;  Laterality: Left;  [place] 00:59 AP% 26.7 CDE 15.85 fluid pack lot#[id number]  . CERVICAL DISC SURGERY     x 2  . CHOLECYSTECTOMY  2004  . COLONOSCOPY  2/02   polyps, BE-polyps  . EYE SURGERY     right eye  . JOINT REPLACEMENT    . KYPHOPLASTY N/A 11/08/2020   Procedure: L1 Kyphoplasty;  Surgeon: Hessie Knows, MD;  Location: [facility] ORS;  Service: Orthopedics;  Laterality: N/A;  . MOUTH SURGERY     lower teeth have 4 studs to keep in place  . TONSILLECTOMY    . TOTAL KNEE ARTHROPLASTY Right 08/13/2014   Procedure: RIGHT TOTAL KNEE ARTHROPLASTY;  Surgeon: Mcarthur Rossetti, MD;  Location: [facility] ORS;  Service: Orthopedics;  Laterality: Right;  . TYMPANOPLASTY    . US TRANSVAGINAL PELVIC MODIFIED  10/02   small left ovarian cyst   Social History   Tobacco Use  . Smoking status: Never Smoker  . Smokeless tobacco: Never Used  Vaping Use  . Vaping Use: Never used  Substance Use Topics  . Alcohol use: No    Comment: "is allergic"  . Drug use: No   Family History  Problem Relation Age of Onset  . Heart attack Mother   . Stroke Mother        several  . Lupus Brother   . Other Sister        benign brain tumor  . Asthma Daughter   . Breast cancer Daughter   . Other Brother        heart problems  . Colon cancer Other        1st cousin   Allergies  Allergen Reactions  . Alcohol-Sulfur [Elemental Sulfur] Shortness Of Breath and Swelling    Alcohol po swells throat  . Ergocalciferol Other (See Comments)    REACTION: GI side effects  . Morphine Nausea And Vomiting  . Tolterodine Tartrate Other (See Comments)    REACTION: ? reaction  . Alcohol Other (See Comments) and Swelling  . Myrbetriq [Mirabegron]  Other (See Comments)    GI Upset  . Nortriptyline Hcl Other (See Comments)    unknown   Current Outpatient Medications on File Prior to Visit  Medication Sig Dispense Refill  . albuterol (PROVENTIL) 4 MG tablet TAKE 1 TABLET(4 MG) BY MOUTH EVERY 4 HOURS AS NEEDED (Patient taking differently: Take 4 mg by mouth every 4 (four) hours as needed for shortness of breath or wheezing. TAKE 1 TABLET(4 MG) BY MOUTH EVERY 4 HOURS AS NEEDED) 30 tablet 5  . aspirin 325 MG tablet Take 325 mg by mouth daily.    . Calcium-Magnesium-Zinc (CAL-MAG-ZINC PO) Take 1 tablet by mouth in the morning, at noon, and at bedtime.    . Cholecalciferol (VITAMIN D3 GUMMIES PO) Take 2 tablets  by mouth daily.    . furosemide (LASIX) 40 MG tablet Take 0.5 tablets (20 mg total) by mouth daily.    . Probiotic Product (PROBIOTIC PO) Take 1 capsule by mouth daily.    Marland Kitchen SYNTHROID 88 MCG tablet TAKE 1 TABLET(88 MCG) BY MOUTH DAILY BEFORE BREAKFAST 90 tablet 2   No current facility-administered medications on file prior to visit.    Review of Systems  Constitutional: Positive for fatigue. Negative for activity change, appetite change, fever and unexpected weight change.  HENT: Negative for congestion, ear pain, rhinorrhea, sinus pressure and sore throat.   Eyes: Negative for pain, redness and visual disturbance.  Respiratory: Negative for cough, chest tightness, shortness of breath and wheezing.        Cough is improved  Cardiovascular: Negative for chest pain and palpitations.  Gastrointestinal: Negative for abdominal pain, blood in stool, constipation and diarrhea.  Endocrine: Negative for polydipsia and polyuria.  Genitourinary: Positive for pelvic pain. Negative for dysuria, frequency and urgency.  Musculoskeletal: Negative for arthralgias, back pain and myalgias.  Skin: Negative for pallor and rash.  Allergic/Immunologic: Negative for environmental allergies.  Neurological: Negative for dizziness, syncope and headaches.        Generalized weakness  Hematological: Negative for adenopathy. Does not bruise/bleed easily.  Psychiatric/Behavioral: Negative for decreased concentration and dysphoric mood. The patient is not nervous/anxious.        Objective:   Physical Exam Constitutional:      General: She is not in acute distress.    Appearance: Normal appearance. She is well-developed and normal weight.  HENT:     Head: Normocephalic and atraumatic.  Eyes:     Conjunctiva/sclera: Conjunctivae normal.     Pupils: Pupils are equal, round, and reactive to light.  Neck:     Thyroid: No thyromegaly.     Vascular: No carotid bruit or JVD.  Cardiovascular:     Rate and Rhythm: Normal rate and regular rhythm.     Pulses: Normal pulses.     Heart sounds: Normal heart sounds. No murmur heard. No gallop.   Pulmonary:     Effort: Pulmonary effort is normal. No respiratory distress.     Breath sounds: Normal breath sounds. No stridor. No wheezing, rhonchi or rales.     Comments: Good air exchange No wheeze Chest:     Chest wall: No tenderness.  Abdominal:     General: Bowel sounds are normal. There is no distension or abdominal bruit.     Palpations: Abdomen is soft. There is no mass.     Tenderness: There is no abdominal tenderness.  Musculoskeletal:     Cervical back: Normal range of motion and neck supple.  Lymphadenopathy:     Cervical: No cervical adenopathy.  Skin:    General: Skin is warm and dry.     Coloration: Skin is not pale.     Findings: No erythema or rash.  Neurological:     Mental Status: She is alert.     Coordination: Coordination normal.     Deep Tendon Reflexes: Reflexes are normal and symmetric.  Psychiatric:        Mood and Affect: Mood normal.           Assessment & Plan:   Problem List Items Addressed This Visit      Respiratory   CAP (community acquired pneumonia)    Recently hospitalized  Reviewed hospital records, lab results and studies in detail  Much clinical  improvement with abx  tx and fluids  Complicated by a spell of a fib which converted back and has not returned  Plan to continue PT at home Re check CXR in about 2 weeks to document resolution of change in R upper chest /lung process  inst to call if sob or cough return          Endocrine   Hypothyroidism    TSH was checked in the hospital in the setting of new a fib  Lab Results  Component Value Date   TSH 0.452 03/22/2021   taking levothyroxine 88 mcg daily and compliance is good  Will watch closely         Musculoskeletal and Integument   Strain of iliopsoas muscle, right, sequela    Causing pelvic pain Pt notes that Dr Helane Rima treated her for this in the office with a diazepam 10 mg vaginal suppository It helped very much and allowed her to be able to stand upright  Will call if/wen refill is needed Warned pt not to mix with oral valium she has taken in the past  Disc poss of habit and sedation         Other   Pedal edema    Taking 20 mg lasix daily  K repleted in hospital  Labs reviewed and will re check in 2 wk  Pedal edema is mild today      History of atrial fibrillation - Primary    This occurred while in the hospital for pna with sepsis and did resolve Echocardiogram reviewed No re occurrence  Will watch for symptoms  Pt declines cardiology input at this time but can re consider if needed  Rev s/d of a fib Reviewed hospital records, lab results and studies in detail  Echocardiogram is reassuring  Continues diltiazem with good bp, will plan to continue

## 2021-04-04 NOTE — Patient Instructions (Addendum)
Watch for any palpitations or shortness of breath  You are no longer in a fib but we need to watch for it  Glad you are feeling better  Stay on current medicines  Take care of yourself  Do your PT as planned   I want to re check your chest xray and cbc and chem labs in about 2 weeks   Schedule a follow up lab and xray visit

## 2021-04-04 NOTE — Assessment & Plan Note (Signed)
TSH was checked in the hospital in the setting of new a fib  Lab Results  Component Value Date   TSH 0.452 03/22/2021   taking levothyroxine 88 mcg daily and compliance is good  Will watch closely

## 2021-04-04 NOTE — Assessment & Plan Note (Signed)
Taking 20 mg lasix daily  K repleted in hospital  Labs reviewed and will re check in 2 wk  Pedal edema is mild today

## 2021-04-12 ENCOUNTER — Telehealth: Payer: Self-pay

## 2021-04-12 NOTE — Telephone Encounter (Signed)
Please ok that verbal order  

## 2021-04-12 NOTE — Telephone Encounter (Signed)
Verbal order given to Connie 

## 2021-04-12 NOTE — Telephone Encounter (Signed)
Marlowe Kays OT with [facility] left v/m requesting verbal order for [facility] OT 1 x a wk for 5 wks.

## 2021-04-18 ENCOUNTER — Other Ambulatory Visit (INDEPENDENT_AMBULATORY_CARE_PROVIDER_SITE_OTHER): Payer: Medicare Other

## 2021-04-18 ENCOUNTER — Ambulatory Visit (INDEPENDENT_AMBULATORY_CARE_PROVIDER_SITE_OTHER)
Admission: RE | Admit: 2021-04-18 | Discharge: 2021-04-18 | Disposition: A | Discharge status: Home / Self Care | Payer: Medicare Other | Source: Ambulatory Visit | Attending: Family Medicine | Admitting: Family Medicine

## 2021-04-18 ENCOUNTER — Other Ambulatory Visit: Payer: Self-pay

## 2021-04-18 DIAGNOSIS — Z8679 Personal history of other diseases of the circulatory system: Secondary | ICD-10-CM

## 2021-04-18 DIAGNOSIS — J9811 Atelectasis: Secondary | ICD-10-CM | POA: Diagnosis not present

## 2021-04-18 DIAGNOSIS — E039 Hypothyroidism, unspecified: Secondary | ICD-10-CM

## 2021-04-18 DIAGNOSIS — J189 Pneumonia, unspecified organism: Secondary | ICD-10-CM

## 2021-04-18 DIAGNOSIS — R600 Localized edema: Secondary | ICD-10-CM

## 2021-04-19 LAB — CBC WITH DIFFERENTIAL/PLATELET
Basophils Absolute: 0.1 10*3/uL (ref 0.0–0.1)
Basophils Relative: 1.1 % (ref 0.0–3.0)
Eosinophils Absolute: 0.1 10*3/uL (ref 0.0–0.7)
Eosinophils Relative: 2.6 % (ref 0.0–5.0)
HCT: 37.7 % (ref 36.0–46.0)
Hemoglobin: 12.5 g/dL (ref 12.0–15.0)
Lymphocytes Relative: 48.7 % — ABNORMAL HIGH (ref 12.0–46.0)
Lymphs Abs: 2.7 10*3/uL (ref 0.7–4.0)
MCHC: 33.1 g/dL (ref 30.0–36.0)
MCV: 94.5 fl (ref 78.0–100.0)
Monocytes Absolute: 0.3 10*3/uL (ref 0.1–1.0)
Monocytes Relative: 5.6 % (ref 3.0–12.0)
Neutro Abs: 2.3 10*3/uL (ref 1.4–7.7)
Neutrophils Relative %: 42 % — ABNORMAL LOW (ref 43.0–77.0)
Platelets: 154 10*3/uL (ref 150.0–400.0)
RBC: 3.99 Mil/uL (ref 3.87–5.11)
RDW: 13.9 % (ref 11.5–15.5)
WBC: 5.6 10*3/uL (ref 4.0–10.5)

## 2021-04-19 LAB — BASIC METABOLIC PANEL
BUN: 22 mg/dL (ref 6–23)
CO2: 29 mEq/L (ref 19–32)
Calcium: 9.6 mg/dL (ref 8.4–10.5)
Chloride: 106 mEq/L (ref 96–112)
Creatinine, Ser: 0.76 mg/dL (ref 0.40–1.20)
GFR: 71.03 mL/min (ref >60.00)
Glucose, Bld: 96 mg/dL (ref 70–99)
Potassium: 4.5 mEq/L (ref 3.5–5.1)
Sodium: 142 mEq/L (ref 135–145)

## 2021-04-19 LAB — TSH: TSH: 0.84 u[IU]/mL (ref 0.35–4.50)

## 2021-04-20 ENCOUNTER — Encounter: Payer: Self-pay | Admitting: Family Medicine

## 2021-04-20 DIAGNOSIS — I700 Atherosclerosis of aorta: Secondary | ICD-10-CM | POA: Insufficient documentation

## 2021-04-28 DIAGNOSIS — E559 Vitamin D deficiency, unspecified: Secondary | ICD-10-CM | POA: Diagnosis not present

## 2021-04-28 DIAGNOSIS — M1991 Primary osteoarthritis, unspecified site: Secondary | ICD-10-CM | POA: Diagnosis not present

## 2021-04-28 DIAGNOSIS — E785 Hyperlipidemia, unspecified: Secondary | ICD-10-CM | POA: Diagnosis not present

## 2021-04-28 DIAGNOSIS — R32 Unspecified urinary incontinence: Secondary | ICD-10-CM | POA: Diagnosis not present

## 2021-04-28 DIAGNOSIS — E538 Deficiency of other specified B group vitamins: Secondary | ICD-10-CM | POA: Diagnosis not present

## 2021-04-28 DIAGNOSIS — K589 Irritable bowel syndrome without diarrhea: Secondary | ICD-10-CM | POA: Diagnosis not present

## 2021-04-28 DIAGNOSIS — I059 Rheumatic mitral valve disease, unspecified: Secondary | ICD-10-CM | POA: Diagnosis not present

## 2021-04-28 DIAGNOSIS — E039 Hypothyroidism, unspecified: Secondary | ICD-10-CM | POA: Diagnosis not present

## 2021-04-28 DIAGNOSIS — J45909 Unspecified asthma, uncomplicated: Secondary | ICD-10-CM | POA: Diagnosis not present

## 2021-04-28 DIAGNOSIS — I4891 Unspecified atrial fibrillation: Secondary | ICD-10-CM | POA: Diagnosis not present

## 2021-04-28 DIAGNOSIS — F32A Depression, unspecified: Secondary | ICD-10-CM | POA: Diagnosis not present

## 2021-04-28 DIAGNOSIS — A419 Sepsis, unspecified organism: Secondary | ICD-10-CM | POA: Diagnosis not present

## 2021-04-28 DIAGNOSIS — D649 Anemia, unspecified: Secondary | ICD-10-CM | POA: Diagnosis not present

## 2021-04-28 DIAGNOSIS — L409 Psoriasis, unspecified: Secondary | ICD-10-CM | POA: Diagnosis not present

## 2021-04-28 DIAGNOSIS — K449 Diaphragmatic hernia without obstruction or gangrene: Secondary | ICD-10-CM | POA: Diagnosis not present

## 2021-04-28 DIAGNOSIS — J189 Pneumonia, unspecified organism: Secondary | ICD-10-CM | POA: Diagnosis not present

## 2021-05-01 DIAGNOSIS — A419 Sepsis, unspecified organism: Secondary | ICD-10-CM | POA: Diagnosis not present

## 2021-05-01 DIAGNOSIS — D649 Anemia, unspecified: Secondary | ICD-10-CM | POA: Diagnosis not present

## 2021-05-01 DIAGNOSIS — M1991 Primary osteoarthritis, unspecified site: Secondary | ICD-10-CM

## 2021-05-01 DIAGNOSIS — Z8781 Personal history of (healed) traumatic fracture: Secondary | ICD-10-CM

## 2021-05-01 DIAGNOSIS — R32 Unspecified urinary incontinence: Secondary | ICD-10-CM

## 2021-05-01 DIAGNOSIS — E559 Vitamin D deficiency, unspecified: Secondary | ICD-10-CM

## 2021-05-01 DIAGNOSIS — I059 Rheumatic mitral valve disease, unspecified: Secondary | ICD-10-CM

## 2021-05-01 DIAGNOSIS — J45909 Unspecified asthma, uncomplicated: Secondary | ICD-10-CM

## 2021-05-01 DIAGNOSIS — E039 Hypothyroidism, unspecified: Secondary | ICD-10-CM

## 2021-05-01 DIAGNOSIS — Z9181 History of falling: Secondary | ICD-10-CM

## 2021-05-01 DIAGNOSIS — F32A Depression, unspecified: Secondary | ICD-10-CM

## 2021-05-01 DIAGNOSIS — E538 Deficiency of other specified B group vitamins: Secondary | ICD-10-CM

## 2021-05-01 DIAGNOSIS — K589 Irritable bowel syndrome without diarrhea: Secondary | ICD-10-CM

## 2021-05-01 DIAGNOSIS — Z8673 Personal history of transient ischemic attack (TIA), and cerebral infarction without residual deficits: Secondary | ICD-10-CM

## 2021-05-01 DIAGNOSIS — L409 Psoriasis, unspecified: Secondary | ICD-10-CM

## 2021-05-01 DIAGNOSIS — M797 Fibromyalgia: Secondary | ICD-10-CM

## 2021-05-01 DIAGNOSIS — I4891 Unspecified atrial fibrillation: Secondary | ICD-10-CM | POA: Diagnosis not present

## 2021-05-01 DIAGNOSIS — K449 Diaphragmatic hernia without obstruction or gangrene: Secondary | ICD-10-CM

## 2021-05-01 DIAGNOSIS — E785 Hyperlipidemia, unspecified: Secondary | ICD-10-CM

## 2021-05-01 DIAGNOSIS — Z96651 Presence of right artificial knee joint: Secondary | ICD-10-CM

## 2021-05-01 DIAGNOSIS — J189 Pneumonia, unspecified organism: Secondary | ICD-10-CM | POA: Diagnosis not present

## 2021-05-05 ENCOUNTER — Ambulatory Visit
Admission: RE | Admit: 2021-05-05 | Discharge: 2021-05-05 | Disposition: A | Discharge status: Home / Self Care | Payer: Medicare Other | Source: Ambulatory Visit | Attending: Family Medicine | Admitting: Family Medicine

## 2021-05-05 ENCOUNTER — Other Ambulatory Visit: Payer: Self-pay

## 2021-05-05 DIAGNOSIS — Z1231 Encounter for screening mammogram for malignant neoplasm of breast: Secondary | ICD-10-CM

## 2021-05-05 DIAGNOSIS — M810 Age-related osteoporosis without current pathological fracture: Secondary | ICD-10-CM | POA: Diagnosis not present

## 2021-05-05 DIAGNOSIS — S32010S Wedge compression fracture of first lumbar vertebra, sequela: Secondary | ICD-10-CM

## 2021-05-05 DIAGNOSIS — E2839 Other primary ovarian failure: Secondary | ICD-10-CM

## 2021-05-15 ENCOUNTER — Other Ambulatory Visit: Payer: Self-pay | Admitting: Family Medicine

## 2021-06-29 ENCOUNTER — Encounter: Payer: Self-pay | Admitting: Family Medicine

## 2021-07-04 IMAGING — MG DIGITAL SCREENING BILAT W/ CAD
4 series · 4 of 4 positions shown · non-contrast
Comparison: Previous exam(s).

CLINICAL DATA: Screening.

EXAM:
DIGITAL SCREENING BILATERAL MAMMOGRAM WITH CAD
TECHNIQUE: Bilateral screening digital craniocaudal and mediolateral oblique
mammograms were obtained. The images were evaluated with
computer-aided detection.

[L MLO]
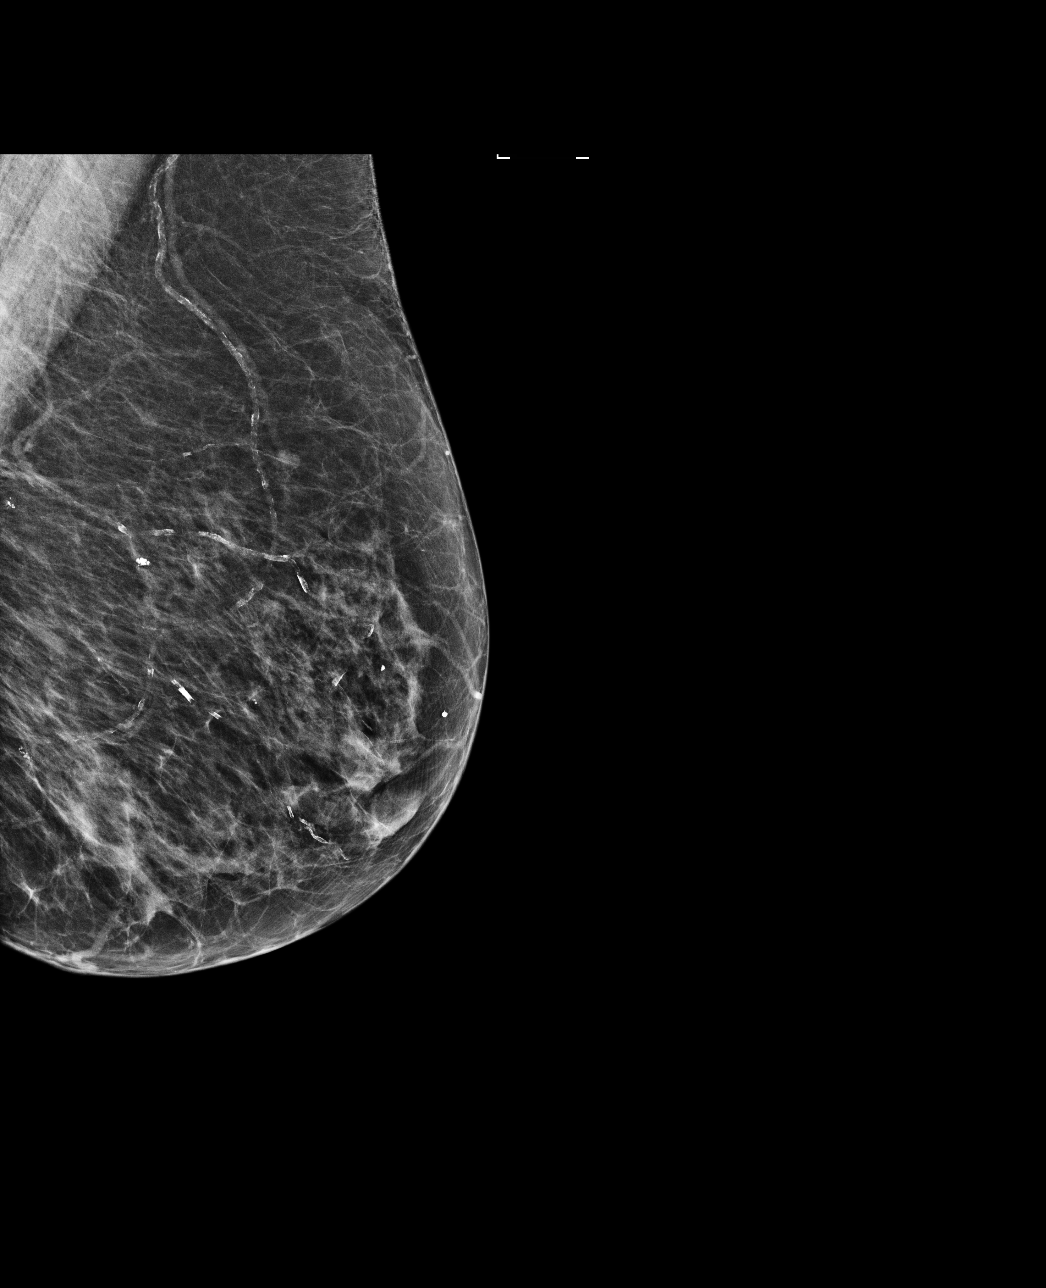

[R CC]
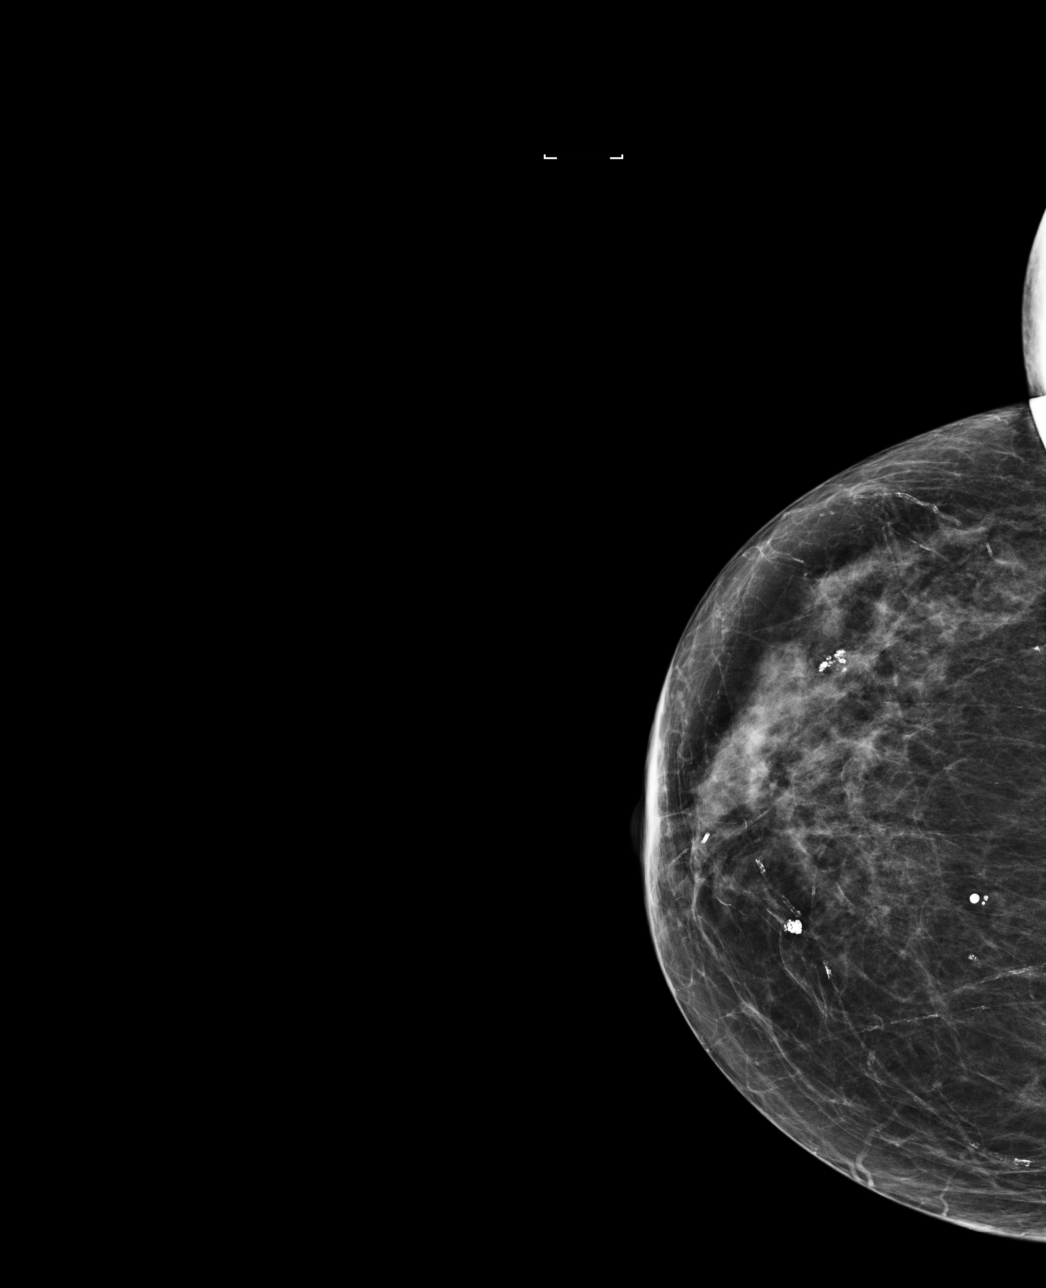

[R MLO]
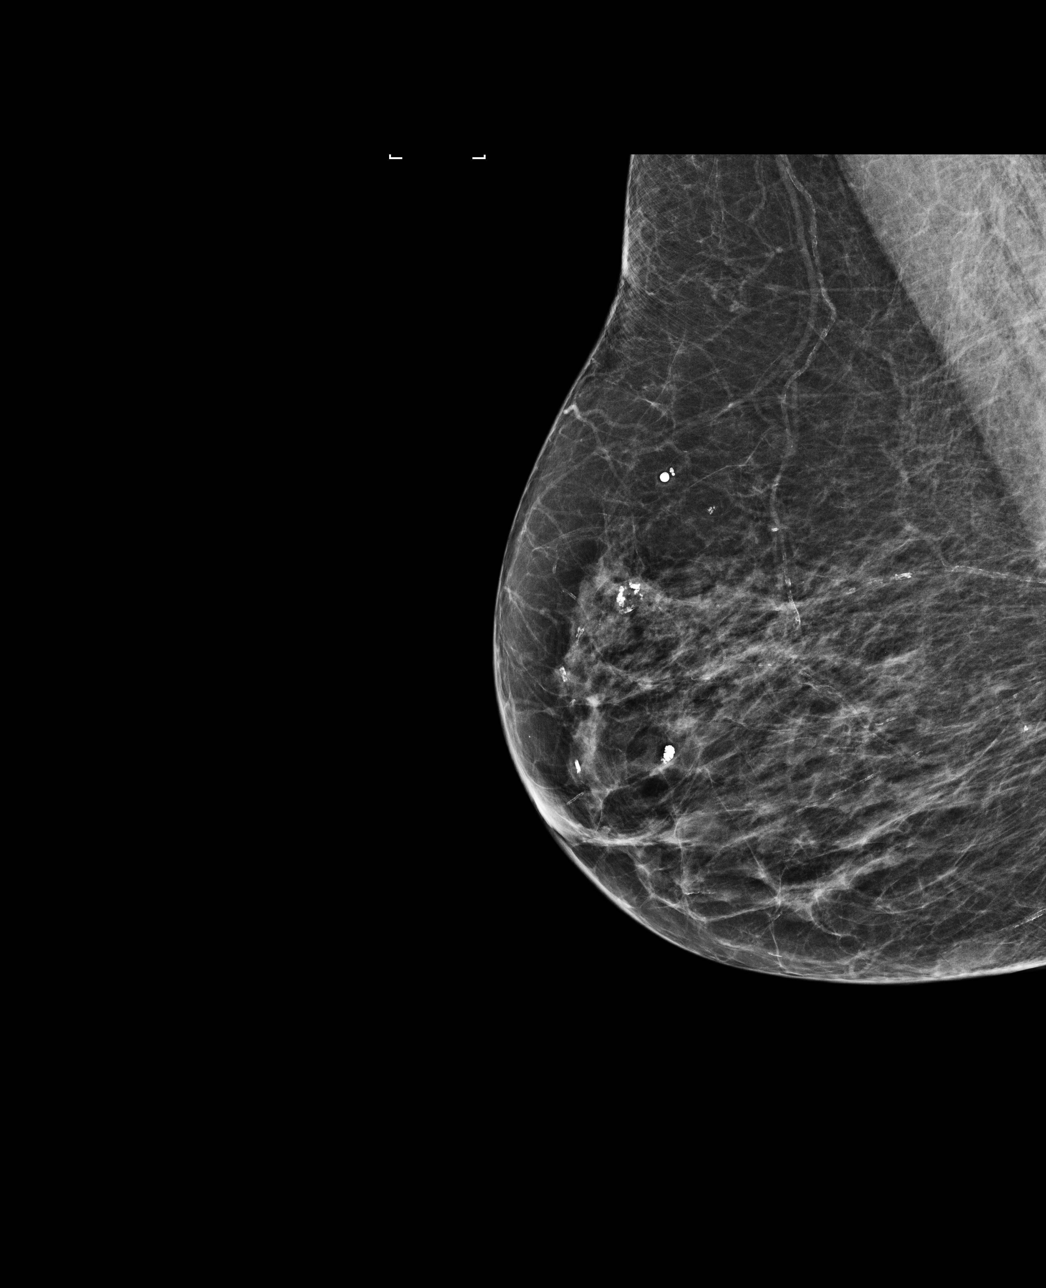

[L CC]
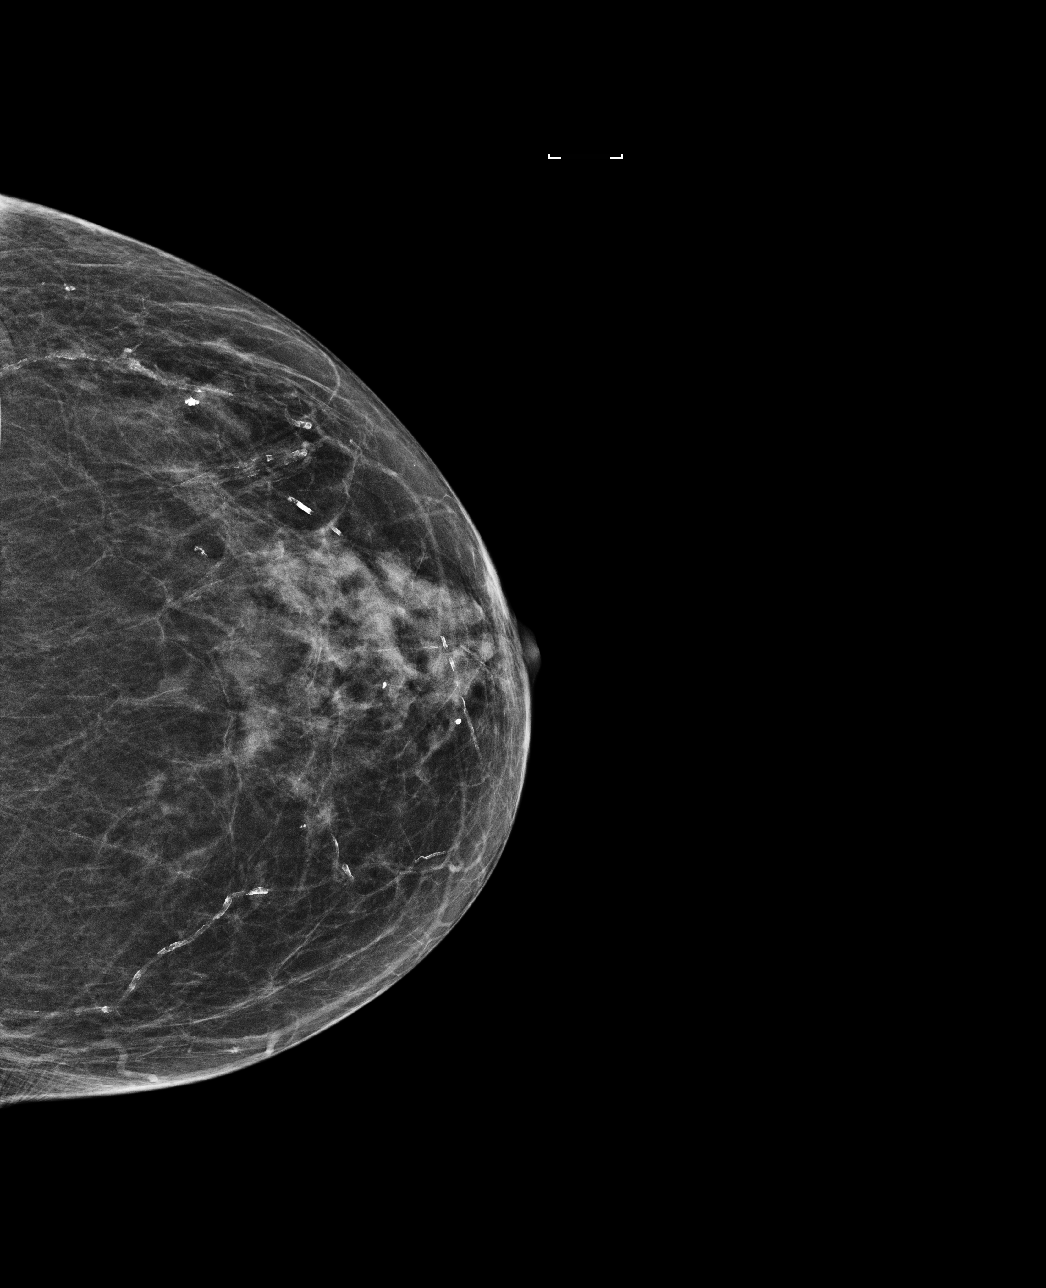

[4 of 4 positions shown; findings below may reference images not displayed]

ACR Breast Density Category c: The breast tissue is heterogeneously
dense, which may obscure small masses.
FINDINGS: There are no findings suspicious for malignancy. The images were
evaluated with computer-aided detection.
IMPRESSION: No mammographic evidence of malignancy. A result letter of this
screening mammogram will be mailed directly to the patient.

RECOMMENDATION:
Screening mammogram in one year. (Code:[id number]-W-Z4A)

BI-RADS CATEGORY  1: Negative.

## 2021-07-10 ENCOUNTER — Other Ambulatory Visit: Payer: Self-pay

## 2021-07-10 ENCOUNTER — Ambulatory Visit (INDEPENDENT_AMBULATORY_CARE_PROVIDER_SITE_OTHER): Payer: Medicare Other | Admitting: Family Medicine

## 2021-07-10 ENCOUNTER — Encounter: Payer: Self-pay | Admitting: Family Medicine

## 2021-07-10 VITALS — BP 138/70 | HR 76 | Temp 98.3°F | Ht 64.0 in | Wt 151.5 lb

## 2021-07-10 DIAGNOSIS — E538 Deficiency of other specified B group vitamins: Secondary | ICD-10-CM

## 2021-07-10 DIAGNOSIS — I700 Atherosclerosis of aorta: Secondary | ICD-10-CM | POA: Diagnosis not present

## 2021-07-10 DIAGNOSIS — E7800 Pure hypercholesterolemia, unspecified: Secondary | ICD-10-CM

## 2021-07-10 DIAGNOSIS — E559 Vitamin D deficiency, unspecified: Secondary | ICD-10-CM

## 2021-07-10 DIAGNOSIS — M810 Age-related osteoporosis without current pathological fracture: Secondary | ICD-10-CM

## 2021-07-10 DIAGNOSIS — E039 Hypothyroidism, unspecified: Secondary | ICD-10-CM

## 2021-07-10 DIAGNOSIS — H9192 Unspecified hearing loss, left ear: Secondary | ICD-10-CM | POA: Diagnosis not present

## 2021-07-10 DIAGNOSIS — Z0000 Encounter for general adult medical examination without abnormal findings: Secondary | ICD-10-CM | POA: Diagnosis not present

## 2021-07-10 NOTE — Assessment & Plan Note (Signed)
B12 level today Oral supplementation  Feels good

## 2021-07-10 NOTE — Progress Notes (Signed)
Subjective:    Patient ID: Carla Kelly, female    DOB: June 08, 1933, 87 y.o.   MRN: [id number]  This visit occurred during the SARS-CoV-2 public health emergency.  Safety protocols were in place, including screening questions prior to the visit, additional usage of staff PPE, and extensive cleaning of exam room while observing appropriate contact time as indicated for disinfecting solutions.   HPI Pt presents for amw and health mt exam with review of chronic health problems   I have personally reviewed the Medicare Annual Wellness questionnaire and have noted 1. The patient's medical and social history 2. Their use of alcohol, tobacco or illicit drugs 3. Their current medications and supplements 4. The patient's functional ability including ADL's, fall risks, home safety risks and hearing or visual             impairment. 5. Diet and physical activities 6. Evidence for depression or mood disorders  The patients weight, height, BMI have been recorded in the chart and visual acuity is per eye clinic.  I have made referrals, counseling and provided education to the patient based review of the above and I have provided the pt with a written personalized care plan for preventive services. Reviewed and updated provider list, see scanned forms.  See scanned forms.   -1/14 Td Mammogram 6/22 No lumps on self breast exam  Pneumovax-completed Zoster vaccine- not interested now  Covid -vaccinated Dexa-OP, mixed trend 6/22 Given info on medication /alendronate (afraid it will cause inc in fluid retention) Falls-had one with fracture  Fractures- L1 compression fracture in the past year with kyphoplasty  D level has been tx (66.4) Supplements- taking vit D /gummies  Exercise - gets around the house   Advance directive- has up to date  Cognitive function addressed- see scanned forms- and if abnormal then additional documentation follows.   Very good memory and cognition  Able to handle own  affairs  Took prevagen   PMH and SH reviewed  Meds, vitals, and allergies reviewed.   ROS: See HPI.  Otherwise negative.    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  Saw ENT Did not think anything could be done about that   Care team Lalisa Kiehn-pcp Porfilio-oph Menz-ortho  BP Readings from Last 3 Encounters:  07/10/21 138/70  04/04/21 131/70  03/25/21 136/70    Pulse Readings from Last 3 Encounters:  07/10/21 76  04/04/21 81  03/25/21 85    Hypothyroidism  Pt has no clinical changes No change in energy level/ hair or skin/ edema and no tremor Lab Results  Component Value Date   TSH 0.84 04/18/2021    Levothyroxine 88 mcg daily  Is loosing weight  Feels cold  Hair falling out    Vit B12 def Lab Results  Component Value Date   VITAMINB12 356 06/29/2020   Oral supplementation   Hyperlipidemia Lab Results  Component Value Date   CHOL 219 (H) 06/29/2020  HDL 67.50 06/29/2020   LDLCALC 138 (H) 06/29/2020   LDLDIRECT 149.5 12/08/2013   TRIG 64.0 06/29/2020   CHOLHDL 3 06/29/2020   Diet controlled  Eating a more balanced diet  Takes fish oil  Less eggs Some coffee creamer -palm oil   Patient Active Problem List   Diagnosis Date Noted   Aortic atherosclerosis ([place]) 04/20/2021   Strain of iliopsoas muscle, right, sequela 04/04/2021   History of atrial fibrillation 03/22/2021   Constipation 10/03/2020   Closed compression fracture of L1 vertebra ([place]) 09/26/2020   Hearing loss 07/06/2020   Hammer toe of right foot 03/12/2018   Screening  mammogram, encounter for 03/04/2017   Routine general medical examination at a health care facility 02/12/2016   Estrogen deficiency 02/15/2015   Colon cancer screening 02/15/2015   Arthritis of right knee 08/13/2014   Status post total right knee replacement 08/13/2014   Transient global amnesia 02/20/2014   Encounter for Medicare annual wellness exam 12/07/2013   Psoriasis 02/16/2013   Pedal edema 03/11/2012   B12 deficiency 09/05/2009   Vitamin D deficiency 11/08/2008   Hyperlipidemia 08/25/2008   Osteoporosis 08/25/2008   History of colonic polyps 08/25/2008   HERPES ZOSTER, HX OF 06/03/2008   Hypothyroidism 08/04/2007   HYPOGLYCEMIA 08/04/2007   ASTHMA 08/04/2007   IRRITABLE BOWEL SYNDROME 08/04/2007   VAGINITIS, ATROPHIC 08/04/2007   Osteoarthrosis, unspecified whether generalized or localized, unspecified site 08/04/2007   FIBROMYALGIA 08/04/2007   URINARY INCONTINENCE 08/04/2007   Past Medical History:  Diagnosis Date   Adenomatous colon polyp    Anemia    Asthma    Depression    Disorder of bone and cartilage, unspecified    Embolism and thrombosis of unspecified site    Fibromyalgia    History of herpes zoster    History of hiatal hernia    HLD (hyperlipidemia)    Hypoglycemia    Hypothyroidism    IBS (irritable bowel syndrome)    Mitral valve disorder    "leaking mitral valve-very mild"   Orbital floor fracture ([place]) 10/16   with a fall    Osteoarthritis    Osteoarthrosis, unspecified whether generalized or localized, unspecified site    Other B-complex deficiencies    Psoriasis    Stroke ([place])    Swelling of extremity, right    right leg   TGA (transient global amnesia) 01/2014   Unspecified urinary incontinence    Unspecified vitamin D deficiency    Past Surgical History:  Procedure Laterality Date   ABDOMINAL HYSTERECTOMY  1970   fibroids; endometriosis   APPENDECTOMY     bladder tack  5/05   BREAST BIOPSY Right 1995   BREAST CYST ASPIRATION  Left 03/17/2015   CATARACT EXTRACTION Right 12/13   Dr George Ina   CATARACT EXTRACTION W/PHACO Left 05/10/2015   Procedure: CATARACT EXTRACTION PHACO AND INTRAOCULAR LENS PLACEMENT ([place]);  Surgeon: Birder Robson, MD;  Location: [facility];  Service: Ophthalmology;  Laterality: Left;  [place] 00:59 AP% 26.7 CDE 15.85 fluid pack lot#[id number]   CERVICAL DISC SURGERY     x 2   CHOLECYSTECTOMY  2004   COLONOSCOPY  2/02   polyps, BE-polyps   EYE SURGERY     right eye   JOINT REPLACEMENT     KYPHOPLASTY N/A 11/08/2020   Procedure: L1 Kyphoplasty;  Surgeon: Hessie Knows, MD;  Location: [facility];  Service: Orthopedics;  Laterality: N/A;   MOUTH SURGERY     lower teeth have 4 studs to keep in  place   TONSILLECTOMY     TOTAL KNEE ARTHROPLASTY Right 08/13/2014   Procedure: RIGHT TOTAL KNEE ARTHROPLASTY;  Surgeon: Mcarthur Rossetti, MD;  Location: [facility];  Service: Orthopedics;  Laterality: Right;   TYMPANOPLASTY     US TRANSVAGINAL PELVIC MODIFIED  10/02   small left ovarian cyst   Social History   Tobacco Use   Smoking status: Never   Smokeless tobacco: Never  Vaping Use   Vaping Use: Never used  Substance Use Topics   Alcohol use: No    Comment: "is allergic"   Drug use: No   Family History  Problem Relation Age of Onset   Heart attack Mother    Stroke Mother        several   Lupus Brother    Other Sister        benign brain tumor   Asthma Daughter    Breast cancer Daughter    Other Brother        heart problems   Colon cancer Other        1st cousin   Allergies  Allergen Reactions   Alcohol-Sulfur [Elemental Sulfur] Shortness Of Breath and Swelling    Alcohol po swells throat   Ergocalciferol Other (See Comments)    REACTION: GI side effects   Morphine Nausea And Vomiting   Tolterodine Tartrate Other (See Comments)    REACTION: ? reaction   Alcohol Other (See Comments) and Swelling   Myrbetriq [Mirabegron] Other (See Comments)    GI Upset   Nortriptyline Hcl  Other (See Comments)    unknown   Current Outpatient Medications on File Prior to Visit  Medication Sig Dispense Refill   albuterol (PROVENTIL) 4 MG tablet TAKE 1 TABLET(4 MG) BY MOUTH EVERY 4 HOURS AS NEEDED (Patient taking differently: Take 4 mg by mouth every 4 (four) hours as needed for shortness of breath or wheezing. TAKE 1 TABLET(4 MG) BY MOUTH EVERY 4 HOURS AS NEEDED) 30 tablet 5   aspirin 325 MG tablet Take 325 mg by mouth daily.     Calcium-Magnesium-Zinc (CAL-MAG-ZINC PO) Take 1 tablet by mouth in the morning, at noon, and at bedtime.     Cholecalciferol (VITAMIN D3 GUMMIES PO) Take 2 tablets by mouth daily.     diazepam (VALIUM) 10 MG tablet Take 10 mg by mouth every 6 (six) hours as needed for anxiety.     diltiazem (CARDIZEM CD) 120 MG 24 hr capsule Take 1 capsule (120 mg total) by mouth daily. 90 capsule 3   furosemide (LASIX) 40 MG tablet TAKE 1/2 TABLET(20 MG) BY MOUTH TWICE DAILY 45 tablet 1   Probiotic Product (PROBIOTIC PO) Take 1 capsule by mouth daily.     SYNTHROID 88 MCG tablet TAKE 1 TABLET(88 MCG) BY MOUTH DAILY BEFORE BREAKFAST 90 tablet 2   No current facility-administered medications on file prior to visit.    Review of Systems  Constitutional:  Negative for activity change, appetite change, fatigue, fever and unexpected weight change.       Has lost a little weight lately  HENT:  Positive for hearing loss. Negative for congestion, ear pain, rhinorrhea, sinus pressure and sore throat.   Eyes:  Negative for pain, redness and visual disturbance.  Respiratory:  Negative for cough, shortness of breath and wheezing.   Cardiovascular:  Negative for chest pain and palpitations.  Gastrointestinal:  Negative for abdominal pain, blood in stool, constipation and diarrhea.  Endocrine: Positive for cold intolerance. Negative for polydipsia  and polyuria.  Genitourinary:  Negative for dysuria, frequency and urgency.  Musculoskeletal:  Positive for arthralgias and back pain.  Negative for myalgias.  Skin:  Negative for pallor and rash.       Some hair loss  Allergic/Immunologic: Negative for environmental allergies.  Neurological:  Negative for dizziness, syncope and headaches.  Hematological:  Negative for adenopathy. Does not bruise/bleed easily.  Psychiatric/Behavioral:  Negative for decreased concentration and dysphoric mood. The patient is not nervous/anxious.       Objective:   Physical Exam Constitutional:      General: She is not in acute distress.    Appearance: Normal appearance. She is well-developed and normal weight. She is not ill-appearing or diaphoretic.  HENT:     Head: Normocephalic and atraumatic.     Right Ear: Tympanic membrane, ear canal and external ear normal.     Left Ear: Tympanic membrane, ear canal and external ear normal.     Nose: Nose normal. No congestion.     Mouth/Throat:     Mouth: Mucous membranes are moist.     Pharynx: Oropharynx is clear. No posterior oropharyngeal erythema.  Eyes:     General: No scleral icterus.    Extraocular Movements: Extraocular movements intact.     Conjunctiva/sclera: Conjunctivae normal.     Pupils: Pupils are equal, round, and reactive to light.  Neck:     Thyroid: No thyromegaly.     Vascular: No carotid bruit or JVD.  Cardiovascular:     Rate and Rhythm: Normal rate and regular rhythm.     Pulses: Normal pulses.     Heart sounds: Normal heart sounds.    No gallop.  Pulmonary:     Effort: Pulmonary effort is normal. No respiratory distress.     Breath sounds: Normal breath sounds. No wheezing.     Comments: Good air exch Chest:     Chest wall: No tenderness.  Abdominal:     General: Bowel sounds are normal. There is no distension or abdominal bruit.     Palpations: Abdomen is soft. There is no mass.     Tenderness: There is no abdominal tenderness.     Hernia: No hernia is present.  Genitourinary:    Comments: Pt declines breast exam today   Musculoskeletal:         General: No tenderness. Normal range of motion.     Cervical back: Normal range of motion and neck supple. No rigidity. No muscular tenderness.     Right lower leg: No edema.     Left lower leg: No edema.     Comments: Mild kyphosis   Lymphadenopathy:     Cervical: No cervical adenopathy.  Skin:    General: Skin is warm and dry.     Coloration: Skin is not pale.     Findings: No erythema or rash.     Comments: Solar lentigines diffusely Sks diffusely  Neurological:     Mental Status: She is alert. Mental status is at baseline.     Cranial Nerves: No cranial nerve deficit.     Motor: No abnormal muscle tone.     Coordination: Coordination normal.     Gait: Gait normal.     Deep Tendon Reflexes: Reflexes are normal and symmetric. Reflexes normal.  Psychiatric:        Mood and Affect: Mood normal.        Cognition and Memory: Cognition and memory normal.     Comments: Very mentally sharp  Assessment & Plan:   Problem List Items Addressed This Visit       Cardiovascular and Mediastinum   Aortic atherosclerosis ([place])    bp is stable  Cholesterol labs today  No clinical changes          Endocrine   Hypothyroidism    Hypothyroidism  Pt has no clinical changes No change in energy level/ hair or skin/ edema and no tremor Lab Results  Component Value Date   TSH 0.84 04/18/2021    Re checking today  Taking levothyr 88 mcg daily  Notes some wt loss but also cold intol and hair loss Nothing new on exam      Relevant Orders   TSH     Nervous and Auditory   Hearing loss    Pt did see ENT for L ear (hole in TM-unable to fix)  Pt does not think this is treatable  Vision is good in the R ear         Musculoskeletal and Integument   Osteoporosis    Reviewed dexa 6/22 with mixed trend Also L1 compression fracture (had kyphoplasty) and doing well  Declines medication for OP at this time (did investigate alendronate and afraid of side eff Discussed pros  and cons Vit d level drawn -continues D3 gummies        Other   B12 deficiency    B12 level today Oral supplementation  Feels good       Relevant Orders   Vitamin B12   Vitamin D deficiency    Level today Disc imp of D to bone and overall health  Taking gummies D3 otc      Relevant Orders   VITAMIN D 25 Hydroxy (Vit-D Deficiency, Fractures)   Hyperlipidemia    Disc goals for lipids and reasons to control them Rev last labs with pt Rev low sat fat diet in detail Labs today  Taking fish oil Eating less eggs/eating better in general living with family        Relevant Orders   Lipid panel   Encounter for Medicare annual wellness exam - Primary    Reviewed health habits including diet and exercise and skin cancer prevention Reviewed appropriate screening tests for age  Also reviewed health mt list, fam hx and immunization status , as well as social and family history   See HPI Labs reviewed  Mammogram up to date  Declines shingrix  Declines tx for OP except vitD  (dexa 6/22 reviewed) No falls or fractures  Advance directive is up to date  No cognitive concerns (family agrees)  Baseline hearing loss L ear (has seen specialist) utd eye/vision care        Routine general medical examination at a health care facility    Reviewed health habits including diet and exercise and skin cancer prevention Reviewed appropriate screening tests for age  Also reviewed health mt list, fam hx and immunization status , as well as social and family history   See HPI Labs reviewed from may, labs also ordered Mammogram up to date  Declines shingrix  Declines tx for OP except vitD  (dexa 6/22 reviewed) No falls or fractures  Advance directive is up to date  No cognitive concerns (family agrees)  Baseline hearing loss L ear (has seen specialist) utd eye/vision care       Relevant Orders   CBC with Differential/Platelet   Comprehensive metabolic panel

## 2021-07-10 NOTE — Assessment & Plan Note (Signed)
Pt did see ENT for L ear (hole in TM-unable to fix)  Pt does not think this is treatable  Vision is good in the R ear

## 2021-07-10 NOTE — Assessment & Plan Note (Signed)
Reviewed health habits including diet and exercise and skin cancer prevention Reviewed appropriate screening tests for age  Also reviewed health mt list, fam hx and immunization status , as well as social and family history   See HPI Labs reviewed  Mammogram up to date  Declines shingrix  Declines tx for OP except vitD  (dexa 6/22 reviewed) No falls or fractures  Advance directive is up to date  No cognitive concerns (family agrees)  Baseline hearing loss L ear (has seen specialist) utd eye/vision care

## 2021-07-10 NOTE — Assessment & Plan Note (Signed)
Level today Disc imp of D to bone and overall health  Taking gummies D3 otc

## 2021-07-10 NOTE — Assessment & Plan Note (Signed)
Disc goals for lipids and reasons to control them Rev last labs with pt Rev low sat fat diet in detail Labs today  Taking fish oil Eating less eggs/eating better in general living with family

## 2021-07-10 NOTE — Assessment & Plan Note (Addendum)
Reviewed health habits including diet and exercise and skin cancer prevention Reviewed appropriate screening tests for age  Also reviewed health mt list, fam hx and immunization status , as well as social and family history   See HPI Labs reviewed from may, labs also ordered Mammogram up to date  Declines shingrix  Declines tx for OP except vitD  (dexa 6/22 reviewed) No falls or fractures  Advance directive is up to date  No cognitive concerns (family agrees)  Baseline hearing loss L ear (has seen specialist) utd eye/vision care

## 2021-07-10 NOTE — Assessment & Plan Note (Signed)
Reviewed dexa 6/22 with mixed trend Also L1 compression fracture (had kyphoplasty) and doing well  Declines medication for OP at this time (did investigate alendronate and afraid of side eff Discussed pros and cons Vit d level drawn -continues D3 gummies

## 2021-07-10 NOTE — Assessment & Plan Note (Signed)
bp is stable  Cholesterol labs today  No clinical changes

## 2021-07-10 NOTE — Assessment & Plan Note (Signed)
Hypothyroidism  Pt has no clinical changes No change in energy level/ hair or skin/ edema and no tremor Lab Results  Component Value Date   TSH 0.84 04/18/2021    Re checking today  Taking levothyr 88 mcg daily  Notes some wt loss but also cold intol and hair loss Nothing new on exam

## 2021-07-10 NOTE — Patient Instructions (Addendum)
If you are interested in the new shingles vaccine (Shingrix) - call your local pharmacy to check on coverage and availability  If affordable, get on a wait list at your pharmacy to get the vaccine.   If you want to try something for osteoporosis in the future, let me know   Stay active, mind and body   Labs today

## 2021-07-11 LAB — COMPREHENSIVE METABOLIC PANEL
ALT: 13 U/L (ref 0–35)
AST: 18 U/L (ref 0–37)
Albumin: 4.2 g/dL (ref 3.5–5.2)
Alkaline Phosphatase: 59 U/L (ref 39–117)
BUN: 30 mg/dL — ABNORMAL HIGH (ref 6–23)
CO2: 30 mEq/L (ref 19–32)
Calcium: 9.6 mg/dL (ref 8.4–10.5)
Chloride: 103 mEq/L (ref 96–112)
Creatinine, Ser: 0.88 mg/dL (ref 0.40–1.20)
GFR: 59.47 mL/min — ABNORMAL LOW (ref >60.00)
Glucose, Bld: 81 mg/dL (ref 70–99)
Potassium: 4.6 mEq/L (ref 3.5–5.1)
Sodium: 140 mEq/L (ref 135–145)
Total Bilirubin: 0.4 mg/dL (ref 0.2–1.2)
Total Protein: 6.8 g/dL (ref 6.0–8.3)

## 2021-07-11 LAB — CBC WITH DIFFERENTIAL/PLATELET
Basophils Absolute: 0.1 10*3/uL (ref 0.0–0.1)
Basophils Relative: 1 % (ref 0.0–3.0)
Eosinophils Absolute: 0.2 10*3/uL (ref 0.0–0.7)
Eosinophils Relative: 2.7 % (ref 0.0–5.0)
HCT: 38.1 % (ref 36.0–46.0)
Hemoglobin: 12.6 g/dL (ref 12.0–15.0)
Lymphocytes Relative: 39.6 % (ref 12.0–46.0)
Lymphs Abs: 2.4 10*3/uL (ref 0.7–4.0)
MCHC: 33.1 g/dL (ref 30.0–36.0)
MCV: 95.1 fl (ref 78.0–100.0)
Monocytes Absolute: 0.4 10*3/uL (ref 0.1–1.0)
Monocytes Relative: 5.7 % (ref 3.0–12.0)
Neutro Abs: 3.2 10*3/uL (ref 1.4–7.7)
Neutrophils Relative %: 51 % (ref 43.0–77.0)
Platelets: 175 10*3/uL (ref 150.0–400.0)
RBC: 4 Mil/uL (ref 3.87–5.11)
RDW: 13.6 % (ref 11.5–15.5)
WBC: 6.2 10*3/uL (ref 4.0–10.5)

## 2021-07-11 LAB — LIPID PANEL
Cholesterol: 225 mg/dL — ABNORMAL HIGH (ref 0–200)
HDL: 66.1 mg/dL (ref >39.00)
LDL Cholesterol: 138 mg/dL — ABNORMAL HIGH (ref 0–99)
NonHDL: 158.65
Total CHOL/HDL Ratio: 3
Triglycerides: 104 mg/dL (ref 0.0–149.0)
VLDL: 20.8 mg/dL (ref 0.0–40.0)

## 2021-07-11 LAB — TSH: TSH: 2.19 u[IU]/mL (ref 0.35–5.50)

## 2021-07-11 LAB — VITAMIN B12: Vitamin B-12: 265 pg/mL (ref 211–911)

## 2021-07-11 LAB — VITAMIN D 25 HYDROXY (VIT D DEFICIENCY, FRACTURES): VITD: 85.23 ng/mL (ref 30.00–100.00)

## 2021-08-20 ENCOUNTER — Other Ambulatory Visit: Payer: Self-pay | Admitting: Family Medicine

## 2021-09-10 ENCOUNTER — Encounter: Payer: Self-pay | Admitting: Family Medicine

## 2021-09-11 DIAGNOSIS — H43813 Vitreous degeneration, bilateral: Secondary | ICD-10-CM | POA: Diagnosis not present

## 2021-09-11 DIAGNOSIS — H524 Presbyopia: Secondary | ICD-10-CM | POA: Diagnosis not present

## 2021-09-21 ENCOUNTER — Other Ambulatory Visit: Payer: Self-pay | Admitting: Family Medicine

## 2021-10-16 ENCOUNTER — Other Ambulatory Visit: Payer: Self-pay | Admitting: Family Medicine

## 2021-12-20 ENCOUNTER — Encounter: Payer: Self-pay | Admitting: Family Medicine

## 2022-04-11 ENCOUNTER — Other Ambulatory Visit: Payer: Self-pay | Admitting: Family Medicine

## 2022-05-07 ENCOUNTER — Ambulatory Visit: Payer: Medicare Other | Admitting: Family Medicine

## 2022-06-12 ENCOUNTER — Other Ambulatory Visit: Payer: Self-pay | Admitting: Family Medicine

## 2022-07-08 ENCOUNTER — Other Ambulatory Visit: Payer: Self-pay | Admitting: Family Medicine

## 2022-07-08 ENCOUNTER — Telehealth: Payer: Self-pay | Admitting: Family Medicine

## 2022-07-08 DIAGNOSIS — E039 Hypothyroidism, unspecified: Secondary | ICD-10-CM

## 2022-07-08 DIAGNOSIS — E559 Vitamin D deficiency, unspecified: Secondary | ICD-10-CM

## 2022-07-08 DIAGNOSIS — E538 Deficiency of other specified B group vitamins: Secondary | ICD-10-CM

## 2022-07-08 DIAGNOSIS — M810 Age-related osteoporosis without current pathological fracture: Secondary | ICD-10-CM

## 2022-07-08 DIAGNOSIS — E7800 Pure hypercholesterolemia, unspecified: Secondary | ICD-10-CM

## 2022-07-08 DIAGNOSIS — Z8679 Personal history of other diseases of the circulatory system: Secondary | ICD-10-CM

## 2022-07-08 NOTE — Telephone Encounter (Signed)
-----   Message from Ellamae Sia sent at 06/25/2022  3:38 PM EDT ----- Regarding: Lab orders for Monday, 8.14.23 Patient is scheduled for CPX labs, please order future labs, Thanks , Karna Christmas

## 2022-07-09 ENCOUNTER — Other Ambulatory Visit (INDEPENDENT_AMBULATORY_CARE_PROVIDER_SITE_OTHER): Payer: Medicare Other

## 2022-07-09 DIAGNOSIS — E7800 Pure hypercholesterolemia, unspecified: Secondary | ICD-10-CM

## 2022-07-09 DIAGNOSIS — E559 Vitamin D deficiency, unspecified: Secondary | ICD-10-CM

## 2022-07-09 DIAGNOSIS — Z8679 Personal history of other diseases of the circulatory system: Secondary | ICD-10-CM | POA: Diagnosis not present

## 2022-07-09 DIAGNOSIS — E039 Hypothyroidism, unspecified: Secondary | ICD-10-CM

## 2022-07-09 DIAGNOSIS — E538 Deficiency of other specified B group vitamins: Secondary | ICD-10-CM | POA: Diagnosis not present

## 2022-07-09 LAB — CBC WITH DIFFERENTIAL/PLATELET
Basophils Absolute: 0 10*3/uL (ref 0.0–0.1)
Basophils Relative: 0.5 % (ref 0.0–3.0)
Eosinophils Absolute: 0.2 10*3/uL (ref 0.0–0.7)
Eosinophils Relative: 5.4 % — ABNORMAL HIGH (ref 0.0–5.0)
HCT: 36.6 % (ref 36.0–46.0)
Hemoglobin: 12.1 g/dL (ref 12.0–15.0)
Lymphocytes Relative: 46.6 % — ABNORMAL HIGH (ref 12.0–46.0)
Lymphs Abs: 1.8 10*3/uL (ref 0.7–4.0)
MCHC: 33 g/dL (ref 30.0–36.0)
MCV: 97.4 fl (ref 78.0–100.0)
Monocytes Absolute: 0.3 10*3/uL (ref 0.1–1.0)
Monocytes Relative: 7.5 % (ref 3.0–12.0)
Neutro Abs: 1.5 10*3/uL (ref 1.4–7.7)
Neutrophils Relative %: 40 % — ABNORMAL LOW (ref 43.0–77.0)
Platelets: 168 10*3/uL (ref 150.0–400.0)
RBC: 3.76 Mil/uL — ABNORMAL LOW (ref 3.87–5.11)
RDW: 13.2 % (ref 11.5–15.5)
WBC: 3.8 10*3/uL — ABNORMAL LOW (ref 4.0–10.5)

## 2022-07-09 LAB — COMPREHENSIVE METABOLIC PANEL
ALT: 11 U/L (ref 0–35)
AST: 16 U/L (ref 0–37)
Albumin: 3.9 g/dL (ref 3.5–5.2)
Alkaline Phosphatase: 54 U/L (ref 39–117)
BUN: 23 mg/dL (ref 6–23)
CO2: 29 mEq/L (ref 19–32)
Calcium: 9.1 mg/dL (ref 8.4–10.5)
Chloride: 106 mEq/L (ref 96–112)
Creatinine, Ser: 0.88 mg/dL (ref 0.40–1.20)
GFR: 59.06 mL/min — ABNORMAL LOW (ref >60.00)
Glucose, Bld: 78 mg/dL (ref 70–99)
Potassium: 4.6 mEq/L (ref 3.5–5.1)
Sodium: 142 mEq/L (ref 135–145)
Total Bilirubin: 0.5 mg/dL (ref 0.2–1.2)
Total Protein: 6.2 g/dL (ref 6.0–8.3)

## 2022-07-09 LAB — VITAMIN B12: Vitamin B-12: 360 pg/mL (ref 211–911)

## 2022-07-09 LAB — LIPID PANEL
Cholesterol: 215 mg/dL — ABNORMAL HIGH (ref 0–200)
HDL: 72.4 mg/dL (ref >39.00)
LDL Cholesterol: 129 mg/dL — ABNORMAL HIGH (ref 0–99)
NonHDL: 142.34
Total CHOL/HDL Ratio: 3
Triglycerides: 67 mg/dL (ref 0.0–149.0)
VLDL: 13.4 mg/dL (ref 0.0–40.0)

## 2022-07-09 LAB — TSH: TSH: 5.48 u[IU]/mL (ref 0.35–5.50)

## 2022-07-09 LAB — VITAMIN D 25 HYDROXY (VIT D DEFICIENCY, FRACTURES): VITD: 62.16 ng/mL (ref 30.00–100.00)

## 2022-07-12 ENCOUNTER — Ambulatory Visit (INDEPENDENT_AMBULATORY_CARE_PROVIDER_SITE_OTHER): Payer: Medicare Other

## 2022-07-12 VITALS — Wt 151.0 lb

## 2022-07-12 DIAGNOSIS — Z0000 Encounter for general adult medical examination without abnormal findings: Secondary | ICD-10-CM

## 2022-07-12 NOTE — Progress Notes (Signed)
Virtual Visit via Telephone Note  I connected with  Carla Kelly on 07/12/22 at  1:45 PM EDT by telephone and verified that I am speaking with the correct person using two identifiers.  Location: Patient: home Provider: [place] Persons participating in the virtual visit: [place]   I discussed the limitations, risks, security and privacy concerns of performing an evaluation and management service by telephone and the availability of in person appointments. The patient expressed understanding and agreed to proceed.  Interactive audio and video telecommunications were attempted between this nurse and patient, however failed, due to patient having technical difficulties OR patient did not have access to video capability.  We continued and completed visit with audio only.  Some vital signs may be absent or patient reported.   Dionisio David, LPN  Subjective:   Carla Kelly is a 88 y.o. female who presents for Medicare Annual (Subsequent) preventive examination.  Review of Systems     Cardiac Risk Factors include: advanced age (>59mn, >>96women);dyslipidemia     Objective:    There were no vitals filed for this visit. There is no height or weight on file to calculate BMI.     07/12/2022    1:49 PM 03/22/2021   12:30 PM 11/08/2020    1:34 PM 11/07/2020   10:18 AM 09/23/2020    1:55 PM 03/06/2018    9:15 AM 02/21/2017    8:30 AM  Advanced Directives  Does Patient Have a Medical Advance Directive? Yes No Yes Yes Yes Yes Yes  Type of AParamedicof AHousatonicLiving will   [place]lo[place] Village[place] will [place]aver[place]Living will HEl MirageLiving will HBird IslandLiving will  Does patient want to make changes to medical advance directive? Yes (Inpatient - patient defers changing a medical advance directive and declines information at this time)   No - Patient declined      Copy of [place] Chart? Yes - validated most recent copy scanned in chart (See row information)   No - copy requested  No - copy requested No - copy requested    Current Medications (verified) Outpatient Encounter Medications as of 07/12/2022  Medication Sig   albuterol (PROVENTIL) 4 MG tablet TAKE 1 TABLET(4 MG) BY MOUTH EVERY 4 HOURS AS NEEDED   Apoaequorin (PREVAGEN) 10 MG CAPS    aspirin 325 MG tablet Take 325 mg by mouth daily.   Calcium-Magnesium-Zinc (CAL-MAG-ZINC PO) Take 1 tablet by mouth in the morning, at noon, and at bedtime.   Cholecalciferol (VITAMIN D3 GUMMIES PO) Take 2 tablets by mouth daily.   diltiazem (CARDIZEM CD) 120 MG 24 hr capsule TAKE 1 CAPSULE(120 MG) BY MOUTH DAILY   furosemide (LASIX) 40 MG tablet TAKE 1/2 TABLET(20 MG) BY MOUTH DAILY   Probiotic Product (PROBIOTIC PO) Take 1 capsule by mouth daily.   SYNTHROID 88 MCG tablet TAKE 1 TABLET(88 MCG) BY MOUTH DAILY BEFORE BREAKFAST   diazepam (VALIUM) 10 MG tablet Take 10 mg by mouth every 6 (six) hours as needed for anxiety. (Patient not taking: Reported on 07/12/2022)   No facility-administered encounter medications on file as of 07/12/2022.    Allergies (verified) Alcohol-sulfur [elemental sulfur], Ergocalciferol, Morphine, Tolterodine tartrate, Alcohol, Myrbetriq [mirabegron], and Nortriptyline hcl   History: Past Medical History:  Diagnosis Date   Adenomatous colon polyp    Anemia    Asthma    Depression    Disorder  of bone and cartilage, unspecified    Embolism and thrombosis of unspecified site    Fibromyalgia    History of herpes zoster    History of hiatal hernia    HLD (hyperlipidemia)    Hypoglycemia    Hypothyroidism    IBS (irritable bowel syndrome)    Mitral valve disorder    "leaking mitral valve-very mild"   Orbital floor fracture ([place]) 10/16   with a fall    Osteoarthritis    Osteoarthrosis, unspecified whether generalized or localized, unspecified site     Other B-complex deficiencies    Psoriasis    Stroke ([place])    Swelling of extremity, right    right leg   TGA (transient global amnesia) 01/2014   Unspecified urinary incontinence    Unspecified vitamin D deficiency    Past Surgical History:  Procedure Laterality Date   ABDOMINAL HYSTERECTOMY  1970   fibroids; endometriosis   APPENDECTOMY     bladder tack  5/05   BREAST BIOPSY Right 1995   BREAST CYST ASPIRATION Left 03/17/2015   CATARACT EXTRACTION Right 12/13   Dr George Ina   CATARACT EXTRACTION W/PHACO Left 05/10/2015   Procedure: CATARACT EXTRACTION PHACO AND INTRAOCULAR LENS PLACEMENT ([place]);  Surgeon: Birder Robson, MD;  Location: [facility] ORS;  Service: Ophthalmology;  Laterality: Left;  [place] 00:59 AP% 26.7 CDE 15.85 fluid pack lot#[id number]   CERVICAL DISC SURGERY     x 2   CHOLECYSTECTOMY  2004   COLONOSCOPY  2/02   polyps, BE-polyps   EYE SURGERY     right eye   JOINT REPLACEMENT     KYPHOPLASTY N/A 11/08/2020   Procedure: L1 Kyphoplasty;  Surgeon: Hessie Knows, MD;  Location: [facility] ORS;  Service: Orthopedics;  Laterality: N/A;   MOUTH SURGERY     lower teeth have 4 studs to keep in place   TONSILLECTOMY     TOTAL KNEE ARTHROPLASTY Right 08/13/2014   Procedure: RIGHT TOTAL KNEE ARTHROPLASTY;  Surgeon: Mcarthur Rossetti, MD;  Location: [facility];  Service: Orthopedics;  Laterality: Right;   TYMPANOPLASTY     US TRANSVAGINAL PELVIC MODIFIED  10/02   small left ovarian cyst   Family History  Problem Relation Age of Onset   Heart attack Mother    Stroke Mother        several   Lupus Brother    Other Sister        benign brain tumor   Asthma Daughter    Breast cancer Daughter    Other Brother        heart problems   Colon cancer Other        1st cousin   Social History   Socioeconomic History   Marital status: Widowed    Spouse name: Not on file   Number of children: 4   Years of education: Not on file   Highest education level: Not on file  Occupational  History   Occupation: Retired  Tobacco Use   Smoking status: Never   Smokeless tobacco: Never  Vaping Use   Vaping Use: Never used  Substance and Sexual Activity   Alcohol use: No    Comment: "is allergic"   Drug use: No   Sexual activity: Not Currently  Other Topics Concern   Not on file  Social History Narrative   Widowed      4 children      Retired      Regular exercise      [employer]  lives with her   Social Determinants of Health   Financial Resource Strain: Low Risk  (07/12/2022)   Overall Financial Resource Strain (CARDIA)    Difficulty of Paying Living Expenses: Not hard at all  Food Insecurity: No Food Insecurity (07/12/2022)   Hunger Vital Sign    Worried About Running Out of Food in the Last Year: Never true    Ran Out of Food in the Last Year: Never true  Transportation Needs: No Transportation Needs (07/12/2022)   PRAPARE - Hydrologist (Medical): No    Lack of Transportation (Non-Medical): No  Physical Activity: Insufficiently Active (07/12/2022)   Exercise Vital Sign    Days of Exercise per Week: 7 days    Minutes of Exercise per Session: 20 min  Stress: No Stress Concern Present (07/12/2022)   [place]    Feeling of Stress : Only a little  Social Connections: Socially Isolated (07/12/2022)   Social Connection and Isolation Panel [NHANES]    Frequency of Communication with Friends and Family: Three times a week    Frequency of Social Gatherings with Friends and Family: More than three times a week    Attends Religious Services: Never    Marine scientist or Organizations: No    Attends Archivist Meetings: Never    Marital Status: Widowed    Tobacco Counseling Counseling given: Not Answered   Clinical Intake:  Pre-visit preparation completed: Yes  Pain : No/denies pain     Nutritional Risks: None Diabetes: No  How often do you  need to have someone help you when you read instructions, pamphlets, or other written materials from your doctor or pharmacy?: 1 - Never  Diabetic?no  Interpreter Needed?: No  Information entered by :: Kirke Shaggy, LPN   Activities of Daily Living    07/12/2022    1:51 PM 07/08/2022   10:06 AM  In your present state of health, do you have any difficulty performing the following activities:  Hearing? 1 1  Vision? 0 0  Difficulty concentrating or making decisions? 0 0  Walking or climbing stairs? 1 1  Dressing or bathing? 0 0  Doing errands, shopping? 1 1  Preparing Food and eating ? N N  Using the Toilet? N N  In the past six months, have you accidently leaked urine? Y Y  Do you have problems with loss of bowel control? N N  Managing your Medications? N N  Managing your Finances? N N  Housekeeping or managing your Housekeeping? Tempie Donning    Patient Care Team: [place], Wynelle Fanny, MD as PCP - General Birder Robson, MD as Referring Physician (Ophthalmology)  Indicate any recent Medical Services you may have received from other than [facility] providers in the past year (date may be approximate).     Assessment:   This is a routine wellness examination for Carla Kelly.  Hearing/Vision screen Hearing Screening - Comments:: No aids Vision Screening - Comments:: Wears glasses- [facility]  Dietary issues and exercise activities discussed: Current Exercise Habits: Home exercise routine, Type of exercise: walking, Time (Minutes): 20, Frequency (Times/Week): 7, Weekly Exercise (Minutes/Week): 140, Intensity: Mild   Goals Addressed             This Visit's Progress    DIET - EAT MORE FRUITS AND VEGETABLES        Depression Screen    07/12/2022    1:48 PM 07/10/2021  2:06 PM 07/06/2020    3:25 PM 03/06/2018    9:21 AM 02/21/2017    8:29 AM 02/21/2016    1:52 PM 02/21/2016    1:50 PM  PHQ 2/9 Scores  PHQ - 2 Score 0 0 0 1 1 0 0  PHQ- 9 Score 0   1 5      Fall Risk    07/12/2022     1:50 PM 07/08/2022   10:06 AM 07/10/2021    2:06 PM 07/06/2020    3:25 PM 03/06/2018    9:21 AM  Fall Risk   Falls in the past year? 0 0 0 0 No  Number falls in past yr: 0      Injury with Fall? 0      Risk for fall due to : No Fall Risks      Follow up Falls evaluation completed  Falls evaluation completed Falls evaluation completed     [place]:  Any stairs in or around the home? Yes  If so, are there any without handrails? No  Home free of loose throw rugs in walkways, pet beds, electrical cords, etc? Yes  Adequate lighting in your home to reduce risk of falls? Yes   ASSISTIVE DEVICES UTILIZED TO PREVENT FALLS:  Life alert? No  Use of a cane, walker or w/c? Yes  Grab bars in the bathroom? Yes  Shower chair or bench in shower? Yes  Elevated toilet seat or a handicapped toilet? Yes    Cognitive Function:    03/06/2018    9:26 AM 02/21/2017    8:29 AM 02/21/2016    1:57 PM  MMSE - Mini Mental State Exam  Orientation to time '5 5 5  '$ Orientation to Place '5 5 5  '$ Registration '3 3 3  '$ Attention/ Calculation 0 0 0  Recall '3 3 3  '$ Language- name 2 objects 0 0 0  Language- repeat '1 1 1  '$ Language- follow 3 step command '3 3 3  '$ Language- read & follow direction 0 0 0  Write a sentence 0 0 0  Copy design 0 0 0  Total score '20 20 20        '$ 07/12/2022    1:54 PM  6CIT Screen  What Year? 0 points  What month? 0 points  What time? 0 points  Count back from 20 0 points  Months in reverse 0 points  Repeat phrase 4 points  Total Score 4 points    Immunizations Immunization History  Administered Date(s) Administered   Fluad Quad(high Dose 65+) 10/03/2020   Influenza Split 08/30/2011, 08/19/2012   Influenza Whole 09/01/2004, 10/10/2007, 08/25/2008, 08/29/2009, 08/22/2010   Influenza, High Dose Seasonal PF 08/02/2017   Influenza,inj,Quad PF,6+ Mos 09/01/2013, 08/17/2015, 08/17/2016, 09/04/2018, 07/21/2019   Influenza-Unspecified 12/15/2021    Moderna Sars-Covid-2 Vaccination 12/02/2020, 09/01/2021   PFIZER(Purple Top)SARS-COV-2 Vaccination 12/10/2019, 12/31/2019   Pneumococcal Conjugate-13 02/15/2015   Pneumococcal Polysaccharide-23 08/25/2008   Td 03/03/2003, 12/12/2012    TDAP status: Up to date  Flu Vaccine status: Up to date  Pneumococcal vaccine status: Up to date  Covid-19 vaccine status: Completed vaccines  Qualifies for Shingles Vaccine? Yes   Zostavax completed No   Shingrix Completed?: No.    Education has been provided regarding the importance of this vaccine. Patient has been advised to call insurance company to determine out of pocket expense if they have not yet received this vaccine. Advised may also receive vaccine at local pharmacy or  Health Dept. Verbalized acceptance and understanding.  Screening Tests Health Maintenance  Topic Date Due   Zoster Vaccines- Shingrix (1 of 2) Never done   COVID-19 Vaccine (5 - Pfizer series) 10/27/2021   MAMMOGRAM  05/05/2022   INFLUENZA VACCINE  06/26/2022   TETANUS/TDAP  12/12/2022   Pneumonia Vaccine 74+ Years old  Completed   DEXA SCAN  Completed   HPV VACCINES  Aged Out    Health Maintenance  Health Maintenance Due  Topic Date Due   Zoster Vaccines- Shingrix (1 of 2) Never done   COVID-19 Vaccine (5 - Pfizer series) 10/27/2021   MAMMOGRAM  05/05/2022   INFLUENZA VACCINE  06/26/2022    Colorectal cancer screening: No longer required.   Mammogram status: No longer required due to age.   Lung Cancer Screening: (Low Dose CT Chest recommended if Age 37-80 years, 30 pack-year currently smoking OR have quit w/in 15years.) does not qualify.   Additional Screening:  Hepatitis C Screening: does not qualify; Completed no  Vision Screening: Recommended annual ophthalmology exams for early detection of glaucoma and other disorders of the eye. Is the patient up to date with their annual eye exam?  Yes  Who is the provider or what is the name of the office in  which the patient attends annual eye exams? [facility] If pt is not established with a provider, would they like to be referred to a provider to establish care? No .   Dental Screening: Recommended annual dental exams for proper oral hygiene  Community Resource Referral / Chronic Care Management: CRR required this visit?  No   CCM required this visit?  No      Plan:     I have personally reviewed and noted the following in the patient's chart:   Medical and social history Use of alcohol, tobacco or illicit drugs  Current medications and supplements including opioid prescriptions.  Functional ability and status Nutritional status Physical activity Advanced directives List of other physicians Hospitalizations, surgeries, and ER visits in previous 12 months Vitals Screenings to include cognitive, depression, and falls Referrals and appointments  In addition, I have reviewed and discussed with patient certain preventive protocols, quality metrics, and best practice recommendations. A written personalized care plan for preventive services as well as general preventive health recommendations were provided to patient.     Dionisio David, LPN   [id number]   Nurse Notes: none

## 2022-07-12 NOTE — Patient Instructions (Signed)
Carla Kelly , Thank you for taking time to come for your Medicare Wellness Visit. I appreciate your ongoing commitment to your health goals. Please review the following plan we discussed and let me know if I can assist you in the future.   Screening recommendations/referrals: Colonoscopy: aged out Mammogram: aged out Bone Density: aged out Recommended yearly ophthalmology/optometry visit for glaucoma screening and checkup Recommended yearly dental visit for hygiene and checkup  Vaccinations: Influenza vaccine: 12/15/21 Pneumococcal vaccine: 02/15/15 Tdap vaccine: 12/12/12 Shingles vaccine: n/d   Covid-19:12/10/19, 12/31/19, 12/02/20, 09/01/21  Advanced directives: yes  Conditions/risks identified: no  Next appointment: Follow up in one year for your annual wellness visit 07/15/23 @ 2:15 pm by phone   Preventive Care 37 Years and Older, Female Preventive care refers to lifestyle choices and visits with your health care provider that can promote health and wellness. What does preventive care include? A yearly physical exam. This is also called an annual well check. Dental exams once or twice a year. Routine eye exams. Ask your health care provider how often you should have your eyes checked. Personal lifestyle choices, including: Daily care of your teeth and gums. Regular physical activity. Eating a healthy diet. Avoiding tobacco and drug use. Limiting alcohol use. Practicing safe sex. Taking low-dose aspirin every day. Taking vitamin and mineral supplements as recommended by your health care provider. What happens during an annual well check? The services and screenings done by your health care provider during your annual well check will depend on your age, overall health, lifestyle risk factors, and family history of disease. Counseling  Your health care provider may ask you questions about your: Alcohol use. Tobacco use. Drug use. Emotional well-being. Home and relationship  well-being. Sexual activity. Eating habits. History of falls. Memory and ability to understand (cognition). Work and work Statistician. Reproductive health. Screening  You may have the following tests or measurements: Height, weight, and BMI. Blood pressure. Lipid and cholesterol levels. These may be checked every 5 years, or more frequently if you are over 43 years old. Skin check. Lung cancer screening. You may have this screening every year starting at age 96 if you have a 30-pack-year history of smoking and currently smoke or have quit within the past 15 years. Fecal occult blood test (FOBT) of the stool. You may have this test every year starting at age 29. Flexible sigmoidoscopy or colonoscopy. You may have a sigmoidoscopy every 5 years or a colonoscopy every 10 years starting at age 98. Hepatitis C blood test. Hepatitis B blood test. Sexually transmitted disease (STD) testing. Diabetes screening. This is done by checking your blood sugar (glucose) after you have not eaten for a while (fasting). You may have this done every 1-3 years. Bone density scan. This is done to screen for osteoporosis. You may have this done starting at age 4. Mammogram. This may be done every 1-2 years. Talk to your health care provider about how often you should have regular mammograms. Talk with your health care provider about your test results, treatment options, and if necessary, the need for more tests. Vaccines  Your health care provider may recommend certain vaccines, such as: Influenza vaccine. This is recommended every year. Tetanus, diphtheria, and acellular pertussis (Tdap, Td) vaccine. You may need a Td booster every 10 years. Zoster vaccine. You may need this after age 51. Pneumococcal 13-valent conjugate (PCV13) vaccine. One dose is recommended after age 49. Pneumococcal polysaccharide (PPSV23) vaccine. One dose is recommended after age 33. Talk  to your health care provider about which  screenings and vaccines you need and how often you need them. This information is not intended to replace advice given to you by your health care provider. Make sure you discuss any questions you have with your health care provider. Document Released: 12/09/2015 Document Revised: 08/01/2016 Document Reviewed: 09/13/2015 Elsevier Interactive Patient Education  2017 [place] Prevention in the Home Falls can cause injuries. They can happen to people of all ages. There are many things you can do to make your home safe and to help prevent falls. What can I do on the outside of my home? Regularly fix the edges of walkways and driveways and fix any cracks. Remove anything that might make you trip as you walk through a door, such as a raised step or threshold. Trim any bushes or trees on the path to your home. Use bright outdoor lighting. Clear any walking paths of anything that might make someone trip, such as rocks or tools. Regularly check to see if handrails are loose or broken. Make sure that both sides of any steps have handrails. Any raised decks and porches should have guardrails on the edges. Have any leaves, snow, or ice cleared regularly. Use sand or salt on walking paths during winter. Clean up any spills in your garage right away. This includes oil or grease spills. What can I do in the bathroom? Use night lights. Install grab bars by the toilet and in the tub and shower. Do not use towel bars as grab bars. Use non-skid mats or decals in the tub or shower. If you need to sit down in the shower, use a plastic, non-slip stool. Keep the floor dry. Clean up any water that spills on the floor as soon as it happens. Remove soap buildup in the tub or shower regularly. Attach bath mats securely with double-sided non-slip rug tape. Do not have throw rugs and other things on the floor that can make you trip. What can I do in the bedroom? Use night lights. Make sure that you have a  light by your bed that is easy to reach. Do not use any sheets or blankets that are too big for your bed. They should not hang down onto the floor. Have a firm chair that has side arms. You can use this for support while you get dressed. Do not have throw rugs and other things on the floor that can make you trip. What can I do in the kitchen? Clean up any spills right away. Avoid walking on wet floors. Keep items that you use a lot in easy-to-reach places. If you need to reach something above you, use a strong step stool that has a grab bar. Keep electrical cords out of the way. Do not use floor polish or wax that makes floors slippery. If you must use wax, use non-skid floor wax. Do not have throw rugs and other things on the floor that can make you trip. What can I do with my stairs? Do not leave any items on the stairs. Make sure that there are handrails on both sides of the stairs and use them. Fix handrails that are broken or loose. Make sure that handrails are as long as the stairways. Check any carpeting to make sure that it is firmly attached to the stairs. Fix any carpet that is loose or worn. Avoid having throw rugs at the top or bottom of the stairs. If you do have throw rugs,  attach them to the floor with carpet tape. Make sure that you have a light switch at the top of the stairs and the bottom of the stairs. If you do not have them, ask someone to add them for you. What else can I do to help prevent falls? Wear shoes that: Do not have high heels. Have rubber bottoms. Are comfortable and fit you well. Are closed at the toe. Do not wear sandals. If you use a stepladder: Make sure that it is fully opened. Do not climb a closed stepladder. Make sure that both sides of the stepladder are locked into place. Ask someone to hold it for you, if possible. Clearly mark and make sure that you can see: Any grab bars or handrails. First and last steps. Where the edge of each step  is. Use tools that help you move around (mobility aids) if they are needed. These include: Canes. Walkers. Scooters. Crutches. Turn on the lights when you go into a dark area. Replace any light bulbs as soon as they burn out. Set up your furniture so you have a clear path. Avoid moving your furniture around. If any of your floors are uneven, fix them. If there are any pets around you, be aware of where they are. Review your medicines with your doctor. Some medicines can make you feel dizzy. This can increase your chance of falling. Ask your doctor what other things that you can do to help prevent falls. This information is not intended to replace advice given to you by your health care provider. Make sure you discuss any questions you have with your health care provider. Document Released: 09/08/2009 Document Revised: 04/19/2016 Document Reviewed: 12/17/2014 Elsevier Interactive Patient Education  2017 [employer].

## 2022-07-16 ENCOUNTER — Ambulatory Visit (INDEPENDENT_AMBULATORY_CARE_PROVIDER_SITE_OTHER): Payer: Medicare Other | Admitting: Family Medicine

## 2022-07-16 ENCOUNTER — Encounter: Payer: Self-pay | Admitting: Family Medicine

## 2022-07-16 VITALS — BP 129/75 | HR 79 | Ht 65.5 in | Wt 159.8 lb

## 2022-07-16 DIAGNOSIS — E538 Deficiency of other specified B group vitamins: Secondary | ICD-10-CM

## 2022-07-16 DIAGNOSIS — M810 Age-related osteoporosis without current pathological fracture: Secondary | ICD-10-CM | POA: Diagnosis not present

## 2022-07-16 DIAGNOSIS — E039 Hypothyroidism, unspecified: Secondary | ICD-10-CM

## 2022-07-16 DIAGNOSIS — Z8679 Personal history of other diseases of the circulatory system: Secondary | ICD-10-CM

## 2022-07-16 DIAGNOSIS — Z0000 Encounter for general adult medical examination without abnormal findings: Secondary | ICD-10-CM

## 2022-07-16 DIAGNOSIS — R600 Localized edema: Secondary | ICD-10-CM

## 2022-07-16 DIAGNOSIS — I700 Atherosclerosis of aorta: Secondary | ICD-10-CM | POA: Diagnosis not present

## 2022-07-16 DIAGNOSIS — E559 Vitamin D deficiency, unspecified: Secondary | ICD-10-CM

## 2022-07-16 DIAGNOSIS — E7800 Pure hypercholesterolemia, unspecified: Secondary | ICD-10-CM

## 2022-07-16 MED ORDER — DILTIAZEM HCL ER COATED BEADS 120 MG PO CP24: ORAL_CAPSULE | ORAL | 3 refills | Status: DC

## 2022-07-16 MED ORDER — SYNTHROID 88 MCG PO TABS: ORAL_TABLET | ORAL | 3 refills | Status: DC

## 2022-07-16 MED ORDER — FUROSEMIDE 40 MG PO TABS: 20.0000 mg | ORAL_TABLET | Freq: Every day | ORAL | 3 refills | Status: DC

## 2022-07-16 NOTE — Assessment & Plan Note (Addendum)
Suspect from cardizem and hot weather  No sob or cp   Suggest support garments if she can tolerate them and get them on Per pt-they bother her feet  Plans to also watch sodium intake  Takes lasix 20-40 mg daily

## 2022-07-16 NOTE — Progress Notes (Signed)
Subjective:    Patient ID: Carla Kelly, female    DOB: Jun 19, 1933, 88 y.o.   MRN: [id number]  HPI Here for health maintenance exam and to review chronic medical problems   Wt Readings from Last 3 Encounters:  07/16/22 159 lb 12.8 oz (72.5 kg)  07/12/22 151 lb (68.5 kg)  07/10/21 151 lb 8 oz (68.7 kg)   26.19 kg/m  Trying to get  house sold  Stressful  Family is helping out - putting together basement apt   Brain fog recently - felt that with covid shots  Now from yesterday -no reason however  Gets up at 6 am  Does not feel like she has a uti -no urinary changes Her incontinence is about the same   Hands and feet stay swollen -diltiazem and heat together  Lasix 40 mg -taking 1 instead of 1/2   Toes make it hard to wear compression hose    Immunization History  Administered Date(s) Administered   Fluad Quad(high Dose 65+) 10/03/2020   Influenza Split 08/30/2011, 08/19/2012   Influenza Whole 09/01/2004, 10/10/2007, 08/25/2008, 08/29/2009, 08/22/2010   Influenza, High Dose Seasonal PF 08/02/2017   Influenza,inj,Quad PF,6+ Mos 09/01/2013, 08/17/2015, 08/17/2016, 09/04/2018, 07/21/2019   Influenza-Unspecified 12/15/2021   Moderna Sars-Covid-2 Vaccination 12/02/2020, 09/01/2021   PFIZER(Purple Top)SARS-COV-2 Vaccination 12/10/2019, 12/31/2019   Pneumococcal Conjugate-13 02/15/2015   Pneumococcal Polysaccharide-23 08/25/2008   Td 03/03/2003, 12/12/2012   Health Maintenance Due  Topic Date Due   Zoster Vaccines- Shingrix (1 of 2) Never done   COVID-19 Vaccine (5 - Pfizer series) 10/27/2021   MAMMOGRAM  05/05/2022   INFLUENZA VACCINE  06/26/2022   Shingrix: declined in the past    Mammogram 04/2021 Self breast exam: no lumps   Dexa  04/2021 OP Falls- none Fractures-none  Supplements -vit D Good level   Exercise : uses a cane / has exercise bike- tries to use daily but sometimes feet bother her too much   BP Readings from Last 3 Encounters:  07/16/22 129/75   07/10/21 138/70  04/04/21 131/70    Pulse Readings from Last 3 Encounters:  07/16/22 79  07/10/21 76  04/04/21 81   Lab Results  Component Value Date   CREATININE 0.88 07/09/2022   BUN 23 07/09/2022   NA 142 07/09/2022   K 4.6 07/09/2022   CL 106 07/09/2022   CO2 29 07/09/2022   Hypothyroidism  Pt has no clinical changes No change in energy level/ hair or skin/ edema and no tremor Lab Results  Component Value Date   TSH 5.48 07/09/2022    Levothy 88 mcg daily    Vit B12 def Lab Results  Component Value Date   VITAMINB12 360 07/09/2022   Hyperlipidemia Lab Results  Component Value Date   CHOL 215 (H) 07/09/2022   CHOL 225 (H) 07/10/2021   CHOL 219 (H) 06/29/2020   Lab Results  Component Value Date   HDL 72.40 07/09/2022   HDL 66.10 07/10/2021   HDL 67.50 06/29/2020   Lab Results  Component Value Date   LDLCALC 129 (H) 07/09/2022   LDLCALC 138 (H) 07/10/2021   LDLCALC 138 (H) 06/29/2020   Lab Results  Component Value Date   TRIG 67.0 07/09/2022   TRIG 104.0 07/10/2021   TRIG 64.0 06/29/2020   Lab Results  Component Value Date   CHOLHDL 3 07/09/2022   CHOLHDL 3 07/10/2021   CHOLHDL 3 06/29/2020   Lab Results  Component Value Date   LDLDIRECT 149.5 12/08/2013  LDLDIRECT 111.0 12/04/2012   LDLDIRECT 128.3 09/17/2011   Diet controlled   Lab Results  Component Value Date   WBC 3.8 (L) 07/09/2022   HGB 12.1 07/09/2022   HCT 36.6 07/09/2022   MCV 97.4 07/09/2022   PLT 168.0 07/09/2022  . Lab Results  Component Value Date   ALT 11 07/09/2022   AST 16 07/09/2022   ALKPHOS 54 07/09/2022   BILITOT 0.5 07/09/2022     Patient Active Problem List   Diagnosis Date Noted   Aortic atherosclerosis ([place]) 04/20/2021   Strain of iliopsoas muscle, right, sequela 04/04/2021   History of atrial fibrillation 03/22/2021   Constipation 10/03/2020   Closed compression fracture of L1 vertebra ([place]) 09/26/2020   Hearing loss 07/06/2020   Hammer toe  of right foot 03/12/2018   Screening mammogram, encounter for 03/04/2017   Routine general medical examination at a health care facility 02/12/2016   Estrogen deficiency 02/15/2015   Colon cancer screening 02/15/2015   Arthritis of right knee 08/13/2014   Status post total right knee replacement 08/13/2014   Transient global amnesia 02/20/2014   Encounter for Medicare annual wellness exam 12/07/2013   Psoriasis 02/16/2013   Pedal edema 03/11/2012   B12 deficiency 09/05/2009   Vitamin D deficiency 11/08/2008   Hyperlipidemia 08/25/2008   Osteoporosis 08/25/2008   History of colonic polyps 08/25/2008   HERPES ZOSTER, HX OF 06/03/2008   Hypothyroidism 08/04/2007   HYPOGLYCEMIA 08/04/2007   ASTHMA 08/04/2007   IRRITABLE BOWEL SYNDROME 08/04/2007   VAGINITIS, ATROPHIC 08/04/2007   Osteoarthrosis, unspecified whether generalized or localized, unspecified site 08/04/2007   FIBROMYALGIA 08/04/2007   URINARY INCONTINENCE 08/04/2007   Asthma 08/04/2007   Past Medical History:  Diagnosis Date   Adenomatous colon polyp    Anemia    Asthma    Depression    Disorder of bone and cartilage, unspecified    Embolism and thrombosis of unspecified site    Fibromyalgia    History of herpes zoster    History of hiatal hernia    HLD (hyperlipidemia)    Hypoglycemia    Hypothyroidism    IBS (irritable bowel syndrome)    Mitral valve disorder    "leaking mitral valve-very mild"   Orbital floor fracture ([place]) 10/16   with a fall    Osteoarthritis    Osteoarthrosis, unspecified whether generalized or localized, unspecified site    Other B-complex deficiencies    Psoriasis    Stroke ([place])    Swelling of extremity, right    right leg   TGA (transient global amnesia) 01/2014   Unspecified urinary incontinence    Unspecified vitamin D deficiency    Past Surgical History:  Procedure Laterality Date   ABDOMINAL HYSTERECTOMY  1970   fibroids; endometriosis   APPENDECTOMY     bladder tack   5/05   BREAST BIOPSY Right 1995   BREAST CYST ASPIRATION Left 03/17/2015   CATARACT EXTRACTION Right 12/13   Dr George Ina   CATARACT EXTRACTION W/PHACO Left 05/10/2015   Procedure: CATARACT EXTRACTION PHACO AND INTRAOCULAR LENS PLACEMENT ([place]);  Surgeon: Birder Robson, MD;  Location: [facility] OR[facility];  Service: Ophthalmology;  Laterality: Left;  [place] 00:59 AP% 26.7 CDE 15.85 fluid pack lot#[id number]   CERVICAL DISC SURGERY     x 2   CHOLECYSTECTOMY  2004   COLONOSCOPY  2/02   polyps, BE-polyps   EYE SURGERY     right eye   JOINT REPLACEMENT     KYPHOPLASTY N/A 11/08/2020  Procedure: L1 Kyphoplasty;  Surgeon: Hessie Knows, MD;  Location: [facility];  Service: Orthopedics;  Laterality: N/A;   MOUTH SURGERY     lower teeth have 4 studs to keep in place   TONSILLECTOMY     TOTAL KNEE ARTHROPLASTY Right 08/13/2014   Procedure: RIGHT TOTAL KNEE ARTHROPLASTY;  Surgeon: Mcarthur Rossetti, MD;  Location: [facility];  Service: Orthopedics;  Laterality: Right;   TYMPANOPLASTY     US TRANSVAGINAL PELVIC MODIFIED  10/02   small left ovarian cyst   Social History   Tobacco Use   Smoking status: Never   Smokeless tobacco: Never  Vaping Use   Vaping Use: Never used  Substance Use Topics   Alcohol use: No    Comment: "is allergic"   Drug use: No   Family History  Problem Relation Age of Onset   Heart attack Mother    Stroke Mother        several   Lupus Brother    Other Sister        benign brain tumor   Asthma Daughter    Breast cancer Daughter    Other Brother        heart problems   Colon cancer Other        1st cousin   Allergies  Allergen Reactions   Alcohol-Sulfur [Elemental Sulfur] Shortness Of Breath and Swelling    Alcohol po swells throat   Ergocalciferol Other (See Comments)    REACTION: GI side effects   Morphine Nausea And Vomiting   Tolterodine Tartrate Other (See Comments)    REACTION: ? reaction   Alcohol Other (See Comments) and Swelling   Myrbetriq  [Mirabegron] Other (See Comments)    GI Upset   Nortriptyline Hcl Other (See Comments)    unknown   Current Outpatient Medications on File Prior to Visit  Medication Sig Dispense Refill   albuterol (PROVENTIL) 4 MG tablet TAKE 1 TABLET(4 MG) BY MOUTH EVERY 4 HOURS AS NEEDED 30 tablet 5   Apoaequorin (PREVAGEN) 10 MG CAPS      aspirin 325 MG tablet Take 325 mg by mouth daily.     Calcium-Magnesium-Zinc (CAL-MAG-ZINC PO) Take 1 tablet by mouth in the morning, at noon, and at bedtime.     Cholecalciferol (VITAMIN D3 GUMMIES PO) Take 2 tablets by mouth daily.     diazepam (VALIUM) 10 MG tablet Take 10 mg by mouth every 6 (six) hours as needed for anxiety.     Probiotic Product (PROBIOTIC PO) Take 1 capsule by mouth daily.     No current facility-administered medications on file prior to visit.    Review of Systems  Constitutional:  Positive for fatigue. Negative for activity change, appetite change, fever and unexpected weight change.  HENT:  Negative for congestion, ear pain, rhinorrhea, sinus pressure and sore throat.   Eyes:  Negative for pain, redness and visual disturbance.  Respiratory:  Negative for cough, shortness of breath and wheezing.   Cardiovascular:  Negative for chest pain and palpitations.  Gastrointestinal:  Negative for abdominal pain, blood in stool, constipation and diarrhea.  Endocrine: Negative for polydipsia and polyuria.  Genitourinary:  Negative for dysuria, frequency and urgency.  Musculoskeletal:  Positive for arthralgias and back pain. Negative for myalgias.  Skin:  Negative for pallor and rash.  Allergic/Immunologic: Negative for environmental allergies.  Neurological:  Negative for dizziness, syncope and headaches.  Hematological:  Negative for adenopathy. Does not bruise/bleed easily.  Psychiatric/Behavioral:  Negative for decreased concentration  and dysphoric mood. The patient is not nervous/anxious.        Objective:   Physical Exam Constitutional:       General: She is not in acute distress.    Appearance: Normal appearance. She is well-developed and normal weight. She is not ill-appearing or diaphoretic.  HENT:     Head: Normocephalic and atraumatic.     Right Ear: Tympanic membrane, ear canal and external ear normal.     Left Ear: Tympanic membrane, ear canal and external ear normal.     Nose: Nose normal. No congestion.     Mouth/Throat:     Mouth: Mucous membranes are moist.     Pharynx: Oropharynx is clear. No posterior oropharyngeal erythema.  Eyes:     General: No scleral icterus.    Extraocular Movements: Extraocular movements intact.     Conjunctiva/sclera: Conjunctivae normal.     Pupils: Pupils are equal, round, and reactive to light.  Neck:     Thyroid: No thyromegaly.     Vascular: No carotid bruit or JVD.  Cardiovascular:     Rate and Rhythm: Normal rate and regular rhythm.     Pulses: Normal pulses.     Heart sounds: Normal heart sounds.     No gallop.  Pulmonary:     Effort: Pulmonary effort is normal. No respiratory distress.     Breath sounds: Normal breath sounds. No wheezing.     Comments: Good air exch Chest:     Chest wall: No tenderness.  Abdominal:     General: Bowel sounds are normal. There is no distension or abdominal bruit.     Palpations: Abdomen is soft. There is no mass.     Tenderness: There is no abdominal tenderness.     Hernia: No hernia is present.  Genitourinary:    Comments: Breast exam: No mass, nodules, thickening, tenderness, bulging, retraction, inflamation, nipple discharge or skin changes noted.  No axillary or clavicular LA.     Musculoskeletal:        General: No tenderness. Normal range of motion.     Cervical back: Normal range of motion and neck supple. No rigidity. No muscular tenderness.     Right lower leg: Edema present.     Left lower leg: Edema present.     Comments: No kyphosis   One plus pedal edema   Lymphadenopathy:     Cervical: No cervical adenopathy.   Skin:    General: Skin is warm and dry.     Coloration: Skin is not pale.     Findings: No erythema or rash.     Comments: Corn on top of R 4th toe   Solar lentigines diffusely Some sks    Neurological:     Mental Status: She is alert. Mental status is at baseline.     Cranial Nerves: No cranial nerve deficit.     Motor: No abnormal muscle tone.     Coordination: Coordination normal.     Gait: Gait normal.     Deep Tendon Reflexes: Reflexes are normal and symmetric. Reflexes normal.  Psychiatric:        Mood and Affect: Mood normal.        Cognition and Memory: Cognition and memory normal.           Assessment & Plan:   Problem List Items Addressed This Visit       Cardiovascular and Mediastinum   Aortic atherosclerosis ([place])    No symptoms Watching bp  and cholesterol      Relevant Medications   diltiazem (CARDIZEM CD) 120 MG 24 hr capsule   furosemide (LASIX) 40 MG tablet     Endocrine   Hypothyroidism    Hypothyroidism  Pt has no clinical changes No change in energy level/ hair or skin/ edema and no tremor Lab Results  Component Value Date   TSH 5.48 07/09/2022    Plan to continue levothyroxine 88 mcg daily      Relevant Medications   SYNTHROID 88 MCG tablet     Musculoskeletal and Integument   Osteoporosis    dexa 04/2021  Pt declines any treatment Taking vit D No falls or fractures   Disc need for calcium/ vitamin D/ wt bearing exercise and bone density test every 2 y to monitor Disc safety/ fracture risk in detail           Other   B12 deficiency    Lab Results  Component Value Date   VITAMINB12 360 07/09/2022  In nl range with oral supplementation       History of atrial fibrillation    Nl rhythm continues with cardizem  bp and pulse are stable   Causes some pedal edema  Recommend she stay on it because no reoccurrence        Hyperlipidemia    Disc goals for lipids and reasons to control them Rev last labs with pt Rev  low sat fat diet in detail  Diet controlled with LDL of 129  HDL up and LDL down a bit  Ratio of 3       Relevant Medications   diltiazem (CARDIZEM CD) 120 MG 24 hr capsule   furosemide (LASIX) 40 MG tablet   Pedal edema    Suspect from cardizem and hot weather  No sob or cp   Suggest support garments if she can tolerate them and get them on Per pt-they bother her feet  Plans to also watch sodium intake  Takes lasix 20-40 mg daily      Routine general medical examination at a health care facility - Primary    Reviewed health habits including diet and exercise and skin cancer prevention Reviewed appropriate screening tests for age  Also reviewed health mt list, fam hx and immunization status , as well as social and family history   See HPI Labs reviewed  Declines shingrix in past- may consider this year Enc flu shot in the fall and covid shot as recommended  Mammogram is due-she plans to call and schedule dexa utd, declines tx for OP       Vitamin D deficiency    Vitamin D level is therapeutic with current supplementation Disc importance of this to bone and overall health

## 2022-07-16 NOTE — Assessment & Plan Note (Signed)
dexa 04/2021  Pt declines any treatment Taking vit D No falls or fractures   Disc need for calcium/ vitamin D/ wt bearing exercise and bone density test every 2 y to monitor Disc safety/ fracture risk in detail

## 2022-07-16 NOTE — Assessment & Plan Note (Signed)
Hypothyroidism  Pt has no clinical changes No change in energy level/ hair or skin/ edema and no tremor Lab Results  Component Value Date   TSH 5.48 07/09/2022    Plan to continue levothyroxine 88 mcg daily

## 2022-07-16 NOTE — Assessment & Plan Note (Signed)
No symptoms Watching bp and cholesterol

## 2022-07-16 NOTE — Assessment & Plan Note (Signed)
Lab Results  Component Value Date   VITAMINB12 360 07/09/2022   In nl range with oral supplementation

## 2022-07-16 NOTE — Assessment & Plan Note (Signed)
Nl rhythm continues with cardizem  bp and pulse are stable   Causes some pedal edema  Recommend she stay on it because no reoccurrence

## 2022-07-16 NOTE — Assessment & Plan Note (Signed)
Vitamin D level is therapeutic with current supplementation Disc importance of this to bone and overall health  

## 2022-07-16 NOTE — Assessment & Plan Note (Signed)
Reviewed health habits including diet and exercise and skin cancer prevention Reviewed appropriate screening tests for age  Also reviewed health mt list, fam hx and immunization status , as well as social and family history   See HPI Labs reviewed  Declines shingrix in past- may consider this year Enc flu shot in the fall and covid shot as recommended  Mammogram is due-she plans to call and schedule dexa utd, declines tx for OP

## 2022-07-16 NOTE — Patient Instructions (Addendum)
If you are interested in the new shingles vaccine (Shingrix) - call your local pharmacy to check on coverage and availability  If affordable, get on a wait list at your pharmacy to get the vaccine.  Space this out from other vaccines by 1 month   Call and schedule your mammogram at the Breast center   Please call the location of your choice from the menu below to schedule your Mammogram and/or Bone Density appointment.    [facility]                      Phone:  [phone], [place] [address]                                                             Services: Traditional and 3D Mammogram, [place] [facility] Density                 Phone: [phone] [address], [place] [address]    Service: Bone Density ONLY   *this site does NOT perform mammograms  [facility]                        Phone:  [phone] [address], [place] [address]                                            Services:  3D Mammogram and [place] at [facility]   Phone:  [phone]   [place], [place] [address]                                            Services: 3D Mammogram and [place]  [facility]  at [facility])  Phone:  [phone]   [address] Room [place], [place] [address]                                              Services:  3D Mammogram and Bone Density

## 2022-07-16 NOTE — Assessment & Plan Note (Signed)
Disc goals for lipids and reasons to control them Rev last labs with pt Rev low sat fat diet in detail  Diet controlled with LDL of 129  HDL up and LDL down a bit  Ratio of 3

## 2022-07-17 DIAGNOSIS — S81801A Unspecified open wound, right lower leg, initial encounter: Secondary | ICD-10-CM | POA: Diagnosis not present

## 2022-07-18 ENCOUNTER — Telehealth: Payer: Self-pay

## 2022-07-18 NOTE — Telephone Encounter (Signed)
I spoke with Alyse Low at [facility] and she will fax 07/17/22 visit to Dr Glori Bickers attention to fax # [phone] at [facility] desk that will take incoming paper fax) before end of day today.sending note to Dr Glori Bickers and [place] CMA.

## 2022-07-18 NOTE — Telephone Encounter (Signed)
Called and spoke with patient advised pt of this information , pt will follow up  if needed

## 2022-07-18 NOTE — Telephone Encounter (Signed)
[place] Night - Client TELEPHONE ADVICE RECORD [facility] Patient Name: Carla Kelly Gender: Female DOB: 03/12/1933 Age: 88 Y 84 M 6 D Return Phone Number: [phone] (Primary), [phone] (Secondary) Address: City/ State/ Zip: [place]  [address] Client [place] Night - Client Client Site [place] Provider Glori Bickers, Roque Lias - MD Contact Type Call Who Is Calling Patient / Member / Family / Caregiver Call Type Triage / Clinical Caller Name Mat Carne Relationship To Patient Daughter Return Phone Number [phone] (Primary) Chief Complaint Leg Swelling And Edema Reason for Call Symptomatic / Request for [place] states her mother states she is having leg is weeping and swelling. [place] Not Listed next care Translation No Nurse Assessment Nurse: Velta Addison, RN, Helene Kelp Date/Time Eilene Ghazi Time): 07/17/2022 7:19:29 PM Confirm and document reason for call. If symptomatic, describe symptoms. ---Caller states that mother is having leg swelling and weeping fluid. Reports that she was seen yesterday and given instructions for leg swelling. Reports that today she has a small hole that is leaking clear fluid. Is just her right leg. Does the patient have any new or worsening symptoms? ---Yes Will a triage be completed? ---Yes Related visit to physician within the last 2 weeks? ---Yes Does the PT have any chronic conditions? (i.e. diabetes, asthma, this includes High risk factors for pregnancy, etc.) ---Yes List chronic conditions. ---cardiac, thyroid, asthma Is this a behavioral health or substance abuse call? ---No Guidelines Guideline Title Affirmed Question Affirmed Notes Nurse Date/Time Eilene Ghazi Time) Leg Swelling and Edema SEVERE leg swelling (e.g., swelling extends above knee, entire leg is swollen, weeping fluid) Velta Addison,  RN, Helene Kelp 07/17/2022 7:22:14 PM PLEASE NOTE: All timestamps contained within this report are represented as [place] Standard Time. CONFIDENTIALTY NOTICE: This fax transmission is intended only for the addressee. It contains information that is legally privileged, confidential or otherwise protected from use or disclosure. If you are not the intended recipient, you are strictly prohibited from reviewing, disclosing, copying using or disseminating any of this information or taking any action in reliance on or regarding this information. If you have received this fax in error, please notify us immediately by telephone so that we can arrange for its return to [place]. Phone: [phone], Toll-Free: [phone], Fax: [phone] Page: 2 of 2 Call Id: [id number] [place]. Time Eilene Ghazi Time) Disposition Final User 07/17/2022 7:27:04 PM See HCP within 4 Hours (or PCP triage) Yes Velta Addison, RN, Helene Kelp Final Disposition 07/17/2022 7:27:04 PM See HCP within 4 Hours (or PCP triage) Yes Velta Addison, RN, [place] Artist Disagree/Comply Comply Caller Understands Yes PreDisposition Did not know what to do Care Advice Given Per Guideline SEE HCP (OR PCP TRIAGE) WITHIN 4 HOURS: * IF OFFICE WILL BE OPEN: You need to be seen within the next 3 or 4 hours. Call your doctor (or NP/PA) now or as soon as the office opens. CARE ADVICE given per Leg Swelling and Edema (Adult) guideline. * You become worse CALL BACK IF: Referrals GO TO FACILITY OTHER - SPECIF

## 2022-07-18 NOTE — Telephone Encounter (Signed)
Called and spoke w/ pt she said that she is doing better and cream seems to be helping , she is going send a copy of avs to her my chart acct.

## 2022-07-18 NOTE — Telephone Encounter (Signed)
Thanks for the heads up. Please send for that note when you can.  At her visit we discussed use of compression garments for her swelling (she does not tolerate them well) - would she consider trying again?  Support socks or hose  Always elevate when sitting if she can  If redness or signs of infection let me know and I will rev note when I get it

## 2022-07-18 NOTE — Telephone Encounter (Signed)
I spoke with pts daughter (DPR signed)pt was seen at [facility] on 07/17/22.Marland Kitchen Mickel Baas said that PA was not overly concerned but did give pt an abx cream to keep area from getting infected. Mickel Baas to pick up med today. Pt has not gotten up yet but is awake and swelling has gone down in both legs overnight. No drainage from leg so far today. No CP or SOB. No redness or pain in leg or foot. Mickel Baas said after reviewed by Dr Glori Bickers request cb to see if any other suggestions. Appt offered but declined at this time until note reviewed by Dr Glori Bickers.UC & ED precautions given and pt voiced understanding. Pt last seen 07/16/22 for annual exam.sending note to Dr Glori Bickers and [place] CMA.

## 2022-07-18 NOTE — Telephone Encounter (Signed)
I reviewed the UC notes Use ointment  Compression garments as tolerated should help  If worse or any redness or pain follow up

## 2022-09-21 DIAGNOSIS — H524 Presbyopia: Secondary | ICD-10-CM | POA: Diagnosis not present

## 2022-10-11 ENCOUNTER — Telehealth: Payer: Self-pay | Admitting: Family Medicine

## 2022-10-11 DIAGNOSIS — M7742 Metatarsalgia, left foot: Secondary | ICD-10-CM | POA: Diagnosis not present

## 2022-10-11 DIAGNOSIS — M7741 Metatarsalgia, right foot: Secondary | ICD-10-CM | POA: Diagnosis not present

## 2022-10-11 DIAGNOSIS — M2041 Other hammer toe(s) (acquired), right foot: Secondary | ICD-10-CM | POA: Diagnosis not present

## 2022-10-11 NOTE — Telephone Encounter (Signed)
Called [facility] and they did have the Rx was that was sent in on 07/16/22 #90 w/ 3 refill and will get Rx ready for pt now. Pt advised

## 2022-10-11 NOTE — Telephone Encounter (Signed)
Patient called and stated she thought Dr. Glori Bickers sent refills for her medication diltiazem (CARDIZEM CD) 120 MG 24 hr capsule to  [id number] [address], [address] AT [address] Phone: [phone]  Fax: [phone]     In August but has not been refilled yet. Call back number [phone].

## 2022-10-12 ENCOUNTER — Telehealth: Payer: Self-pay | Admitting: Family Medicine

## 2022-10-12 NOTE — Telephone Encounter (Signed)
Patient called in and wanted to let Dr. Glori Bickers know that she received the latest Covid booster. She received it on 09/28/2022 and it was Moderna. Thank you!

## 2022-10-12 NOTE — Telephone Encounter (Signed)
I have updated pts immunizations.

## 2022-12-05 ENCOUNTER — Other Ambulatory Visit: Payer: Self-pay | Admitting: *Deleted

## 2022-12-05 MED ORDER — ALBUTEROL SULFATE 4 MG PO TABS: 4.0000 mg | ORAL_TABLET | ORAL | 5 refills | Status: AC | PRN

## 2022-12-09 ENCOUNTER — Other Ambulatory Visit: Payer: Self-pay | Admitting: Family Medicine

## 2022-12-26 ENCOUNTER — Ambulatory Visit (INDEPENDENT_AMBULATORY_CARE_PROVIDER_SITE_OTHER)
Admission: RE | Admit: 2022-12-26 | Discharge: 2022-12-26 | Disposition: A | Discharge status: Home / Self Care | Payer: Medicare Other | Source: Ambulatory Visit | Attending: Family Medicine | Admitting: Family Medicine

## 2022-12-26 ENCOUNTER — Encounter: Payer: Self-pay | Admitting: Family Medicine

## 2022-12-26 ENCOUNTER — Ambulatory Visit: Payer: Medicare Other | Admitting: Family Medicine

## 2022-12-26 VITALS — BP 136/70 | HR 83 | Temp 97.2°F | Ht 65.5 in | Wt 161.0 lb

## 2022-12-26 DIAGNOSIS — Z23 Encounter for immunization: Secondary | ICD-10-CM | POA: Diagnosis not present

## 2022-12-26 DIAGNOSIS — M79671 Pain in right foot: Secondary | ICD-10-CM | POA: Insufficient documentation

## 2022-12-26 DIAGNOSIS — M2041 Other hammer toe(s) (acquired), right foot: Secondary | ICD-10-CM | POA: Diagnosis not present

## 2022-12-26 NOTE — Progress Notes (Signed)
Subjective:    Patient ID: Carla Kelly, female    DOB: 05-Jul-1933, 89 y.o.   MRN: [id number]  HPI Pt presents for right foot pain  Also flu shot   She was taking a liver renew supplement - ? Why  Thinks it helps her stomach trouble  Some GERD issues  Issues with gas   Wt Readings from Last 3 Encounters:  12/26/22 161 lb (73 kg)  07/16/22 159 lb 12.8 oz (72.5 kg)  07/12/22 151 lb (68.5 kg)   26.38 kg/m  Uses an exercise bike  Wore some bedroom shoes when biking  That may have done it   Pain in medial foot /ankle Worried about a stress fracture  Started 2 weeks ago  Swelling the next day  A little bruising after that  No traumatic event   Hurts to pedal now  Some pain to bear weight  Hurt to point toe last night  Today hurts more to flex foot    H/o hammer toe  Also OP   Saw podiatry in November Dr Vickki Muff A/p from note Hammer toe of right foot Yes  Metatarsalgia of both feet   Plan: Contracture of the right fourth toe is causing a pressure induced lesion. We discussed accommodative measures she can perform. We discussed gel pads to the area as well as accommodation with her shoe gear. I was able to reduce some of the pressure and we discussed this today. I also outlined the possibility of surgical intervention. For now she will defer and she will follow-up as needed   Xray report today DG Ankle Complete Right  Result Date: 12/26/2022 CLINICAL DATA:  Pain in right foot and ankle, medial. EXAM: RIGHT ANKLE - COMPLETE 3+ VIEW COMPARISON:  None Available. FINDINGS: Diffuse soft tissue swelling surrounding the ankle. No visible acute fracture, dislocation or bone lesion. Mild degenerative disease of the ankle mortise. IMPRESSION: Diffuse soft tissue swelling without visible acute fracture. Mild degenerative disease of the ankle mortise. Electronically Signed   By: Aletta Edouard M.D.   On: 12/26/2022 13:01   DG Foot Complete Right  Result Date: 12/26/2022 CLINICAL  DATA:  Pain in right foot and ankle, medial. EXAM: RIGHT FOOT COMPLETE - 3+ VIEW COMPARISON:  Right foot films on 03/31/2018 FINDINGS: No acute fracture or dislocation identified. Stable chronic deformity of the fifth toe with bone loss of the proximal phalanx likely related to prior trauma. There is some progressive osteopenia since the prior x-ray in 2019. Mild hallux valgus. Soft tissues are unremarkable. IMPRESSION: No acute findings. Stable chronic deformity of the fifth toe with bone loss of the proximal phalanx likely related to prior trauma. Progressive osteopenia. Electronically Signed   By: Aletta Edouard M.D.   On: 12/26/2022 13:00     Patient Active Problem List   Diagnosis Date Noted   Right foot pain 12/26/2022   Aortic atherosclerosis ([place]) 04/20/2021   Strain of iliopsoas muscle, right, sequela 04/04/2021   History of atrial fibrillation 03/22/2021   Constipation 10/03/2020   Closed compression fracture of L1 vertebra ([place]) 09/26/2020   Hearing loss 07/06/2020   Hammer toe of right foot 03/12/2018   Screening mammogram, encounter for 03/04/2017   Routine general medical examination at a health care facility 02/12/2016   Estrogen deficiency 02/15/2015   Colon cancer screening 02/15/2015   Arthritis of right knee 08/13/2014   Status post total right knee replacement 08/13/2014   Transient global amnesia 02/20/2014   Encounter for Medicare  annual wellness exam 12/07/2013   Psoriasis 02/16/2013   Pedal edema 03/11/2012   B12 deficiency 09/05/2009   Vitamin D deficiency 11/08/2008   Hyperlipidemia 08/25/2008   Osteoporosis 08/25/2008   History of colonic polyps 08/25/2008   HERPES ZOSTER, HX OF 06/03/2008   Hypothyroidism 08/04/2007   HYPOGLYCEMIA 08/04/2007   ASTHMA 08/04/2007   IRRITABLE BOWEL SYNDROME 08/04/2007   VAGINITIS, ATROPHIC 08/04/2007   Osteoarthrosis, unspecified whether generalized or localized, unspecified site 08/04/2007   FIBROMYALGIA 08/04/2007    URINARY INCONTINENCE 08/04/2007   Asthma 08/04/2007   Past Medical History:  Diagnosis Date   Adenomatous colon polyp    Anemia    Asthma    Depression    Disorder of bone and cartilage, unspecified    Embolism and thrombosis of unspecified site    Fibromyalgia    History of herpes zoster    History of hiatal hernia    HLD (hyperlipidemia)    Hypoglycemia    Hypothyroidism    IBS (irritable bowel syndrome)    Mitral valve disorder    "leaking mitral valve-very mild"   Orbital floor fracture ([place]) 10/16   with a fall    Osteoarthritis    Osteoarthrosis, unspecified whether generalized or localized, unspecified site    Other B-complex deficiencies    Psoriasis    Stroke ([place])    Swelling of extremity, right    right leg   TGA (transient global amnesia) 01/2014   Unspecified urinary incontinence    Unspecified vitamin D deficiency    Past Surgical History:  Procedure Laterality Date   ABDOMINAL HYSTERECTOMY  1970   fibroids; endometriosis   APPENDECTOMY     bladder tack  5/05   BREAST BIOPSY Right 1995   BREAST CYST ASPIRATION Left 03/17/2015   CATARACT EXTRACTION Right 12/13   Dr George Ina   CATARACT EXTRACTION W/PHACO Left 05/10/2015   Procedure: CATARACT EXTRACTION PHACO AND INTRAOCULAR LENS PLACEMENT ([place]);  Surgeon: Birder Robson, MD;  Location: [facility] ORS;  Service: Ophthalmology;  Laterality: Left;  [place] 00:59 AP% 26.7 CDE 15.85 fluid pack lot#[id number]   CERVICAL DISC SURGERY     x 2   CHOLECYSTECTOMY  2004   COLONOSCOPY  2/02   polyps, BE-polyps   EYE SURGERY     right eye   JOINT REPLACEMENT     KYPHOPLASTY N/A 11/08/2020   Procedure: L1 Kyphoplasty;  Surgeon: Hessie Knows, MD;  Location: [facility] ORS;  Service: Orthopedics;  Laterality: N/A;   MOUTH SURGERY     lower teeth have 4 studs to keep in place   TONSILLECTOMY     TOTAL KNEE ARTHROPLASTY Right 08/13/2014   Procedure: RIGHT TOTAL KNEE ARTHROPLASTY;  Surgeon: Mcarthur Rossetti, MD;  Location: [facility];  Service: Orthopedics;  Laterality: Right;   TYMPANOPLASTY     US TRANSVAGINAL PELVIC MODIFIED  10/02   small left ovarian cyst   Social History   Tobacco Use   Smoking status: Never   Smokeless tobacco: Never  Vaping Use   Vaping Use: Never used  Substance Use Topics   Alcohol use: No    Comment: "is allergic"   Drug use: No   Family History  Problem Relation Age of Onset   Heart attack Mother    Stroke Mother        several   Lupus Brother    Other Sister        benign brain tumor   Asthma Daughter    Breast cancer Daughter  Other Brother        heart problems   Colon cancer Other        1st cousin   Allergies  Allergen Reactions   Alcohol-Sulfur [Elemental Sulfur] Shortness Of Breath and Swelling    Alcohol po swells throat   Ergocalciferol Other (See Comments)    REACTION: GI side effects   Morphine Nausea And Vomiting   Tolterodine Tartrate Other (See Comments)    REACTION: ? reaction   Alcohol Other (See Comments) and Swelling   Myrbetriq [Mirabegron] Other (See Comments)    GI Upset   Nortriptyline Hcl Other (See Comments)    unknown   Current Outpatient Medications on File Prior to Visit  Medication Sig Dispense Refill   albuterol (PROVENTIL) 4 MG tablet Take 1 tablet (4 mg total) by mouth every 4 (four) hours as needed for wheezing or shortness of breath. 30 tablet 5   Apoaequorin (PREVAGEN) 10 MG CAPS      aspirin 325 MG tablet Take 325 mg by mouth daily.     Calcium-Magnesium-Zinc (CAL-MAG-ZINC PO) Take 1 tablet by mouth in the morning, at noon, and at bedtime.     Cholecalciferol (VITAMIN D3 GUMMIES PO) Take 2 tablets by mouth daily.     diltiazem (CARDIZEM CD) 120 MG 24 hr capsule TAKE 1 CAPSULE(120 MG) BY MOUTH DAILY 90 capsule 3   furosemide (LASIX) 40 MG tablet Take 0.5-1 tablets (20-40 mg total) by mouth daily. 90 tablet 3   OVER THE COUNTER MEDICATION Take 1 capsule by mouth daily. Liver Renew     Probiotic Product (PROBIOTIC PO) Take  1 capsule by mouth daily.     SYNTHROID 88 MCG tablet TAKE 1 TABLET(88 MCG) BY MOUTH DAILY BEFORE BREAKFAST 90 tablet 3   No current facility-administered medications on file prior to visit.    Review of Systems  Constitutional:  Negative for activity change, appetite change, fatigue, fever and unexpected weight change.  HENT:  Negative for congestion, ear pain, rhinorrhea, sinus pressure and sore throat.   Eyes:  Negative for pain, redness and visual disturbance.  Respiratory:  Negative for cough, shortness of breath and wheezing.   Cardiovascular:  Negative for chest pain and palpitations.  Gastrointestinal:  Negative for abdominal pain, blood in stool, constipation and diarrhea.  Endocrine: Negative for polydipsia and polyuria.  Genitourinary:  Negative for dysuria, frequency and urgency.  Musculoskeletal:  Positive for arthralgias and back pain. Negative for myalgias.       Right foot pain   Skin:  Negative for pallor and rash.  Allergic/Immunologic: Negative for environmental allergies.  Neurological:  Negative for dizziness, syncope and headaches.  Hematological:  Negative for adenopathy. Does not bruise/bleed easily.  Psychiatric/Behavioral:  Negative for decreased concentration and dysphoric mood. The patient is not nervous/anxious.        Objective:   Physical Exam Constitutional:      General: She is not in acute distress.    Appearance: Normal appearance. She is well-developed and normal weight. She is not ill-appearing or diaphoretic.  HENT:     Head: Normocephalic and atraumatic.  Eyes:     Conjunctiva/sclera: Conjunctivae normal.     Pupils: Pupils are equal, round, and reactive to light.  Neck:     Thyroid: No thyromegaly.     Vascular: No carotid bruit or JVD.  Cardiovascular:     Rate and Rhythm: Normal rate and regular rhythm.     Heart sounds: Normal heart sounds.  No gallop.  Pulmonary:     Effort: Pulmonary effort is normal. No respiratory distress.   Abdominal:     General: There is no abdominal bruit.     Palpations: Abdomen is soft.  Musculoskeletal:     Cervical back: Normal range of motion and neck supple.     Right lower leg: No edema.     Left lower leg: No edema.     Right foot: Normal capillary refill. Swelling, bunion, prominent metatarsal heads and tenderness present. Normal pulse.     Comments: Right foot Mild swelling No ecchymosis  Tender over medial first MCP and also less over 4,5th mcp  Some tenderness ant to med malleolus  Nl rom but pain in full plantar and dorsi flexion  Pain to bear weight  Nl rom of toes    Lymphadenopathy:     Cervical: No cervical adenopathy.  Skin:    General: Skin is warm and dry.     Coloration: Skin is not pale.     Findings: No rash.  Neurological:     Mental Status: She is alert.     Sensory: No sensory deficit.     Motor: No weakness.     Coordination: Coordination normal.     Deep Tendon Reflexes: Reflexes are normal and symmetric. Reflexes normal.  Psychiatric:        Mood and Affect: Mood normal.           Assessment & Plan:   Problem List Items Addressed This Visit       Musculoskeletal and Integument   Hammer toe of right foot    Pt often wears bedroom slippers Rev pod note and agree with rec to offload area with gel insert if needed Recommend more supportive shoes         Other   Right foot pain - Primary    In pt with baseline hammer toe and contractures This occurred after using exercise bike in wrong shoes Now hurts to pedal and bear weight  Some proximal tenderness med more than lateral and in front of med malleolus  Mild swelling   Xray ordered-pend rad rev Enc use of cold compress if tolerated Well fitting structured shoes  Avoid activities that hurt  Want to r/u stress fx (she has OP)  Update if not starting to improve in a week or if worsening    Addendum -reassuring xr w/o fracture or dislocation  Recommend f/u with pod if symptoms  do not improve Trial of voltaren gel enc      Relevant Orders   DG Foot Complete Right (Completed)   DG Ankle Complete Right (Completed)

## 2022-12-26 NOTE — Assessment & Plan Note (Addendum)
In pt with baseline hammer toe and contractures This occurred after using exercise bike in wrong shoes Now hurts to pedal and bear weight  Some proximal tenderness med more than lateral and in front of med malleolus  Mild swelling   Xray ordered-pend rad rev Enc use of cold compress if tolerated Well fitting structured shoes  Avoid activities that hurt  Want to r/u stress fx (she has OP)  Update if not starting to improve in a week or if worsening    Addendum -reassuring xr w/o fracture or dislocation  Recommend f/u with pod if symptoms do not improve Trial of voltaren gel enc

## 2022-12-26 NOTE — Addendum Note (Signed)
Addended by: Tammi Sou on: 12/26/2022 02:10 PM   Modules accepted: Orders

## 2022-12-26 NOTE — Assessment & Plan Note (Signed)
Pt often wears bedroom slippers Rev pod note and agree with rec to offload area with gel insert if needed Recommend more supportive shoes

## 2022-12-26 NOTE — Patient Instructions (Addendum)
Wear your most supportive shoe Cold compress and voltaren gel may help Avoid activities and positions that hurt   Lets check an xray of foot and ankle   We will call with a result

## 2023-02-25 ENCOUNTER — Ambulatory Visit
Admission: EM | Admit: 2023-02-25 | Discharge: 2023-02-25 | Disposition: A | Discharge status: Home / Self Care | Payer: Medicare Other | Attending: Emergency Medicine | Admitting: Emergency Medicine

## 2023-02-25 DIAGNOSIS — L03115 Cellulitis of right lower limb: Secondary | ICD-10-CM

## 2023-02-25 DIAGNOSIS — L03116 Cellulitis of left lower limb: Secondary | ICD-10-CM | POA: Diagnosis not present

## 2023-02-25 DIAGNOSIS — R600 Localized edema: Secondary | ICD-10-CM

## 2023-02-25 MED ORDER — CEPHALEXIN 500 MG PO CAPS
500.0000 mg | ORAL_CAPSULE | Freq: Three times a day (TID) | ORAL | 0 refills | Status: AC
Start: 2023-02-25 — End: 2023-03-04

## 2023-02-25 NOTE — Discharge Instructions (Addendum)
Take the Keflex as directed.  Follow up with your primary care provider tomorrow.

## 2023-02-25 NOTE — ED Triage Notes (Signed)
Patient to Urgent Care with complaints of rash present to right and left lower legs that first appeared approx 5 days ago. Denies any known fevers.   Using mometasone furoate cream x3-4 days for her prescribed psoriasis. States that this did help take some of the heat away. Swelling has decreased from right ankle.

## 2023-02-25 NOTE — ED Provider Notes (Signed)
Carla Kelly    CSN: [id number] Arrival date & time: 02/25/23  1347      History   Chief Complaint Chief Complaint  Patient presents with   Rash    HPI Carla Kelly is a 89 y.o. female.  Accompanied by her daughter, patient presents with redness, swelling, and rash on both lower legs x 1 week, right is worse than left.  The swelling in her right lower leg is chronic since having knee surgery years ago.  Treating with mometasone cream.  She denies fever, open wounds, drainage, numbness, focal weakness, or other symptoms.     The history is provided by the patient, a relative and medical records.    Past Medical History:  Diagnosis Date   Adenomatous colon polyp    Anemia    Asthma    Depression    Disorder of bone and cartilage, unspecified    Embolism and thrombosis of unspecified site    Fibromyalgia    History of herpes zoster    History of hiatal hernia    HLD (hyperlipidemia)    Hypoglycemia    Hypothyroidism    IBS (irritable bowel syndrome)    Mitral valve disorder    "leaking mitral valve-very mild"   Orbital floor fracture 10/16   with a fall    Osteoarthritis    Osteoarthrosis, unspecified whether generalized or localized, unspecified site    Other B-complex deficiencies    Psoriasis    Stroke    Swelling of extremity, right    right leg   TGA (transient global amnesia) 01/2014   Unspecified urinary incontinence    Unspecified vitamin D deficiency     Patient Active Problem List   Diagnosis Date Noted   Right foot pain 12/26/2022   Aortic atherosclerosis 04/20/2021   Strain of iliopsoas muscle, right, sequela 04/04/2021   History of atrial fibrillation 03/22/2021   Constipation 10/03/2020   Closed compression fracture of L1 vertebra 09/26/2020   Hearing loss 07/06/2020   Hammer toe of right foot 03/12/2018   Screening mammogram, encounter for 03/04/2017   Routine general medical examination at a health care facility 02/12/2016    Estrogen deficiency 02/15/2015   Colon cancer screening 02/15/2015   Arthritis of right knee 08/13/2014   Status post total right knee replacement 08/13/2014   Transient global amnesia 02/20/2014   Encounter for Medicare annual wellness exam 12/07/2013   Psoriasis 02/16/2013   Pedal edema 03/11/2012   B12 deficiency 09/05/2009   Vitamin D deficiency 11/08/2008   Hyperlipidemia 08/25/2008   Osteoporosis 08/25/2008   History of colonic polyps 08/25/2008   HERPES ZOSTER, HX OF 06/03/2008   Hypothyroidism 08/04/2007   HYPOGLYCEMIA 08/04/2007   ASTHMA 08/04/2007   IRRITABLE BOWEL SYNDROME 08/04/2007   VAGINITIS, ATROPHIC 08/04/2007   Osteoarthrosis, unspecified whether generalized or localized, unspecified site 08/04/2007   FIBROMYALGIA 08/04/2007   URINARY INCONTINENCE 08/04/2007   Asthma 08/04/2007    Past Surgical History:  Procedure Laterality Date   ABDOMINAL HYSTERECTOMY  1970   fibroids; endometriosis   APPENDECTOMY     bladder tack  5/05   BREAST BIOPSY Right 1995   BREAST CYST ASPIRATION Left 03/17/2015   CATARACT EXTRACTION Right 12/13   Dr George Ina   CATARACT EXTRACTION W/PHACO Left 05/10/2015   Procedure: CATARACT EXTRACTION PHACO AND INTRAOCULAR LENS PLACEMENT ([place]);  Surgeon: Birder Robson, MD;  Location: [facility];  Service: Ophthalmology;  Laterality: Left;  [place] 00:59 AP% 26.7 CDE 15.85 fluid pack lot#[id number]  CERVICAL DISC SURGERY     x 2   CHOLECYSTECTOMY  2004   COLONOSCOPY  2/02   polyps, BE-polyps   EYE SURGERY     right eye   JOINT REPLACEMENT     KYPHOPLASTY N/A 11/08/2020   Procedure: L1 Kyphoplasty;  Surgeon: Hessie Knows, MD;  Location: [facility] OR[facility];  Service: Orthopedics;  Laterality: N/A;   MOUTH SURGERY     lower teeth have 4 studs to keep in place   TONSILLECTOMY     TOTAL KNEE ARTHROPLASTY Right 08/13/2014   Procedure: RIGHT TOTAL KNEE ARTHROPLASTY;  Surgeon: Mcarthur Rossetti, MD;  Location: [facility];  Service: Orthopedics;   Laterality: Right;   TYMPANOPLASTY     US TRANSVAGINAL PELVIC MODIFIED  10/02   small left ovarian cyst    OB History   No obstetric history on file.      Home Medications    Prior to Admission medications   Medication Sig Start Date End Date Taking? Authorizing Provider  cephALEXin (KEFLEX) 500 MG capsule Take 1 capsule (500 mg total) by mouth 3 (three) times daily for 7 days. 02/25/23 03/04/23 Yes Sharion Balloon, NP  albuterol (PROVENTIL) 4 MG tablet Take 1 tablet (4 mg total) by mouth every 4 (four) hours as needed for wheezing or shortness of breath. 12/05/22   Tower, Wynelle Fanny, MD  Apoaequorin (PREVAGEN) 10 MG CAPS  07/19/17   [provider]  aspirin 325 MG tablet Take 325 mg by mouth daily.    [provider]  Calcium-Magnesium-Zinc (CAL-MAG-ZINC PO) Take 1 tablet by mouth in the morning, at noon, and at bedtime.    [provider]  Cholecalciferol (VITAMIN D3 GUMMIES PO) Take 2 tablets by mouth daily. 03/10/18   [provider]  diltiazem (CARDIZEM CD) 120 MG 24 hr capsule TAKE 1 CAPSULE(120 MG) BY MOUTH DAILY 07/16/22   Tower, Wynelle Fanny, MD  furosemide (LASIX) 40 MG tablet Take 0.5-1 tablets (20-40 mg total) by mouth daily. 07/16/22   Tower, Wynelle Fanny, MD  OVER THE COUNTER MEDICATION Take 1 capsule by mouth daily. Liver Renew    [provider]  Probiotic Product (PROBIOTIC PO) Take 1 capsule by mouth daily.    [provider]  SYNTHROID 88 MCG tablet TAKE 1 TABLET(88 MCG) BY MOUTH DAILY BEFORE BREAKFAST 07/16/22   Tower, Wynelle Fanny, MD    Family History Family History  Problem Relation Age of Onset   Heart attack Mother    Stroke Mother        several   Lupus Brother    Other Sister        benign brain tumor   Asthma Daughter    Breast cancer Daughter    Other Brother        heart problems   Colon cancer Other        1st cousin    Social History Social History   Tobacco Use   Smoking status: Never   Smokeless tobacco:  Never  Vaping Use   Vaping Use: Never used  Substance Use Topics   Alcohol use: No    Comment: "is allergic"   Drug use: No     Allergies   Alcohol-sulfur [elemental sulfur], Ergocalciferol, Morphine, Tolterodine tartrate, Alcohol, Myrbetriq [mirabegron], and Nortriptyline hcl   Review of Systems Review of Systems  Constitutional:  Negative for chills and fever.  Musculoskeletal:  Negative for gait problem.  Skin:  Positive for color change and rash.  Neurological:  Negative for weakness and numbness.  All other systems reviewed and are negative.    Physical Exam Triage Vital Signs ED Triage Vitals  Enc Vitals Group     BP 02/25/23 1426 136/78     Pulse Rate 02/25/23 1426 74     Resp 02/25/23 1426 16     Temp 02/25/23 1426 98 F (36.7 C)     Temp Source 02/25/23 1426 Oral     SpO2 02/25/23 1426 98 %     Weight --      Height --      Head Circumference --      Peak Flow --      Pain Score 02/25/23 1423 4     Pain Loc --      Pain Edu? --      Excl. in [place]? --    No data found.  Updated Vital Signs BP 136/78   Pulse 74   Temp 98 F (36.7 C) (Oral)   Resp 16   SpO2 98%   Visual Acuity Right Eye Distance:   Left Eye Distance:   Bilateral Distance:    Right Eye Near:   Left Eye Near:    Bilateral Near:     Physical Exam Vitals and nursing note reviewed.  Constitutional:      General: She is not in acute distress.    Appearance: Normal appearance. She is well-developed. She is not ill-appearing.  HENT:     Mouth/Throat:     Mouth: Mucous membranes are moist.  Cardiovascular:     Rate and Rhythm: Normal rate and regular rhythm.  Pulmonary:     Effort: Pulmonary effort is normal. No respiratory distress.  Musculoskeletal:        General: Swelling present. No tenderness or deformity. Normal range of motion.     Cervical back: Neck supple.     Comments: RLE edematous below knee.   Skin:    General: Skin is warm and dry.     Capillary Refill:  Capillary refill takes less than 2 seconds.     Findings: Erythema and rash present.     Comments: RLE is erythematous from mid-calf to ankle, worse posteriorly.  LLE is erythematous posteriorly just above the ankle and no edema.  No open wounds or drainage.    Neurological:     General: No focal deficit present.     Mental Status: She is alert and oriented to person, place, and time.     Sensory: No sensory deficit.     Motor: No weakness.     Comments: Ambulatory with walker.    Psychiatric:        Mood and Affect: Mood normal.        Behavior: Behavior normal.      UC Treatments / Results  Labs (all labs ordered are listed, but only abnormal results are displayed) Labs Reviewed - No data to display  EKG   Radiology No results found.  Procedures Procedures (including critical care time)  Medications Ordered in UC Medications - No data to display  Initial Impression / Assessment and Plan / UC Course  I have reviewed the triage vital signs and the nursing notes.  Pertinent labs & imaging results that were available during my care of the patient were reviewed by me and considered in my medical decision making (see chart for details).    Cellulitis of lower legs.  Afebrile, VSS.  RLE is edematous but patient states this is chronic  and has improved today.  She has erythema on both lower legs, right worse than left.  Treating with Keflex.  Discussed elevation and follow up with PCP tomorrow.  Patient and her daughter agree to plan of care.    Final Clinical Impressions(s) / UC Diagnoses   Final diagnoses:  Bilateral cellulitis of lower leg  Edema of right lower leg     Discharge Instructions      Take the Keflex as directed.  Follow up with your primary care provider tomorrow.        ED Prescriptions     Medication Sig Dispense Auth. Provider   cephALEXin (KEFLEX) 500 MG capsule Take 1 capsule (500 mg total) by mouth 3 (three) times daily for 7 days. 21  capsule Sharion Balloon, NP      PDMP not reviewed this encounter.   Sharion Balloon, NP 02/25/23 1501

## 2023-03-01 ENCOUNTER — Ambulatory Visit: Payer: Medicare Other | Admitting: Family Medicine

## 2023-03-01 ENCOUNTER — Encounter: Payer: Self-pay | Admitting: Family Medicine

## 2023-03-01 VITALS — BP 134/76 | HR 75 | Temp 97.6°F | Ht 65.5 in | Wt 164.0 lb

## 2023-03-01 DIAGNOSIS — L03115 Cellulitis of right lower limb: Secondary | ICD-10-CM

## 2023-03-01 DIAGNOSIS — R600 Localized edema: Secondary | ICD-10-CM

## 2023-03-01 DIAGNOSIS — L84 Corns and callosities: Secondary | ICD-10-CM | POA: Diagnosis not present

## 2023-03-01 DIAGNOSIS — L03119 Cellulitis of unspecified part of limb: Secondary | ICD-10-CM | POA: Insufficient documentation

## 2023-03-01 NOTE — Patient Instructions (Signed)
Finish the generic keflex antibiotic Elevate foot when you sit  Start wearing support hose/socks during the day   Warm compress is helpful if needed    Update if not starting to improve in a week or if worsening    See the podiatrist about the corn on your toe

## 2023-03-01 NOTE — Assessment & Plan Note (Signed)
Top of R 4th toe (hammar toe)  Likely needs to be pared down again  She wears wide and tall shoe Has podiatry appt planned

## 2023-03-01 NOTE — Assessment & Plan Note (Signed)
Primarily on R  Had knee repl on that side Getting over cellulitis now   Stressed imp of supp stockings/socks during the day

## 2023-03-01 NOTE — Assessment & Plan Note (Signed)
Primarily R leg Seen at [facility] on Monday-note and plan reviewed  Much improvement with keflex and elevation  Picture taken  L leg had scant redness-is resolved  She has baseline edema after knee sur in past on R Enc her strongly to start wearing supp hose or sock regularly during the day  She has both and will get on track Inst to finish the keflex Elevate Warm compress if needed  Update if not starting to improve in a week or if worsening

## 2023-03-01 NOTE — Progress Notes (Signed)
Subjective:    Patient ID: Carla Kelly, female    DOB: Nov 14, 1933, 89 y.o.   MRN: [id number]  HPI Pt presents for urgent care f/u of bilateral LE cellulitis   Wt Readings from Last 3 Encounters:  03/01/23 164 lb (74.4 kg)  12/26/22 161 lb (73 kg)  07/16/22 159 lb 12.8 oz (72.5 kg)   26.88 kg/m Vitals:   03/01/23 1414  BP: 134/76  Pulse: 75  Temp: 97.6 F (36.4 C)  SpO2: 98%     [facility] [place]   Presented with 1 wk of redness and swelling of lower legs , right worse than left  Has some baseline R leg swelling after knee surg years ago   Was px keflex  Is doing some better  Less red and swollen   Originally thought it was psoriasis and was using mometasone originally   Has a corn on her toe  4th toe R foot  Sees Dr Ether GriffinsFowler for podiatry    Grandson passed away in the interim from drug OD/fentanyl   Patient Active Problem List   Diagnosis Date Noted   Cellulitis, leg 03/01/2023   Corn of toe 03/01/2023   Right foot pain 12/26/2022   Aortic atherosclerosis 04/20/2021   Strain of iliopsoas muscle, right, sequela 04/04/2021   History of atrial fibrillation 03/22/2021   Constipation 10/03/2020   Closed compression fracture of L1 vertebra 09/26/2020   Hearing loss 07/06/2020   Hammer toe of right foot 03/12/2018   Screening mammogram, encounter for 03/04/2017   Routine general medical examination at a health care facility 02/12/2016   Estrogen deficiency 02/15/2015   Colon cancer screening 02/15/2015   Arthritis of right knee 08/13/2014   Status post total right knee replacement 08/13/2014   Transient global amnesia 02/20/2014   Encounter for Medicare annual wellness exam 12/07/2013   Psoriasis 02/16/2013   Pedal edema 03/11/2012   B12 deficiency 09/05/2009   Vitamin D deficiency 11/08/2008   Hyperlipidemia 08/25/2008   Osteoporosis 08/25/2008   History of colonic polyps 08/25/2008   HERPES ZOSTER, HX OF 06/03/2008    Hypothyroidism 08/04/2007   HYPOGLYCEMIA 08/04/2007   ASTHMA 08/04/2007   IRRITABLE BOWEL SYNDROME 08/04/2007   VAGINITIS, ATROPHIC 08/04/2007   Osteoarthrosis, unspecified whether generalized or localized, unspecified site 08/04/2007   FIBROMYALGIA 08/04/2007   URINARY INCONTINENCE 08/04/2007   Asthma 08/04/2007   Past Medical History:  Diagnosis Date   Adenomatous colon polyp    Anemia    Asthma    Depression    Disorder of bone and cartilage, unspecified    Embolism and thrombosis of unspecified site    Fibromyalgia    History of herpes zoster    History of hiatal hernia    HLD (hyperlipidemia)    Hypoglycemia    Hypothyroidism    IBS (irritable bowel syndrome)    Mitral valve disorder    "leaking mitral valve-very mild"   Orbital floor fracture 10/16   with a fall    Osteoarthritis    Osteoarthrosis, unspecified whether generalized or localized, unspecified site    Other B-complex deficiencies    Psoriasis    Stroke    Swelling of extremity, right    right leg   TGA (transient global amnesia) 01/2014   Unspecified urinary incontinence    Unspecified vitamin D deficiency    Past Surgical History:  Procedure Laterality Date   ABDOMINAL HYSTERECTOMY  1970   fibroids; endometriosis  APPENDECTOMY     bladder tack  5/05   BREAST BIOPSY Right 1995   BREAST CYST ASPIRATION Left 03/17/2015   CATARACT EXTRACTION Right 12/13   Dr Druscilla Brownie   CATARACT EXTRACTION W/PHACO Left 05/10/2015   Procedure: CATARACT EXTRACTION PHACO AND INTRAOCULAR LENS PLACEMENT (IOC);  Surgeon: Galen Manila, MD;  Location: [facility];  Service: Ophthalmology;  Laterality: Left;  [place] 00:59 AP% 26.7 CDE 15.85 fluid pack lot#[id number] H   CERVICAL DISC SURGERY     x 2   CHOLECYSTECTOMY  2004   COLONOSCOPY  2/02   polyps, BE-polyps   EYE SURGERY     right eye   JOINT REPLACEMENT     KYPHOPLASTY N/A 11/08/2020   Procedure: L1 Kyphoplasty;  Surgeon: Kennedy Bucker, MD;  Location: [facility];   Service: Orthopedics;  Laterality: N/A;   MOUTH SURGERY     lower teeth have 4 studs to keep in place   TONSILLECTOMY     TOTAL KNEE ARTHROPLASTY Right 08/13/2014   Procedure: RIGHT TOTAL KNEE ARTHROPLASTY;  Surgeon: Kathryne Hitch, MD;  Location: [facility];  Service: Orthopedics;  Laterality: Right;   TYMPANOPLASTY     US TRANSVAGINAL PELVIC MODIFIED  10/02   small left ovarian cyst   Social History   Tobacco Use   Smoking status: Never   Smokeless tobacco: Never  Vaping Use   Vaping Use: Never used  Substance Use Topics   Alcohol use: No    Comment: "is allergic"   Drug use: No   Family History  Problem Relation Age of Onset   Heart attack Mother    Stroke Mother        several   Lupus Brother    Other Sister        benign brain tumor   Asthma Daughter    Breast cancer Daughter    Other Brother        heart problems   Colon cancer Other        1st cousin   Allergies  Allergen Reactions   Alcohol-Sulfur [Elemental Sulfur] Shortness Of Breath and Swelling    Alcohol po swells throat   Ergocalciferol Other (See Comments)    REACTION: GI side effects   Morphine Nausea And Vomiting   Tolterodine Tartrate Other (See Comments)    REACTION: ? reaction   Alcohol Other (See Comments) and Swelling   Myrbetriq [Mirabegron] Other (See Comments)    GI Upset   Nortriptyline Hcl Other (See Comments)    unknown   Current Outpatient Medications on File Prior to Visit  Medication Sig Dispense Refill   albuterol (PROVENTIL) 4 MG tablet Take 1 tablet (4 mg total) by mouth every 4 (four) hours as needed for wheezing or shortness of breath. 30 tablet 5   Apoaequorin (PREVAGEN) 10 MG CAPS      aspirin 325 MG tablet Take 325 mg by mouth daily.     Calcium-Magnesium-Zinc (CAL-MAG-ZINC PO) Take 1 tablet by mouth in the morning, at noon, and at bedtime.     cephALEXin (KEFLEX) 500 MG capsule Take 1 capsule (500 mg total) by mouth 3 (three) times daily for 7 days. 21 capsule 0    Cholecalciferol (VITAMIN D3 GUMMIES PO) Take 2 tablets by mouth daily.     diltiazem (CARDIZEM CD) 120 MG 24 hr capsule TAKE 1 CAPSULE(120 MG) BY MOUTH DAILY 90 capsule 3   furosemide (LASIX) 40 MG tablet Take 0.5-1 tablets (20-40 mg total) by mouth daily. 90 tablet 3  Probiotic Product (PROBIOTIC PO) Take 1 capsule by mouth daily.     SYNTHROID 88 MCG tablet TAKE 1 TABLET(88 MCG) BY MOUTH DAILY BEFORE BREAKFAST 90 tablet 3   No current facility-administered medications on file prior to visit.    Review of Systems  Constitutional:  Positive for fatigue. Negative for activity change, appetite change and fever.  Cardiovascular:  Positive for leg swelling. Negative for chest pain and palpitations.  Musculoskeletal:  Positive for arthralgias and back pain.  Skin:  Positive for color change.  Psychiatric/Behavioral:         Grief  Lost a grand child        Objective:   Physical Exam Constitutional:      General: She is not in acute distress.    Appearance: Normal appearance. She is normal weight. She is not ill-appearing.  Eyes:     General:        Right eye: No discharge.        Left eye: No discharge.     Conjunctiva/sclera: Conjunctivae normal.     Pupils: Pupils are equal, round, and reactive to light.  Neck:     Vascular: No carotid bruit.  Cardiovascular:     Rate and Rhythm: Normal rate and regular rhythm.     Heart sounds: Normal heart sounds.  Pulmonary:     Effort: Pulmonary effort is normal. No respiratory distress.     Breath sounds: Normal breath sounds. No wheezing or rales.     Comments: No crackles Musculoskeletal:     Cervical back: Neck supple.     Comments: RLE is more swollen than left One plus pitting  Evidence of venous insufficiency Erythema is very light over lower shin -per pt much improved    1 cm corn on top of R 4th hammer toe Mildly tender  No open areas   Lymphadenopathy:     Cervical: No cervical adenopathy.  Skin:    General: Skin is  warm.     Coloration: Skin is not jaundiced.     Findings: Erythema present. No bruising or rash.  Neurological:     Mental Status: She is alert.     Sensory: No sensory deficit.  Psychiatric:        Mood and Affect: Mood normal.           Assessment & Plan:   Problem List Items Addressed This Visit       Musculoskeletal and Integument   Corn of toe    Top of R 4th toe (hammar toe)  Likely needs to be pared down again  She wears wide and tall shoe Has podiatry appt planned           Other   Cellulitis, leg - Primary    Primarily R leg Seen at [facility] on Monday-note and plan reviewed  Much improvement with keflex and elevation  Picture taken  L leg had scant redness-is resolved  She has baseline edema after knee sur in past on R Enc her strongly to start wearing supp hose or sock regularly during the day  She has both and will get on track Inst to finish the keflex Elevate Warm compress if needed  Update if not starting to improve in a week or if worsening        Leg edema    Primarily on R  Had knee repl on that side Getting over cellulitis now   Stressed imp of supp stockings/socks during the day

## 2023-04-10 ENCOUNTER — Telehealth: Payer: Self-pay | Admitting: Family Medicine

## 2023-04-10 NOTE — Telephone Encounter (Signed)
Prescription Request  04/10/2023  LOV: 03/01/2023  What is the name of the medication or equipment? cephALEXin (KEFLEX) 500 MG capsule   Have you contacted your pharmacy to request a refill? No   Which pharmacy would you like this sent to?  [id number] [address] [place] [address] Phone: [phone] Fax: [phone]    Patient notified that their request is being sent to the clinical staff for review and that they should receive a response within 2 business days.   Please advise at Mobile [phone] (mobile)

## 2023-04-10 NOTE — Telephone Encounter (Signed)
We don't prescribe antibiotics without an appt. If pt thinks she needs an antibiotic she needs to schedule an appt with a provider

## 2023-04-10 NOTE — Telephone Encounter (Signed)
Patient has been scheduled. She stated that it was prescribed at the urgent care for cellulitis.

## 2023-04-10 NOTE — Telephone Encounter (Signed)
Per Dr. Milinda Antis this has to be an in office apt not virtual please call pt and schedule in office appt or change appt to in office and make sure pt is aware can't be a video visit

## 2023-04-10 NOTE — Telephone Encounter (Signed)
I called pt and changed it to in office appt

## 2023-04-11 ENCOUNTER — Encounter: Payer: Self-pay | Admitting: Family Medicine

## 2023-04-11 ENCOUNTER — Ambulatory Visit: Payer: Medicare Other | Admitting: Family Medicine

## 2023-04-11 VITALS — BP 130/70 | HR 85 | Temp 97.5°F | Ht 65.5 in | Wt 163.2 lb

## 2023-04-11 DIAGNOSIS — L03115 Cellulitis of right lower limb: Secondary | ICD-10-CM

## 2023-04-11 DIAGNOSIS — L84 Corns and callosities: Secondary | ICD-10-CM | POA: Diagnosis not present

## 2023-04-11 DIAGNOSIS — R600 Localized edema: Secondary | ICD-10-CM | POA: Diagnosis not present

## 2023-04-11 NOTE — Assessment & Plan Note (Signed)
This looks to be greatly improved R leg Swelling/lymphedema persists  Slightly pink No weeping or open areas  Will not repeat abx Discussed signs and symptoms of infection and call if they develop Discussed elevation and wrapping/supp stocking

## 2023-04-11 NOTE — Assessment & Plan Note (Signed)
Worse on the R Suspect some venous insuff and some lymphedema  Cannot wear her supp hose on R foot due to a corn (planning pod appt to remove) Px given for 15-20 mm Hg support hose to knee (toe less) For now-wrapped in ace bandage-discussed wrapping from bottom up and wear during the day  I do not think there is infx at this time but will watch for sighs and symptoms  ER precautions given

## 2023-04-11 NOTE — Assessment & Plan Note (Signed)
Very sensitive large corn on R 4th toe  Needs podiatrist appt to treat  Preventing her from wearing hose

## 2023-04-11 NOTE — Patient Instructions (Addendum)
Get an appt with the podiatrist asap to the the corn taken care of   Keep some compression on your legs We can try and get support socks without toes if possible    Elevate feet when you sit  Wear support garments during the day and take off to sleep  If worse redness/ pain / warmth or any drainage- call and let us know    I don't think infection is a problem now but keep [place] posted   Try the ace bandage in the meantime for compression

## 2023-04-11 NOTE — Progress Notes (Signed)
Subjective:    Patient ID: Carla Kelly, female    DOB: 02-17-1933, 89 y.o.   MRN: [id number]  HPI Pt presents for f/u of cellulitis of leg  Wt Readings from Last 3 Encounters:  04/11/23 163 lb 4 oz (74 kg)  03/01/23 164 lb (74.4 kg)  12/26/22 161 lb (73 kg)   26.75 kg/m  Vitals:   04/11/23 1155  BP: (!) 142/78  Pulse: 85  Temp: (!) 97.5 F (36.4 C)  SpO2: 96%    Was prevention therapeutic with keflex and improved  Seen here on 4/5 much improved  Instructed to elevate and use warm compresses   Still some pink color on her R leg  Never got 100% better Has baseline swelling   Not very painful  Is tight feeling Not hot  No open areas  No weeping   Uses bactroban if she has any open areas   Patient Active Problem List   Diagnosis Date Noted   Cellulitis, leg 03/01/2023   Corn of toe 03/01/2023   Right foot pain 12/26/2022   Aortic atherosclerosis (HCC) 04/20/2021   Strain of iliopsoas muscle, right, sequela 04/04/2021   History of atrial fibrillation 03/22/2021   Constipation 10/03/2020   Closed compression fracture of L1 vertebra (HCC) 09/26/2020   Hearing loss 07/06/2020   Hammer toe of right foot 03/12/2018   Screening mammogram, encounter for 03/04/2017   Routine general medical examination at a health care facility 02/12/2016   Estrogen deficiency 02/15/2015   Colon cancer screening 02/15/2015   Arthritis of right knee 08/13/2014   Status post total right knee replacement 08/13/2014   Transient global amnesia 02/20/2014   Encounter for Medicare annual wellness exam 12/07/2013   Psoriasis 02/16/2013   Leg edema 03/11/2012   B12 deficiency 09/05/2009   Vitamin D deficiency 11/08/2008   Hyperlipidemia 08/25/2008   Osteoporosis 08/25/2008   History of colonic polyps 08/25/2008   HERPES ZOSTER, HX OF 06/03/2008   Hypothyroidism 08/04/2007   HYPOGLYCEMIA 08/04/2007   ASTHMA 08/04/2007   IRRITABLE BOWEL SYNDROME 08/04/2007   VAGINITIS, ATROPHIC  08/04/2007   Osteoarthrosis, unspecified whether generalized or localized, unspecified site 08/04/2007   FIBROMYALGIA 08/04/2007   URINARY INCONTINENCE 08/04/2007   Asthma 08/04/2007   Past Medical History:  Diagnosis Date   Adenomatous colon polyp    Anemia    Asthma    Depression    Disorder of bone and cartilage, unspecified    Embolism and thrombosis of unspecified site    Fibromyalgia    History of herpes zoster    History of hiatal hernia    HLD (hyperlipidemia)    Hypoglycemia    Hypothyroidism    IBS (irritable bowel syndrome)    Mitral valve disorder    "leaking mitral valve-very mild"   Orbital floor fracture (HCC) 10/16   with a fall    Osteoarthritis    Osteoarthrosis, unspecified whether generalized or localized, unspecified site    Other B-complex deficiencies    Psoriasis    Stroke (HCC)    Swelling of extremity, right    right leg   TGA (transient global amnesia) 01/2014   Unspecified urinary incontinence    Unspecified vitamin D deficiency    Past Surgical History:  Procedure Laterality Date   ABDOMINAL HYSTERECTOMY  1970   fibroids; endometriosis   APPENDECTOMY     bladder tack  5/05   BREAST BIOPSY Right 1995   BREAST CYST ASPIRATION Left 03/17/2015   CATARACT EXTRACTION  Right 12/13   Dr Druscilla Brownie   CATARACT EXTRACTION W/PHACO Left 05/10/2015   Procedure: CATARACT EXTRACTION PHACO AND INTRAOCULAR LENS PLACEMENT (IOC);  Surgeon: Galen Manila, MD;  Location: [facility];  Service: Ophthalmology;  Laterality: Left;  [place] 00:59 AP% 26.7 CDE 15.85 fluid pack lot#[id number] H   CERVICAL DISC SURGERY     x 2   CHOLECYSTECTOMY  2004   COLONOSCOPY  2/02   polyps, BE-polyps   EYE SURGERY     right eye   JOINT REPLACEMENT     KYPHOPLASTY N/A 11/08/2020   Procedure: L1 Kyphoplasty;  Surgeon: Kennedy Bucker, MD;  Location: [facility];  Service: Orthopedics;  Laterality: N/A;   MOUTH SURGERY     lower teeth have 4 studs to keep in place   TONSILLECTOMY      TOTAL KNEE ARTHROPLASTY Right 08/13/2014   Procedure: RIGHT TOTAL KNEE ARTHROPLASTY;  Surgeon: Kathryne Hitch, MD;  Location: [facility];  Service: Orthopedics;  Laterality: Right;   TYMPANOPLASTY     US TRANSVAGINAL PELVIC MODIFIED  10/02   small left ovarian cyst   Social History   Tobacco Use   Smoking status: Never   Smokeless tobacco: Never  Vaping Use   Vaping Use: Never used  Substance Use Topics   Alcohol use: No    Comment: "is allergic"   Drug use: No   Family History  Problem Relation Age of Onset   Heart attack Mother    Stroke Mother        several   Lupus Brother    Other Sister        benign brain tumor   Asthma Daughter    Breast cancer Daughter    Other Brother        heart problems   Colon cancer Other        1st cousin   Allergies  Allergen Reactions   Alcohol-Sulfur [Elemental Sulfur] Shortness Of Breath and Swelling    Alcohol po swells throat   Ergocalciferol Other (See Comments)    REACTION: GI side effects   Morphine Nausea And Vomiting   Tolterodine Tartrate Other (See Comments)    REACTION: ? reaction   Alcohol Other (See Comments) and Swelling   Myrbetriq [Mirabegron] Other (See Comments)    GI Upset   Nortriptyline Hcl Other (See Comments)    unknown   Current Outpatient Medications on File Prior to Visit  Medication Sig Dispense Refill   albuterol (PROVENTIL) 4 MG tablet Take 1 tablet (4 mg total) by mouth every 4 (four) hours as needed for wheezing or shortness of breath. 30 tablet 5   Apoaequorin (PREVAGEN) 10 MG CAPS      aspirin 325 MG tablet Take 325 mg by mouth daily.     Calcium-Magnesium-Zinc (CAL-MAG-ZINC PO) Take 1 tablet by mouth in the morning, at noon, and at bedtime.     Cholecalciferol (VITAMIN D3 GUMMIES PO) Take 2 tablets by mouth daily.     diltiazem (CARDIZEM CD) 120 MG 24 hr capsule TAKE 1 CAPSULE(120 MG) BY MOUTH DAILY 90 capsule 3   furosemide (LASIX) 40 MG tablet Take 0.5-1 tablets (20-40 mg total) by mouth  daily. 90 tablet 3   mupirocin ointment (BACTROBAN) 2 % Apply 1 Application topically daily.     Probiotic Product (PROBIOTIC PO) Take 1 capsule by mouth daily.     SYNTHROID 88 MCG tablet TAKE 1 TABLET(88 MCG) BY MOUTH DAILY BEFORE BREAKFAST 90 tablet 3   No current facility-administered  medications on file prior to visit.    Review of Systems  Constitutional:  Negative for activity change, appetite change, fatigue, fever and unexpected weight change.  HENT:  Negative for congestion, ear pain, rhinorrhea, sinus pressure and sore throat.   Eyes:  Negative for pain, redness and visual disturbance.  Respiratory:  Negative for cough, shortness of breath and wheezing.   Cardiovascular:  Positive for leg swelling. Negative for chest pain and palpitations.  Gastrointestinal:  Negative for abdominal pain, blood in stool, constipation and diarrhea.  Endocrine: Negative for polydipsia and polyuria.  Genitourinary:  Negative for dysuria, frequency and urgency.  Musculoskeletal:  Negative for arthralgias, back pain and myalgias.  Skin:  Negative for pallor and rash.       Corn on toe is painful  Allergic/Immunologic: Negative for environmental allergies.  Neurological:  Negative for dizziness, syncope and headaches.  Hematological:  Negative for adenopathy. Does not bruise/bleed easily.  Psychiatric/Behavioral:  Negative for decreased concentration and dysphoric mood. The patient is not nervous/anxious.        Objective:   Physical Exam Constitutional:      General: She is not in acute distress.    Appearance: Normal appearance. She is normal weight. She is not ill-appearing or diaphoretic.  Eyes:     Conjunctiva/sclera: Conjunctivae normal.     Pupils: Pupils are equal, round, and reactive to light.  Cardiovascular:     Rate and Rhythm: Normal rate and regular rhythm.     Pulses: Normal pulses.  Abdominal:     Hernia: No hernia is present.  Musculoskeletal:     Right lower leg: Edema  present.     Comments: Pedal edama much worse on R Few varicosities Skin is pink/not warm and not tender No weeping No open areas  No palp cords   Wrapped in ace bandage today   Skin:    Findings: Erythema present.     Comments: Scant erythema R shin No open areas or weeping   Corn on R 4th toe is over 5 mm and tender to touch     Neurological:     Mental Status: She is alert.  Psychiatric:        Mood and Affect: Mood normal.           Assessment & Plan:   Problem List Items Addressed This Visit       Musculoskeletal and Integument   Corn of toe    Very sensitive large corn on R 4th toe  Needs podiatrist appt to treat  Preventing her from wearing hose        Other   Cellulitis, leg    This looks to be greatly improved R leg Swelling/lymphedema persists  Slightly pink No weeping or open areas  Will not repeat abx Discussed signs and symptoms of infection and call if they develop Discussed elevation and wrapping/supp stocking      Leg edema - Primary    Worse on the R Suspect some venous insuff and some lymphedema  Cannot wear her supp hose on R foot due to a corn (planning pod appt to remove) Px given for 15-20 mm Hg support hose to knee (toe less) For now-wrapped in ace bandage-discussed wrapping from bottom up and wear during the day  I do not think there is infx at this time but will watch for sighs and symptoms  ER precautions given      Relevant Orders   For home use only DME Other  see comment

## 2023-06-26 ENCOUNTER — Encounter (INDEPENDENT_AMBULATORY_CARE_PROVIDER_SITE_OTHER): Payer: Self-pay

## 2023-07-11 ENCOUNTER — Encounter (INDEPENDENT_AMBULATORY_CARE_PROVIDER_SITE_OTHER): Payer: Self-pay

## 2023-07-15 NOTE — Progress Notes (Addendum)
This encounter was created in error - please disregard. Per pt [employer] came out a couple of weeks ago and completed AWV. PT declined to schedule for next year stated she has already scheduled with [facility] and would like to continue with the home visit.

## 2023-08-06 ENCOUNTER — Ambulatory Visit (INDEPENDENT_AMBULATORY_CARE_PROVIDER_SITE_OTHER): Payer: Medicare Other | Admitting: Family Medicine

## 2023-08-06 ENCOUNTER — Encounter: Payer: Self-pay | Admitting: Family Medicine

## 2023-08-06 VITALS — BP 136/84 | HR 82 | Temp 97.8°F | Ht 65.0 in | Wt 157.0 lb

## 2023-08-06 DIAGNOSIS — E538 Deficiency of other specified B group vitamins: Secondary | ICD-10-CM | POA: Diagnosis not present

## 2023-08-06 DIAGNOSIS — E559 Vitamin D deficiency, unspecified: Secondary | ICD-10-CM

## 2023-08-06 DIAGNOSIS — E039 Hypothyroidism, unspecified: Secondary | ICD-10-CM | POA: Diagnosis not present

## 2023-08-06 DIAGNOSIS — M810 Age-related osteoporosis without current pathological fracture: Secondary | ICD-10-CM

## 2023-08-06 DIAGNOSIS — Z23 Encounter for immunization: Secondary | ICD-10-CM

## 2023-08-06 DIAGNOSIS — E7800 Pure hypercholesterolemia, unspecified: Secondary | ICD-10-CM

## 2023-08-06 DIAGNOSIS — I700 Atherosclerosis of aorta: Secondary | ICD-10-CM

## 2023-08-06 DIAGNOSIS — Z1231 Encounter for screening mammogram for malignant neoplasm of breast: Secondary | ICD-10-CM

## 2023-08-06 DIAGNOSIS — Z0000 Encounter for general adult medical examination without abnormal findings: Secondary | ICD-10-CM

## 2023-08-06 DIAGNOSIS — E2839 Other primary ovarian failure: Secondary | ICD-10-CM

## 2023-08-06 DIAGNOSIS — R600 Localized edema: Secondary | ICD-10-CM

## 2023-08-06 MED ORDER — MUPIROCIN 2 % EX OINT: 1.0000 | TOPICAL_OINTMENT | Freq: Every day | CUTANEOUS | 1 refills | Status: DC | PRN

## 2023-08-06 NOTE — Progress Notes (Signed)
Subjective:    Patient ID: Carla Kelly, female    DOB: Aug 03, 1933, 89 y.o.   MRN: [id number]  HPI  Here for health maintenance exam and to review chronic medical problems   Wt Readings from Last 3 Encounters:  08/06/23 157 lb (71.2 kg)  07/15/23 159 lb (72.1 kg)  04/11/23 163 lb 4 oz (74 kg)   26.13 kg/m  Vitals:   08/06/23 1506  BP: 136/84  Pulse: 82  Temp: 97.8 F (36.6 C)  SpO2: 95%    Immunization History  Administered Date(s) Administered   Fluad Quad(high Dose 65+) 10/03/2020, 12/26/2022   Fluad Trivalent(High Dose 65+) 08/06/2023   Influenza Split 08/30/2011, 08/19/2012   Influenza Whole 09/01/2004, 10/10/2007, 08/25/2008, 08/29/2009, 08/22/2010   Influenza, High Dose Seasonal PF 08/02/2017   Influenza,inj,Quad PF,6+ Mos 09/01/2013, 08/17/2015, 08/17/2016, 09/04/2018, 07/21/2019   Influenza-Unspecified 12/15/2021   Moderna SARS-COV2 Booster Vaccination 09/28/2022   Moderna Sars-Covid-2 Vaccination 12/02/2020, 09/01/2021   PFIZER(Purple Top)SARS-COV-2 Vaccination 12/10/2019, 12/31/2019   Pneumococcal Conjugate-13 02/15/2015   Pneumococcal Polysaccharide-23 08/25/2008   Td 03/03/2003, 12/12/2012    Health Maintenance Due  Topic Date Due   Zoster Vaccines- Shingrix (1 of 2) Never done   MAMMOGRAM  05/05/2022   DTaP/Tdap/Td (3 - Tdap) 12/12/2022   Medicare Annual Wellness (AWV)  07/13/2023  Feeling  good  Has her own apt in familie's house   Shingrix- considering   Flu shot -today   Tetanus shot -will inquire at pharmacy   Wearing her supp socks with open toe  Likes them a lot  Swelling is much better    Mammogram 04/2021  -wants to get in [place]  Self breast exam-no lumps  Breasts are dense   Gyn health   Colon cancer screening -out aged   Bone health  Dexa 04/2021   osteoporosis  Wants to get one in [place]  Falls-none / uses walker  Fractures-none  Past vert comp fracture  Supplements  Last vitamin D Lab Results  Component  Value Date   VD25OH 62.16 07/09/2022    Exercise : uses walker/ active when she can be    Mood    12/26/2022    2:09 PM 07/16/2022    2:36 PM 07/12/2022    1:48 PM 07/10/2021    2:06 PM 07/06/2020    3:25 PM  Depression screen PHQ 2/9  Decreased Interest 0 0 0 0 0  Down, Depressed, Hopeless 0 0 0 0 0  PHQ - 2 Score 0 0 0 0 0  Altered sleeping 1 0 0    Tired, decreased energy 1 1 0    Change in appetite 0 0 0    Feeling bad or failure about yourself  0 0 0    Trouble concentrating 0 0 0    Moving slowly or fidgety/restless 0 0 0    Suicidal thoughts 0 0 0    PHQ-9 Score 2 1 0    Difficult doing work/chores Not difficult at all  Not difficult at all     History of aortic atherosclerosis  No symptoms   Past history of a fib  No re occurrence  Takes diltiazem CD 120 mg daily   BP Readings from Last 3 Encounters:  08/06/23 136/84  04/11/23 130/70  03/01/23 134/76   Pulse Readings from Last 3 Encounters:  08/06/23 82  04/11/23 85  03/01/23 75   Takes asa 325 mg daily    Hypothyroidism  Pt has no clinical changes No  change in energy level/ hair or skin/ edema and no tremor Lab Results  Component Value Date   TSH 5.48 07/09/2022    Levothyroxine 88 mcg daily  Due for labs   Eating well  Good appetite     Uses bactroban on occational wound/leg  (cellulitis in past)  Needs refill   History of low B12 Lab Results  Component Value Date   VITAMINB12 360 07/09/2022   Oral supplementation   Hyperlipidemia Lab Results  Component Value Date   CHOL 215 (H) 07/09/2022   CHOL 225 (H) 07/10/2021   CHOL 219 (H) 06/29/2020   Lab Results  Component Value Date   HDL 72.40 07/09/2022   HDL 66.10 07/10/2021   HDL 67.50 06/29/2020   Lab Results  Component Value Date   LDLCALC 129 (H) 07/09/2022   LDLCALC 138 (H) 07/10/2021   LDLCALC 138 (H) 06/29/2020   Lab Results  Component Value Date   TRIG 67.0 07/09/2022   TRIG 104.0 07/10/2021   TRIG 64.0  06/29/2020   Lab Results  Component Value Date   CHOLHDL 3 07/09/2022   CHOLHDL 3 07/10/2021   CHOLHDL 3 06/29/2020   Lab Results  Component Value Date   LDLDIRECT 149.5 12/08/2013   LDLDIRECT 111.0 12/04/2012   LDLDIRECT 128.3 09/17/2011   Diet controlled      Patient Active Problem List   Diagnosis Date Noted   Aortic atherosclerosis (HCC) 04/20/2021   History of atrial fibrillation 03/22/2021   Constipation 10/03/2020   Closed compression fracture of L1 vertebra (HCC) 09/26/2020   Hearing loss 07/06/2020   Hammer toe of right foot 03/12/2018   Encounter for screening mammogram for breast cancer 03/04/2017   Routine general medical examination at a health care facility 02/12/2016   Estrogen deficiency 02/15/2015   Arthritis of right knee 08/13/2014   Status post total right knee replacement 08/13/2014   Transient global amnesia 02/20/2014   Encounter for Medicare annual wellness exam 12/07/2013   Leg edema 03/11/2012   B12 deficiency 09/05/2009   Vitamin D deficiency 11/08/2008   Hyperlipidemia 08/25/2008   Osteoporosis 08/25/2008   History of colonic polyps 08/25/2008   HERPES ZOSTER, HX OF 06/03/2008   Hypothyroidism 08/04/2007   IRRITABLE BOWEL SYNDROME 08/04/2007   VAGINITIS, ATROPHIC 08/04/2007   Osteoarthrosis, unspecified whether generalized or localized, unspecified site 08/04/2007   FIBROMYALGIA 08/04/2007   URINARY INCONTINENCE 08/04/2007   Asthma 08/04/2007   Past Medical History:  Diagnosis Date   Adenomatous colon polyp    Allergy years ago   alcohol by mouth   Anemia    Asthma    Cataract surgery   Both eyes   Depression    Disorder of bone and cartilage, unspecified    Embolism and thrombosis of unspecified site    Fibromyalgia    GERD (gastroesophageal reflux disease) 2020   small hiatal hernia   Heart murmur Mitral valve   History of herpes zoster    History of hiatal hernia    HLD (hyperlipidemia)    Hypoglycemia     Hypothyroidism    IBS (irritable bowel syndrome)    Mitral valve disorder    "leaking mitral valve-very mild"   Orbital floor fracture (HCC) 08/2015   with a fall    Osteoarthritis    Osteoarthrosis, unspecified whether generalized or localized, unspecified site    Other B-complex deficiencies    Psoriasis    Stroke (HCC)    Swelling of extremity, right    right  leg   TGA (transient global amnesia) 01/2014   Unspecified urinary incontinence    Unspecified vitamin D deficiency    Past Surgical History:  Procedure Laterality Date   ABDOMINAL HYSTERECTOMY  1970   fibroids; endometriosis   APPENDECTOMY     bladder tack  03/2004   BREAST BIOPSY Right 1995   BREAST CYST ASPIRATION Left 03/17/2015   CATARACT EXTRACTION Right 10/2012   Dr Druscilla Brownie   CATARACT EXTRACTION W/PHACO Left 05/10/2015   Procedure: CATARACT EXTRACTION PHACO AND INTRAOCULAR LENS PLACEMENT (IOC);  Surgeon: Galen Manila, MD;  Location: [facility];  Service: Ophthalmology;  Laterality: Left;  [place] 00:59 AP% 26.7 CDE 15.85 fluid pack lot#[id number]   CERVICAL DISC SURGERY     x 2   CHOLECYSTECTOMY  2004   COLONOSCOPY  12/2000   polyps, BE-polyps   EYE SURGERY     right eye   JOINT REPLACEMENT     KYPHOPLASTY N/A 11/08/2020   Procedure: L1 Kyphoplasty;  Surgeon: Kennedy Bucker, MD;  Location: [facility];  Service: Orthopedics;  Laterality: N/A;   MOUTH SURGERY     lower teeth have 4 studs to keep in place   SPINE SURGERY  fused C5/6, C6/7, L1 surgery   I was 89 years old, 52   TONSILLECTOMY     TOTAL KNEE ARTHROPLASTY Right 08/13/2014   Procedure: RIGHT TOTAL KNEE ARTHROPLASTY;  Surgeon: Kathryne Hitch, MD;  Location: [facility];  Service: Orthopedics;  Laterality: Right;   TYMPANOPLASTY     US TRANSVAGINAL PELVIC MODIFIED  08/2001   small left ovarian cyst   Social History   Tobacco Use   Smoking status: Never   Smokeless tobacco: Never   Tobacco comments:    2nd hand smoke from husbands-2 packs a  day!  Vaping Use   Vaping status: Never Used  Substance Use Topics   Alcohol use: Never    Comment: "is allergic"   Drug use: Never   Family History  Problem Relation Age of Onset   Heart attack Mother    Stroke Mother        several   Alcohol abuse Mother    Arthritis Mother    Heart disease Mother    Lupus Brother    Heart disease Brother    Other Sister        benign brain tumor   Varicose Veins Sister    Asthma Daughter    Breast cancer Daughter    Cancer Daughter    Other Brother        heart problems   Colon cancer Other        1st cousin   Allergies  Allergen Reactions   Alcohol-Sulfur [Elemental Sulfur] Shortness Of Breath and Swelling    Alcohol po swells throat   Ergocalciferol Other (See Comments)    REACTION: GI side effects   Morphine Nausea And Vomiting   Tolterodine Tartrate Other (See Comments)    REACTION: ? reaction   Alcohol Other (See Comments) and Swelling   Myrbetriq [Mirabegron] Other (See Comments)    GI Upset   Nortriptyline Hcl Other (See Comments)    unknown   Current Outpatient Medications on File Prior to Visit  Medication Sig Dispense Refill   albuterol (PROVENTIL) 4 MG tablet Take 1 tablet (4 mg total) by mouth every 4 (four) hours as needed for wheezing or shortness of breath. 30 tablet 5   Apoaequorin (PREVAGEN) 10 MG CAPS      aspirin 325 MG  tablet Take 325 mg by mouth daily.     Calcium-Magnesium-Zinc (CAL-MAG-ZINC PO) Take 1 tablet by mouth in the morning, at noon, and at bedtime.     Cholecalciferol (VITAMIN D3 GUMMIES PO) Take 2 tablets by mouth daily.     diltiazem (CARDIZEM CD) 120 MG 24 hr capsule TAKE 1 CAPSULE(120 MG) BY MOUTH DAILY 90 capsule 3   furosemide (LASIX) 40 MG tablet Take 0.5-1 tablets (20-40 mg total) by mouth daily. 90 tablet 3   Probiotic Product (PROBIOTIC PO) Take 1 capsule by mouth daily.     SYNTHROID 88 MCG tablet TAKE 1 TABLET(88 MCG) BY MOUTH DAILY BEFORE BREAKFAST 90 tablet 3   No current  facility-administered medications on file prior to visit.    Review of Systems  Constitutional:  Negative for activity change, appetite change, fatigue, fever and unexpected weight change.  HENT:  Negative for congestion, ear pain, rhinorrhea, sinus pressure and sore throat.   Eyes:  Negative for pain, redness and visual disturbance.  Respiratory:  Negative for cough, shortness of breath and wheezing.   Cardiovascular:  Negative for chest pain and palpitations.  Gastrointestinal:  Negative for abdominal pain, blood in stool, constipation and diarrhea.  Endocrine: Negative for polydipsia and polyuria.  Genitourinary:  Negative for dysuria, frequency and urgency.  Musculoskeletal:  Positive for arthralgias and back pain. Negative for myalgias.  Skin:  Negative for pallor and rash.  Allergic/Immunologic: Negative for environmental allergies.  Neurological:  Negative for dizziness, syncope and headaches.  Hematological:  Negative for adenopathy. Does not bruise/bleed easily.  Psychiatric/Behavioral:  Negative for decreased concentration and dysphoric mood. The patient is not nervous/anxious.        Objective:   Physical Exam Constitutional:      General: She is not in acute distress.    Appearance: Normal appearance. She is well-developed and normal weight. She is not ill-appearing or diaphoretic.  HENT:     Head: Normocephalic and atraumatic.     Right Ear: Tympanic membrane, ear canal and external ear normal.     Left Ear: Tympanic membrane, ear canal and external ear normal.     Nose: Nose normal. No congestion.     Mouth/Throat:     Mouth: Mucous membranes are moist.     Pharynx: Oropharynx is clear. No posterior oropharyngeal erythema.  Eyes:     General: No scleral icterus.    Extraocular Movements: Extraocular movements intact.     Conjunctiva/sclera: Conjunctivae normal.     Pupils: Pupils are equal, round, and reactive to light.  Neck:     Thyroid: No thyromegaly.      Vascular: No carotid bruit or JVD.  Cardiovascular:     Rate and Rhythm: Normal rate and regular rhythm.     Pulses: Normal pulses.     Heart sounds: Normal heart sounds.     No gallop.  Pulmonary:     Effort: Pulmonary effort is normal. No respiratory distress.     Breath sounds: Normal breath sounds. No wheezing.     Comments: Good air exch Chest:     Chest wall: No tenderness.  Abdominal:     General: Bowel sounds are normal. There is no distension or abdominal bruit.     Palpations: Abdomen is soft. There is no mass.     Tenderness: There is no abdominal tenderness.     Hernia: No hernia is present.  Genitourinary:    Comments: Breast exam: No mass, nodules, thickening, tenderness, bulging, retraction, inflamation,  nipple discharge or skin changes noted.  No axillary or clavicular LA.     Musculoskeletal:        General: No tenderness. Normal range of motion.     Cervical back: Normal range of motion and neck supple. No rigidity. No muscular tenderness.     Right lower leg: No edema.     Left lower leg: No edema.     Comments: No kyphosis   Lymphadenopathy:     Cervical: No cervical adenopathy.  Skin:    General: Skin is warm and dry.     Coloration: Skin is not pale.     Findings: No erythema or rash.     Comments: Solar lentigines diffusely Some skin tags on trunk Scattered sks   Neurological:     Mental Status: She is alert. Mental status is at baseline.     Cranial Nerves: No cranial nerve deficit.     Motor: No abnormal muscle tone.     Coordination: Coordination normal.     Gait: Gait normal.     Deep Tendon Reflexes: Reflexes are normal and symmetric. Reflexes normal.  Psychiatric:        Mood and Affect: Mood normal.        Cognition and Memory: Cognition and memory normal.     Comments: Mentally sharp            Assessment & Plan:   Problem List Items Addressed This Visit       Cardiovascular and Mediastinum   Aortic atherosclerosis (HCC)    No  symptoms  Continue to monitor blood pressure and cholesterol        Endocrine   Hypothyroidism    TSH ordered  No clinical changes  Taking levothyroxine 88 mcg daily       Relevant Orders   TSH   Lipid panel     Musculoskeletal and Integument   Osteoporosis    Dexa ordered No falls or fracture (remote history of comp fracture in spine) Has declined treatment so far  D level ordered Discussed fall prevention, supplements and exercise for bone density          Other   B12 deficiency    B12 level today  Oral supplementation       Relevant Orders   CBC with Differential/Platelet   Vitamin B12   Encounter for screening mammogram for breast cancer    Mammogram ordered Pt will call to schedule       Relevant Orders   MM 3D SCREENING MAMMOGRAM BILATERAL BREAST   Estrogen deficiency    Dexa ordered      Relevant Orders   DG Bone Density   Hyperlipidemia    Disc goals for lipids and reasons to control them Rev last labs with pt Rev low sat fat diet in detail  Diet controlled  Labs today         Relevant Orders   Lipid panel   Comprehensive metabolic panel   Leg edema    Much improved wearing supp hose (with open toes) Can get them on herself       Routine general medical examination at a health care facility - Primary    Reviewed health habits including diet and exercise and skin cancer prevention Reviewed appropriate screening tests for age  Also reviewed health mt list, fam hx and immunization status , as well as social and family history   See HPI Labs reviewed and ordered  Flu shot given  Considering shingrix later  Will inq about Td at pharmacy  Mammogram ordered Dexa ordered  Discussed fall prevention, supplements and exercise for bone density  PHQ 2 -pt thinks she is doing well / some fatigue with age       Vitamin D deficiency    Last vitamin D Lab Results  Component Value Date   VD25OH 62.16 07/09/2022   Vitamin D level is  therapeutic with current supplementation Disc importance of this to bone and overall health       Relevant Orders   VITAMIN D 25 Hydroxy (Vit-D Deficiency, Fractures)   Other Visit Diagnoses     Need for influenza vaccination       Relevant Orders   Flu Vaccine Trivalent High Dose (Fluad) (Completed)

## 2023-08-06 NOTE — Assessment & Plan Note (Signed)
Much improved wearing supp hose (with open toes) Can get them on herself

## 2023-08-06 NOTE — Assessment & Plan Note (Signed)
TSH ordered  No clinical changes  Taking levothyroxine 88 mcg daily

## 2023-08-06 NOTE — Assessment & Plan Note (Signed)
Dexa ordered No falls or fracture (remote history of comp fracture in spine) Has declined treatment so far  D level ordered Discussed fall prevention, supplements and exercise for bone density

## 2023-08-06 NOTE — Assessment & Plan Note (Signed)
Mammogram ordered °Pt will call to schedule  °

## 2023-08-06 NOTE — Assessment & Plan Note (Signed)
No symptoms  Continue to monitor blood pressure and cholesterol

## 2023-08-06 NOTE — Assessment & Plan Note (Signed)
Reviewed health habits including diet and exercise and skin cancer prevention Reviewed appropriate screening tests for age  Also reviewed health mt list, fam hx and immunization status , as well as social and family history   See HPI Labs reviewed and ordered  Flu shot given  Considering shingrix later  Will inq about Td at pharmacy  Mammogram ordered Dexa ordered  Discussed fall prevention, supplements and exercise for bone density  PHQ 2 -pt thinks she is doing well / some fatigue with age

## 2023-08-06 NOTE — Patient Instructions (Addendum)
If you are interested in the new shingles vaccine (Shingrix) - call your local pharmacy to check on coverage and availability  If affordable, get on a wait list at your pharmacy to get the vaccine.  You are are also due for a tetanus shot also - check with the pharmacy   Flu shot today   A covid shot this fall is a good idea   Labs today    Stay active Keep using your exercise bike     Call [place] to schedule mammogram and bone density test    You have an order for:  []   2D Mammogram  [x]   3D Mammogram  [x]   Bone Density     Please call for appointment:   [x]   [facility]  [address] [place] [address]  [phone]  []   [facility])   [address]  [place], [place] [address]  [phone]  []   [facility]      [address] [place], [place]        [phone]         []   [facility]  [address] [place], [place]  [phone]  []   [facility]   [address]   [place], [place] [address]  [phone]  []  [facility]  [address] [place], [place] [address] [phone]    Make sure to wear two piece clothing  No lotions powders or deodorants the day of the appointment Make sure to bring picture ID and insurance card.  Bring list of medications you are currently taking including any supplements.   Schedule your screening mammogram through MyChart!   Select [place] imaging sites can now be scheduled through MyChart.  Log into your MyChart account.  Go to 'Visit' (or 'Appointments' if  on mobile App) --> Schedule an  Appointment  Under 'Select a Reason for Visit' choose the Mammogram  Screening option.  Complete the pre-visit questions  and select the time and place that  best  fits your schedule

## 2023-08-06 NOTE — Assessment & Plan Note (Signed)
Dexa ordered

## 2023-08-06 NOTE — Assessment & Plan Note (Signed)
B12 level today  Oral supplementation

## 2023-08-06 NOTE — Assessment & Plan Note (Signed)
Last vitamin D Lab Results  Component Value Date   VD25OH 62.16 07/09/2022   Vitamin D level is therapeutic with current supplementation Disc importance of this to bone and overall health

## 2023-08-06 NOTE — Assessment & Plan Note (Signed)
Disc goals for lipids and reasons to control them Rev last labs with pt Rev low sat fat diet in detail  Diet controlled  Labs today

## 2023-08-07 LAB — LIPID PANEL
Cholesterol: 199 mg/dL (ref 0–200)
HDL: 65.4 mg/dL (ref >39.00)
LDL Cholesterol: 107 mg/dL — ABNORMAL HIGH (ref 0–99)
NonHDL: 133.37
Total CHOL/HDL Ratio: 3
Triglycerides: 132 mg/dL (ref 0.0–149.0)
VLDL: 26.4 mg/dL (ref 0.0–40.0)

## 2023-08-07 LAB — CBC WITH DIFFERENTIAL/PLATELET
Basophils Absolute: 0 10*3/uL (ref 0.0–0.1)
Basophils Relative: 0.9 % (ref 0.0–3.0)
Eosinophils Absolute: 0.2 10*3/uL (ref 0.0–0.7)
Eosinophils Relative: 3.4 % (ref 0.0–5.0)
HCT: 39.6 % (ref 36.0–46.0)
Hemoglobin: 13 g/dL (ref 12.0–15.0)
Lymphocytes Relative: 49.1 % — ABNORMAL HIGH (ref 12.0–46.0)
Lymphs Abs: 2.5 10*3/uL (ref 0.7–4.0)
MCHC: 32.7 g/dL (ref 30.0–36.0)
MCV: 95.5 fl (ref 78.0–100.0)
Monocytes Absolute: 0.4 10*3/uL (ref 0.1–1.0)
Monocytes Relative: 6.8 % (ref 3.0–12.0)
Neutro Abs: 2 10*3/uL (ref 1.4–7.7)
Neutrophils Relative %: 39.8 % — ABNORMAL LOW (ref 43.0–77.0)
Platelets: 183 10*3/uL (ref 150.0–400.0)
RBC: 4.15 Mil/uL (ref 3.87–5.11)
RDW: 13 % (ref 11.5–15.5)
WBC: 5.2 10*3/uL (ref 4.0–10.5)

## 2023-08-07 LAB — COMPREHENSIVE METABOLIC PANEL
ALT: 13 U/L (ref 0–35)
AST: 18 U/L (ref 0–37)
Albumin: 4 g/dL (ref 3.5–5.2)
Alkaline Phosphatase: 65 U/L (ref 39–117)
BUN: 20 mg/dL (ref 6–23)
CO2: 26 meq/L (ref 19–32)
Calcium: 9.3 mg/dL (ref 8.4–10.5)
Chloride: 106 meq/L (ref 96–112)
Creatinine, Ser: 0.82 mg/dL (ref 0.40–1.20)
GFR: 63.8 mL/min (ref >60.00)
Glucose, Bld: 80 mg/dL (ref 70–99)
Potassium: 4.3 meq/L (ref 3.5–5.1)
Sodium: 140 meq/L (ref 135–145)
Total Bilirubin: 0.3 mg/dL (ref 0.2–1.2)
Total Protein: 6.4 g/dL (ref 6.0–8.3)

## 2023-08-07 LAB — VITAMIN D 25 HYDROXY (VIT D DEFICIENCY, FRACTURES): VITD: 82.93 ng/mL (ref 30.00–100.00)

## 2023-08-07 LAB — TSH: TSH: 1.28 u[IU]/mL (ref 0.35–5.50)

## 2023-08-07 LAB — VITAMIN B12: Vitamin B-12: 490 pg/mL (ref 211–911)

## 2023-08-28 ENCOUNTER — Other Ambulatory Visit: Payer: Self-pay | Admitting: Family Medicine

## 2023-10-08 ENCOUNTER — Telehealth: Payer: Self-pay | Admitting: Family Medicine

## 2023-10-08 MED ORDER — DILTIAZEM HCL ER COATED BEADS 120 MG PO CP24: ORAL_CAPSULE | ORAL | 2 refills | Status: DC

## 2023-10-08 NOTE — Telephone Encounter (Signed)
Prescription Request  10/08/2023  LOV: 08/06/2023  What is the name of the medication or equipment? diltiazem (CARDIZEM CD) 120 MG 24 hr capsule   Have you contacted your pharmacy to request a refill? Yes   Which pharmacy would you like this sent to?  [id number] [address] [place] [address] Phone: [phone] Fax: [phone]    Patient notified that their request is being sent to the clinical staff for review and that they should receive a response within 2 business days.   Please advise at [facility] [phone]

## 2023-10-10 ENCOUNTER — Other Ambulatory Visit: Payer: Self-pay | Admitting: Family Medicine

## 2024-01-10 ENCOUNTER — Telehealth: Payer: Self-pay | Admitting: *Deleted

## 2024-01-10 NOTE — Telephone Encounter (Signed)
Yes, please schedule  Also triage  Her a fib I think was a while ago and I don't think she has a current cardiologist

## 2024-01-10 NOTE — Telephone Encounter (Signed)
Copied from CRM [id number]. Topic: Clinical - Medical Advice >> Jan 10, 2024  2:34 PM Chantha C wrote: Reason for CRM: Patient has a question for Dr. Milinda Antis, has A-fib and recently patient feels like a vibrations and electric charge in chest. Patient denies no weakness, dizziness, lightheadness nor shortness of breath; no symptoms at this moment. Patient asked if she needs to be seen in the office, please advise [phone].

## 2024-01-10 NOTE — Telephone Encounter (Signed)
Called patient states that in the past you had asked if she had any symptoms of a fib. At that time she had not. About 3 weeks ago she started having "a vibration" feeling under her left breast. She states she has off and on through the day. Each episode last 8-10 seconds. Patient denies any symptoms at all. Still doing all her daily activities. If any changes in symptoms over the weekend she will be seen at ED or urgent care.

## 2024-01-11 NOTE — Telephone Encounter (Signed)
I will see her then Agree with ER precautions

## 2024-01-14 ENCOUNTER — Encounter: Payer: Self-pay | Admitting: Family Medicine

## 2024-01-14 ENCOUNTER — Ambulatory Visit: Payer: Medicare Other | Admitting: Family Medicine

## 2024-01-14 VITALS — BP 110/66 | HR 84 | Temp 97.9°F | Ht 65.0 in | Wt 159.5 lb

## 2024-01-14 DIAGNOSIS — R002 Palpitations: Secondary | ICD-10-CM

## 2024-01-14 DIAGNOSIS — R0789 Other chest pain: Secondary | ICD-10-CM | POA: Insufficient documentation

## 2024-01-14 DIAGNOSIS — Z8679 Personal history of other diseases of the circulatory system: Secondary | ICD-10-CM

## 2024-01-14 NOTE — Patient Instructions (Signed)
I put the referral in for cardiology  Please let us know if you don't hear in 1-2 weeks   Lay off caffeine  Make sure bra is not too tight  Watch for a rash  Continue current medicines   If symptoms worsen in the meantime let us know    If you develop chest pain /pressure or shortness of breath -please go to the ER

## 2024-01-14 NOTE — Assessment & Plan Note (Addendum)
Pt feels sense of vibration under left breast  (atypical)  Atypical/not exertional (in fact exercise may have helped)  Remote history of brief a fib in past Takes cardizem CD 120 mg daily  Takes asa  Takes lasix for chronic pedal edema  Normal EKG today   Encouraged to avoid caffeine  Avoid tight clothing  Watch for rash   Referral made to cardiology for further eval/treatment   Call back and Er precautions noted in detail today
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
## 2024-01-14 NOTE — Assessment & Plan Note (Signed)
Once in hospital in 2022 with pna  Never re occurred   Doubt her recent feeling of vibration in chest is related but will refer to cardiology to be sure  EKG normal  today  Call back and Er precautions noted in detail today

## 2024-04-06 ENCOUNTER — Ambulatory Visit: Payer: Medicare Other | Admitting: Cardiovascular Disease

## 2024-05-20 ENCOUNTER — Ambulatory Visit: Admitting: Internal Medicine

## 2024-06-29 ENCOUNTER — Other Ambulatory Visit: Payer: Self-pay | Admitting: Family Medicine

## 2024-08-11 ENCOUNTER — Ambulatory Visit (INDEPENDENT_AMBULATORY_CARE_PROVIDER_SITE_OTHER): Admitting: Family Medicine

## 2024-08-11 ENCOUNTER — Encounter: Payer: Self-pay | Admitting: Family Medicine

## 2024-08-11 VITALS — BP 128/64 | HR 75 | Temp 98.3°F | Ht 65.0 in | Wt 158.4 lb

## 2024-08-11 DIAGNOSIS — E559 Vitamin D deficiency, unspecified: Secondary | ICD-10-CM | POA: Diagnosis not present

## 2024-08-11 DIAGNOSIS — E7800 Pure hypercholesterolemia, unspecified: Secondary | ICD-10-CM | POA: Diagnosis not present

## 2024-08-11 DIAGNOSIS — M810 Age-related osteoporosis without current pathological fracture: Secondary | ICD-10-CM | POA: Diagnosis not present

## 2024-08-11 DIAGNOSIS — Z1231 Encounter for screening mammogram for malignant neoplasm of breast: Secondary | ICD-10-CM

## 2024-08-11 DIAGNOSIS — Z0000 Encounter for general adult medical examination without abnormal findings: Secondary | ICD-10-CM | POA: Diagnosis not present

## 2024-08-11 DIAGNOSIS — E039 Hypothyroidism, unspecified: Secondary | ICD-10-CM

## 2024-08-11 DIAGNOSIS — E538 Deficiency of other specified B group vitamins: Secondary | ICD-10-CM

## 2024-08-11 DIAGNOSIS — Z8679 Personal history of other diseases of the circulatory system: Secondary | ICD-10-CM

## 2024-08-11 DIAGNOSIS — Z23 Encounter for immunization: Secondary | ICD-10-CM

## 2024-08-11 NOTE — Assessment & Plan Note (Signed)
Disc goals for lipids and reasons to control them Rev last labs with pt Rev low sat fat diet in detail  Diet controlled  Labs today

## 2024-08-11 NOTE — Assessment & Plan Note (Signed)
 Once in hospital in 2022 with pna  Never re occurred   Continues Cardizem  CD 120 mg daily

## 2024-08-11 NOTE — Progress Notes (Signed)
 Subjective:    Patient ID: Carla Kelly, female    DOB: [birth year], 89 y.o.   MRN: [id number]  HPI  Here for health maintenance exam and to review chronic medical problems   Wt Readings from Last 3 Encounters:  08/11/24 158 lb 6 oz (71.8 kg)  01/14/24 159 lb 8 oz (72.3 kg)  08/06/23 157 lb (71.2 kg)   26.35 kg/m  Vitals:   08/11/24 1533  BP: 128/64  Pulse: 75  Temp: 98.3 F (36.8 C)  SpO2: 97%    Immunization History  Administered Date(s) Administered   Fluad Quad(high Dose 65+) 10/03/2020, 12/26/2022   Fluad Trivalent(High Dose 65+) 08/06/2023   INFLUENZA, HIGH DOSE SEASONAL PF 08/02/2017, 08/11/2024   Influenza Split 08/30/2011, 08/19/2012   Influenza Whole 09/01/2004, 10/10/2007, 08/25/2008, 08/29/2009, 08/22/2010   Influenza,inj,Quad PF,6+ Mos 09/01/2013, 08/17/2015, 08/17/2016, 09/04/2018, 07/21/2019   Influenza-Unspecified 12/15/2021   Moderna SARS-COV2 Booster Vaccination 09/28/2022   Moderna Sars-Covid-2 Vaccination 12/02/2020, 09/01/2021   PFIZER(Purple Top)SARS-COV-2 Vaccination 12/10/2019, 12/31/2019   Pneumococcal Conjugate-13 02/15/2015   Pneumococcal Polysaccharide-23 08/25/2008   Td 03/03/2003, 12/12/2012    Health Maintenance Due  Topic Date Due   Mammogram  05/05/2022   Medicare Annual Wellness (AWV)  07/13/2023   Flu shot -today  Tetanus shot 2014   Shingrix -declines / had shingles in the past   Mammogram 04/2021 -wants to get that also  Self breast exam  Gyn health-no complaints     Colon cancer screening -out aged   Bone health  Dexa 04/2021 - wants to get one  Declines treatment in past/had concern about side effect of alendronate  Osteoporosis  Falls-none  Fractures-none new  Past compression fracture  Supplements  Last vitamin D  Lab Results  Component Value Date   VD25OH 82.93 08/06/2023    Exercise  Rides an exercise bike Some shoulder and external hip pain      Mood    08/11/2024    3:39 PM 12/26/2022     2:09 PM 07/16/2022    2:36 PM 07/12/2022    1:48 PM 07/10/2021    2:06 PM  Depression screen PHQ 2/9  Decreased Interest 0 0 0 0 0  Down, Depressed, Hopeless 0 0 0 0 0  PHQ - 2 Score 0 0 0 0 0  Altered sleeping 0 1 0 0   Tired, decreased energy 0 1 1 0   Change in appetite 0 0 0 0   Feeling bad or failure about yourself  0 0 0 0   Trouble concentrating 0 0 0 0   Moving slowly or fidgety/restless 0 0 0 0   Suicidal thoughts 0 0 0 0   PHQ-9 Score 0 2 1 0   Difficult doing work/chores Not difficult at all Not difficult at all  Not difficult at all     Blood pressure BP Readings from Last 3 Encounters:  08/11/24 128/64  01/14/24 110/66  08/06/23 136/84   Pulse Readings from Last 3 Encounters:  08/11/24 75  01/14/24 84  08/06/23 82   Cardizem  CD 120 mg  Past history of a fib brief  Lasix  for pedal edema   B12 def Lab Results  Component Value Date   VITAMINB12 490 08/06/2023    Hyperlipidemia Lab Results  Component Value Date   CHOL 199 08/06/2023   HDL 65.40 08/06/2023   LDLCALC 107 (H) 08/06/2023   LDLDIRECT 149.5 12/08/2013   TRIG 132.0 08/06/2023   CHOLHDL 3 08/06/2023   Due  for labs  Diet controlled   Hypothyroidism  Pt has no clinical changes No change in energy level/ hair or skin/ edema and no tremor Lab Results  Component Value Date   TSH 1.28 08/06/2023    Due for lab   Levothyroxine  88 mcg daily     Patient Active Problem List   Diagnosis Date Noted   Chest discomfort 01/14/2024   Aortic atherosclerosis (HCC) 04/20/2021   History of atrial fibrillation 03/22/2021   Constipation 10/03/2020   Closed compression fracture of L1 vertebra (HCC) 09/26/2020   Hearing loss 07/06/2020   Hammer toe of right foot 03/12/2018   Encounter for screening mammogram for breast cancer 03/04/2017   Routine general medical examination at a health care facility 02/12/2016   Estrogen deficiency 02/15/2015   Arthritis of right knee 08/13/2014   Status post total  right knee replacement 08/13/2014   Transient global amnesia 02/20/2014   Encounter for Medicare annual wellness exam 12/07/2013   Leg edema 03/11/2012   B12 deficiency 09/05/2009   Vitamin D  deficiency 11/08/2008   Hyperlipidemia 08/25/2008   Osteoporosis 08/25/2008   History of colonic polyps 08/25/2008   HERPES ZOSTER, HX OF 06/03/2008   Hypothyroidism 08/04/2007   IRRITABLE BOWEL SYNDROME 08/04/2007   VAGINITIS, ATROPHIC 08/04/2007   Osteoarthrosis, unspecified whether generalized or localized, unspecified site 08/04/2007   FIBROMYALGIA 08/04/2007   URINARY INCONTINENCE 08/04/2007   Asthma 08/04/2007   Past Medical History:  Diagnosis Date   Adenomatous colon polyp    Allergy years ago   alcohol by mouth   Anemia    What is iron level???   Asthma    Cataract surgery   Both eyes   Depression    Disorder of bone and cartilage, unspecified    Embolism and thrombosis of unspecified site    Fibromyalgia    GERD (gastroesophageal reflux disease) 2020   small hiatal hernia   Heart murmur Mitral valve   History of herpes zoster    History of hiatal hernia    HLD (hyperlipidemia)    Hypoglycemia    Hypothyroidism    IBS (irritable bowel syndrome)    Mitral valve disorder    leaking mitral valve-very mild   Orbital floor fracture (HCC) 08/2015   with a fall    Osteoarthritis    Osteoarthrosis, unspecified whether generalized or localized, unspecified site    Other B-complex deficiencies    Psoriasis    Stroke (HCC)    Swelling of extremity, right    right leg   TGA (transient global amnesia) 01/2014   Unspecified urinary incontinence    Unspecified vitamin D  deficiency    Past Surgical History:  Procedure Laterality Date   ABDOMINAL HYSTERECTOMY  1970   fibroids; endometriosis   APPENDECTOMY     bladder tack  03/2004   BREAST BIOPSY Right 1995   BREAST CYST ASPIRATION Left 03/17/2015   CATARACT EXTRACTION Right 10/2012   Dr Jaye   CATARACT EXTRACTION  W/PHACO Left 05/10/2015   Procedure: CATARACT EXTRACTION PHACO AND INTRAOCULAR LENS PLACEMENT (IOC);  Surgeon: Elsie Jaye, MD;  Location: [facility];  Service: Ophthalmology;  Laterality: Left;  US  00:59 AP% 26.7 CDE 15.85 fluid pack lot#[id number]   CERVICAL DISC SURGERY     x 2   CHOLECYSTECTOMY  2004   COLONOSCOPY  12/2000   polyps, BE-polyps   EYE SURGERY     right eye   JOINT REPLACEMENT     STILL HAVE PROBLEMS   KYPHOPLASTY N/A  11/08/2020   Procedure: L1 Kyphoplasty;  Surgeon: Kathlynn Sharper, MD;  Location: [facility];  Service: Orthopedics;  Laterality: N/A;   MOUTH SURGERY     lower teeth have 4 studs to keep in place   SPINE SURGERY  fused C5/6, C6/7, L1 surgery   I was 89 years old, 67   TONSILLECTOMY     TOTAL KNEE ARTHROPLASTY Right 08/13/2014   Procedure: RIGHT TOTAL KNEE ARTHROPLASTY;  Surgeon: Lonni CINDERELLA Poli, MD;  Location: [facility];  Service: Orthopedics;  Laterality: Right;   TYMPANOPLASTY     US  TRANSVAGINAL PELVIC MODIFIED  08/2001   small left ovarian cyst   Social History   Tobacco Use   Smoking status: Never   Smokeless tobacco: Never   Tobacco comments:    2nd hand smoke from husbands-2 packs a day!  Vaping Use   Vaping status: Never Used  Substance Use Topics   Alcohol use: Never    Comment: is allergic   Drug use: Never   Family History  Problem Relation Age of Onset   Heart attack Mother    Stroke Mother        several   Alcohol abuse Mother    Arthritis Mother    Heart disease Mother    Lupus Brother    Heart disease Brother    Other Sister        benign brain tumor   Varicose Veins Sister    Asthma Daughter    Breast cancer Daughter    Cancer Daughter    Other Brother        heart problems   Colon cancer Other        1st cousin   Allergies  Allergen Reactions   Alcohol-Sulfur [Elemental Sulfur] Shortness Of Breath and Swelling    Alcohol po swells throat   Ergocalciferol  Other (See Comments)    REACTION: GI side  effects   Morphine  Nausea And Vomiting   Tolterodine Tartrate Other (See Comments)    REACTION: ? reaction   Alcohol Other (See Comments) and Swelling   Myrbetriq [Mirabegron] Other (See Comments)    GI Upset   Nortriptyline Hcl Other (See Comments)    unknown   Current Outpatient Medications on File Prior to Visit  Medication Sig Dispense Refill   diclofenac Sodium (VOLTAREN) 1 % GEL Apply topically 4 (four) times daily.     Melatonin 10 MG TABS Take 1 tablet by mouth at bedtime.     albuterol  (PROVENTIL ) 4 MG tablet Take 1 tablet (4 mg total) by mouth every 4 (four) hours as needed for wheezing or shortness of breath. 30 tablet 5   Apoaequorin (PREVAGEN) 10 MG CAPS      aspirin  325 MG tablet Take 325 mg by mouth daily.     Calcium -Magnesium -Zinc (CAL-MAG-ZINC PO) Take 1 tablet by mouth in the morning, at noon, and at bedtime.     Cholecalciferol  (VITAMIN D3 GUMMIES PO) Take 2 tablets by mouth daily.     diltiazem  (CARDIZEM  CD) 120 MG 24 hr capsule TAKE 1 CAPSULE(120 MG) BY MOUTH DAILY 90 capsule 0   furosemide  (LASIX ) 40 MG tablet TAKE 1/2-1 TABLET BY MOUTH EVERY DAY 90 tablet 3   Probiotic Product (PROBIOTIC PO) Take 1 capsule by mouth daily.     SYNTHROID  88 MCG tablet TAKE 1 TABLET BY MOUTH EVERY DAY IN THE MORNING BEFORE BREAKFAST 90 tablet 3   No current facility-administered medications on file prior to visit.    Review  of Systems  Constitutional:  Negative for activity change, appetite change, fatigue, fever and unexpected weight change.  HENT:  Negative for congestion, ear pain, rhinorrhea, sinus pressure and sore throat.   Eyes:  Negative for pain, redness and visual disturbance.  Respiratory:  Negative for cough, shortness of breath and wheezing.   Cardiovascular:  Negative for chest pain and palpitations.  Gastrointestinal:  Negative for abdominal pain, blood in stool, constipation and diarrhea.  Endocrine: Negative for polydipsia and polyuria.  Genitourinary:   Negative for dysuria, frequency and urgency.  Musculoskeletal:  Positive for arthralgias and back pain. Negative for myalgias.  Skin:  Negative for pallor and rash.  Allergic/Immunologic: Negative for environmental allergies.  Neurological:  Negative for dizziness, syncope and headaches.  Hematological:  Negative for adenopathy. Does not bruise/bleed easily.  Psychiatric/Behavioral:  Negative for decreased concentration and dysphoric mood. The patient is not nervous/anxious.        Objective:   Physical Exam Constitutional:      General: She is not in acute distress.    Appearance: Normal appearance. She is well-developed and normal weight. She is not ill-appearing or diaphoretic.  HENT:     Head: Normocephalic and atraumatic.     Right Ear: Tympanic membrane, ear canal and external ear normal.     Left Ear: Tympanic membrane, ear canal and external ear normal.     Nose: Nose normal. No congestion.     Mouth/Throat:     Mouth: Mucous membranes are moist.     Pharynx: Oropharynx is clear. No posterior oropharyngeal erythema.  Eyes:     General: No scleral icterus.    Extraocular Movements: Extraocular movements intact.     Conjunctiva/sclera: Conjunctivae normal.     Pupils: Pupils are equal, round, and reactive to light.  Neck:     Thyroid : No thyromegaly.     Vascular: No carotid bruit or JVD.  Cardiovascular:     Rate and Rhythm: Normal rate and regular rhythm.     Pulses: Normal pulses.     Heart sounds: Normal heart sounds.     No gallop.  Pulmonary:     Effort: Pulmonary effort is normal. No respiratory distress.     Breath sounds: Normal breath sounds. No wheezing.     Comments: Good air exch Chest:     Chest wall: No tenderness.  Abdominal:     General: Bowel sounds are normal. There is no distension or abdominal bruit.     Palpations: Abdomen is soft. There is no mass.     Tenderness: There is no abdominal tenderness.     Hernia: No hernia is present.   Genitourinary:    Comments: Breast exam: No mass, nodules, thickening, tenderness, bulging, retraction, inflamation, nipple discharge or skin changes noted.  No axillary or clavicular LA.     Musculoskeletal:        General: No tenderness. Normal range of motion.     Cervical back: Normal range of motion and neck supple. No rigidity. No muscular tenderness.     Right lower leg: No edema.     Left lower leg: No edema.     Comments: No kyphosis   Lymphadenopathy:     Cervical: No cervical adenopathy.  Skin:    General: Skin is warm and dry.     Coloration: Skin is not pale.     Findings: No erythema or rash.     Comments: Solar lentigines diffusely Scattered sks   Neurological:  Mental Status: She is alert. Mental status is at baseline.     Cranial Nerves: No cranial nerve deficit.     Motor: No abnormal muscle tone.     Coordination: Coordination normal.     Gait: Gait normal.     Deep Tendon Reflexes: Reflexes are normal and symmetric. Reflexes normal.  Psychiatric:        Mood and Affect: Mood normal.        Cognition and Memory: Cognition and memory normal.           Assessment & Plan:   Problem List Items Addressed This Visit       Endocrine   Hypothyroidism   TSH ordered  No clinical changes  Taking levothyroxine  88 mcg daily       Relevant Orders   TSH     Musculoskeletal and Integument   Osteoporosis   Dexa ordered No falls or fracture (remote history of comp fracture in spine) Has declined treatment so far (afraid of alendronate)  D level ordered Discussed fall prevention, supplements and exercise for bone density        Relevant Orders   VITAMIN D  25 Hydroxy (Vit-D Deficiency, Fractures)   DG Bone Density     Other   Vitamin D  deficiency   D level today  Oral supplementation  Important to bone and overall health      Relevant Orders   VITAMIN D  25 Hydroxy (Vit-D Deficiency, Fractures)   Routine general medical examination at a health  care facility - Primary   Reviewed health habits including diet and exercise and skin cancer prevention Reviewed appropriate screening tests for age  Also reviewed health mt list, fam hx and immunization status , as well as social and family history   See HPI Labs reviewed and ordered Health Maintenance  Topic Date Due   Breast Cancer Screening  05/05/2022   Medicare Annual Wellness Visit  07/13/2023   DTaP/Tdap/Td vaccine (3 - Tdap) 08/11/2025*   COVID-19 Vaccine (6 - 2025-26 season) 08/27/2025*   Zoster (Shingles) Vaccine (1 of 2) 11/10/2025*   Pneumococcal Vaccine for age over 74  Completed   Flu Shot  Completed   DEXA scan (bone density measurement)  Completed   HPV Vaccine  Aged Out   Meningitis B Vaccine  Aged Out  *Topic was postponed. The date shown is not the original due date.    Flu shot given  Encouraged to get Td and covid shots at pharmacy Declines shingrix vaccine Mammogram and dexa ordered No falls or fractures Discussed fall prevention, supplements and exercise for bone density  PHQ 0       Hyperlipidemia   Disc goals for lipids and reasons to control them Rev last labs with pt Rev low sat fat diet in detail  Diet controlled  Labs today         Relevant Orders   Comprehensive metabolic panel with GFR   Lipid Panel   History of atrial fibrillation   Once in hospital in 2022 with pna  Never re occurred   Continues Cardizem  CD 120 mg daily       Encounter for screening mammogram for breast cancer   Mammogram ordered Pt will call to schedule       Relevant Orders   MM 3D SCREENING MAMMOGRAM BILATERAL BREAST   B12 deficiency   B12 level today  Oral supplementation       Relevant Orders   Vitamin B12   Other Visit  Diagnoses       Need for influenza vaccination       Relevant Orders   Flu vaccine HIGH DOSE PF(Fluzone Trivalent) (Completed)

## 2024-08-11 NOTE — Patient Instructions (Addendum)
 Flu shot today   You can update your tetanus shot at the pharmacy      You have an order for:  [x]   3D Mammogram  [x]   Bone Density     Please call for appointment:   [x]   [facility]  [address] [place] [address]  [phone]  []   [facility])   [address]  [place], [place] [address]  [phone]  []   [facility]      [address] [place], [place]        [phone]         []   [facility]  [address] [place], [place]  [phone]  []  [facility]   [address]   [place], [place] [address]  [phone]  []  [facility]  [address] [place], [place] [address] [phone]    Make sure to wear two piece clothing  No lotions powders or deodorants the day of the appointment Make sure to bring picture ID and insurance card.  Bring list of medications you are currently taking including any supplements.   Schedule your screening mammogram through MyChart!   Select [place] imaging sites can now be scheduled through MyChart.  Log into your MyChart account.  Go to 'Visit' (or 'Appointments' if  on mobile App) --> Schedule an  Appointment  Under 'Select a Reason for Visit' choose the Mammogram  Screening option.  Complete the pre-visit questions  and select the time and place that  best fits your schedule

## 2024-08-11 NOTE — Assessment & Plan Note (Signed)
TSH ordered  No clinical changes  Taking levothyroxine 88 mcg daily

## 2024-08-11 NOTE — Assessment & Plan Note (Addendum)
 Dexa ordered No falls or fracture (remote history of comp fracture in spine) Has declined treatment so far (afraid of alendronate)  D level ordered Discussed fall prevention, supplements and exercise for bone density

## 2024-08-11 NOTE — Assessment & Plan Note (Signed)
B12 level today  Oral supplementation

## 2024-08-11 NOTE — Assessment & Plan Note (Signed)
 Reviewed health habits including diet and exercise and skin cancer prevention Reviewed appropriate screening tests for age  Also reviewed health mt list, fam hx and immunization status , as well as social and family history   See HPI Labs reviewed and ordered Health Maintenance  Topic Date Due   Breast Cancer Screening  05/05/2022   Medicare Annual Wellness Visit  07/13/2023   DTaP/Tdap/Td vaccine (3 - Tdap) 08/11/2025*   COVID-19 Vaccine (6 - 2025-26 season) 08/27/2025*   Zoster (Shingles) Vaccine (1 of 2) 11/10/2025*   Pneumococcal Vaccine for age over 75  Completed   Flu Shot  Completed   DEXA scan (bone density measurement)  Completed   HPV Vaccine  Aged Out   Meningitis B Vaccine  Aged Out  *Topic was postponed. The date shown is not the original due date.    Flu shot given  Encouraged to get Td and covid shots at pharmacy Declines shingrix vaccine Mammogram and dexa ordered No falls or fractures Discussed fall prevention, supplements and exercise for bone density  PHQ 0

## 2024-08-11 NOTE — Assessment & Plan Note (Signed)
 D level today  Oral supplementation  Important to bone and overall health

## 2024-08-11 NOTE — Assessment & Plan Note (Signed)
 Mammogram ordered Pt will call to schedule

## 2024-08-12 ENCOUNTER — Ambulatory Visit: Payer: Self-pay | Admitting: Family Medicine

## 2024-08-12 LAB — COMPREHENSIVE METABOLIC PANEL WITH GFR
ALT: 11 U/L (ref 0–35)
AST: 17 U/L (ref 0–37)
Albumin: 4.2 g/dL (ref 3.5–5.2)
Alkaline Phosphatase: 63 U/L (ref 39–117)
BUN: 25 mg/dL — ABNORMAL HIGH (ref 6–23)
CO2: 30 meq/L (ref 19–32)
Calcium: 9.6 mg/dL (ref 8.4–10.5)
Chloride: 104 meq/L (ref 96–112)
Creatinine, Ser: 0.76 mg/dL (ref 0.40–1.20)
GFR: 69.39 mL/min (ref >60.00)
Glucose, Bld: 79 mg/dL (ref 70–99)
Potassium: 4.5 meq/L (ref 3.5–5.1)
Sodium: 141 meq/L (ref 135–145)
Total Bilirubin: 0.3 mg/dL (ref 0.2–1.2)
Total Protein: 6.5 g/dL (ref 6.0–8.3)

## 2024-08-12 LAB — LIPID PANEL
Cholesterol: 197 mg/dL (ref 0–200)
HDL: 61.4 mg/dL (ref >39.00)
LDL Cholesterol: 110 mg/dL — ABNORMAL HIGH (ref 0–99)
NonHDL: 136.04
Total CHOL/HDL Ratio: 3
Triglycerides: 131 mg/dL (ref 0.0–149.0)
VLDL: 26.2 mg/dL (ref 0.0–40.0)

## 2024-08-12 LAB — TSH: TSH: 2.02 u[IU]/mL (ref 0.35–5.50)

## 2024-08-12 LAB — VITAMIN D 25 HYDROXY (VIT D DEFICIENCY, FRACTURES): VITD: 88.36 ng/mL (ref 30.00–100.00)

## 2024-08-12 LAB — VITAMIN B12: Vitamin B-12: 492 pg/mL (ref 211–911)

## 2024-08-12 MED ORDER — SYNTHROID 88 MCG PO TABS: ORAL_TABLET | ORAL | 3 refills | Status: AC

## 2024-09-15 ENCOUNTER — Emergency Department

## 2024-09-15 ENCOUNTER — Encounter: Payer: Self-pay | Admitting: Emergency Medicine

## 2024-09-15 ENCOUNTER — Ambulatory Visit
Admission: RE | Admit: 2024-09-15 | Discharge: 2024-09-15 | Disposition: A | Discharge status: Home / Self Care | Source: Ambulatory Visit | Attending: Family Medicine | Admitting: Family Medicine

## 2024-09-15 ENCOUNTER — Telehealth: Payer: Self-pay | Admitting: *Deleted

## 2024-09-15 ENCOUNTER — Other Ambulatory Visit: Payer: Self-pay

## 2024-09-15 ENCOUNTER — Emergency Department
Admission: EM | Admit: 2024-09-15 | Discharge: 2024-09-15 | Disposition: A | Discharge status: Home / Self Care | Source: Ambulatory Visit

## 2024-09-15 DIAGNOSIS — I4891 Unspecified atrial fibrillation: Secondary | ICD-10-CM | POA: Diagnosis not present

## 2024-09-15 DIAGNOSIS — S0001XA Abrasion of scalp, initial encounter: Secondary | ICD-10-CM | POA: Insufficient documentation

## 2024-09-15 DIAGNOSIS — W19XXXA Unspecified fall, initial encounter: Secondary | ICD-10-CM

## 2024-09-15 DIAGNOSIS — W1839XA Other fall on same level, initial encounter: Secondary | ICD-10-CM | POA: Insufficient documentation

## 2024-09-15 DIAGNOSIS — M810 Age-related osteoporosis without current pathological fracture: Secondary | ICD-10-CM | POA: Diagnosis present

## 2024-09-15 DIAGNOSIS — Z1231 Encounter for screening mammogram for malignant neoplasm of breast: Secondary | ICD-10-CM

## 2024-09-15 DIAGNOSIS — S0990XA Unspecified injury of head, initial encounter: Secondary | ICD-10-CM | POA: Diagnosis present

## 2024-09-15 DIAGNOSIS — N3000 Acute cystitis without hematuria: Secondary | ICD-10-CM | POA: Diagnosis not present

## 2024-09-15 LAB — COMPREHENSIVE METABOLIC PANEL WITH GFR
ALT: 17 U/L (ref 0–44)
AST: 23 U/L (ref 15–41)
Albumin: 4 g/dL (ref 3.5–5.0)
Alkaline Phosphatase: 63 U/L (ref 38–126)
Anion gap: 11 (ref 5–15)
BUN: 26 mg/dL — ABNORMAL HIGH (ref 8–23)
CO2: 23 mmol/L (ref 22–32)
Calcium: 9.3 mg/dL (ref 8.9–10.3)
Chloride: 103 mmol/L (ref 98–111)
Creatinine, Ser: 0.78 mg/dL (ref 0.44–1.00)
GFR, Estimated: >60 mL/min (ref >60)
Glucose, Bld: 91 mg/dL (ref 70–99)
Potassium: 4.3 mmol/L (ref 3.5–5.1)
Sodium: 137 mmol/L (ref 135–145)
Total Bilirubin: 0.5 mg/dL (ref 0.0–1.2)
Total Protein: 7.2 g/dL (ref 6.5–8.1)

## 2024-09-15 LAB — URINALYSIS, ROUTINE W REFLEX MICROSCOPIC
Bilirubin Urine: NEGATIVE
Glucose, UA: NEGATIVE mg/dL
Hgb urine dipstick: NEGATIVE
Ketones, ur: NEGATIVE mg/dL
Leukocytes,Ua: NEGATIVE
Nitrite: POSITIVE — AB
Protein, ur: NEGATIVE mg/dL
Specific Gravity, Urine: 1.009 (ref 1.005–1.030)
pH: 8 (ref 5.0–8.0)

## 2024-09-15 LAB — TROPONIN I (HIGH SENSITIVITY)
Troponin I (High Sensitivity): 4 ng/L (ref <18)
Troponin I (High Sensitivity): 5 ng/L (ref <18)

## 2024-09-15 LAB — CBC
HCT: 38.4 % (ref 36.0–46.0)
Hemoglobin: 13.1 g/dL (ref 12.0–15.0)
MCH: 31.8 pg (ref 26.0–34.0)
MCHC: 34.1 g/dL (ref 30.0–36.0)
MCV: 93.2 fL (ref 80.0–100.0)
Platelets: 186 K/uL (ref 150–400)
RBC: 4.12 MIL/uL (ref 3.87–5.11)
RDW: 12.4 % (ref 11.5–15.5)
WBC: 7 K/uL (ref 4.0–10.5)
nRBC: 0 % (ref 0.0–0.2)

## 2024-09-15 MED ORDER — TETANUS-DIPHTH-ACELL PERTUSSIS 5-2-15.5 LF-MCG/0.5 IM SUSP
0.5000 mL | Freq: Once | INTRAMUSCULAR | Status: AC
  Administered 2024-09-15: 0.5 mL via INTRAMUSCULAR
  Filled 2024-09-15: qty 0.5

## 2024-09-15 MED ORDER — ACETAMINOPHEN 500 MG PO TABS: 1000.0000 mg | ORAL_TABLET | Freq: Four times a day (QID) | ORAL | 2 refills | Status: AC | PRN

## 2024-09-15 MED ORDER — ACETAMINOPHEN 325 MG PO TABS
650.0000 mg | ORAL_TABLET | Freq: Once | ORAL | Status: AC
  Administered 2024-09-15: 650 mg via ORAL
  Filled 2024-09-15: qty 2

## 2024-09-15 MED ORDER — LIDOCAINE 5 % EX PTCH: 1.0000 | MEDICATED_PATCH | CUTANEOUS | 0 refills | Status: AC

## 2024-09-15 MED ORDER — CEPHALEXIN 500 MG PO CAPS: 500.0000 mg | ORAL_CAPSULE | Freq: Two times a day (BID) | ORAL | 0 refills | Status: AC

## 2024-09-15 MED ORDER — CEPHALEXIN 500 MG PO CAPS
500.0000 mg | ORAL_CAPSULE | Freq: Once | ORAL | Status: AC
  Administered 2024-09-15: 500 mg via ORAL
  Filled 2024-09-15: qty 1

## 2024-09-15 NOTE — ED Provider Notes (Signed)
 [facility] Provider Note    Event Date/Time   First MD Initiated Contact with Patient 09/15/24 2158     (approximate)   History   Fall  Pt to ED via POV, Pt was leaving from an appt and leaned up against the door, falling and hitting her head. Pt did not have LOC, pt is on blood thinners. Pt has small laceration on the top of her head, bleeding is controlled at this time.    HPI Carla Kelly is a 89 y.o. female PMH hyperlipidemia, fibromyalgia, A-fib, IBS, prior stroke presents for evaluation after fall - Patient was standing outside clinic building today when a door was pushed open that hit her and caused her to fall to the ground.  Did hit the top of her head.  No LOC.  Not on blood thinners.  Complains of some headache and left hip pain since then.  Has been ambulatory with her walker which is at her baseline.  Feeling better after Tylenol  she received earlier today at triage. - Otherwise been in her usual state of health.  No infectious symptoms.  Does note she is chronically incontinent of urine.       Physical Exam   Triage Vital Signs: ED Triage Vitals  Encounter Vitals Group     BP 09/15/24 1547 (!) 135/93     Girls Systolic BP Percentile --      Girls Diastolic BP Percentile --      Boys Systolic BP Percentile --      Boys Diastolic BP Percentile --      Pulse Rate 09/15/24 1547 83     Resp 09/15/24 1547 16     Temp 09/15/24 1547 97.9 F (36.6 C)     Temp Source 09/15/24 1547 Oral     SpO2 09/15/24 1547 96 %     Weight 09/15/24 1543 150 lb (68 kg)     Height 09/15/24 1543 5' 4 (1.626 m)     Head Circumference --      Peak Flow --      Pain Score 09/15/24 1542 6     Pain Loc --      Pain Education --      Exclude from Growth Chart --     Most recent vital signs: Vitals:   09/15/24 2055 09/15/24 2057  BP: (!) 151/72   Pulse: 90   Resp: 18   Temp:  98.8 F (37.1 C)  SpO2: 96%      General: Awake, no distress.   HEENT: Small superficial abrasion to top of scalp, no significant underlying laceration to repair.  Not grossly contaminated.  Hemostatic. CV:  Good peripheral perfusion. RRR, RP 2+ Resp:  Normal effort. CTAB Abd:  No distention. Nontender to deep palpation throughout Other:  Mild tenderness palpation over left hip though able to bear weight, no limb length discrepancy, full range of motion of all joints of left lower extremity, no abnormal rotation.  Otherwise does have mild bruising over left distal index finger, full range of motion, no rotational deformity appreciated, no wounds.   ED Results / Procedures / Treatments   Labs (all labs ordered are listed, but only abnormal results are displayed) Labs Reviewed  COMPREHENSIVE METABOLIC PANEL WITH GFR - Abnormal; Notable for the following components:      Result Value   BUN 26 (*)    All other components within normal limits  URINALYSIS, ROUTINE W REFLEX MICROSCOPIC - Abnormal; Notable  for the following components:   Color, Urine YELLOW (*)    APPearance HAZY (*)    Nitrite POSITIVE (*)    Bacteria, UA MANY (*)    All other components within normal limits  CBC  TROPONIN I (HIGH SENSITIVITY)  TROPONIN I (HIGH SENSITIVITY)     EKG  Ecg = sinus rhythm, rate 84, no gross ST elevation or depression, no significant repolarization abnormality, axis, normal intervals.  No clear evidence of ischemia no arrhythmia my interpretation.   RADIOLOGY Radiology interpreted by myself and radiology reports reviewed.  No acute pathology identified.    PROCEDURES:  Critical Care performed: No  Procedures   MEDICATIONS ORDERED IN ED: Medications  acetaminophen  (TYLENOL ) tablet 650 mg (650 mg Oral Given 09/15/24 2054)  Tdap (ADACEL) injection 0.5 mL (0.5 mLs Intramuscular Given 09/15/24 2247)  cephALEXin  (KEFLEX ) capsule 500 mg (500 mg Oral Given 09/15/24 2249)     IMPRESSION / MDM / ASSESSMENT AND PLAN / ED COURSE  I reviewed the  triage vital signs and the nursing notes.                              DDX/MDM/AP: Differential diagnosis includes, but is not limited to, current mechanical fall, consider underlying UTI, electrolyte abnormality, anemia that may have contributed though not strongly suggested by history.  Consider intracranial hemorrhage, skull fracture, C-spine fracture, hip fracture.  Does also note some pain in her left distal index finger as well as her left elbow, consider underlying fractures.  Plan: - Labs - X-ray left elbow, hand, hip - CT head, CT C-spine - Tylenol  - EKG  Patient's presentation is most consistent with acute presentation with potential threat to life or bodily function.  The patient is on the cardiac monitor to evaluate for evidence of arrhythmia and/or significant heart rate changes.  ED course below.  Workup with no traumatic injuries.  Laboratory results overall reassuring though does have evidence of UTI, treating with Keflex .  Tdap updated.  Presentation overall most consistent with mechanical fall, ambulating well here in emergency department, do not suspect underlying occult fracture not visualized on x-rays.  Rx Keflex , Tylenol , Lidoderm  patches with plan for PMD follow-up.  ED return precautions in place.  Patient and family agree with plan.  Clinical Course as of 09/15/24 2250  Tue Sep 15, 2024  2226 CBC and CMP reviewed, unremarkable.  BUN mildly elevated, similar to prior. [MM]  2226 Serial troponins normal [MM]  2226 Urinalysis concerning for UTI [MM]  2227 X-rays reviewed, radiology reports reviewed.  No acute pathology identified. [MM]  2227 CT head and CT C-spine reviewed, no acute pathology identified, radiology reports reviewed and in agreement [MM]    Clinical Course User Index [MM] Clarine Ozell LABOR, MD     FINAL CLINICAL IMPRESSION(S) / ED DIAGNOSES   Final diagnoses:  Fall, initial encounter  Abrasion of scalp, initial encounter  Acute cystitis  without hematuria     Rx / DC Orders   ED Discharge Orders          Ordered    cephALEXin  (KEFLEX ) 500 MG capsule  2 times daily        09/15/24 2249    acetaminophen  (TYLENOL ) 500 MG tablet  Every 6 hours PRN        09/15/24 2249    lidocaine  (LIDODERM ) 5 %  Every 24 hours        09/15/24 2249  Note:  This document was prepared using Dragon voice recognition software and may include unintentional dictation errors.   Clarine Ozell LABOR, MD 09/15/24 2250

## 2024-09-15 NOTE — ED Triage Notes (Signed)
 Pt to ED via POV, Pt was leaving from an appt and leaned up against the door, falling and hitting her head. Pt did not have LOC, pt is on blood thinners. Pt has small laceration on the top of her head, bleeding is controlled at this time.

## 2024-09-15 NOTE — Telephone Encounter (Signed)
 Pt is at a [facility] hospital they have access to her med list and will give her d/c paper work with update med list once she is d/c, if daughter calls back mychart # is [phone]** please provide info if pt calls back

## 2024-09-15 NOTE — ED Notes (Signed)
 Pt discharged to home, instructions and medications reviewed.  Pt and family verbalized understanding, no questions at this time.

## 2024-09-15 NOTE — Telephone Encounter (Signed)
 Copied from CRM [id number]. Topic: General - Call Back - No Documentation >> Sep 15, 2024  3:18 PM Alfonso ORN wrote: Reason for CRM: pt daughter is calling on behalf of pt she has just been admitted to ER today for falling and and hit her head and hip. Patient asking for help to get list of her medications to provide and will call back for mychart help.

## 2024-09-15 NOTE — Discharge Instructions (Addendum)
 Your evaluation in the emergency department is overall reassuring.  Fortunately we saw no traumatic injuries from your fall, though we did see an underlying urinary tract infection which we are treating with an antibiotic.  Please take the full course as prescribed.  I have also prescribed Tylenol  and Lidoderm  patches to use as needed for any ongoing discomfort.  Please do follow-up with your primary care provider for reevaluation, and return to the emergency department with any new or worsening symptoms.

## 2024-09-15 NOTE — ED Provider Triage Note (Signed)
 Emergency Medicine Provider Triage Evaluation Note  Carla Kelly , a 89 y.o. female  was evaluated in triage.  Pt complains of head injury after leaving her mammogram appointment and leaning up against the door when she fell and hit her head, left elbow, left hand and left hip.  Patient is on blood thinners.  Patient did not have lost consciousness is now feeling dizzy.  Lac to top of head and mid parietal region, bleeding controlled.   Physical Exam  BP (!) 135/93 (BP Location: Left Arm)   Pulse 83   Temp 97.9 F (36.6 C) (Oral)   Resp 16   Ht 5' 4 (1.626 m)   Wt 68 kg   SpO2 96%   BMI 25.75 kg/m  Gen:   Awake, no distress   Resp:  Normal effort  MSK:   Moves extremities without difficulty  Other:  2-3 cm lac to mid parietal region, bleeding controlled. Abrasion to left elbow. Left pointer finger DIP with slight deformity. Able to move hip through normal ROM but endorses some slight tenderness.  Medical Decision Making  Medically screening exam initiated at 3:49 PM.  Appropriate orders placed.  Olena JAYSON Space was informed that the remainder of the evaluation will be completed by another provider, this initial triage assessment does not replace that evaluation, and the importance of remaining in the ED until their evaluation is complete.  UA, CBG, CBC,CMP, Troponin, EKG, CT head and cervical spine, x rays of left hand, hip and elbow ordered.   Sheron Bressler, [place] 09/15/24 [phone]

## 2024-09-18 NOTE — Telephone Encounter (Signed)
 Already spoke to the patient and her daughter, they will be coming in person on Thursday 10.30.25  Copied from CRM #[id number]. Topic: Appointments - Scheduling Inquiry for Clinic >> Sep 17, 2024  4:32 PM Alfonso ORN wrote: Reason for CRM: pt had recent Emergency visit for a bad fall. She is wanting to confirm if she is able to do a video appt for next week for f/u or if she needs to be seen in person. Please advise >> Sep 18, 2024  1:26 PM Robinson H wrote: Patient states she's returning a call to Amy regarding her appointment, no messages for patient. Patient states she does better with emails or MyChart than calls.

## 2024-09-18 NOTE — Telephone Encounter (Signed)
 Spoke to patient, she does not want to be virtual, wants an in person appointment will be here in person on Thursday at noon.

## 2024-09-18 NOTE — Telephone Encounter (Signed)
 I prefer in person so I can examine her  If that is not possible, we can start virtually  If that is the case, make sure insurance is ok for virtual

## 2024-09-18 NOTE — Telephone Encounter (Unsigned)
 Copied from CRM [id number]. Topic: Appointments - Scheduling Inquiry for Clinic >> Sep 17, 2024  4:32 PM Alfonso ORN wrote: Reason for CRM: pt had recent Emergency visit for a bad fall. She is wanting to confirm if she is able to do a video appt for next week for f/u or if she needs to be seen in person. Please advise

## 2024-09-24 ENCOUNTER — Ambulatory Visit: Admitting: Family Medicine

## 2024-09-24 ENCOUNTER — Encounter: Payer: Self-pay | Admitting: Family Medicine

## 2024-09-24 VITALS — BP 116/62 | HR 77 | Temp 97.6°F | Ht 64.0 in | Wt 161.5 lb

## 2024-09-24 DIAGNOSIS — S7002XD Contusion of left hip, subsequent encounter: Secondary | ICD-10-CM

## 2024-09-24 DIAGNOSIS — W19XXXD Unspecified fall, subsequent encounter: Secondary | ICD-10-CM

## 2024-09-24 DIAGNOSIS — S0990XS Unspecified injury of head, sequela: Secondary | ICD-10-CM

## 2024-09-24 DIAGNOSIS — S7012XD Contusion of left thigh, subsequent encounter: Secondary | ICD-10-CM | POA: Diagnosis not present

## 2024-09-24 DIAGNOSIS — S7002XS Contusion of left hip, sequela: Secondary | ICD-10-CM | POA: Diagnosis not present

## 2024-09-24 DIAGNOSIS — S0990XA Unspecified injury of head, initial encounter: Secondary | ICD-10-CM | POA: Insufficient documentation

## 2024-09-24 DIAGNOSIS — W19XXXS Unspecified fall, sequela: Secondary | ICD-10-CM | POA: Diagnosis not present

## 2024-09-24 DIAGNOSIS — M810 Age-related osteoporosis without current pathological fracture: Secondary | ICD-10-CM

## 2024-09-24 DIAGNOSIS — S0990XD Unspecified injury of head, subsequent encounter: Secondary | ICD-10-CM

## 2024-09-24 DIAGNOSIS — S7012XS Contusion of left thigh, sequela: Secondary | ICD-10-CM

## 2024-09-24 DIAGNOSIS — W19XXXA Unspecified fall, initial encounter: Secondary | ICD-10-CM | POA: Insufficient documentation

## 2024-09-24 DIAGNOSIS — S7000XA Contusion of unspecified hip, initial encounter: Secondary | ICD-10-CM | POA: Insufficient documentation

## 2024-09-24 MED ORDER — ALENDRONATE SODIUM 70 MG PO TABS: 70.0000 mg | ORAL_TABLET | ORAL | 3 refills | Status: AC

## 2024-09-24 NOTE — Patient Instructions (Addendum)
 Watch for signs and symptoms of concussion   Headache Dizziness Nausea Confusion  Personality change  Trouble concentrating   Any symptoms that worsen - may need CT scan again - call asap  If severe-go to ER  Otherwise symptoms should continue to improve   Keep the scalp wound clean with soap and water (call if you have any problems)   Take breaks if you feel fatigued   Use ice on sore areas (head / hip)   Pedaling is ok if it does not hurt hip  I completed handicapped parking form  I want to try alendronate  (fosamax) weekly for osteoporosis  See the handout  If well tolerated -5 years  If any side effects - stop it and let us  know

## 2024-09-24 NOTE — Assessment & Plan Note (Signed)
 Interested in treatment Reviewed dexa Discussed fall prevention, supplements and exercise for bone density  Handout given   Trial of alendronate weekly  Discussed way to take  Opt 5 y if tolerated  Will call if side effects   Dexa in 2 years

## 2024-09-24 NOTE — Assessment & Plan Note (Signed)
 Fell when hit by sliding door-hit head on trash can Reviewed hospital records, lab results and studies in detail   Overall reassuring exam and recovery  Discussed fall prevention in detail

## 2024-09-24 NOTE — Progress Notes (Signed)
 Subjective:    Patient ID: Carla Kelly, female    DOB: [birth year], 89 y.o.   MRN: [id number]  HPI  Wt Readings from Last 3 Encounters:  09/24/24 161 lb 8 oz (73.3 kg)  09/15/24 150 lb (68 kg)  08/11/24 158 lb 6 oz (71.8 kg)   27.72 kg/m  Vitals:   09/24/24 1155  BP: 116/62  Pulse: 77  Temp: 97.6 F (36.4 C)  SpO2: 97%     Here for follow up of fall and ER visit 09/15/24  Per history pt was leaning against a door outside of a clinic-it opened and she fell to ground hitting the top of her head (fell against a trash can)  Small laceration - head Some hip pain /elbow and hand   Tetanus shot updated    Labs Lab Results  Component Value Date   NA 137 09/15/2024   K 4.3 09/15/2024   CO2 23 09/15/2024   GLUCOSE 91 09/15/2024   BUN 26 (H) 09/15/2024   CREATININE 0.78 09/15/2024   CALCIUM  9.3 09/15/2024   GFR 69.39 08/11/2024   GFRNONAA >60 09/15/2024   Lab Results  Component Value Date   ALT 17 09/15/2024   AST 23 09/15/2024   ALKPHOS 63 09/15/2024   BILITOT 0.5 09/15/2024   Lab Results  Component Value Date   WBC 7.0 09/15/2024   HGB 13.1 09/15/2024   HCT 38.4 09/15/2024   MCV 93.2 09/15/2024   PLT 186 09/15/2024   Normal troponin 1  Urinalysis noted nitrite and bacteria No culture was sent  Imaging reassuring  Head CT EXAM: CT HEAD WITHOUT CONTRAST   TECHNIQUE: Contiguous axial images were obtained from the base of the skull through the vertex without intravenous contrast.   RADIATION DOSE REDUCTION: This exam was performed according to the departmental dose-optimization program which includes automated exposure control, adjustment of the mA and/or kV according to patient size and/or use of iterative reconstruction technique.   COMPARISON:  None Available.   FINDINGS: Brain: Ventricles, cisterns and other CSF spaces are normal. Evidence of minimal chronic ischemic microvascular disease. No mass, mass effect, shift of midline structures  or acute hemorrhage. No evidence of acute infarction.   Vascular: No hyperdense vessel or unexpected calcification.   Skull: Normal. Negative for fracture or focal lesion.   Sinuses/Orbits: Sinuses and orbits are normal.   Other: Small laceration over the midline posterior parietal scalp.   IMPRESSION: 1. No acute intracranial findings. 2. Minimal chronic ischemic microvascular disease.  Elbow Narrative & Impression  CLINICAL DATA:  Left elbow pain following a fall.   EXAM: LEFT ELBOW - COMPLETE 3+ VIEW   COMPARISON:  None Available.   FINDINGS: There is no evidence of fracture, dislocation, or joint effusion. There is no evidence of arthropathy or other focal bone abnormality. Soft tissues are unremarkable.   IMPRESSION: Negative.    Hip DG Hip Unilat W or Wo Pelvis 2-3 Views Left (Accession [id number]) (Order [id number]) Imaging Date: 09/15/2024 Department: [facility] Emergency Department at [facility] Released By: Silver Jon BIRCH, RN (auto-released) Authorizing: Sheron Salm, PA-C   Exam Status  Status  Final [99]   PACS Intelerad Image Link   Show images for DG Hip Unilat W or Wo Pelvis 2-3 Views Left Related Results          DG Elbow Complete Left Final result 09/15/2024 4:44 PM      CLINICAL DATA:  Left elbow pain following a fall.  EXAM: LEFT  ELBOW - COMPLETE 3+ VIEW  COMPARISON:  None Available.  FINDINGS: There is no evidence of fracture, dislocation, or joint effusion. There is no evidence of arthropathy or other focal bone abnormality. ...     DG Hand 2 View Left Final result 09/15/2024 4:44 PM      CLINICAL DATA:  Left hand pain following a fall.  EXAM: LEFT HAND - 2 VIEW  COMPARISON:  None Available.  FINDINGS: PA and lateral views of the left hand demonstrate 1st metacarpal/carpal degenerative changes. There are also degenerative ...   Study Result  Narrative & Impression  CLINICAL DATA:  Left hip pain following  a fall.   EXAM: DG HIP (WITH OR WITHOUT PELVIS) 2-3V LEFT   COMPARISON:  Abdomen and pelvis CT dated 09/23/2020.   FINDINGS: Diffuse osteopenia. No hip fracture or dislocation. Lower lumbar spine degenerative changes.   IMPRESSION: 1. No fracture or dislocation. 2. Diffuse osteopenia.     Hand Narrative & Impression  CLINICAL DATA:  Left hand pain following a fall.   EXAM: LEFT HAND - 2 VIEW   COMPARISON:  None Available.   FINDINGS: PA and lateral views of the left hand demonstrate 1st metacarpal/carpal degenerative changes. There are also degenerative changes involving the 2nd D IP joint, 3rd D IP and PIP joints and 4th D IP joint. Diffuse osteopenia. No fracture or dislocations seen.   IMPRESSION: 1. No fracture or dislocation. 2. Degenerative changes, as described above.    CT CS Narrative & Impression  CLINICAL DATA:  Neck trauma (Age >= 65y)   Status post fall with head injury.  On blood thinners.   EXAM: CT CERVICAL SPINE WITHOUT CONTRAST   TECHNIQUE: Multidetector CT imaging of the cervical spine was performed without intravenous contrast. Multiplanar CT image reconstructions were also generated.   RADIATION DOSE REDUCTION: This exam was performed according to the departmental dose-optimization program which includes automated exposure control, adjustment of the mA and/or kV according to patient size and/or use of iterative reconstruction technique.   COMPARISON:  Limited correlation made with thoracic MRI 10/18/2020.   FINDINGS: Alignment: Straightening without focal angulation or listhesis.   Skull base and vertebrae: No evidence of acute cervical spine fracture or traumatic subluxation. Evidence of multilevel interbody ankylosis involving at least C5 through C7. Associated interfacetal ankylosis, most convincing on the right at C5-6 and on the left at C4-5. Degenerative changes anteriorly at C1-2.   Soft tissues and spinal canal: No  prevertebral fluid or swelling. No visible canal hematoma.   Disc levels: Multilevel disc space narrowing and facet arthropathy with interbody and interfacetal ankylosis as described above. No large disc herniation or high-grade foraminal narrowing demonstrated.   Upper chest: Biapical scarring.   Other: Bilateral carotid atherosclerosis.   IMPRESSION: 1. No evidence of acute cervical spine fracture, traumatic subluxation or static signs of instability. 2. Multilevel cervical spondylosis withevidence of multilevel interbody and interfacetal ankylosis.    Today  Head is improved (did have headaches)  No dizziness or nausea  No problems with concentration   Scab on scalp is healing    Left hip is still sore (fell on her cell phone)  - using voltaren gel (has a bruise) -anxious to get back on her bike       Patient Active Problem List   Diagnosis Date Noted   Fall 09/24/2024   Closed head injury 09/24/2024   Contusion of hip and thigh 09/24/2024   Chest discomfort 01/14/2024   Aortic atherosclerosis 04/20/2021  History of atrial fibrillation 03/22/2021   Constipation 10/03/2020   Closed compression fracture of L1 vertebra (HCC) 09/26/2020   Hearing loss 07/06/2020   Hammer toe of right foot 03/12/2018   Encounter for screening mammogram for breast cancer 03/04/2017   Routine general medical examination at a health care facility 02/12/2016   Estrogen deficiency 02/15/2015   Arthritis of right knee 08/13/2014   Status post total right knee replacement 08/13/2014   Transient global amnesia 02/20/2014   Encounter for Medicare annual wellness exam 12/07/2013   Leg edema 03/11/2012   B12 deficiency 09/05/2009   Vitamin D  deficiency 11/08/2008   Hyperlipidemia 08/25/2008   Osteoporosis 08/25/2008   History of colonic polyps 08/25/2008   HERPES ZOSTER, HX OF 06/03/2008   Hypothyroidism 08/04/2007   IRRITABLE BOWEL SYNDROME 08/04/2007   VAGINITIS, ATROPHIC 08/04/2007    Osteoarthrosis, unspecified whether generalized or localized, unspecified site 08/04/2007   FIBROMYALGIA 08/04/2007   URINARY INCONTINENCE 08/04/2007   Asthma 08/04/2007   Past Medical History:  Diagnosis Date   Adenomatous colon polyp    Allergy years ago   alcohol by mouth   Anemia    What is iron level???   Asthma    Cataract surgery   Both eyes   Depression    Disorder of bone and cartilage, unspecified    Embolism and thrombosis of unspecified site    Fibromyalgia    GERD (gastroesophageal reflux disease) 2020   small hiatal hernia   Heart murmur Mitral valve   History of herpes zoster    History of hiatal hernia    HLD (hyperlipidemia)    Hypoglycemia    Hypothyroidism    IBS (irritable bowel syndrome)    Mitral valve disorder    leaking mitral valve-very mild   Orbital floor fracture (HCC) 08/2015   with a fall    Osteoarthritis    Osteoarthrosis, unspecified whether generalized or localized, unspecified site    Other B-complex deficiencies    Psoriasis    Stroke (HCC)    Swelling of extremity, right    right leg   TGA (transient global amnesia) 01/2014   Unspecified urinary incontinence    Unspecified vitamin D  deficiency    Past Surgical History:  Procedure Laterality Date   ABDOMINAL HYSTERECTOMY  1970   fibroids; endometriosis   APPENDECTOMY     bladder tack  03/2004   BREAST BIOPSY Right 1995   BREAST CYST ASPIRATION Left 03/17/2015   CATARACT EXTRACTION Right 10/2012   Dr Jaye   CATARACT EXTRACTION W/PHACO Left 05/10/2015   Procedure: CATARACT EXTRACTION PHACO AND INTRAOCULAR LENS PLACEMENT (IOC);  Surgeon: Elsie Jaye, MD;  Location: [facility];  Service: Ophthalmology;  Laterality: Left;  US  00:59 AP% 26.7 CDE 15.85 fluid pack lot#[id number]   CERVICAL DISC SURGERY     x 2   CHOLECYSTECTOMY  2004   COLONOSCOPY  12/2000   polyps, BE-polyps   EYE SURGERY     right eye   JOINT REPLACEMENT     STILL HAVE PROBLEMS   KYPHOPLASTY  N/A 11/08/2020   Procedure: L1 Kyphoplasty;  Surgeon: Kathlynn Sharper, MD;  Location: [facility];  Service: Orthopedics;  Laterality: N/A;   MOUTH SURGERY     lower teeth have 4 studs to keep in place   SPINE SURGERY  fused C5/6, C6/7, L1 surgery   I was 89 years old, 38   TONSILLECTOMY     TOTAL KNEE ARTHROPLASTY Right 08/13/2014   Procedure: RIGHT TOTAL KNEE ARTHROPLASTY;  Surgeon: Lonni CINDERELLA Poli, MD;  Location: [facility];  Service: Orthopedics;  Laterality: Right;   TYMPANOPLASTY     US  TRANSVAGINAL PELVIC MODIFIED  08/2001   small left ovarian cyst   Social History   Tobacco Use   Smoking status: Never   Smokeless tobacco: Never   Tobacco comments:    2nd hand smoke from husbands-2 packs a day!  Vaping Use   Vaping status: Never Used  Substance Use Topics   Alcohol use: Never    Comment: is allergic   Drug use: Never   Family History  Problem Relation Age of Onset   Heart attack Mother    Stroke Mother        several   Alcohol abuse Mother    Arthritis Mother    Heart disease Mother    Other Sister        benign brain tumor   Varicose Veins Sister    Asthma Daughter    Breast cancer Daughter    Cancer Daughter    Breast cancer Maternal Aunt 22   Lupus Brother    Heart disease Brother    Other Brother        heart problems   Colon cancer Other        1st cousin   Allergies  Allergen Reactions   Alcohol-Sulfur [Elemental Sulfur] Shortness Of Breath and Swelling    Alcohol po swells throat   Ergocalciferol  Other (See Comments)    REACTION: GI side effects   Morphine  Nausea And Vomiting   Tolterodine Tartrate Other (See Comments)    REACTION: ? reaction   Alcohol Other (See Comments) and Swelling   Myrbetriq [Mirabegron] Other (See Comments)    GI Upset   Nortriptyline Hcl Other (See Comments)    unknown   Current Outpatient Medications on File Prior to Visit  Medication Sig Dispense Refill   acetaminophen  (TYLENOL ) 500 MG tablet Take 2 tablets  (1,000 mg total) by mouth every 6 (six) hours as needed. 100 tablet 2   albuterol  (PROVENTIL ) 4 MG tablet Take 1 tablet (4 mg total) by mouth every 4 (four) hours as needed for wheezing or shortness of breath. 30 tablet 5   Apoaequorin (PREVAGEN) 10 MG CAPS      aspirin  325 MG tablet Take 325 mg by mouth daily.     Calcium -Magnesium -Zinc (CAL-MAG-ZINC PO) Take 1 tablet by mouth in the morning, at noon, and at bedtime.     Cholecalciferol  (VITAMIN D3 GUMMIES PO) Take 2 tablets by mouth daily.     diclofenac Sodium (VOLTAREN) 1 % GEL Apply topically 4 (four) times daily.     diltiazem  (CARDIZEM  CD) 120 MG 24 hr capsule TAKE 1 CAPSULE(120 MG) BY MOUTH DAILY 90 capsule 0   furosemide  (LASIX ) 40 MG tablet TAKE 1/2-1 TABLET BY MOUTH EVERY DAY 90 tablet 3   Melatonin 10 MG TABS Take 1 tablet by mouth at bedtime.     Probiotic Product (PROBIOTIC PO) Take 1 capsule by mouth daily.     SYNTHROID  88 MCG tablet TAKE 1 TABLET BY MOUTH EVERY DAY IN THE MORNING BEFORE BREAKFAST 90 tablet 3   lidocaine  (LIDODERM ) 5 % Place 1 patch onto the skin daily for 10 days. Remove & Discard patch within 12 hours or as directed by MD (Patient not taking: Reported on 09/24/2024) 10 patch 0   No current facility-administered medications on file prior to visit.    Review of Systems  Constitutional:  Negative for activity  change, appetite change, fatigue, fever and unexpected weight change.  HENT:  Negative for congestion, ear pain, rhinorrhea, sinus pressure and sore throat.   Eyes:  Negative for pain, redness and visual disturbance.  Respiratory:  Negative for cough, shortness of breath and wheezing.   Cardiovascular:  Negative for chest pain and palpitations.  Gastrointestinal:  Negative for abdominal pain, blood in stool, constipation and diarrhea.  Endocrine: Negative for polydipsia and polyuria.  Genitourinary:  Negative for dysuria, frequency and urgency.  Musculoskeletal:  Positive for arthralgias. Negative for  back pain and myalgias.  Skin:  Positive for wound. Negative for pallor and rash.  Allergic/Immunologic: Negative for environmental allergies.  Neurological:  Positive for headaches. Negative for dizziness, tremors, seizures, syncope, facial asymmetry, speech difficulty, weakness, light-headedness and numbness.       Headache is much improved   Hematological:  Negative for adenopathy. Does not bruise/bleed easily.  Psychiatric/Behavioral:  Negative for decreased concentration and dysphoric mood. The patient is not nervous/anxious.        Objective:   Physical Exam Constitutional:      General: She is not in acute distress.    Appearance: Normal appearance. She is well-developed and normal weight. She is not ill-appearing or diaphoretic.  HENT:     Head: Normocephalic and atraumatic.  Eyes:     General: No visual field deficit.    Conjunctiva/sclera: Conjunctivae normal.     Pupils: Pupils are equal, round, and reactive to light.  Neck:     Thyroid : No thyromegaly.     Vascular: No carotid bruit or JVD.  Cardiovascular:     Rate and Rhythm: Normal rate and regular rhythm.     Heart sounds: Normal heart sounds.     No gallop.  Pulmonary:     Effort: Pulmonary effort is normal. No respiratory distress.     Breath sounds: Normal breath sounds. No wheezing or rales.  Abdominal:     General: There is no distension or abdominal bruit.     Palpations: Abdomen is soft.  Musculoskeletal:     Cervical back: Normal range of motion and neck supple.     Right lower leg: No edema.     Left lower leg: No edema.     Comments: Hematoma left lateral hip and thigh- turning yellow  Mildly tender    Normal rom hip and LE   Mild kyphosis     Lymphadenopathy:     Cervical: No cervical adenopathy.  Skin:    General: Skin is warm and dry.     Coloration: Skin is not pale.     Findings: No rash.     Comments: V shaped scab on top of head  Healing/scabbed Small amt of swelling/hematoma   Minimally tender   Neurological:     Mental Status: She is alert.     Cranial Nerves: Cranial nerves 2-12 are intact. No cranial nerve deficit, dysarthria or facial asymmetry.     Sensory: No sensory deficit.     Motor: Abnormal muscle tone present. No weakness, tremor, atrophy or seizure activity.     Coordination: Romberg sign negative. Coordination normal.     Deep Tendon Reflexes: Reflexes are normal and symmetric. Reflexes normal.  Psychiatric:        Mood and Affect: Mood normal.           Assessment & Plan:   Problem List Items Addressed This Visit       Musculoskeletal and Integument   Osteoporosis  Interested in treatment Reviewed dexa Discussed fall prevention, supplements and exercise for bone density  Handout given   Trial of alendronate weekly  Discussed way to take  Opt 5 y if tolerated  Will call if side effects   Dexa in 2 years         Relevant Medications   alendronate (FOSAMAX) 70 MG tablet     Other   Felton Rasmussen when hit by sliding door-hit head on trash can Reviewed hospital records, lab results and studies in detail   Overall reassuring exam and recovery  Discussed fall prevention in detail       Contusion of hip and thigh   Left After fall With hematoma  Reassuring exam -getting better Tylenol  prn  Ice prn   Call back and Er precautions noted in detail today        Closed head injury - Primary   After a fall on 10/21 Struck head on trash can / small laceration /healing well / tetanus updated Reviewed hospital records, lab results and studies in detail   Reassuring CT head and cs   Had headache-now much improved  No other concussion symptoms   Reassuring exam/neurologically intact  Reviewed signs and symptoms of concussion See AVS Handout given Discussed wound care Call back and Er precautions noted in detail today    If any worsening -would Ct again to r/o subdural

## 2024-09-24 NOTE — Assessment & Plan Note (Signed)
 After a fall on 10/21 Struck head on trash can / small laceration /healing well / tetanus updated Reviewed hospital records, lab results and studies in detail   Reassuring CT head and cs   Had headache-now much improved  No other concussion symptoms   Reassuring exam/neurologically intact  Reviewed signs and symptoms of concussion See AVS Handout given Discussed wound care Call back and Er precautions noted in detail today    If any worsening -would Ct again to r/o subdural

## 2024-09-24 NOTE — Assessment & Plan Note (Signed)
 Left After fall With hematoma  Reassuring exam -getting better Tylenol  prn  Ice prn   Call back and Er precautions noted in detail today

## 2024-09-28 ENCOUNTER — Other Ambulatory Visit: Payer: Self-pay | Admitting: Family Medicine

## 2024-09-28 NOTE — Telephone Encounter (Signed)
 Copied from CRM [id number]. Topic: Clinical - Medication Question >> Sep 28, 2024  2:49 PM Thersia C wrote: Reason for CRM: Patient called in regarding prescription  diltiazem  (CARDIZEM  CD) 120 MG 24 hr capsule inform her that we had received the refill, patient stated she needs it by tomorrow, I did inform her of the 3 business day timeframe but she wanted to know if there was anyway she could get it by tomorrow
